# Patient Record
Sex: Female | Born: 1945 | Race: Black or African American | Hispanic: No | Smoking: Never smoker
Health system: Southern US, Community
[De-identification: ages and names within clinical notes are randomized; demographics above are authoritative.]

## PROBLEM LIST (undated history)

## (undated) DIAGNOSIS — Z86010 Personal history of colonic polyps: Secondary | ICD-10-CM

## (undated) DIAGNOSIS — K922 Gastrointestinal hemorrhage, unspecified: Secondary | ICD-10-CM

## (undated) DIAGNOSIS — J9801 Acute bronchospasm: Secondary | ICD-10-CM

## (undated) DIAGNOSIS — M25519 Pain in unspecified shoulder: Secondary | ICD-10-CM

## (undated) DIAGNOSIS — J0130 Acute sphenoidal sinusitis, unspecified: Secondary | ICD-10-CM

## (undated) DIAGNOSIS — R011 Cardiac murmur, unspecified: Secondary | ICD-10-CM

## (undated) DIAGNOSIS — R945 Abnormal results of liver function studies: Secondary | ICD-10-CM

## (undated) DIAGNOSIS — I10 Essential (primary) hypertension: Secondary | ICD-10-CM

## (undated) DIAGNOSIS — R946 Abnormal results of thyroid function studies: Secondary | ICD-10-CM

## (undated) DIAGNOSIS — L910 Hypertrophic scar: Secondary | ICD-10-CM

## (undated) DIAGNOSIS — R1011 Right upper quadrant pain: Secondary | ICD-10-CM

## (undated) DIAGNOSIS — E119 Type 2 diabetes mellitus without complications: Secondary | ICD-10-CM

## (undated) DIAGNOSIS — L089 Local infection of the skin and subcutaneous tissue, unspecified: Secondary | ICD-10-CM

## (undated) DIAGNOSIS — M7989 Other specified soft tissue disorders: Secondary | ICD-10-CM

## (undated) DIAGNOSIS — G4733 Obstructive sleep apnea (adult) (pediatric): Secondary | ICD-10-CM

## (undated) DIAGNOSIS — M1990 Unspecified osteoarthritis, unspecified site: Secondary | ICD-10-CM

## (undated) DIAGNOSIS — K8071 Calculus of gallbladder and bile duct without cholecystitis with obstruction: Secondary | ICD-10-CM

## (undated) DIAGNOSIS — E782 Mixed hyperlipidemia: Secondary | ICD-10-CM

## (undated) DIAGNOSIS — G4730 Sleep apnea, unspecified: Secondary | ICD-10-CM

## (undated) DIAGNOSIS — I517 Cardiomegaly: Secondary | ICD-10-CM

## (undated) DIAGNOSIS — K3533 Acute appendicitis with perforation and localized peritonitis, with abscess: Secondary | ICD-10-CM

## (undated) DIAGNOSIS — E669 Obesity, unspecified: Secondary | ICD-10-CM

## (undated) HISTORY — DX: Pain in unspecified shoulder: M25.519

## (undated) HISTORY — DX: Acute appendicitis with perforation and localized peritonitis, with abscess: K35.33

## (undated) HISTORY — DX: Mixed hyperlipidemia: E78.2

## (undated) HISTORY — DX: Local infection of the skin and subcutaneous tissue, unspecified: L08.9

## (undated) HISTORY — DX: Gastrointestinal hemorrhage, unspecified: K92.2

## (undated) HISTORY — DX: Cardiomegaly: I51.7

## (undated) HISTORY — DX: Obesity, unspecified: E66.9

## (undated) HISTORY — DX: Obstructive sleep apnea (adult) (pediatric): G47.33

## (undated) HISTORY — DX: Right upper quadrant pain: R10.11

## (undated) HISTORY — DX: Acute bronchospasm: J98.01

## (undated) HISTORY — DX: Other specified soft tissue disorders: M79.89

## (undated) HISTORY — DX: Acute sphenoidal sinusitis, unspecified: J01.30

## (undated) HISTORY — DX: Calculus of gallbladder and bile duct without cholecystitis with obstruction: K80.71

## (undated) HISTORY — PX: OTHER SURGICAL HISTORY: SHX169

## (undated) HISTORY — DX: Personal history of colonic polyps: Z86.010

## (undated) HISTORY — DX: Unspecified osteoarthritis, unspecified site: M19.90

## (undated) HISTORY — PX: EYE SURGERY: SHX253

## (undated) HISTORY — DX: Type 2 diabetes mellitus without complications: E11.9

## (undated) HISTORY — DX: Essential (primary) hypertension: I10

## (undated) HISTORY — DX: Sleep apnea, unspecified: G47.30

## (undated) HISTORY — DX: Abnormal results of liver function studies: R94.5

## (undated) HISTORY — PX: ABDOMINAL HYSTERECTOMY: SHX81

## (undated) HISTORY — DX: Abnormal results of thyroid function studies: R94.6

## (undated) HISTORY — DX: Morbid (severe) obesity due to excess calories: E66.01

## (undated) HISTORY — DX: Hypertrophic scar: L91.0

## (undated) HISTORY — DX: Cardiac murmur, unspecified: R01.1

---

## 1993-09-10 ENCOUNTER — Encounter: Payer: Self-pay | Admitting: Pulmonary Disease

## 1998-11-16 ENCOUNTER — Encounter: Admission: RE | Admit: 1998-11-16 | Discharge: 1999-02-14 | Payer: Self-pay | Admitting: Internal Medicine

## 1998-11-20 ENCOUNTER — Other Ambulatory Visit: Admission: RE | Admit: 1998-11-20 | Discharge: 1998-11-20 | Payer: Self-pay | Admitting: Internal Medicine

## 1998-11-30 ENCOUNTER — Encounter: Payer: Self-pay | Admitting: Pulmonary Disease

## 1999-01-08 ENCOUNTER — Ambulatory Visit: Admission: RE | Admit: 1999-01-08 | Discharge: 1999-01-08 | Payer: Self-pay | Admitting: Pulmonary Disease

## 1999-01-08 ENCOUNTER — Encounter: Payer: Self-pay | Admitting: Pulmonary Disease

## 1999-02-06 ENCOUNTER — Ambulatory Visit (HOSPITAL_COMMUNITY): Admission: RE | Admit: 1999-02-06 | Discharge: 1999-02-06 | Payer: Self-pay | Admitting: Gastroenterology

## 2000-11-04 ENCOUNTER — Encounter: Admission: RE | Admit: 2000-11-04 | Discharge: 2001-02-02 | Payer: Self-pay | Admitting: Internal Medicine

## 2000-12-16 ENCOUNTER — Encounter: Payer: Self-pay | Admitting: General Surgery

## 2000-12-16 ENCOUNTER — Inpatient Hospital Stay (HOSPITAL_COMMUNITY): Admission: EM | Admit: 2000-12-16 | Discharge: 2000-12-18 | Payer: Self-pay

## 2000-12-17 ENCOUNTER — Encounter: Payer: Self-pay | Admitting: General Surgery

## 2001-02-15 LAB — HM MAMMOGRAPHY

## 2005-02-04 ENCOUNTER — Ambulatory Visit: Payer: Self-pay | Admitting: Internal Medicine

## 2005-02-14 ENCOUNTER — Ambulatory Visit: Payer: Self-pay | Admitting: Internal Medicine

## 2005-03-03 ENCOUNTER — Ambulatory Visit: Payer: Self-pay | Admitting: Internal Medicine

## 2005-04-28 ENCOUNTER — Ambulatory Visit: Payer: Self-pay | Admitting: Internal Medicine

## 2005-05-13 ENCOUNTER — Ambulatory Visit: Payer: Self-pay

## 2005-05-13 ENCOUNTER — Encounter: Payer: Self-pay | Admitting: Cardiology

## 2005-07-15 ENCOUNTER — Ambulatory Visit: Payer: Self-pay | Admitting: Internal Medicine

## 2005-08-25 ENCOUNTER — Ambulatory Visit: Payer: Self-pay | Admitting: Internal Medicine

## 2006-06-28 LAB — HM COLONOSCOPY

## 2006-09-04 ENCOUNTER — Telehealth: Payer: Self-pay | Admitting: Internal Medicine

## 2006-10-07 ENCOUNTER — Telehealth: Payer: Self-pay | Admitting: Internal Medicine

## 2006-12-01 ENCOUNTER — Ambulatory Visit: Payer: Self-pay | Admitting: Internal Medicine

## 2006-12-01 LAB — CONVERTED CEMR LAB
ALT: 17 units/L (ref 0–35)
AST: 15 units/L (ref 0–37)
Basophils Relative: 0.3 % (ref 0.0–1.0)
Bilirubin, Direct: 0.2 mg/dL (ref 0.0–0.3)
CO2: 26 meq/L (ref 19–32)
Calcium: 9.4 mg/dL (ref 8.4–10.5)
Chloride: 106 meq/L (ref 96–112)
Eosinophils Relative: 3.2 % (ref 0.0–5.0)
Glucose, Bld: 126 mg/dL — ABNORMAL HIGH (ref 70–99)
HCT: 37.2 % (ref 36.0–46.0)
Hgb A1c MFr Bld: 6.4 % — ABNORMAL HIGH (ref 4.6–6.0)
Lymphocytes Relative: 27.3 % (ref 12.0–46.0)
Neutrophils Relative %: 61.4 % (ref 43.0–77.0)
Nitrite: NEGATIVE
Platelets: 172 10*3/uL (ref 150–400)
RBC: 4.14 M/uL (ref 3.87–5.11)
Specific Gravity, Urine: 1.025
Total Bilirubin: 0.7 mg/dL (ref 0.3–1.2)
Total CHOL/HDL Ratio: 2.8
Total Protein: 6.7 g/dL (ref 6.0–8.3)
Triglycerides: 117 mg/dL (ref 0–149)
VLDL: 23 mg/dL (ref 0–40)
WBC Urine, dipstick: NEGATIVE
WBC: 6.4 10*3/uL (ref 4.5–10.5)

## 2006-12-08 ENCOUNTER — Ambulatory Visit: Payer: Self-pay | Admitting: Internal Medicine

## 2006-12-08 DIAGNOSIS — E669 Obesity, unspecified: Secondary | ICD-10-CM | POA: Insufficient documentation

## 2006-12-08 DIAGNOSIS — I10 Essential (primary) hypertension: Secondary | ICD-10-CM | POA: Insufficient documentation

## 2006-12-08 DIAGNOSIS — M1990 Unspecified osteoarthritis, unspecified site: Secondary | ICD-10-CM | POA: Insufficient documentation

## 2006-12-08 DIAGNOSIS — Z860100 Personal history of colon polyps, unspecified: Secondary | ICD-10-CM | POA: Insufficient documentation

## 2006-12-08 DIAGNOSIS — Z9989 Dependence on other enabling machines and devices: Secondary | ICD-10-CM

## 2006-12-08 DIAGNOSIS — G4733 Obstructive sleep apnea (adult) (pediatric): Secondary | ICD-10-CM | POA: Insufficient documentation

## 2006-12-08 DIAGNOSIS — E782 Mixed hyperlipidemia: Secondary | ICD-10-CM

## 2006-12-08 DIAGNOSIS — R946 Abnormal results of thyroid function studies: Secondary | ICD-10-CM

## 2006-12-08 DIAGNOSIS — Z6841 Body Mass Index (BMI) 40.0 and over, adult: Secondary | ICD-10-CM | POA: Insufficient documentation

## 2006-12-08 DIAGNOSIS — R011 Cardiac murmur, unspecified: Secondary | ICD-10-CM

## 2006-12-08 DIAGNOSIS — E1169 Type 2 diabetes mellitus with other specified complication: Secondary | ICD-10-CM | POA: Insufficient documentation

## 2006-12-08 DIAGNOSIS — E119 Type 2 diabetes mellitus without complications: Secondary | ICD-10-CM

## 2006-12-08 HISTORY — DX: Type 2 diabetes mellitus without complications: E11.9

## 2006-12-08 HISTORY — DX: Mixed hyperlipidemia: E78.2

## 2006-12-08 HISTORY — DX: Essential (primary) hypertension: I10

## 2006-12-08 HISTORY — DX: Personal history of colonic polyps: Z86.010

## 2006-12-08 HISTORY — DX: Abnormal results of thyroid function studies: R94.6

## 2006-12-08 HISTORY — DX: Obstructive sleep apnea (adult) (pediatric): G47.33

## 2006-12-08 HISTORY — DX: Cardiac murmur, unspecified: R01.1

## 2006-12-08 HISTORY — DX: Morbid (severe) obesity due to excess calories: E66.01

## 2006-12-08 HISTORY — DX: Unspecified osteoarthritis, unspecified site: M19.90

## 2006-12-08 HISTORY — DX: Personal history of colon polyps, unspecified: Z86.0100

## 2006-12-18 ENCOUNTER — Encounter: Payer: Self-pay | Admitting: Internal Medicine

## 2007-04-05 ENCOUNTER — Ambulatory Visit: Payer: Self-pay | Admitting: Internal Medicine

## 2007-04-08 LAB — CONVERTED CEMR LAB
Free T4: 0.8 ng/dL (ref 0.6–1.6)
TSH: 3.58 microintl units/mL (ref 0.35–5.50)
Thyroglobulin Ab: <30 (ref 0.0–60.0)
Thyroperoxidase Ab SerPl-aCnc: 4537.9 — ABNORMAL HIGH (ref 0.0–60.0)

## 2007-08-31 ENCOUNTER — Encounter: Payer: Self-pay | Admitting: Internal Medicine

## 2007-09-29 ENCOUNTER — Encounter: Payer: Self-pay | Admitting: Internal Medicine

## 2008-02-07 ENCOUNTER — Ambulatory Visit: Payer: Self-pay | Admitting: Internal Medicine

## 2008-02-07 DIAGNOSIS — M25519 Pain in unspecified shoulder: Secondary | ICD-10-CM

## 2008-02-07 HISTORY — DX: Pain in unspecified shoulder: M25.519

## 2008-03-07 ENCOUNTER — Telehealth: Payer: Self-pay | Admitting: *Deleted

## 2008-03-28 ENCOUNTER — Encounter: Payer: Self-pay | Admitting: Internal Medicine

## 2008-04-19 ENCOUNTER — Ambulatory Visit: Payer: Self-pay | Admitting: Pulmonary Disease

## 2008-04-24 ENCOUNTER — Telehealth: Payer: Self-pay | Admitting: *Deleted

## 2008-06-16 ENCOUNTER — Inpatient Hospital Stay (HOSPITAL_COMMUNITY): Admission: EM | Admit: 2008-06-16 | Discharge: 2008-06-23 | Payer: Self-pay | Admitting: Emergency Medicine

## 2008-06-16 ENCOUNTER — Ambulatory Visit: Payer: Self-pay | Admitting: Internal Medicine

## 2008-06-16 ENCOUNTER — Ambulatory Visit: Payer: Self-pay | Admitting: Family Medicine

## 2008-06-16 DIAGNOSIS — R1011 Right upper quadrant pain: Secondary | ICD-10-CM | POA: Insufficient documentation

## 2008-06-16 HISTORY — DX: Right upper quadrant pain: R10.11

## 2008-06-16 LAB — CONVERTED CEMR LAB
Glucose, Urine, Semiquant: NEGATIVE
Protein, U semiquant: NEGATIVE
WBC Urine, dipstick: NEGATIVE
pH: 6.5

## 2008-06-18 ENCOUNTER — Encounter: Payer: Self-pay | Admitting: Internal Medicine

## 2008-06-19 ENCOUNTER — Encounter (INDEPENDENT_AMBULATORY_CARE_PROVIDER_SITE_OTHER): Payer: Self-pay | Admitting: Surgery

## 2008-06-27 HISTORY — PX: CHOLECYSTECTOMY: SHX55

## 2008-07-05 ENCOUNTER — Encounter: Payer: Self-pay | Admitting: Internal Medicine

## 2008-07-05 ENCOUNTER — Encounter: Payer: Self-pay | Admitting: *Deleted

## 2008-07-05 LAB — CONVERTED CEMR LAB
Bilirubin, Direct: 0.4 mg/dL
Total Bilirubin: 0.8 mg/dL

## 2008-07-14 ENCOUNTER — Ambulatory Visit (HOSPITAL_COMMUNITY): Admission: RE | Admit: 2008-07-14 | Discharge: 2008-07-14 | Payer: Self-pay | Admitting: Gastroenterology

## 2008-07-14 ENCOUNTER — Encounter: Payer: Self-pay | Admitting: Internal Medicine

## 2008-07-26 ENCOUNTER — Encounter: Payer: Self-pay | Admitting: Internal Medicine

## 2008-07-28 ENCOUNTER — Ambulatory Visit: Payer: Self-pay | Admitting: Internal Medicine

## 2008-07-28 ENCOUNTER — Ambulatory Visit: Payer: Self-pay

## 2008-07-28 DIAGNOSIS — M7989 Other specified soft tissue disorders: Secondary | ICD-10-CM

## 2008-07-28 DIAGNOSIS — R945 Abnormal results of liver function studies: Secondary | ICD-10-CM

## 2008-07-28 DIAGNOSIS — L089 Local infection of the skin and subcutaneous tissue, unspecified: Secondary | ICD-10-CM | POA: Insufficient documentation

## 2008-07-28 HISTORY — DX: Local infection of the skin and subcutaneous tissue, unspecified: L08.9

## 2008-07-28 HISTORY — DX: Other specified soft tissue disorders: M79.89

## 2008-07-28 HISTORY — DX: Abnormal results of liver function studies: R94.5

## 2008-08-01 ENCOUNTER — Telehealth (INDEPENDENT_AMBULATORY_CARE_PROVIDER_SITE_OTHER): Payer: Self-pay | Admitting: *Deleted

## 2008-08-10 ENCOUNTER — Encounter: Payer: Self-pay | Admitting: Internal Medicine

## 2008-08-11 ENCOUNTER — Encounter: Payer: Self-pay | Admitting: *Deleted

## 2008-08-15 ENCOUNTER — Telehealth (INDEPENDENT_AMBULATORY_CARE_PROVIDER_SITE_OTHER): Payer: Self-pay | Admitting: *Deleted

## 2008-09-28 ENCOUNTER — Ambulatory Visit: Payer: Self-pay | Admitting: Internal Medicine

## 2008-09-28 LAB — CONVERTED CEMR LAB
Bilirubin, Direct: 0 mg/dL (ref 0.0–0.3)
LDL Cholesterol: 42 mg/dL (ref 0–99)
TSH: 4.72 microintl units/mL (ref 0.35–5.50)
Total Bilirubin: 0.9 mg/dL (ref 0.3–1.2)
Total CHOL/HDL Ratio: 2
VLDL: 20.4 mg/dL (ref 0.0–40.0)

## 2008-10-05 ENCOUNTER — Ambulatory Visit: Payer: Self-pay | Admitting: Internal Medicine

## 2008-10-05 DIAGNOSIS — L910 Hypertrophic scar: Secondary | ICD-10-CM | POA: Insufficient documentation

## 2008-10-05 HISTORY — DX: Hypertrophic scar: L91.0

## 2009-02-07 ENCOUNTER — Telehealth: Payer: Self-pay | Admitting: *Deleted

## 2009-02-08 ENCOUNTER — Ambulatory Visit: Payer: Self-pay | Admitting: Internal Medicine

## 2009-02-08 LAB — CONVERTED CEMR LAB
CO2: 29 meq/L (ref 19–32)
Calcium: 9.2 mg/dL (ref 8.4–10.5)
Chloride: 98 meq/L (ref 96–112)
Free T4: 0.8 ng/dL (ref 0.6–1.6)
Hgb A1c MFr Bld: 6.7 % — ABNORMAL HIGH (ref 4.6–6.5)
Sodium: 137 meq/L (ref 135–145)
TSH: 4.58 microintl units/mL (ref 0.35–5.50)

## 2009-02-15 ENCOUNTER — Ambulatory Visit: Payer: Self-pay | Admitting: Internal Medicine

## 2009-04-18 ENCOUNTER — Telehealth: Payer: Self-pay | Admitting: *Deleted

## 2009-05-01 ENCOUNTER — Ambulatory Visit: Payer: Self-pay | Admitting: Pulmonary Disease

## 2009-05-03 ENCOUNTER — Ambulatory Visit: Payer: Self-pay | Admitting: Internal Medicine

## 2009-05-10 ENCOUNTER — Ambulatory Visit: Payer: Self-pay | Admitting: Internal Medicine

## 2009-06-07 ENCOUNTER — Encounter: Payer: Self-pay | Admitting: Internal Medicine

## 2009-06-15 ENCOUNTER — Telehealth: Payer: Self-pay | Admitting: *Deleted

## 2009-08-02 ENCOUNTER — Ambulatory Visit: Payer: Self-pay | Admitting: Internal Medicine

## 2009-08-02 ENCOUNTER — Telehealth: Payer: Self-pay | Admitting: Internal Medicine

## 2009-08-02 LAB — CONVERTED CEMR LAB
Hgb A1c MFr Bld: 6.8 % — ABNORMAL HIGH (ref 4.6–6.5)
Microalb, Ur: 0.4 mg/dL (ref 0.0–1.9)

## 2009-08-24 ENCOUNTER — Ambulatory Visit: Payer: Self-pay | Admitting: Internal Medicine

## 2009-08-24 DIAGNOSIS — J9801 Acute bronchospasm: Secondary | ICD-10-CM | POA: Insufficient documentation

## 2009-08-24 HISTORY — DX: Acute bronchospasm: J98.01

## 2009-08-29 ENCOUNTER — Telehealth: Payer: Self-pay | Admitting: *Deleted

## 2009-08-31 ENCOUNTER — Telehealth: Payer: Self-pay | Admitting: Internal Medicine

## 2009-09-24 ENCOUNTER — Ambulatory Visit: Payer: Self-pay | Admitting: Internal Medicine

## 2009-09-24 DIAGNOSIS — E669 Obesity, unspecified: Secondary | ICD-10-CM | POA: Insufficient documentation

## 2009-09-24 HISTORY — DX: Obesity, unspecified: E66.9

## 2009-09-27 ENCOUNTER — Ambulatory Visit: Payer: Self-pay | Admitting: Internal Medicine

## 2009-09-27 IMAGING — RF DG ERCP WO/W SPHINCTEROTOMY
1 series · 9 of 9 positions shown · IV contrast (agent unspecified)
Comparison: Operative cholangiogram 06/19/2008

CLINICAL DATA: Possible common bile duct stones

ERCP
Fluoroscopy Time: Not recorded
Contrast: Unknown

[Series 1: run · 9 of 9 slices shown]
[im 1/9]
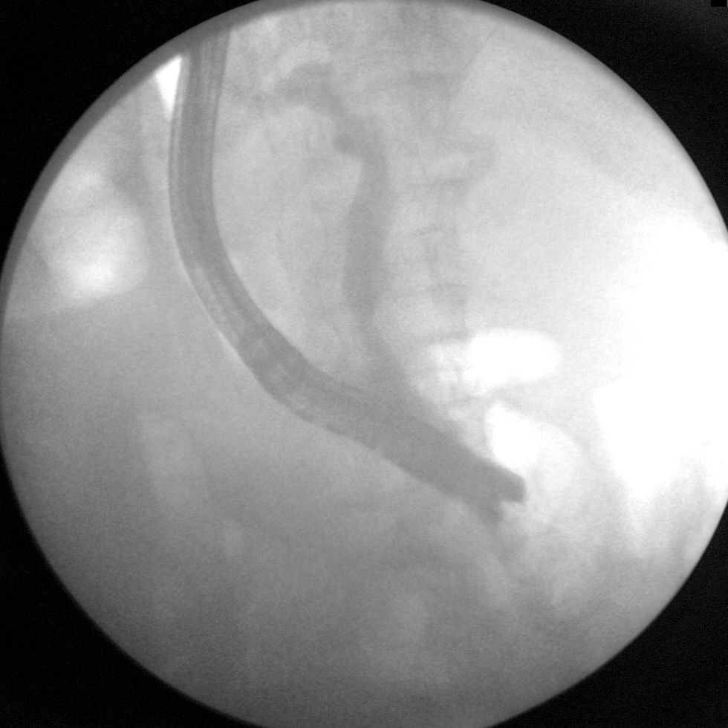
[im 2/9]
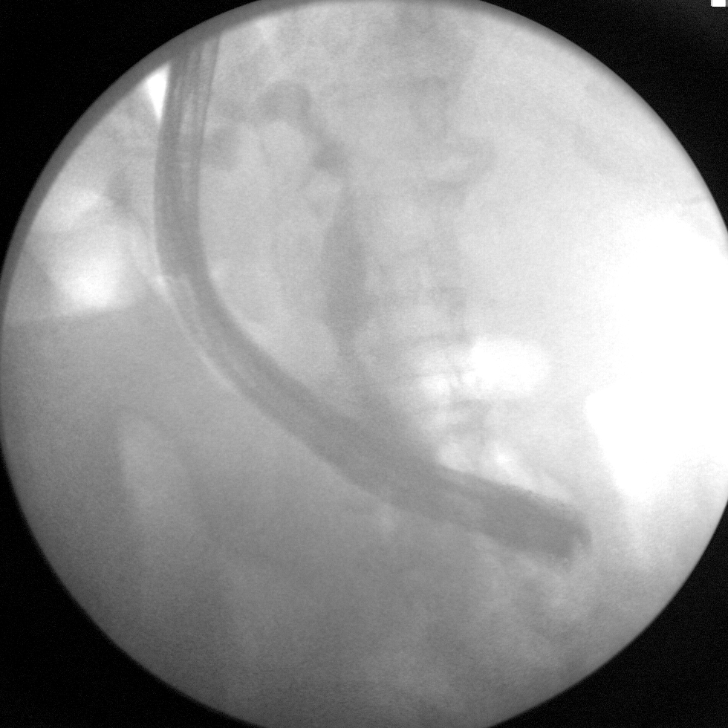
[im 3/9]
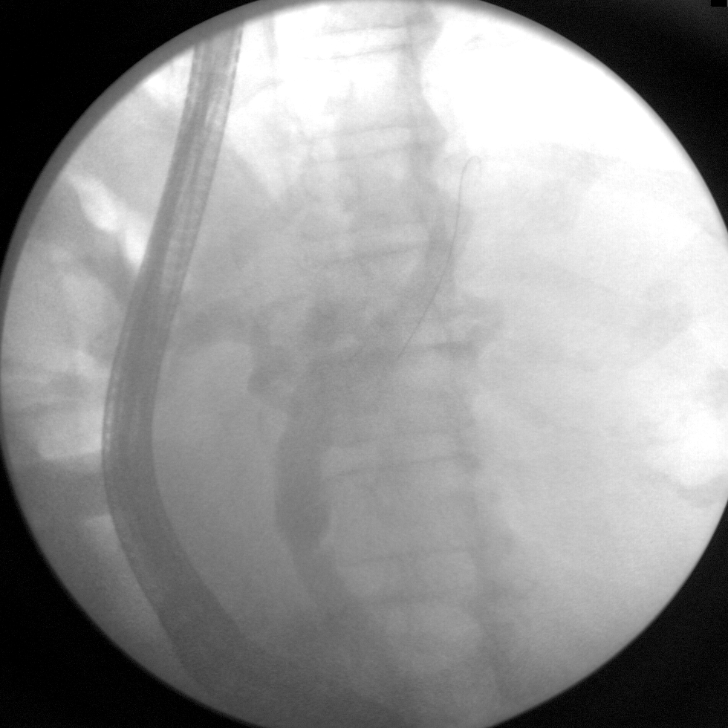
[im 4/9]
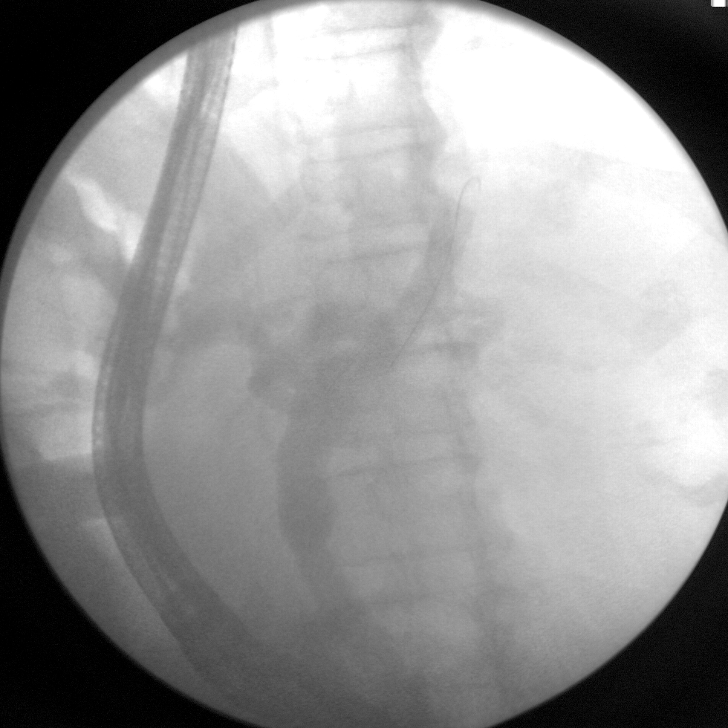
[im 5/9]
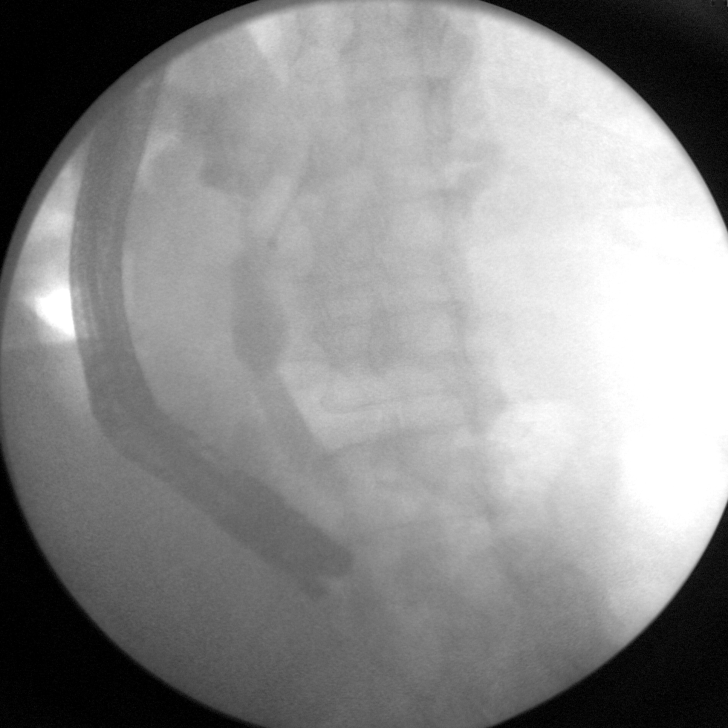
[im 6/9]
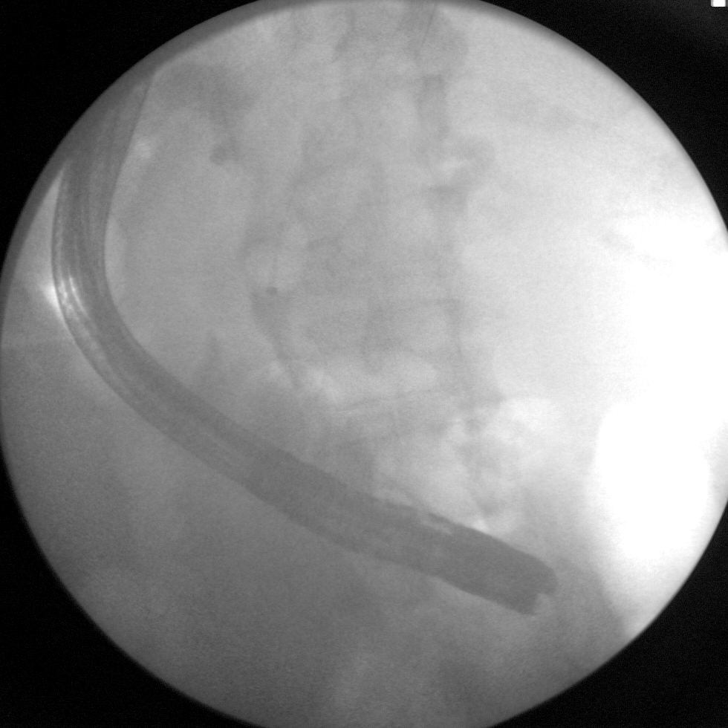
[im 7/9]
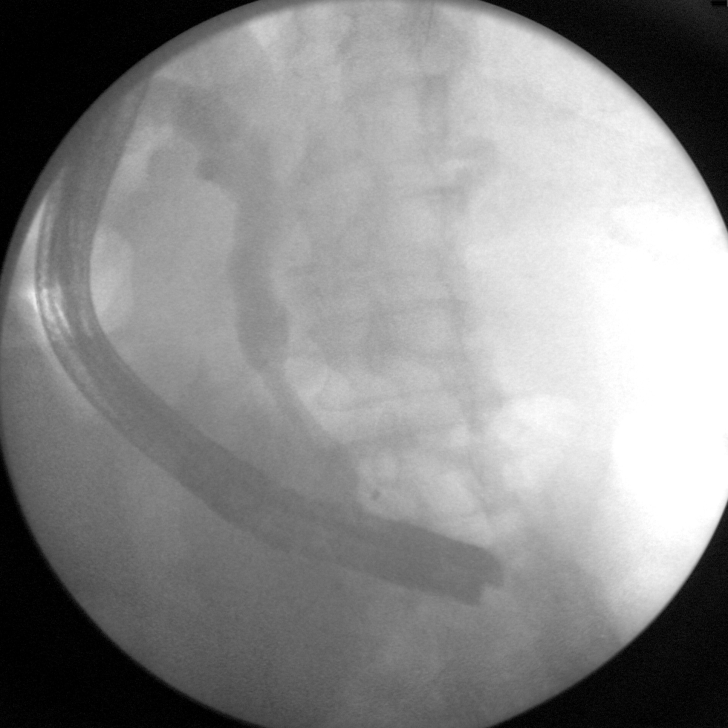
[im 8/9]
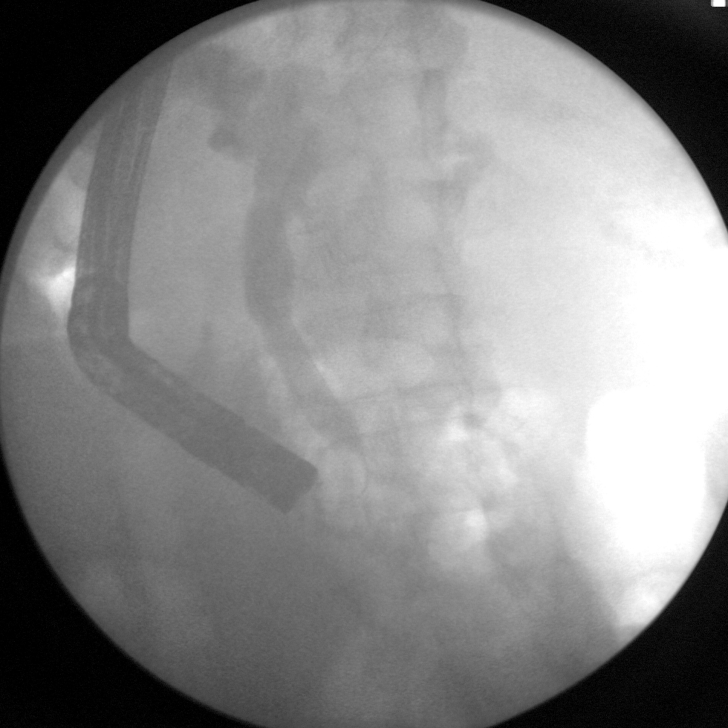
[im 9/9]
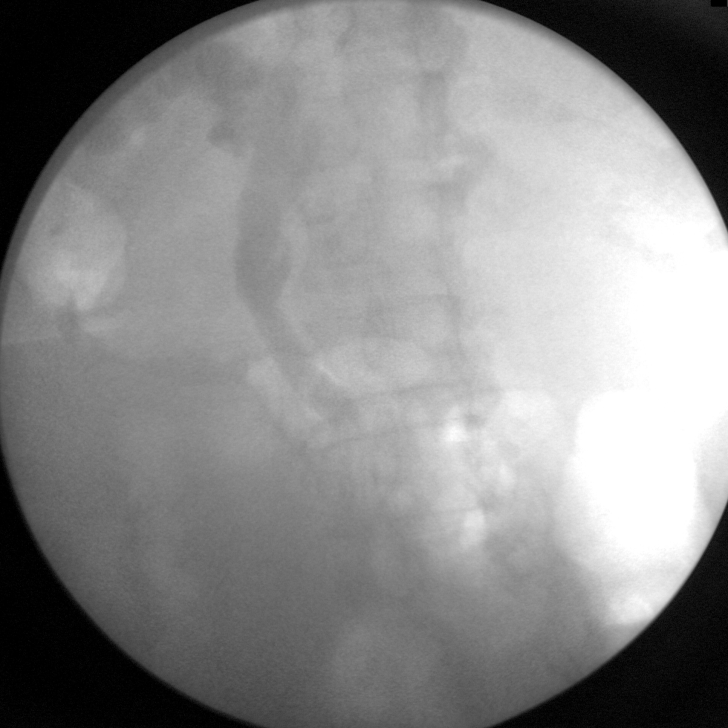

[9 of 9 positions shown; findings below may reference images not displayed]

FINDINGS: Fluoroscopy was provided for endoscopy with
sphincterotomy and balloon sweeping of the duct.  A radiologist was
not present at the time of the exam.
IMPRESSION: ERCP with sphincterotomy and common bile duct sweeping.

## 2009-10-02 ENCOUNTER — Ambulatory Visit (HOSPITAL_COMMUNITY): Admission: RE | Admit: 2009-10-02 | Discharge: 2009-10-02 | Payer: Self-pay | Admitting: Internal Medicine

## 2009-10-02 ENCOUNTER — Ambulatory Visit: Payer: Self-pay

## 2009-10-02 ENCOUNTER — Encounter: Payer: Self-pay | Admitting: Internal Medicine

## 2009-10-02 ENCOUNTER — Ambulatory Visit: Payer: Self-pay | Admitting: Internal Medicine

## 2009-11-20 ENCOUNTER — Ambulatory Visit: Payer: Self-pay | Admitting: Family Medicine

## 2009-11-30 ENCOUNTER — Telehealth: Payer: Self-pay | Admitting: *Deleted

## 2010-01-18 ENCOUNTER — Telehealth: Payer: Self-pay | Admitting: *Deleted

## 2010-01-31 ENCOUNTER — Encounter: Payer: Self-pay | Admitting: Internal Medicine

## 2010-01-31 DIAGNOSIS — I517 Cardiomegaly: Secondary | ICD-10-CM

## 2010-01-31 HISTORY — DX: Cardiomegaly: I51.7

## 2010-02-01 ENCOUNTER — Ambulatory Visit
Admission: RE | Admit: 2010-02-01 | Discharge: 2010-02-01 | Payer: Self-pay | Source: Home / Self Care | Attending: Internal Medicine | Admitting: Internal Medicine

## 2010-02-01 ENCOUNTER — Other Ambulatory Visit: Payer: Self-pay | Admitting: Internal Medicine

## 2010-02-01 LAB — HEPATIC FUNCTION PANEL
ALT: 122 U/L — ABNORMAL HIGH (ref 0–35)
AST: 50 U/L — ABNORMAL HIGH (ref 0–37)
Albumin: 3.3 g/dL — ABNORMAL LOW (ref 3.5–5.2)
Alkaline Phosphatase: 220 U/L — ABNORMAL HIGH (ref 39–117)
Bilirubin, Direct: 0.7 mg/dL — ABNORMAL HIGH (ref 0.0–0.3)
Total Bilirubin: 1.5 mg/dL — ABNORMAL HIGH (ref 0.3–1.2)
Total Protein: 6.5 g/dL (ref 6.0–8.3)

## 2010-02-01 LAB — BASIC METABOLIC PANEL
BUN: 18 mg/dL (ref 6–23)
CO2: 30 mEq/L (ref 19–32)
Calcium: 9.3 mg/dL (ref 8.4–10.5)
Chloride: 105 mEq/L (ref 96–112)
Creatinine, Ser: 1.1 mg/dL (ref 0.4–1.2)
GFR: 65.82 mL/min (ref >60.00)
Glucose, Bld: 142 mg/dL — ABNORMAL HIGH (ref 70–99)
Potassium: 3.4 mEq/L — ABNORMAL LOW (ref 3.5–5.1)
Sodium: 142 mEq/L (ref 135–145)

## 2010-02-01 LAB — HEMOGLOBIN A1C: Hgb A1c MFr Bld: 6.8 % — ABNORMAL HIGH (ref 4.6–6.5)

## 2010-02-01 LAB — LIPID PANEL
Cholesterol: 128 mg/dL (ref 0–200)
HDL: 44 mg/dL (ref >39.00)
LDL Cholesterol: 64 mg/dL (ref 0–99)
Total CHOL/HDL Ratio: 3
Triglycerides: 100 mg/dL (ref 0.0–149.0)
VLDL: 20 mg/dL (ref 0.0–40.0)

## 2010-02-01 LAB — TSH: TSH: 6.17 u[IU]/mL — ABNORMAL HIGH (ref 0.35–5.50)

## 2010-02-11 ENCOUNTER — Ambulatory Visit
Admission: RE | Admit: 2010-02-11 | Discharge: 2010-02-11 | Payer: Self-pay | Source: Home / Self Care | Attending: Internal Medicine | Admitting: Internal Medicine

## 2010-02-19 ENCOUNTER — Emergency Department (HOSPITAL_COMMUNITY)
Admission: EM | Admit: 2010-02-19 | Discharge: 2010-02-19 | Payer: Self-pay | Source: Home / Self Care | Admitting: Emergency Medicine

## 2010-02-19 ENCOUNTER — Telehealth: Payer: Self-pay | Admitting: *Deleted

## 2010-02-19 ENCOUNTER — Telehealth: Payer: Self-pay | Admitting: Internal Medicine

## 2010-02-20 ENCOUNTER — Telehealth: Payer: Self-pay | Admitting: Internal Medicine

## 2010-02-20 ENCOUNTER — Ambulatory Visit: Admit: 2010-02-20 | Payer: Self-pay | Admitting: Internal Medicine

## 2010-02-20 LAB — URINE MICROSCOPIC-ADD ON

## 2010-02-20 LAB — DIFFERENTIAL
Eosinophils Absolute: 0 10*3/uL (ref 0.0–0.7)
Lymphs Abs: 0.9 10*3/uL (ref 0.7–4.0)
Monocytes Relative: 5 % (ref 3–12)
Neutrophils Relative %: 83 % — ABNORMAL HIGH (ref 43–77)

## 2010-02-20 LAB — HEPATIC FUNCTION PANEL
ALT: 138 U/L — ABNORMAL HIGH (ref 0–35)
Alkaline Phosphatase: 358 U/L — ABNORMAL HIGH (ref 39–117)
Indirect Bilirubin: 2 mg/dL — ABNORMAL HIGH (ref 0.3–0.9)
Total Bilirubin: 4.1 mg/dL — ABNORMAL HIGH (ref 0.3–1.2)
Total Protein: 7.1 g/dL (ref 6.0–8.3)

## 2010-02-20 LAB — COMPREHENSIVE METABOLIC PANEL
ALT: 138 U/L — ABNORMAL HIGH (ref 0–35)
AST: 67 U/L — ABNORMAL HIGH (ref 0–37)
Albumin: 3.1 g/dL — ABNORMAL LOW (ref 3.5–5.2)
Alkaline Phosphatase: 355 U/L — ABNORMAL HIGH (ref 39–117)
Chloride: 105 mEq/L (ref 96–112)
GFR calc Af Amer: >60 mL/min (ref >60)
Potassium: 3.4 mEq/L — ABNORMAL LOW (ref 3.5–5.1)
Total Bilirubin: 4.3 mg/dL — ABNORMAL HIGH (ref 0.3–1.2)

## 2010-02-20 LAB — GAMMA GT: GGT: 451 U/L — ABNORMAL HIGH (ref 7–51)

## 2010-02-20 LAB — URINALYSIS, ROUTINE W REFLEX MICROSCOPIC
Hgb urine dipstick: NEGATIVE
Protein, ur: 30 mg/dL — AB
Urobilinogen, UA: 1 mg/dL (ref 0.0–1.0)

## 2010-02-20 LAB — CBC
MCH: 29 pg (ref 26.0–34.0)
MCV: 88.1 fL (ref 78.0–100.0)
Platelets: 159 10*3/uL (ref 150–400)
RBC: 4.21 MIL/uL (ref 3.87–5.11)

## 2010-02-20 LAB — POCT CARDIAC MARKERS: Troponin i, poc: <0.05 ng/mL (ref 0.00–0.09)

## 2010-02-21 ENCOUNTER — Encounter: Payer: Self-pay | Admitting: Internal Medicine

## 2010-02-24 LAB — CONVERTED CEMR LAB
AST: 24 units/L (ref 0–37)
Albumin: 3.6 g/dL (ref 3.5–5.2)
Creatinine,U: 96.1 mg/dL
Eosinophils Relative: 6.3 % — ABNORMAL HIGH (ref 0.0–5.0)
HCT: 35.3 % — ABNORMAL LOW (ref 36.0–46.0)
Lymphs Abs: 1.7 10*3/uL (ref 0.7–4.0)
Microalb Creat Ratio: 3.1 mg/g (ref 0.0–30.0)
Microalb, Ur: 0.3 mg/dL (ref 0.0–1.9)
Monocytes Relative: 10 % (ref 3.0–12.0)
Neutrophils Relative %: 44.7 % (ref 43.0–77.0)
Platelets: 156 10*3/uL (ref 150.0–400.0)
WBC: 4.5 10*3/uL (ref 4.5–10.5)

## 2010-02-25 ENCOUNTER — Encounter: Payer: Self-pay | Admitting: Gastroenterology

## 2010-02-26 ENCOUNTER — Other Ambulatory Visit: Payer: Self-pay | Admitting: Internal Medicine

## 2010-02-26 ENCOUNTER — Ambulatory Visit
Admission: RE | Admit: 2010-02-26 | Discharge: 2010-02-26 | Payer: Self-pay | Source: Home / Self Care | Attending: Internal Medicine | Admitting: Internal Medicine

## 2010-02-26 DIAGNOSIS — R42 Dizziness and giddiness: Secondary | ICD-10-CM | POA: Insufficient documentation

## 2010-02-26 DIAGNOSIS — J0130 Acute sphenoidal sinusitis, unspecified: Secondary | ICD-10-CM | POA: Insufficient documentation

## 2010-02-26 HISTORY — DX: Acute sphenoidal sinusitis, unspecified: J01.30

## 2010-02-26 LAB — CONVERTED CEMR LAB
HCV Ab: NEGATIVE
Hep B Core Total Ab: NEGATIVE
Hepatitis B Surface Ag: NEGATIVE

## 2010-02-26 LAB — TSH: TSH: 2.74 u[IU]/mL (ref 0.35–5.50)

## 2010-02-26 LAB — HEPATIC FUNCTION PANEL
AST: 57 U/L — ABNORMAL HIGH (ref 0–37)
Albumin: 3.4 g/dL — ABNORMAL LOW (ref 3.5–5.2)
Total Protein: 7 g/dL (ref 6.0–8.3)

## 2010-02-26 NOTE — Progress Notes (Signed)
Summary: exercise clearance  Phone Note Call from Patient   Caller: Patient Call For: Madelin Headings MD Summary of Call: Pt needs an order written on prescription pad stating she is cleared to participate in Water aerobic, and floor exercise in the gym .  Call when ready, please. [phone] Initial call taken by: Lynann Beaver CMA,  Jun 15, 2009 2:54 PM  Follow-up for Phone Call        ok  Follow-up by: Madelin Headings MD,  Jun 15, 2009 4:51 PM  Additional Follow-up for Phone Call Additional follow up Details #1::        Pt aware that the note is ready to pick up. Additional Follow-up by: Romualdo Bolk, CMA (AAMA),  Jun 15, 2009 4:57 PM

## 2010-02-26 NOTE — Progress Notes (Signed)
Summary: ok to drive myself 5 hr  Phone Note Call from Patient Call back at [facility] Phone [phone]   Summary of Call: Is it okay for me to drive 5hrs to [place], [place] by myself?   Leaving Sun night around 5-6.  Not to be out in daytime because of breathing.   Initial call taken by: Rudy Jew, RN,  August 31, 2009 3:00 PM  Follow-up for Phone Call        if she is doing well    and not having dyspnea.   Follow-up by: Madelin Headings MD,  August 31, 2009 3:48 PM  Additional Follow-up for Phone Call Additional follow up Details #1::        Phone Call Completed Additional Follow-up by: Rudy Jew, RN,  August 31, 2009 4:20 PM

## 2010-02-26 NOTE — Assessment & Plan Note (Signed)
Summary: rov for osa   Copy to:  Berniece Andreas Primary Provider/Referring Provider:  Madelin Headings MD  CC:  OSA follow-up.  The patient says she is doing well on CPAP and sleeps approx 8-12 hours every night with it.Marland Kitchen  History of Present Illness: The pt comes in today for f/u of her osa.  She has been wearing cpap compliantly, and report no issues with mask or pressure except mild leak since she has lost weight.  Her weight is down 20 pounds since her last visit by our scales.  She feels she is sleeping well, and is satisfied with her daytime alertness.  Current Medications (verified): 1)  Hydrochlorothiazide 25 Mg Tabs (Hydrochlorothiazide) .Marland Kitchen.. 1 By Mouth Once Daily 2)  Ramipril 10 Mg Caps (Ramipril) .Marland Kitchen.. 1 By Mouth Once Daily 3)  Lipitor 10 Mg  Tabs (Atorvastatin Calcium) .Marland Kitchen.. 1 By Mouth Once Daily 4)  Centrum Silver  Tabs (Multiple Vitamins-Minerals) 5)  Vitamin D 1000 Unit Tabs (Cholecalciferol)  Allergies (verified): No Known Drug Allergies  Review of Systems      See HPI  Vital Signs:  Patient profile:   65 year old female Menstrual status:  hysterectomy Height:      62.25 inches (158.12 cm) Weight:      298 pounds (135.45 kg) BMI:     54.26 O2 Sat:      98 % on Room air Temp:     98.1 degrees F (36.72 degrees C) oral Pulse rate:   75 / minute BP sitting:   126 / 80  (left arm) Cuff size:   large  Vitals Entered By: Michel Bickers CMA (May 01, 2009 10:17 AM)  O2 Sat at Rest %:  98 O2 Flow:  Room air  Physical Exam  General:  obese female in nad Nose:  no skin breakdown or pressure necrosis from cpap mask Neurologic:  alert, not sleepy, moves all 4.   Impression & Recommendations:  Problem # 1:  OBSTRUCTIVE SLEEP APNEA (ICD-327.23)  the pt is doing very well on cpap, and has lost 20 pounds since her visit last year.  I have asked her to continue with weight loss, and to let me know if any issues with her cpap.  She does need to get her mask refitted since she  has lost weight.  Other Orders: Est. Patient Level II (84132)  Patient Instructions: 1)  continue to work on weight loss 2)  get your cpap mask refitted  3)  followup with me in 12mos.

## 2010-02-26 NOTE — Miscellaneous (Signed)
Summary: Appointment Canceled  Appointment status changed to canceled by [employer] on 09/28/2009 11:26 AM.  Cancellation Comments --------------------- echo/ appt is 12:15/ dx: enlarge heart on xray. pt has medicare, bcbs no precret  Appointment Information ----------------------- Appt Type:  CARDIOLOGY ANCILLARY VISIT      Date:  Tuesday, October 02, 2009      Time:  11:30 AM for 60 min   Urgency:  Routine   Made By:  Pearson Grippe  To Visit:  [id number]    Reason:  echo/ appt is 12:15/ dx: enlarge heart on xray. pt has medicare, bcbs no precret  Appt Comments ------------- -- 09/28/09 11:26: ([facility]) CANCELED -- echo/ appt is 12:15/ dx: enlarge heart on xray. pt has medicare, bcbs no precret -- 09/27/09 13:51: ([facility]) BOOKED -- Routine CARDIOLOGY ANCILLARY VISIT at 10/02/2009 11:30 AM for 60 min echo/ appt is 12:15/ dx: enlarg

## 2010-02-26 NOTE — Assessment & Plan Note (Signed)
Summary: 3 month rov/njr pt rsc/njr   Vital Signs:  Patient profile:   65 year old female Menstrual status:  hysterectomy Weight:      298 pounds O2 Sat:      98 % on Room air Temp:     99.5 degrees F oral Pulse rate:   80 / minute BP sitting:   120 / 70  (left arm) Cuff size:   large  Vitals Entered By: Romualdo Bolk, CMA (AAMA) (August 24, 2009 10:17 AM)  O2 Flow:  Room air CC: Follow-up visit on labs, Hypertension Management, coughing x 2 weeks, fever off and on, sob   History of Present Illness: Rebecca Rich comes in today  for follow up of  multiple medical problems .  However over the last 2 weeks  she has had a  prglem with a progressive  cough  and  taking Cough med  and still    temp 100 .3 off and on.      Feels tired and maliase and now som sob.   feels like in throat      not deep cough.  NO NVD . No cp. Neice had a cough that resolved last month. NO tobacco or ets . .   DM : doing ok   no change . HT no change in meds .  Hypertension History:      She complains of dyspnea with exertion and orthopnea, but denies headache, chest pain, palpitations, PND, peripheral edema, visual symptoms, neurologic problems, syncope, and side effects from treatment.  She notes no problems with any antihypertensive medication side effects.        Positive major cardiovascular risk factors include female age 33 years old or older, diabetes, hyperlipidemia, and hypertension.  Negative major cardiovascular risk factors include non-tobacco-user status.      Preventive Screening-Counseling & Management  Alcohol-Tobacco     Alcohol drinks/day: 0     Smoking Status: never     Passive Smoke Exposure: no  Caffeine-Diet-Exercise     Caffeine use/day: <1     Does Patient Exercise: yes     Type of exercise: walking     Exercise (avg: min/session): 15     Times/week: 3  Current Medications (verified): 1)  Hydrochlorothiazide 25 Mg Tabs (Hydrochlorothiazide) .Marland Kitchen.. 1 By Mouth Once  Daily 2)  Ramipril 10 Mg Caps (Ramipril) .Marland Kitchen.. 1 By Mouth Once Daily 3)  Lipitor 10 Mg  Tabs (Atorvastatin Calcium) .Marland Kitchen.. 1 By Mouth Once Daily 4)  Centrum Silver  Tabs (Multiple Vitamins-Minerals) 5)  Vitamin D 1000 Unit Tabs (Cholecalciferol)  Allergies (verified): No Known Drug Allergies  Past History:  Past medical, surgical, family and social histories (including risk factors) reviewed, and no changes noted (except as noted below).  Past Medical History: Reviewed history from 10/05/2008 and no changes required. Diabetes mellitus, type II Hypertension Osteoarthritis OSA Colonic polyps, hx of Echo lv nl mild ai calcium mv 2007   Keloids   Consults Dr. Luciana Axe  eye check Dr. Allen Derry Pulmonary Dr. Loreta Ave for GI care  Past Surgical History: Reviewed history from 07/28/2008 and no changes required. Hysterectomy  gets regular colonoscopies  Gallbladder June 2010  Past History:  Care Management: Surgery: Ezzard Standing Gastroenterology: Elnoria Howard Pulmonary: [facility] Podiatry: Cicero Duck  Family History: Reviewed history from 07/28/2008 and no changes required. Family History Diabetes 1st degree relatives sibs parent  nephew dm and dialysis   heart disease: 3 brothers, 1 sister cancer: father (throat), mother (cervical) 1 sister  had a stroke.   Father: Throat cancer Mother: Cervical Cancer, blood clots Siblings: Sister-stroke, all had dm, heart attacks-brothers, Sister-heart attack, another sister had DM Pt has 4 brother and 5 sisters  Social History: Reviewed history from 04/19/2008 and no changes required. 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 Alcohol use-no Drug use-no pt is divorced with children.    Passive Smoke Exposure:  no  Review of Systems       The patient complains of anorexia, dyspnea on exertion, and prolonged cough.  The patient denies peripheral edema, headaches, hemoptysis, muscle weakness, transient blindness,  difficulty walking, depression, abnormal bleeding, and enlarged lymph nodes.         achy    Physical Exam  General:  alert, well-developed, well-nourished, and well-hydrated.  dry   superficial cough   somewhat breathless.   Head:  normocephalic and atraumatic.   Eyes:  vision grossly intact and pupils equal.   Ears:  R ear normal and L ear normal.  wax in right  Nose:  no external deformity, no external erythema, and no nasal discharge.   Mouth:  pharynx pink and moist.   no drainage Neck:  No deformities, masses, or tenderness noted. Lungs:  no intercostal retractions and no accessory muscle use.  equal but decrease bs  no rales or rhonchi   after nebulizer albuterol  increase airmovement and cough decrease and feels alot bette r Heart:  normal rate, regular rhythm, no gallop, and no lifts.   Abdomen:  soft, non-tender, normal bowel sounds, no masses, no hepatomegaly, and no splenomegaly.   Extremities:  no clubbing cyanosis or edema  Neurologic:  non focal  Cervical Nodes:  No lymphadenopathy noted Psych:  Oriented X3, good eye contact, not anxious appearing, and not depressed appearing.     Impression & Recommendations:  Problem # 1:  COUGH (ICD-786.2) Assessment New with bronchospasm  some sob / if ace contributing  actis like asthma      great response to neb   .   never had this and ? cause .  ? viral but didnt act like this poss inside house pray for insect but hat was a month ago.       alos is code orange for 2 days.  No dx of asthma but use a n inhaler one hyears ago.    check x ray today to r/o pneumonia  with her ? hx of fever ( doubt)   begin   meds   and  Discussed risk benefit  of pred if needed.  If still a problem at [facility] may need to temorarily change her ace Inhibitor.   Orders: T-2 View CXR (71020TC)  Problem # 2:  DIABETES MELLITUS, TYPE II (ICD-250.00) adequate control .  no changes   Problem # 3:  HYPERTENSION (ICD-401.9)  Her updated medication list for  this problem includes:    Hydrochlorothiazide 25 Mg Tabs (Hydrochlorothiazide) .Marland Kitchen... 1 by mouth once daily    Ramipril 10 Mg Caps (Ramipril) .Marland Kitchen... 1 by mouth once daily  BP today: 120/70 Prior BP: 124/80 (05/10/2009)  Prior 10 Yr Risk Heart Disease: 15 % (05/10/2009)  Labs Reviewed: K+: 3.9 (02/08/2009) Creat: : 1.0 (02/08/2009)   Chol: 108 (09/28/2008)   HDL: 45.80 (09/28/2008)   LDL: 42 (09/28/2008)   TG: 102.0 (09/28/2008)  Problem # 4:  ACUTE BRONCHOSPASM (ICD-519.11) Assessment: Comment Only  seea above   Orders: Nebulizer Tx (16109)  Complete Medication List:  1)  Hydrochlorothiazide 25 Mg Tabs (Hydrochlorothiazide) .Marland Kitchen.. 1 by mouth once daily 2)  Ramipril 10 Mg Caps (Ramipril) .Marland Kitchen.. 1 by mouth once daily 3)  Lipitor 10 Mg Tabs (Atorvastatin calcium) .Marland Kitchen.. 1 by mouth once daily 4)  Centrum Silver Tabs (Multiple vitamins-minerals) 5)  Vitamin D 1000 Unit Tabs (Cholecalciferol) 6)  Prednisone 20 Mg Tabs (Prednisone) .... Take 3 by mouth each day for 2 days then 2  each day  for 3 days  or as directed . 7)  Proventil Hfa 108 (90 Base) Mcg/act Aers (Albuterol sulfate) .... 2 puffs every  6 hours as needed for wheezing 8)  Symbicort 160-4.5 Mcg/act Aero (Budesonide-formoterol fumarate) .... 2 puffs two times a day as directed  Hypertension Assessment/Plan:      The patient's hypertensive risk group is category C: Target organ damage and/or diabetes.  Her calculated 10 year risk of coronary heart disease is 15 %.  Today's blood pressure is 120/70.  Her blood pressure goal is < 130/80.  Patient Instructions: 1)  get chest x ray  today  to make sure no  lung abnormalities . 2)  use proventil as needed. 3)  begin symbicort in the meantime 2 puffs two times a day  4)  if not controlling wheezng can take he 5 days of prednisone . 5)  This can tempoorarily increase your blood sugars  so monitor this.  6)  call if seek emrgent care if getting worse. 7)  return office visit in a month   or as needed.  Prescriptions: PREDNISONE 20 MG TABS (PREDNISONE) Take 3 by mouth each day for 2 days then 2  each day  for 3 days  or as directed .  #20 x 0   Entered and Authorized by:   Madelin Headings MD   Signed by:   Madelin Headings MD on 08/24/2009   Method used:   Print then Give to Patient   RxID:   [id number]

## 2010-02-26 NOTE — Progress Notes (Signed)
  Phone Note Call from Patient   Caller: Patient Call For: Madelin Headings MD Summary of Call: Pt wants to know if she can take Benadryl for asthma, and difficulty breathing..  [phone]   Marthe Patch about Doree Barthel from friends. Initial call taken by: Lynann Beaver CMA,  August 29, 2009 11:33 AM  Follow-up for Phone Call        bewnadryl is ok for allergy but not  specifically for asthma .Marland Kitchen How is her breathing doing and is she taking the  inhalers  as directed ? Follow-up by: Madelin Headings MD,  August 29, 2009 5:03 PM  Additional Follow-up for Phone Call Additional follow up Details #1::        Spoke to pt and she is doing better. Pt is still using her inhalers. Pt will try benadryl and call us back to let us know how she is doing. Additional Follow-up by: Romualdo Bolk, CMA (AAMA),  August 29, 2009 5:05 PM

## 2010-02-26 NOTE — Assessment & Plan Note (Signed)
Summary: 4 MONTH ROV/NJR   Vital Signs:  Patient profile:   65 year old female Menstrual status:  hysterectomy Height:      62.25 inches Weight:      293 pounds BMI:     53.35 Pulse rate:   72 / minute BP sitting:   110 / 60  (right arm) Cuff size:   Thigh  Vitals Entered By: Romualdo Bolk, CMA (AAMA) (February 15, 2009 9:54 AM) CC: Follow-up visit on labs, Hypertension Management   History of Present Illness: Rebecca Rich  comesin today for   follow up of multiple medical problems and labs. Since last visit keloids tender and to see derm and has not been as healthy   over holidays.  Planning to lose weight this year   ..changed  work no traveling.    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    Hypertension History:      She denies headache, chest pain, palpitations, dyspnea with exertion, orthopnea, PND, peripheral edema, visual symptoms, neurologic problems, syncope, and side effects from treatment.  She notes no problems with any antihypertensive medication side effects.        Positive major cardiovascular risk factors include female age 27 years old or older, diabetes, hyperlipidemia, and hypertension.  Negative major cardiovascular risk factors include non-tobacco-user status.      Preventive Screening-Counseling & Management  Alcohol-Tobacco     Alcohol drinks/day: 0     Smoking Status: never  Caffeine-Diet-Exercise     Caffeine use/day: <1     Does Patient Exercise: yes     Type of exercise: walking     Exercise (avg: min/session): 15     Times/week: 3  Current Medications (verified): 1)  Hydrochlorothiazide 25 Mg Tabs (Hydrochlorothiazide) .Marland Kitchen.. 1 By Mouth Once Daily 2)  Ramipril 10 Mg Caps (Ramipril) .Marland Kitchen.. 1 By Mouth Once Daily 3)  Lipitor 10 Mg  Tabs (Atorvastatin Calcium) .Marland Kitchen.. 1 By Mouth Once Daily 4)  Centrum Silver  Tabs (Multiple Vitamins-Minerals) 5)  Vitamin D 1000 Unit Tabs (Cholecalciferol)  Allergies (verified): No Known Drug Allergies  Past  History:  Past medical, surgical, family and social histories (including risk factors) reviewed, and no changes noted (except as noted below).  Past Medical History: Reviewed history from 10/05/2008 and no changes required. Diabetes mellitus, type II Hypertension Osteoarthritis OSA Colonic polyps, hx of Echo lv nl mild ai calcium mv 2007   Keloids   Consults Dr. Luciana Axe  eye check Dr. Allen Derry Pulmonary Dr. Loreta Ave for GI care  Past Surgical History: Reviewed history from 07/28/2008 and no changes required. Hysterectomy  gets regular colonoscopies  Gallbladder June 2010  Family History: Reviewed history from 07/28/2008 and no changes required. Family History Diabetes 1st degree relatives sibs parent  nephew dm and dialysis   heart disease: 3 brothers, 1 sister cancer: father (throat), mother (cervical) 1 sister had a stroke.   Father: Throat cancer Mother: Cervical Cancer, blood clots Siblings: Sister-stroke, all had dm, heart attacks-brothers, Sister-heart attack, another sister had DM Pt has 4 brother and 5 sisters  Social History: Reviewed history from 04/19/2008 and no changes required. 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 Alcohol use-no Drug use-no pt is divorced with children.      Review of Systems  The patient denies anorexia, fever, weight loss, vision loss, decreased hearing, chest pain, syncope, dyspnea on exertion, peripheral edema, prolonged cough, abdominal pain, melena, hematochezia, severe indigestion/heartburn, transient blindness, difficulty walking,  unusual weight change, abnormal bleeding, enlarged lymph nodes, and angioedema.    Physical Exam  General:  Well-developed,well-nourished,in no acute distress; alert,appropriate and cooperative  Neck:  No deformities, masses, or tenderness noted. Lungs:  Normal respiratory effort, chest expands symmetrically. Lungs are clear to auscultation, no crackles or  wheezes. Heart:  Normal rate and regular rhythm. S1 and S2 normal without gallop,, click, rub or other extra sounds. short sem usb non radiating Pulses:  pulses intact without delay   Neurologic:  alert & oriented X3 and gait normal.   Skin:  turgor normal and color normal.  keloids Cervical Nodes:  No lymphadenopathy noted Psych:  Oriented X3, good eye contact, not anxious appearing, and not depressed appearing.   Additional Exam:  see labs    Impression & Recommendations:  Problem # 1:  DIABETES MELLITUS, TYPE II (ICD-250.00) Assessment Unchanged weight gain problematic and risky   wants to delay   medication and intensify lifestyle intervention  Her updated medication list for this problem includes:    Ramipril 10 Mg Caps (Ramipril) .Marland Kitchen... 1 by mouth once daily  Labs Reviewed: Creat: 1.0 (02/08/2009)     Last Eye Exam: normal (07/28/2007) Reviewed HgBA1c results: 6.7 (02/08/2009)  6.3 (09/28/2008)  Problem # 2:  KELOID (ICD-701.4) to see derm  soon   Problem # 3:  MORBID OBESITY (ICD-278.01) Assessment: Deteriorated counseled   contibute to  most of her health issues   Problem # 4:  THYROID FUNCTION TEST, ABNORMAL (ICD-794.5) Assessment: Unchanged follow  acceptable today   Complete Medication List: 1)  Hydrochlorothiazide 25 Mg Tabs (Hydrochlorothiazide) .Marland Kitchen.. 1 by mouth once daily 2)  Ramipril 10 Mg Caps (Ramipril) .Marland Kitchen.. 1 by mouth once daily 3)  Lipitor 10 Mg Tabs (Atorvastatin calcium) .Marland Kitchen.. 1 by mouth once daily 4)  Centrum Silver Tabs (Multiple vitamins-minerals) 5)  Vitamin D 1000 Unit Tabs (Cholecalciferol)  Hypertension Assessment/Plan:      The patient's hypertensive risk group is category C: Target organ damage and/or diabetes.  Her calculated 10 year risk of coronary heart disease is 9 %.  Today's blood pressure is 110/60.  Her blood pressure goal is < 130/80. greater than 50% of visit spent in counseling  25 minutes  Patient Instructions: 1)  Get eye  exam 2)  Please schedule a follow-up appointment in 3 months .  3)  HgBA1c prior to visit  ICD-9:   250.   Contraindications/Deferment of Procedures/Staging:    Test/Procedure: FLU VAX    Reason for deferment: patient declined     Test/Procedure: Pneumovax vaccine    Reason for deferment: patient declined

## 2010-02-26 NOTE — Assessment & Plan Note (Signed)
Summary: 1 MONTH FOLLOW UP/CJR   Vital Signs:  Patient profile:   65 year old female Menstrual status:  hysterectomy Weight:      290 pounds Pulse rate:   80 / minute BP sitting:   120 / 70  (left arm) Cuff size:   large  Vitals Entered By: Romualdo Bolk, CMA Duncan Dull) (September 24, 2009 1:53 PM) CC: follow-up visit, Hypertension Management   History of Present Illness: Rebecca Rich comes in today  for follow up of  breathing   problem . She is much better and feels back to  normal   but   never took prednisone . Took pineapple  and inhaler.  Old remedey rec by family member .  is off all of proventil .     used about 1/2 of  the symbicort. no cough  now . BP is good . No edema or numbness . sleep no change . DM still good .  HT : readings good . OSA : no change feels pretty rested.    Hypertension History:      She denies headache, chest pain, palpitations, dyspnea with exertion, orthopnea, PND, peripheral edema, visual symptoms, neurologic problems, syncope, and side effects from treatment.  She notes no problems with any antihypertensive medication side effects.        Positive major cardiovascular risk factors include female age 35 years old or older, diabetes, hyperlipidemia, and hypertension.  Negative major cardiovascular risk factors include non-tobacco-user status.      Preventive Screening-Counseling & Management  Alcohol-Tobacco     Alcohol drinks/day: 0     Smoking Status: never     Passive Smoke Exposure: no  Caffeine-Diet-Exercise     Caffeine use/day: <1     Does Patient Exercise: yes     Type of exercise: walking     Exercise (avg: min/session): 15     Times/week: 3  Current Medications (verified): 1)  Hydrochlorothiazide 25 Mg Tabs (Hydrochlorothiazide) .Marland Kitchen.. 1 By Mouth Once Daily 2)  Ramipril 10 Mg Caps (Ramipril) .Marland Kitchen.. 1 By Mouth Once Daily 3)  Lipitor 10 Mg  Tabs (Atorvastatin Calcium) .Marland Kitchen.. 1 By Mouth Once Daily 4)  Centrum Silver  Tabs (Multiple  Vitamins-Minerals) 5)  Vitamin D 1000 Unit Tabs (Cholecalciferol)  Allergies (verified): No Known Drug Allergies  Past History:  Care Management: Surgery: Ezzard Standing Gastroenterology: Elnoria Howard Pulmonary: [facility] Podiatry: Tuchmann  Review of Systems  The patient denies anorexia, fever, weight loss, vision loss, hoarseness, chest pain, syncope, dyspnea on exertion, prolonged cough, abdominal pain, severe indigestion/heartburn, transient blindness, difficulty walking, abnormal bleeding, enlarged lymph nodes, and angioedema.    Physical Exam  General:  Well-developed,well-nourished,in no acute distress; alert,appropriate and cooperative throughout examination Head:  normocephalic and atraumatic.   Eyes:  vision grossly intact and pupils equal.   Neck:  No deformities, masses, or tenderness noted. Lungs:  Normal respiratory effort, chest expands symmetrically. Lungs are clear to auscultation, no crackles or wheezes.no dullness.   Heart:  normal rate, regular rhythm, no gallop, and no lifts.   harshfaint sem sitting  usb no radiation  Pulses:  pulses intact without delay   Extremities:  no clubbing cyanosis or edema  Neurologic:   non focal  Skin:  turgor normal, color normal, no ecchymoses, and no petechiae.   Cervical Nodes:  No lymphadenopathy noted Psych:  Oriented X3, good eye contact, not anxious appearing, and not depressed appearing.     Impression & Recommendations:  Problem # 1:  ACUTE BRONCHOSPASM (  ICD-519.11) Assessment Improved on b agonist a one.. NOw resolved  chest x ray said  bronchitis with CM     Cause  ? if from viral infection never took pred or other meds.   feels good now . 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    does have murmur  usb  no recent echos   or cv eval    will get echo . No CHF signs  Orders: Cardiology Referral (Cardiology) Pulmonary Referral (Pulmonary)  Problem # 3:   HYPERTENSION (ICD-401.9) controlled  Her updated medication list for this problem includes:    Hydrochlorothiazide 25 Mg Tabs (Hydrochlorothiazide) .Marland Kitchen... 1 by mouth once daily    Ramipril 10 Mg Caps (Ramipril) .Marland Kitchen... 1 by mouth once daily  Orders: Pulmonary Referral (Pulmonary)  BP today: 120/70 Prior BP: 120/70 (08/24/2009)  Prior 10 Yr Risk Heart Disease: 15 % (05/10/2009)  Labs Reviewed: K+: 3.9 (02/08/2009) Creat: : 1.0 (02/08/2009)   Chol: 108 (09/28/2008)   HDL: 45.80 (09/28/2008)   LDL: 42 (09/28/2008)   TG: 102.0 (09/28/2008)  Problem # 4:  DIABETES MELLITUS, TYPE II (ICD-250.00) Assessment: Unchanged controlled  Her updated medication list for this problem includes:    Ramipril 10 Mg Caps (Ramipril) .Marland Kitchen... 1 by mouth once daily  Problem # 5:  OBESITY (ICD-278.00) morbid  needs to continue weight loss Ht: 62.25 (05/01/2009)   Wt: 290 (09/24/2009)   BMI: 54.26 (05/01/2009)  Complete Medication List: 1)  Hydrochlorothiazide 25 Mg Tabs (Hydrochlorothiazide) .Marland Kitchen.. 1 by mouth once daily 2)  Ramipril 10 Mg Caps (Ramipril) .Marland Kitchen.. 1 by mouth once daily 3)  Lipitor 10 Mg Tabs (Atorvastatin calcium) .Marland Kitchen.. 1 by mouth once daily 4)  Centrum Silver Tabs (Multiple vitamins-minerals) 5)  Vitamin D 1000 Unit Tabs (Cholecalciferol)  Hypertension Assessment/Plan:      The patient's hypertensive risk group is category C: Target organ damage and/or diabetes.  Her calculated 10 year risk of coronary heart disease is 15 %.  Today's blood pressure is 120/70.  Her blood pressure goal is < 130/80.  Contraindications/Deferment of Procedures/Staging:    Test/Procedure: FLU VAX    Reason for deferment: patient declined   Patient Instructions: 1)  ROV in december 2)  In the mean time we will get and echocardiogram and pulmonary function tests to look at heart and lung function and follow up depending on results .  3)  Hg a1c  pre visit  250, 401.9 272.4  4)  BMP prior to visit, ICD-9:   5)   Hepatic Panel prior to visit ICD-9:  6)  Lipid panel prior to visit ICD-9 :  7)  TSH prior to visit ICD-9 :  8)  Rov in december

## 2010-02-26 NOTE — Progress Notes (Signed)
Summary: refill ramipril  Phone Note Call from Patient Call back at Home Phone [phone]   Caller: Patient Call For: Madelin Headings MD Summary of Call: pt needs new rx ramipril  [facility] [address] [phone].  Initial call taken by: Heron Sabins,  August 02, 2009 3:39 PM    Prescriptions: RAMIPRIL 10 MG CAPS (RAMIPRIL) 1 by mouth once daily  #90 x 0   Entered by:   Duard Brady LPN   Authorized by:   Madelin Headings MD   Signed by:   Duard Brady LPN on 03/47/4259   Method used:   Electronically to        [facility] [phone]* (retail)       [address]       [place], [place]  [address]       Ph: [phone] or [phone]       Fax: [phone]   RxID:   [id number]

## 2010-02-26 NOTE — Progress Notes (Signed)
Summary: refills.  Phone Note From Pharmacy   Caller: [facility] Reason for Call: Needs renewal Details for Reason: HCTZ, altace and Lipitor Initial call taken by: Romualdo Bolk, CMA Duncan Dull),  February 07, 2009 9:32 AM  Follow-up for Phone Call        Faxed back to pharmacy. Follow-up by: Romualdo Bolk, CMA (AAMA),  February 07, 2009 9:33 AM    Prescriptions: LIPITOR 10 MG  TABS (ATORVASTATIN CALCIUM) 1 by mouth once daily  #90 x 0   Entered by:   Romualdo Bolk, CMA (AAMA)   Authorized by:   Madelin Headings MD   Signed by:   Romualdo Bolk, CMA (AAMA) on 02/07/2009   Method used:   Handwritten   RxID:   [id number] RAMIPRIL 10 MG CAPS (RAMIPRIL) 1 by mouth once daily  #90 x 0   Entered by:   Romualdo Bolk, CMA (AAMA)   Authorized by:   Madelin Headings MD   Signed by:   Romualdo Bolk, CMA (AAMA) on 02/07/2009   Method used:   Handwritten   RxID:   [id number] HYDROCHLOROTHIAZIDE 25 MG TABS (HYDROCHLOROTHIAZIDE) 1 by mouth once daily  #90 x 0   Entered by:   Romualdo Bolk, CMA (AAMA)   Authorized by:   Madelin Headings MD   Signed by:   Romualdo Bolk, CMA (AAMA) on 02/07/2009   Method used:   Handwritten   RxID:   [id number]

## 2010-02-26 NOTE — Progress Notes (Signed)
Summary: refills  Phone Note From Pharmacy   Caller: [facility]  Reason for Call: Needs renewal Details for Reason: lipitor 10mg  and HCTZ 25mg  Initial call taken by: Romualdo Bolk, CMA Duncan Dull),  November 30, 2009 9:37 AM  Follow-up for Phone Call        Rx sent to pharmacy. Follow-up by: Romualdo Bolk, CMA (AAMA),  November 30, 2009 9:38 AM    Prescriptions: LIPITOR 10 MG  TABS (ATORVASTATIN CALCIUM) 1 by mouth once daily  #90 x 0   Entered by:   Romualdo Bolk, CMA (AAMA)   Authorized by:   Madelin Headings MD   Signed by:   Romualdo Bolk, CMA (AAMA) on 11/30/2009   Method used:   Electronically to        [facility]* (mail-order)       [address], [address] [place], [place]  [address]       Ph: [phone]       Fax: [phone]   RxID:   [id number] HYDROCHLOROTHIAZIDE 25 MG TABS (HYDROCHLOROTHIAZIDE) 1 by mouth once daily  #90 x 0   Entered by:   Romualdo Bolk, CMA (AAMA)   Authorized by:   Madelin Headings MD   Signed by:   Romualdo Bolk, CMA (AAMA) on 11/30/2009   Method used:   Electronically to        [facility]* (mail-order)       [address], [address] [place]  [address]       Ph: [phone]       Fax: [phone]   RxID:   [id number]

## 2010-02-26 NOTE — Progress Notes (Signed)
Summary: refill   Phone Note From Pharmacy   Caller: [facility] Details for Reason: Ramipril 10mg  Initial call taken by: Romualdo Bolk, CMA Duncan Dull),  April 18, 2009 4:57 PM  Follow-up for Phone Call        Rx sent to pharmacy. Follow-up by: Romualdo Bolk, CMA Duncan Dull),  April 18, 2009 4:57 PM    Prescriptions: RAMIPRIL 10 MG CAPS (RAMIPRIL) 1 by mouth once daily  #90 x 0   Entered by:   Romualdo Bolk, CMA (AAMA)   Authorized by:   Madelin Headings MD   Signed by:   Romualdo Bolk, CMA (AAMA) on 04/18/2009   Method used:   Electronically to        [facility]* (mail-order)       [address], [address] [place], [place]  [address]       Ph: [phone]       Fax: [phone]   RxID:   [id number]   Appended Document: refill     Clinical Lists Changes  Medications: Rx of HYDROCHLOROTHIAZIDE 25 MG TABS (HYDROCHLOROTHIAZIDE) 1 by mouth once daily;  #90 x 0;  Signed;  Entered by: Romualdo Bolk, CMA (AAMA);  Authorized by: Madelin Headings MD;  Method used: Electronically to [facility]*, [address], [place], [place]  [address], Ph: [phone], Fax: [phone] Rx of LIPITOR 10 MG  TABS (ATORVASTATIN CALCIUM) 1 by mouth once daily;  #90 x 0;  Signed;  Entered by: Romualdo Bolk, CMA (AAMA);  Authorized by: Madelin Headings MD;  Method used: Electronically to [facility]*, [address], [place], [place]  [address], Ph: [phone], Fax: [phone]    Prescriptions: LIPITOR 10 MG  TABS (ATORVASTATIN CALCIUM) 1 by mouth once daily  #90 x 0   Entered by:   Romualdo Bolk, CMA (AAMA)   Authorized by:   Madelin Headings MD   Signed by:   Romualdo Bolk, CMA (AAMA) on 04/18/2009   Method used:   Electronically to        [facility]* (mail-order)       [address], [address] [place], [place]   [address]       Ph: [phone]       Fax: [phone]   RxID:   [id number] HYDROCHLOROTHIAZIDE 25 MG TABS (HYDROCHLOROTHIAZIDE) 1 by mouth once daily  #90 x 0   Entered by:   Romualdo Bolk, CMA (AAMA)   Authorized by:   Madelin Headings MD   Signed by:   Romualdo Bolk, CMA (AAMA) on 04/18/2009   Method used:   Electronically to        [facility]* (mail-order)       [address] [place]  [address]       Ph: [phone]       Fax: [phone]   RxID:   [id number]

## 2010-02-26 NOTE — Miscellaneous (Signed)
Summary: Orders Update pft charges  Clinical Lists Changes  Orders: Added new Service order of Carbon Monoxide diffusing w/capacity (94720) - Signed Added new Service order of Lung Volumes (94240) - Signed Added new Service order of Spirometry (Pre & Post) (94060) - Signed 

## 2010-02-26 NOTE — Letter (Signed)
Summary: Request for Medical Clearance/C. Mikey Bussing, DDS  Request for Medical Clearance/C. Mikey Bussing, DDS   Imported By: Maryln Gottron 06/12/2009 13:04:31  _____________________________________________________________________  External Attachment:    Type:   Image     Comment:   External Document

## 2010-02-26 NOTE — Assessment & Plan Note (Signed)
Summary: 3 month rov/njr   Vital Signs:  Patient profile:   65 year old female Menstrual status:  hysterectomy Weight:      291 pounds Pulse rate:   80 / minute BP sitting:   124 / 80  (left arm) Cuff size:   large  Vitals Entered By: Romualdo Bolk, CMA (AAMA) (May 10, 2009 9:43 AM) CC: Follow-up visit on labs, Hypertension Management   History of Present Illness: Rebecca Rich comes in today for  follow up of DM  and other med conditions. BG  Doing better.  Has lost weight and plans decreasing to continue swimming etc.   Had eye check in [place] ?if gum disease    and  needs to go to periodontist.  Keloids Scar rx not that effective but some better. No vision change numbness or feet ulcers .  No cp sob . BP doing well no se of meds  LIPIDS  nose of meds .   Hypertension History:      She denies headache, chest pain, palpitations, dyspnea with exertion, orthopnea, PND, peripheral edema, visual symptoms, neurologic problems, syncope, and side effects from treatment.  She notes no problems with any antihypertensive medication side effects.        Positive major cardiovascular risk factors include female age 20 years old or older, diabetes, hyperlipidemia, and hypertension.  Negative major cardiovascular risk factors include non-tobacco-user status.      Preventive Screening-Counseling & Management  Alcohol-Tobacco     Alcohol drinks/day: 0     Smoking Status: never  Caffeine-Diet-Exercise     Caffeine use/day: <1     Does Patient Exercise: yes     Type of exercise: walking     Exercise (avg: min/session): 15     Times/week: 3  Current Medications (verified): 1)  Hydrochlorothiazide 25 Mg Tabs (Hydrochlorothiazide) .Marland Kitchen.. 1 By Mouth Once Daily 2)  Ramipril 10 Mg Caps (Ramipril) .Marland Kitchen.. 1 By Mouth Once Daily 3)  Lipitor 10 Mg  Tabs (Atorvastatin Calcium) .Marland Kitchen.. 1 By Mouth Once Daily 4)  Centrum Silver  Tabs (Multiple Vitamins-Minerals) 5)  Vitamin D 1000 Unit Tabs  (Cholecalciferol)  Allergies (verified): No Known Drug Allergies  Past History:  Past medical, surgical, family and social histories (including risk factors) reviewed, and no changes noted (except as noted below).  Past Medical History: Reviewed history from 10/05/2008 and no changes required. Diabetes mellitus, type II Hypertension Osteoarthritis OSA Colonic polyps, hx of Echo lv nl mild ai calcium mv 2007   Keloids   Consults Dr. Luciana Axe  eye check Dr. Allen D[facility] Dr. [address] for GI care  Past Surgical History: Reviewed history from 07/28/2008 and no changes required. Hysterectomy  gets regular colonoscopies  Gallbladder June 2010  Past History:  Care Management: Surgery: Ezzard Standing Gastroenterology: Elnoria Howard Pulmonary: [facility]ance Podiatry: Cicero Duck  Family History: Reviewed history from 07/28/2008 and no changes required. Family History Diabetes 1st degree relatives sibs parent  nephew dm and dialysis   heart disease: 3 brothers, 1 sister cancer: father (throat), mother (cervical) 1 sister had a stroke.   Father: Throat cancer Mother: Cervical Cancer, blood clots Siblings: Sister-stroke, all had dm, heart attacks-brothers, Sister-heart attack, another sister had DM Pt has 4 brother and 5 sisters  Social History: Reviewed history from 04/19/2008 and no changes required. 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 Alcohol use-no Drug use-no pt is divorced with children.      Review of Systems  The patient  denies anorexia, fever, hoarseness, chest pain, syncope, dyspnea on exertion, peripheral edema, prolonged cough, transient blindness, difficulty walking, depression, abnormal bleeding, enlarged lymph nodes, and angioedema.    Physical Exam  General:  Well-developed,well-nourished,in no acute distress; alert,appropriate and cooperative throughout examination Head:  normocephalic and atraumatic.   Eyes:  vision grossly  intact and pupils equal.   Lungs:  Normal respiratory effort, chest expands symmetrically. Lungs are clear to auscultation, no crackles or wheezes. Heart:  Normal rate and regular rhythm. S1 and S2 normal without gallop,, click, rub or other extra sounds. short sem usb non radiating Pulses:  pulses intact without delay   Extremities:  no clubbing cyanosis or edema  Skin:  turgor normal, color normal, and no ecchymoses.   keloids  noted  Cervical Nodes:  No lymphadenopathy noted Psych:  Oriented X3, normally interactive, good eye contact, not anxious appearing, and not depressed appearing.  nl cognition  Diabetes Management Exam:    Foot Exam (with socks and/or shoes not present):       Sensory-Monofilament:          Right foot: normal       Sensory-other: lateral nails thichened rest look normal        Inspection:          Right foot: normal       Nails:          Right foot: thickened    Eye Exam:       Eye Exam done elsewhere          Date: 01/27/2009          Results: normal          Done by: Dr. Luciana Axe   Impression & Recommendations:  Problem # 1:  DIABETES MELLITUS, TYPE II (ICD-250.00) Assessment Improved  Her updated medication list for this problem includes:    Ramipril 10 Mg Caps (Ramipril) .Marland Kitchen... 1 by mouth once daily  Problem # 2:  MORBID OBESITY (ICD-278.01) Assessment: Improved continue weight loss   Problem # 3:  HYPERLIPIDEMIA (ICD-272.2)  Her updated medication list for this problem includes:    Lipitor 10 Mg Tabs (Atorvastatin calcium) .Marland Kitchen... 1 by mouth once daily  Labs Reviewed: SGOT: 20 (09/28/2008)   SGPT: 16 (09/28/2008)  10 Yr Risk Heart Disease: 15 % Prior 10 Yr Risk Heart Disease: 9 % (02/15/2009)   HDL:45.80 (09/28/2008), 51.0 (12/01/2006)  LDL:42 (09/28/2008), 69 (12/01/2006)  Chol:108 (09/28/2008), 143 (12/01/2006)  Trig:102.0 (09/28/2008), 117 (12/01/2006)  Problem # 4:  KELOID (ICD-701.4) Assessment: Unchanged  Problem # 5:  HYPERTENSION  (ICD-401.9) Assessment: Improved  Her updated medication list for this problem includes:    Hydrochlorothiazide 25 Mg Tabs (Hydrochlorothiazide) .Marland Kitchen... 1 by mouth once daily    Ramipril 10 Mg Caps (Ramipril) .Marland Kitchen... 1 by mouth once daily  Complete Medication List: 1)  Hydrochlorothiazide 25 Mg Tabs (Hydrochlorothiazide) .Marland Kitchen.. 1 by mouth once daily 2)  Ramipril 10 Mg Caps (Ramipril) .Marland Kitchen.. 1 by mouth once daily 3)  Lipitor 10 Mg Tabs (Atorvastatin calcium) .Marland Kitchen.. 1 by mouth once daily 4)  Centrum Silver Tabs (Multiple vitamins-minerals) 5)  Vitamin D 1000 Unit Tabs (Cholecalciferol)  Hypertension Assessment/Plan:      The patient's hypertensive risk group is category C: Target organ damage and/or diabetes.  Her calculated 10 year risk of coronary heart disease is 15 %.  Today's blood pressure is 124/80.  Her blood pressure goal is < 130/80.  Patient Instructions: 1)  rov June or July  2)  HgBA1c prior to visit  ICD-9:  250.0  with  3)  continue weight loss.  4)  Urine Microalbumin prior to visit ICD-9 :   Prevention & Chronic Care Immunizations   Influenza vaccine: Not documented   Influenza vaccine deferral: patient declined  (02/15/2009)    Tetanus booster: 01/28/2003: Historical    Pneumococcal vaccine: Not documented   Pneumococcal vaccine deferral: patient declined  (02/15/2009)    H. zoster vaccine: Not documented  Colorectal Screening   Hemoccult: Not documented    Colonoscopy: sm polyps  (05/08/2004)  Other Screening   Pap smear: Not documented    Mammogram: Normal Bilateral  (01/28/2004)    DXA bone density scan: Not documented   Smoking status: never  (05/10/2009)  Diabetes Mellitus   HgbA1C: 6.4  (05/03/2009)    Eye exam: normal  (01/27/2009)   Eye exam due: 01/2010    Foot exam: yes  (05/10/2009)   High risk foot: Not documented   Foot care education: Not documented    Urine microalbumin/creatinine ratio: 3.1  (07/28/2008)   Urine microalbumin/cr due:  07/28/2009    Diabetes flowsheet reviewed?: Yes   Progress toward A1C goal: At goal  Lipids   Total Cholesterol: 108  (09/28/2008)   LDL: 42  (09/28/2008)   LDL Direct: Not documented   HDL: 45.80  (09/28/2008)   Triglycerides: 102.0  (09/28/2008)    SGOT (AST): 20  (09/28/2008)   SGPT (ALT): 16  (09/28/2008)   Alkaline phosphatase: 66  (09/28/2008)   Total bilirubin: 0.9  (09/28/2008)    Lipid flowsheet reviewed?: Yes   Progress toward LDL goal: At goal  Hypertension   Last Blood Pressure: 124 / 80  (05/10/2009)   Serum creatinine: 1.0  (02/08/2009)   Serum potassium 3.9  (02/08/2009)  Self-Management Support :    Diabetes self-management support: Not documented    Hypertension self-management support: Not documented    Lipid self-management support: Not documented

## 2010-02-27 LAB — ANA: Anti Nuclear Antibody(ANA): NEGATIVE

## 2010-02-27 LAB — HEPATITIS B SURFACE ANTIGEN: Hepatitis B Surface Ag: NEGATIVE

## 2010-02-27 LAB — HEPATITIS B SURFACE ANTIBODY,QUALITATIVE: Hep B S Ab: NEGATIVE

## 2010-02-28 ENCOUNTER — Telehealth: Payer: Self-pay | Admitting: Internal Medicine

## 2010-02-28 NOTE — Progress Notes (Signed)
  Phone Note Other Incoming   Summary of Call: call from Josh PA in [facility]  ms frances had dark urine better and dizziness  dx as vertigo and given meclizine   lfts slightly worse and is off lipitor.   Bili is 4 range  elevated transaminases and alk phos and [place] of liver show dil cuct but  no gb poss old.    Will ad direct bili and ggt and keep appt  next tuesday.   or as needed . can cx lab ppt. May do gi referral in the meantime.  Initial call taken by: Madelin Headings MD,  February 19, 2010 1:23 PM

## 2010-02-28 NOTE — Progress Notes (Signed)
Summary: ED Vist from 02/19/10   Rebecca Rich, Rebecca Rich MRN: [id number] Acct#: [id number] PHYSICIAN DOCUMENTATION SHEET Tue Jan 24 15:29:37 EST 2012 Eligha Bridegroom. [facility] [address] [place], [place] [address] PHONE: [phone] MRN: [id number] Account #: [id number] Name: Rebecca Rich, Rebecca Rich Sex: F Age: 65 DOB: [birth year] Complaint: Dizziness Primary Diagnosis: Vertigo Arrival Time: 02/19/2010 07:32 Discharge Time: 02/19/2010 13:29 All Providers: Mr. Rhea Bleacher - PA; Rebecca Rich - DO PROVIDER: Mr. Rhea Bleacher - PA HPI: The patient is a 65 year old female who presents with a chief complaint of dizziness. The history was provided by the patient. Patient with dizziness described as spinning and nausea since yesterday afternoon. States she has had vertigo in the past but this is worse. Also states fever to 103F yesterday, now resolved, and dark urine. She has not taken any medication other than her BP medication. No head injury. No could symptoms or ear pain. No neck pain, chest pain, SOB, cough, abdominal pain, change in BMs, dark stools. No syncope. No history of irregular heart beat, CAD, or stroke. The dizziness started yesterday. The onset was acute. The Pattern is persistent. The Course is persistent. The condition is aggravated by movement of the head. The condition is relieved by lying still. The patient has a significant history of similar symptoms previously. 08:08 02/19/2010 by Rhea Bleacher - PA, Mr. ROS: Constitutional: Positive for fever. Negative for chills and fatigue. Eyes: Negative for eye pain. ENMT: Negative for ear pain, nasal congestion, rhinorrhea, sinus pressure, sneezing and sore throat. Cardiovascular: Negative for chest pain, palpitations and diaphoresis. Respiratory: Negative for cough and dyspnea. Gastrointestinal: Positive for nausea. Negative for vomiting, diarrhea, abdominal pain, constipation, blood in stool and melena. NOTE - Has had 'dry  heaves' Genitourinary: Negative for dysuria, Urgency, flank pain, Frequency and hematuria. Musculoskeletal: Negative for myalgias, arthralgias and neck pain. Skin: Negative for rash. Neuro: Positive for Weakness. Negative for headache. NOTE - Generalized weakness Hematologic: Negative for swollen lymph nodes. 08:10 02/19/2010 by Rhea Bleacher - PA, Mr. PMH: Documentation: physician assistant reviewed/amended Historian: patient 1 Rebecca Rich, Rebecca Rich - MRN: [id number] Acct#: [id number] Patient's Current Physicians Patient's Current Physicians (please list PCP first) Tarance Balan - IM, Rebecca Rich Past medical history: hypertension, diabetes, arthritis Surgical History: hysterectomy Social History: non-smoker, non-drinker, no drug abuse Allergies Drug Reaction Allergy Note NKDA 07:46 02/19/2010 by Rhea Bleacher - PA, Mr. Home Medications: Documentation: physician assistant reviewed/amended Medications Medication [Medication] Dosage Frequency Last Dose ramipril Oral 10mg  once a day hydrochlorothiazide Oral once a day Vitamin D Oral 08:08 02/19/2010 by Rhea Bleacher - PA, Mr. Physical examination: Vital signs and O2 SAT: reviewed, normal except, hypertensive Constitutional: well developed, well nourished, Uncomfortable appearing NOTE - Moves very slowly Head and Face: normocephalic, atraumatic Eyes: normal appearance, EOMI, PERRL ENMT: mouth and pharynx normal, left TM normal, right TM normal, nasal mucosae normal, mucous membranes moist, no rhinorrhea Neck: supple, full range of motion, no lymphadenopathy, no meningeal signs Spine: entire spine non-tender Cardiovascular: regular rate and rhythm, no murmur, rub, or gallop Respiratory: normal, breath sounds clear & equal bilaterally, no rales, rhonchi, wheezes, or rub Chest: nontender, no deformity Abdomen: soft, obese, nontender, nondistended, no guarding, no rebound tenderness Genitourinary: no CVA tenderness Extremities: normal,  neurovascularly intact, no tenderness, no edema Neuro: AA&Ox3, motor intact in all extremities, Cranial Nerves II-XII intact, sensation normal , gait normal, normal coordination, normal speech, no facial droop, no pronator drift NOTE - Ambulates with assistance. Patient cannot tolerate horizontal nystagmus testing. When  she looks laterally she becomes more dizzy and closes eyes. Skin: color normal, warm, dry Psychiatric: no abnormalities of mood or affect 08:12 02/19/2010 by Rhea Bleacher - PA, Mr. 2 Rebecca Rich, Rebecca Rich - MRN: [id number] Acct#: [id number] Reviewed result: Result Type: Cleda Daub: [id number] Step Type: LAB Procedure Name: GLUCOSE, CAPILLARY Procedure: GLUCOSE, CAPILLARY Result: GLUCOSE 180 mg/dL [47-82] H 95:62 13/08/6576 by Rhea Bleacher - PA, Mr. Reviewed result: Result Type: Cleda Daub: [id number] Step Type: LAB Procedure Name: CBC WITH DIFF Procedure: CBC WITH DIFF Result: WBC COUNT 8.Rebecca K/uL [4.0-10.Rebecca] RBC COUNT 4.21 MIL/uL [3.87-Rebecca.11] HEMOGLOBIN 12.2 g/dL [84.1-32.4] HEMATOCRIT 37.1 % [36.0-46.0] MCV 88.1 fL [78.0-100.0] MCH 29.0 pg [26.0-34.0] MCHC 32.9 g/dL [40.1-02.7] RDW 25.3 % [11.Rebecca-15.Rebecca] PLATELET COUNT 159 K/uL [150-400] NEUTROPHIL 83 % [43-77] H ABS GRANULOCYTE 7.1 K/uL [1.7-7.7] LYMPHOCYTE 11 % [12-46] L ABS LYMPH 0.9 K/uL [0.7-4.0] MONOCYTE Rebecca % [3-12] ABS MONOCYTE 0.4 K/uL [0.1-1.0] EOSINOPHIL 1 % [0-Rebecca] ABS EOS 0.0 K/uL [0.0-0.7] BASOPHIL 0 % [0-1] ABS BASO 0.0 K/uL [0.0-0.1] 08:38 02/19/2010 by Rhea Bleacher - PA, Mr. Reviewed result: Result Type: Cleda Daub: [id number] Step Type: LAB Procedure Name: Lourdes Sledge, POC Procedure: CARDIAC MARKERS, POC Procedure Notes: COMMENT - OF 0.00-0.09 ng/mL SHOW NO INDICATION OF MYOCARDIAL 3 Rebecca Rich, Rebecca Rich - MRN: [id number] Acct#: [id number] INJURY. PERSISTENTLY INCREASED TROPONIN VALUES IN THE RANGE OF 0.10-0.24 ng/mL CAN BE SEEN IN: -UNSTABLE ANGINA -CONGESTIVE HEART FAILURE  -MYOCARDITIS -CHEST TRAUMA -ARRYHTHMIAS -LATE PRESENTING MI -COPD CLINICAL FOLLOW-UP RECOMMENDED. TROPONIN VALUES >=0.25 ng/mL INDICATE POSSIBLE MYOCARDIAL ISCHEMIA. SERIAL TESTING RECOMMENDED. Result: MYOGLOBIN, POC 101 ng/mL [12-200] CKMB, POC <1.0 ng/mL [1.0-8.0] L TROPONIN I, POC <0.05 ng/mL [0.00-0.09] COMMENT TROPONIN VALUES IN THE RANGE 08:46 02/19/2010 by Rhea Bleacher - PA, Mr. Reviewed result: Result Type: Cleda Daub: [id number] Step Type: LAB Procedure Name: COMPREHENSIVE METABOLIC PANEL Procedure: COMPREHENSIVE METABOLIC PANEL Procedure Notes: GFR, Est Afr Am - The eGFR has been calculated using the MDRD equation. This calculation has not been validated in all clinical situations. eGFR's persistently <60 mL/min signify possible Chronic Kidney Disease. Result: SODIUM 137 mEq/L [135-145] POTASSIUM 3.4 mEq/L [3.Rebecca-Rebecca.1] L CHLORIDE 105 mEq/L [96-112] CARBON DIOXIDE 21 mEq/L [19-32] GLUCOSE 178 mg/dL [18-84] H BUN 17 mg/dL [1-66] CREATININE 0.63 mg/dL [0.1-6.0] CALCIUM 9.0 mg/dL [1.0-93.2] TOTAL PROTEIN 7.3 g/dL [3.Rebecca-Rebecca.7] ALBUMIN 3.1 g/dL [3.2-2.0] L AST/SGOT 67 U/L [0-37] H ALT/SGPT 138 U/L [0-35] H ALKALINE PHOSPHATASE 355 U/L [39-117] H BILIRUBIN, TOTAL 4.3 mg/dL [2.Rebecca-4.2] H GFR, Est Non Af Am 55 mL/min [>60] L GFR, Est Afr Am >60 mL/min [>60] Note/Interpretation: Pt was d/c on lipitor earlier this month because her liver enzymes were elevated. Liver enzymes are only slightly higher than in early Jan, however Tbili is increased. 09:00 02/19/2010 by Rhea Bleacher - PA, Mr. Reviewed result: Result Type: Cleda Daub: [id number] Step Type: XRAY 4 Rebecca Rich, Rebecca Rich MRN: [id number] Acct#: [id number] Procedure Name: CT HEAD W/O CM Procedure: CT HEAD W/O CM Result: Clinical Data: Frontal headache. Dizziness. Chills. Nausea. CT HEAD WITHOUT CONTRAST Technique: Contiguous axial images were obtained from the base of the skull through the vertex without  contrast. Comparison: Report from 12/17/2000 Findings: The brain stem, cerebellum, cerebral peduncles, thalami, basal ganglia, basilar cisterns, and ventricular system appear unremarkable. No intracranial hemorrhage, mass lesion, or acute infarction is identified. There is air fluid level in the left sphenoid sinus compatible with acute sinusitis. IMPRESSION: 1. Acute left sphenoid sinusitis. 2. Otherwise, no significant abnormality identified. Note/Interpretation: note sinusitis 09:04 02/19/2010 by Rhea Bleacher - PA,  Mr. Reviewed result: Result Type: Cleda Daub: [id number] Step Type: LAB Procedure Name: URINE MACROSCOPIC Procedure: URINE MACROSCOPIC Procedure Notes: URINE COLOR - BIOCHEMICALS MAY BE AFFECTED BY COLOR Result: URINE COLOR ORANGE [YELLOW] A URINE APPEARANCE CLOUDY [CLEAR] A URINE SPEC GRAVITY 1.028 [1.005-1.030] URINE PH Rebecca.0 [Rebecca.0-8.0] URINE GLUCOSE NEGATIVE mg/dL [NEG] URINE HEMOGLOBIN NEGATIVE [NEG] URINE BILIRUBIN MODERATE [NEG] A URINE KETONES 15 mg/dL [NEG] A URINE TOTAL PROTEIN 30 mg/dL [NEG] A Rebecca Rich, Rebecca Rich - MRN: [id number] Acct#: [id number] URINE UROBILINOGEN 1.0 mg/dL [2.2-0.2] URINE NITRITE POSITIVE [NEG] A LEUKOCYTE ESTERASE SMALL [NEG] A Note/Interpretation: c/w hyperbilirubinemia 09:58 02/19/2010 by Rhea Bleacher - PA, Mr. Reviewed result: Result Type: Cleda Daub: [id number] Step Type: LAB Procedure Name: URINE MICROSCOPIC Procedure: URINE MICROSCOPIC Result: URINE EPITHELIAL FEW [RARE] A URINE WBC'S 0-2 WBC/hpf [<3] BACTERIA FEW [RARE] A CAST HYALINE CASTS [NEG] A URINE OTHER MUCOUS PRESENT 09:58 02/19/2010 by Rhea Bleacher - PA, Mr. Reviewed result: Result Type: Cleda Daub: [id number] Step Type: XRAY Procedure Name: US ABDOMEN COMPLETE Procedure: US ABDOMEN COMPLETE Result: Clinical Data: Nausea, diabetes ABDOMINAL ULTRASOUND COMPLETE Comparison: Jun 16, 2008 Findings: Gallbladder: Surgically  absent. Common Bile Duct: Dilated, measuring 18 mm. No gross common duct stones are seen. Liver: No focal mass lesion identified. Within normal limits in parenchymal echogenicity. There is intrahepatic biliary ductal dilatation. 6 Rebecca Rich, Rebecca Rich MRN: [id number] Acct#: [id number] IVC: Appears normal. Pancreas: No abnormality identified. Spleen: Within normal limits in size and echotexture. Right kidney: Normal in size and parenchymal echogenicity. No evidence of mass or hydronephrosis. Left kidney: Normal in size and parenchymal echogenicity. No evidence of mass or hydronephrosis. Abdominal Aorta: No aneurysm identified. IMPRESSION: There is dilatation of the common bile duct, as well as intrahepatic biliary ductal dilatation. This could be related to prior sphincterotomy. No gross ductal stones are identified. 11:10 02/19/2010 by Rhea Bleacher - PA, Mr. Reviewed result: Result Type: Cleda Daub: [id number] Step Type: LAB Procedure Name: GGT Procedure: GGT Result: GGT 451 U/L [7-51] H 12:54 02/19/2010 by Rhea Bleacher - PA, Mr. Reviewed result: Result Type: Cleda Daub: [id number] Step Type: LAB Procedure Name: HEPATIC FUNCTION PANEL Procedure: HEPATIC FUNCTION PANEL Result: TOTAL PROTEIN 7.1 g/dL [0.9-3.8] ALBUMIN 3.2 g/dL [1.8-2.9] L AST/SGOT 68 U/L [0-37] H ALT/SGPT 138 U/L [0-35] H ALKALINE PHOSPHATASE 358 U/L [39-117] H BILIRUBIN, TOTAL 4.1 mg/dL [9.3-7.1] H BILIRUBIN, DIRECT 2.1 mg/dL [6.9-6.7] H BILIRUBIN, INDIRECT 2.0 mg/dL [8.9-3.8] H 7 Rebecca Rich, Rebecca Rich - MRN: [id number] Acct#: [id number] 12:54 02/19/2010 by Rhea Bleacher - PA, Mr. ED Course: Comments: Pt seen and examined. D/w Dr. Clarene Duke. Labs, imaging ordered. Pt was ambulated and took several steps while holding the hands of myself and the nurse. She was supporting herself. 08:06 02/19/2010 by Rhea Bleacher - PA, Mr. ED Course: Comments: Pt states vertigo is improved, continues to be nauseous.  Informed of results. 09:13 02/19/2010 by Rhea Bleacher - PA, Mr. ED Course: Consultant Consultant Plan Comments Timestamp Rebecca Rich - IM, Rebecca Rich patient's care discussed with physician 12:40 02/19/2010 by Rhea Bleacher - PA, Mr. ED Course: Comments: Spoke with Dr. Fabian Sharp to obtain follow-up. She requested GGT and hepatic fcn panel. She will arrange for GI follow-up and follow patient closely. Patient informed of discussion with her physician. She ambulated with no assistance. She will f/u with Dr. Fabian Sharp next week during her previously scheduled appointment. Pt told only to take BP meds. She verbalizes undertstanding and agrees with plan. 13:33 02/19/2010 by Rhea Bleacher - PA, Mr. Patient disposition: Patient disposition:  Disch - Home Primary Diagnosis: vertigo Additional diagnoses: elevated liver enzymes, hyperbilirubinemia Counseling: advised of diagnosis, advised of treatment plan, advised of xray and lab findings, advised of need for close follow-up, advised of need to return for worsening or changing symptoms, advised of specific symptoms that should prompt their return, patient voices understanding, family informed of diagnosis, family informed of treatment plan, family voices understanding 12:36 02/19/2010 by Rhea Bleacher - PA, Mr. Medication disposition: Medications Medication [Medication] Dosage Frequency Last Dose Medication disposition PCP contact ramipril Oral 10mg  once a day continue hydrochlorothiazide Oral once a day continue 8 Rebecca Rich, Rebecca Rich - MRN: [id number] Acct#: [id number] Medications Medication [Medication] Dosage Frequency Last Dose Medication disposition PCP contact Vitamin D Oral continue 12:36 02/19/2010 by Rhea Bleacher - PA, Mr. Prescriptions: Prescription Medication Dispense Sig Line Antivert 25 mg Tab #20 (twenty) Take one tablet PO Q6 hours prn dizziness 12:37 02/19/2010 by Rhea Bleacher - PA, Mr. Discharge: Discharge Instructions: *free  text, *resource guide, vertigo - nonspecific Append a Note to Discharge Instructions: Please read and follow instructions today. Take medication as prescribed for dizziness. Return with worsening dizziness, weakness on one side of your body, or other concerns. Your liver tests are abnormal. You do not need to go to your lab appointment tomorrow because these tests were done today. You may get a phone call with an appointment to go see a liver doctor. Please keep your scheduled appointment with Dr. Fabian Sharp next week. Return with any concerns. Referral/Appointment Refer Patient To: Phone Number: Follow-up in Appointment Details: Rebecca Rich [phone] 7 days Drug Instructions: neuro antivert 12:40 02/19/2010 by Rhea Bleacher - PA, Mr. MDM: Amount and complexity of data: nursing notes reviewed, previous admission records reviewed, previous labs reviewed, previous x-ray(s) reviewed Comments: Pt with vertigo, improved with meclizine. Neuro exam otherwise normal. Doubt posterior CVA. Liver enzymes continue to increase. PCP contacted and follow-up arranged. 9 Rebecca Rich, Rebecca Rich MRN: [id number] Acct#: [id number] 13:34 02/19/2010 by Rhea Bleacher - PA, Mr. Imaging: Radiology Study Interpretation Comments Timestamp ultrasound per radiologist dilated common bile duct 13:34 02/19/2010 by Rhea Bleacher - PA, Mr. Cardiac: EKG EKG Time Rate Rhythm Axis PQRS & Intervals ST & T 08:24 AM 73 BPM first degree AV block, premature atrial contractions normal normal normal Compared with previous EKG Date Comparison Comments 07/14/2008 changed new 1st degree block 13:37 02/19/2010 by Rhea Bleacher - PA, Mr. Documentation completed by Responsible Physician 13:37 02/19/2010 by Rhea Bleacher - PA, Mr. PROVIDER: Dr. Samuel Rich - DO Chart electronically signed by ER Physician 15:29 02/19/2010 by Samuel Rich - DO, Dr. Attending: Supervision of: Midlevel: evaluation and management  procedures were performed by the PA/NP/CNM under my supervision/collaboration. 15:29 02/19/2010 by Samuel Rich - DO, Dr. Libby Maw orders: Verify orders: verify all orders 15:29 02/19/2010 by Samuel Rich - DO, Dr. Nikki Dom Varnika, Butz - MRN: [id number] Acct#: [id number] 11   Tell   patient I reviewed the ed note..    THe Ct scan report that she has spenoid sinusitis    and she should be on antibiotics and set up for  ent appt asap.   Assess how she is doing clinincally fever HA dizziness etc.  We will need alo to get Gi involved about her liver abnromalities.    Madelin Headings MD  February 21, 2010 10:09 AM    Left message on machine to call back. Rebecca Bolk, CMA (AAMA)  February 21, 2010 10:12 AM  Left message on machine  to call back. Rebecca Bolk, CMA (AAMA)  February 21, 2010 11:33 AM  Spoke to pt- she is not having a fever as of last night, taking medication for dizziness that is helping. She is still having vomiting but no diarrhea. Pt has a decreased appitite. She states that she has had the vomiting prior to the dizziness. [facility] on [address]. Pt aware of this and orders sent to [facility]. Rebecca Bolk, CMA Duncan Dull)  February 21, 2010 12:32 PM

## 2010-02-28 NOTE — Assessment & Plan Note (Signed)
Summary: 4 month rov/njr/pt rsc/cjr   Vital Signs:  Patient profile:   65 year old female Menstrual status:  hysterectomy Height:      62.25 inches Weight:      288 pounds BMI:     52.44 Pulse rate:   88 / minute BP sitting:   120 / 60  (left arm) Cuff size:   large  Vitals Entered By: Romualdo Bolk, CMA (AAMA) (February 11, 2010 11:21 AM) CC: Follow-up visit on labs, Hypertension Management   History of Present Illness: Rebecca Rich  comes in for follow up of multiple medical problems  had restarted  meds 1 week before    On new years day  of chills and   nausea  no vomiting.   had been off meds for at least  6 days  urine was super gold color. No blood .  advise to drink water    never went totally normal.   Bp up   143/60   at home.  150 range .  BG have been good. To travel soon.  No alcohol  had eaten extra sugars over holidays  No new meds   taking bioflex for a while for her joints.   Hypertension History:      She complains of peripheral edema, but denies headache, chest pain, palpitations, dyspnea with exertion, orthopnea, PND, visual symptoms, neurologic problems, syncope, and side effects from treatment.  She notes no problems with any antihypertensive medication side effects.        Positive major cardiovascular risk factors include female age 81 years old or older, diabetes, hyperlipidemia, and hypertension.  Negative major cardiovascular risk factors include non-tobacco-user status.      Preventive Screening-Counseling & Management  Alcohol-Tobacco     Alcohol drinks/day: 0     Smoking Status: never     Passive Smoke Exposure: no  Caffeine-Diet-Exercise     Caffeine use/day: <1     Does Patient Exercise: yes     Type of exercise: walking     Exercise (avg: min/session): 15     Times/week: 3  Current Medications (verified): 1)  Hydrochlorothiazide 25 Mg Tabs (Hydrochlorothiazide) .Marland Kitchen.. 1 By Mouth Once Daily 2)  Ramipril 10 Mg Caps (Ramipril) .Marland Kitchen.. 1  By Mouth Once Daily 3)  Lipitor 10 Mg  Tabs (Atorvastatin Calcium) .Marland Kitchen.. 1 By Mouth Once Daily 4)  Centrum Silver  Tabs (Multiple Vitamins-Minerals) 5)  Vitamin D 1000 Unit Tabs (Cholecalciferol)  Allergies (verified): No Known Drug Allergies  Past History:  Past medical, surgical, family and social histories (including risk factors) reviewed, and no changes noted (except as noted below).  Past Medical History: Reviewed history from 10/05/2008 and no changes required. Diabetes mellitus, type II Hypertension Osteoarthritis OSA Colonic polyps, hx of Echo lv nl mild ai calcium mv 2007   Keloids   Consults Dr. Luciana Axe  eye check Dr. Allen Derry Pulmonary Dr. Loreta Ave for GI care  Past Surgical History: Reviewed history from 07/28/2008 and no changes required. Hysterectomy  gets regular colonoscopies  Gallbladder June 2010  Past History:  Care Management: Surgery: Ezzard Standing Gastroenterology: Elnoria Howard Pulmonary: [facility] Podiatry: Cicero Duck  Family History: Reviewed history from 07/28/2008 and no changes required. Family History Diabetes 1st degree relatives sibs parent  nephew dm and dialysis   heart disease: 3 brothers, 1 sister cancer: father (throat), mother (cervical) 1 sister had a stroke.   Father: Throat cancer Mother: Cervical Cancer, blood clots Siblings: Sister-stroke, all had dm, heart attacks-brothers, Sister-heart attack,  another sister had DM Pt has 4 brother and 5 sisters  Social History: Reviewed history from 04/19/2008 and no changes required. 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 Alcohol use-no Drug use-no pt is divorced with children.      Review of Systems  The patient denies anorexia, fever, weight loss, weight gain, vision loss, decreased hearing, prolonged cough, melena, hematochezia, severe indigestion/heartburn, hematuria, genital sores, transient blindness, depression, unusual weight change, abnormal  bleeding, enlarged lymph nodes, and angioedema.    Physical Exam  General:  Well-developed,well-nourished,in no acute distress; alert,appropriate and cooperative throughout examination Head:  normocephalic and atraumatic.   Eyes:  vision grossly intact.    no icgerid  Neck:  No deformities, masses, or tenderness noted. Lungs:  normal respiratory effort, no intercostal retractions, and normal breath sounds.   Heart:  normal rate, regular rhythm, no gallop, and no lifts.   harsh 2/6  sem sitting  usb no radiation  Abdomen:  soft, non-tender, normal bowel sounds, no masses, no hepatomegaly, and no splenomegaly.   Pulses:  pulses intact without delay   Extremities:  no clubbing cyanosis or trc  edema Skin:  turgor normal, color normal, no suspicious lesions, and no ecchymoses.   Cervical Nodes:  No lymphadenopathy noted Psych:  Oriented X3, good eye contact, and not anxious appearing.   reviewed labs   Impression & Recommendations:  Problem # 1:  DIABETES MELLITUS, TYPE II (ICD-250.00) controlled  Her updated medication list for this problem includes:    Ramipril 10 Mg Caps (Ramipril) .Marland Kitchen... 1 by mouth once daily  Labs Reviewed: Creat: 1.1 (02/01/2010)     Last Eye Exam: normal (01/27/2009) Reviewed HgBA1c results: 6.8 (02/01/2010)  6.8 (08/02/2009)  Problem # 2:  LIVER FUNCTION TESTS, ABNORMAL (ICD-794.8) Assessment: New ? if re;lated to epsode of nausea and chills jan 1st .    had hx of abnormals but not to this extent  had rapid wieght gain with off meds and increase calories  now better   no cv signs  gi is better  Problem # 3:  OBESITY (ICD-278.00) Assessment: Deteriorated  Ht: 62.25 (02/11/2010)   Wt: 288 (02/11/2010)   BMI: 52.44 (02/11/2010)  Problem # 4:  HYPERLIPIDEMIA (ICD-272.2)  Her updated medication list for this problem includes:    Lipitor 10 Mg Tabs (Atorvastatin calcium) .Marland Kitchen... 1 by mouth once daily  Labs Reviewed: SGOT: 50 (02/01/2010)   SGPT: 122  (02/01/2010)  10 Yr Risk Heart Disease: 17 % Prior 10 Yr Risk Heart Disease: 15 % (05/10/2009)   HDL:44.00 (02/01/2010), 45.80 (09/28/2008)  LDL:64 (02/01/2010), 42 (09/28/2008)  Chol:128 (02/01/2010), 108 (09/28/2008)  Trig:100.0 (02/01/2010), 102.0 (09/28/2008)  Problem # 5:  HYPERTENSION (ICD-401.9)  Her updated medication list for this problem includes:    Hydrochlorothiazide 25 Mg Tabs (Hydrochlorothiazide) .Marland Kitchen... 1 by mouth once daily    Ramipril 10 Mg Caps (Ramipril) .Marland Kitchen... 1 by mouth once daily  BP today: 120/60 Prior BP: 120/70 (09/24/2009)  10 Yr Risk Heart Disease: 17 % Prior 10 Yr Risk Heart Disease: 15 % (05/10/2009)  Labs Reviewed: K+: 3.4 (02/01/2010) Creat: : 1.1 (02/01/2010)   Chol: 128 (02/01/2010)   HDL: 44.00 (02/01/2010)   LDL: 64 (02/01/2010)   TG: 100.0 (02/01/2010)  Problem # 6:  CARDIAC MURMUR (ICD-785.2)  mild ai grade 2 diastolic dysfunctnion optimize bp controll unsure of this good readings in office but up at home . may need t adjust meds  will follow .  Complete Medication List: 1)  Hydrochlorothiazide 25 Mg Tabs (Hydrochlorothiazide) .Marland Kitchen.. 1 by mouth once daily 2)  Ramipril 10 Mg Caps (Ramipril) .Marland Kitchen.. 1 by mouth once daily 3)  Lipitor 10 Mg Tabs (Atorvastatin calcium) .Marland Kitchen.. 1 by mouth once daily 4)  Centrum Silver Tabs (Multiple vitamins-minerals) 5)  Vitamin D 1000 Unit Tabs (Cholecalciferol) 6)  Bioflex Tabs (Bioflavonoid products) .Marland Kitchen.. 1-2 two times a day  Hypertension Assessment/Plan:      The patient's hypertensive risk group is category C: Target organ damage and/or diabetes.  Her calculated 10 year risk of coronary heart disease is 17 %.  Today's blood pressure is 120/60.  Her blood pressure goal is < 130/80.  Patient Instructions: 1)  stop the lipitor for now . 2)  Check LFTS  gamma GGT  and tsh and free t4     bmp next week and then OV  with results  before Feb 1st ). 3)  Avoid tylenol advil alcohol.   in the meantime . 4)  Call if Bp  readings are 150 and over    Orders Added: 1)  Est. Patient Level IV [04540]

## 2010-02-28 NOTE — Progress Notes (Signed)
Summary: refills  Phone Note From Pharmacy   Caller: [facility] Reason for Call: Needs renewal Summary of Call: hctz, lipitor and ramipril Initial call taken by: Romualdo Bolk, CMA (AAMA),  January 18, 2010 9:39 AM    Prescriptions: LIPITOR 10 MG  TABS (ATORVASTATIN CALCIUM) 1 by mouth once daily  #90 x 0   Entered by:   Romualdo Bolk, CMA (AAMA)   Authorized by:   Madelin Headings MD   Signed by:   Romualdo Bolk, CMA (AAMA) on 01/18/2010   Method used:   Electronically to        [facility]* (mail-order)       [address], [address] [place], [place]  [address]       Ph: [phone]       Fax: [phone]   RxID:   [id number] HYDROCHLOROTHIAZIDE 25 MG TABS (HYDROCHLOROTHIAZIDE) 1 by mouth once daily  #90 x 0   Entered by:   Romualdo Bolk, CMA (AAMA)   Authorized by:   Madelin Headings MD   Signed by:   Romualdo Bolk, CMA (AAMA) on 01/18/2010   Method used:   Electronically to        [facility]* (mail-order)       [address], [address] [place], [place]  [address]       Ph: [phone]       Fax: [phone]   RxID:   [id number] RAMIPRIL 10 MG CAPS (RAMIPRIL) 1 by mouth once daily  #90 Capsule x 0   Entered by:   Romualdo Bolk, CMA (AAMA)   Authorized by:   Madelin Headings MD   Signed by:   Romualdo Bolk, CMA (AAMA) on 01/18/2010   Method used:   Electronically to        [facility]* (mail-order)       [address], [address] [place]  [address]       Ph: [phone]       Fax: [phone]   RxID:   [id number]

## 2010-02-28 NOTE — Progress Notes (Signed)
Summary: refill  Phone Note From Pharmacy   Caller: [facility]  Reason for Call: Needs renewal Details for Reason: ramipril 10mg  Initial call taken by: Romualdo Bolk, CMA (AAMA),  February 19, 2010 10:17 AM    Prescriptions: RAMIPRIL 10 MG CAPS (RAMIPRIL) 1 by mouth once daily  #90 Capsule x 0   Entered by:   Romualdo Bolk, CMA (AAMA)   Authorized by:   Madelin Headings MD   Signed by:   Romualdo Bolk, CMA (AAMA) on 02/19/2010   Method used:   Electronically to        [facility]* (mail-order)       [address], [address] [place]  [address]       Ph: [phone]       Fax: [phone]   RxID:   [id number]

## 2010-03-01 ENCOUNTER — Ambulatory Visit (HOSPITAL_COMMUNITY)
Admission: RE | Admit: 2010-03-01 | Discharge: 2010-03-01 | Disposition: A | Discharge status: Home / Self Care | Payer: Medicare Other | Source: Ambulatory Visit | Attending: Gastroenterology | Admitting: Gastroenterology

## 2010-03-01 ENCOUNTER — Ambulatory Visit (HOSPITAL_COMMUNITY): Payer: Medicare Other

## 2010-03-01 DIAGNOSIS — K8020 Calculus of gallbladder without cholecystitis without obstruction: Secondary | ICD-10-CM

## 2010-03-01 DIAGNOSIS — K8071 Calculus of gallbladder and bile duct without cholecystitis with obstruction: Secondary | ICD-10-CM | POA: Insufficient documentation

## 2010-03-01 DIAGNOSIS — G4730 Sleep apnea, unspecified: Secondary | ICD-10-CM | POA: Insufficient documentation

## 2010-03-01 DIAGNOSIS — K8021 Calculus of gallbladder without cholecystitis with obstruction: Secondary | ICD-10-CM | POA: Insufficient documentation

## 2010-03-01 DIAGNOSIS — Z860100 Personal history of colon polyps, unspecified: Secondary | ICD-10-CM | POA: Insufficient documentation

## 2010-03-01 DIAGNOSIS — E119 Type 2 diabetes mellitus without complications: Secondary | ICD-10-CM | POA: Insufficient documentation

## 2010-03-01 DIAGNOSIS — I10 Essential (primary) hypertension: Secondary | ICD-10-CM | POA: Insufficient documentation

## 2010-03-01 HISTORY — DX: Calculus of gallbladder and bile duct without cholecystitis with obstruction: K80.71

## 2010-03-04 ENCOUNTER — Encounter: Payer: Self-pay | Admitting: Internal Medicine

## 2010-03-06 NOTE — Progress Notes (Signed)
----   Converted from flag ---- ---- 02/27/2010 11:45 AM, Christie Nottingham CMA (AAMA) wrote: Jeanene Erb the patient and she was not aware that Dr. Loreta Ave and Dr. Elnoria Howard see this kind of GI problem as well and wants to go back to see them. I told her that I was cancelling the appt on 03/07/10 with [place] and she can call there office to schedule a follow up visit. She agreed. I will notify Dr. Rosezella [place] office too.  ---- 02/27/2010 11:31 AM, Meryl Dare MD [facility] wrote: This pts 1/31 office note was forwarded to me. It says Dr. Loreta Ave is her GI MD in the note. Was this pt scheduled to see me in the office? Is she changing GI MDs? [place] ------------------------------

## 2010-03-06 NOTE — Consult Note (Signed)
Summary: [facility]  [facility]   Imported By: Maryln Gottron 02/28/2010 14:15:34  _____________________________________________________________________  External Attachment:    Type:   Image     Comment:   External Document

## 2010-03-06 NOTE — Assessment & Plan Note (Signed)
Summary: F/U ON LAB RESULTS//[facility]   Vital Signs:  Patient profile:   65 year old female Menstrual status:  hysterectomy Weight:      279 pounds Pulse rate:   78 / minute BP sitting:   130 / 80  (left arm) Cuff size:   large  Vitals Entered By: Romualdo Bolk, CMA (AAMA) (February 26, 2010 11:12 AM) CC: Follow-up visit on labs, Hypertension Management   History of Present Illness: Rebecca Rich comes in today  for follow up of a number of acute med problems.  Abnormal lfts  hepatitis with elevated alk phos pattern.  Seepast notes and ed visit .  has had  post pranial chills fever and abd pain and dark urine since december.  better now.   Dizzinesss and sphenoid sinsutiis. a lot better  on antibiotic augmentin given by ent  Dr Jenne Pane ...  to follow up ent  in a few weeks.  HT stable DM stable  Liver  ater and lemon water     urine   is lighter .     Off lipitor and only on bp meds.   Hypertension History:      She denies headache, chest pain, palpitations, dyspnea with exertion, orthopnea, PND, peripheral edema, visual symptoms, neurologic problems, syncope, and side effects from treatment.  She notes no problems with any antihypertensive medication side effects.        Positive major cardiovascular risk factors include female age 82 years old or older, diabetes, hyperlipidemia, and hypertension.  Negative major cardiovascular risk factors include non-tobacco-user status.      Preventive Screening-Counseling & Management  Alcohol-Tobacco     Alcohol drinks/day: 0     Smoking Status: never     Passive Smoke Exposure: no  Caffeine-Diet-Exercise     Caffeine use/day: <1     Does Patient Exercise: yes     Type of exercise: walking     Exercise (avg: min/session): 15     Times/week: 3  Current Medications (verified): 1)  Hydrochlorothiazide 25 Mg Tabs (Hydrochlorothiazide) .Marland Kitchen.. 1 By Mouth Once Daily 2)  Ramipril 10 Mg Caps (Ramipril) .Marland Kitchen.. 1 By Mouth Once Daily 3)  Centrum  Silver  Tabs (Multiple Vitamins-Minerals) 4)  Vitamin D 1000 Unit Tabs (Cholecalciferol) 5)  Bioflex  Tabs (Bioflavonoid Products) .Marland Kitchen.. 1-2 Two Times A Day 6)  Antivert 25 Mg Tabs (Meclizine Hcl) .Marland Kitchen.. 1 By Mouth Q 6 Hours As Needed 7)  Augmentin 875-125 Mg Tabs (Amoxicillin-Pot Clavulanate) .Marland Kitchen.. 1 By Mouth Two Times A Day  Allergies (verified): No Known Drug Allergies  Past History:  Past medical, surgical, family and social histories (including risk factors) reviewed, and no changes noted (except as noted below).  Past Medical History: Reviewed history from 10/05/2008 and no changes required. Diabetes mellitus, type II Hypertension Osteoarthritis OSA Colonic polyps, hx of Echo lv nl mild ai calcium mv 2007   Keloids   Consults Dr. Luciana Axe  eye check Dr. Allen Derry Pulmonary Dr. Loreta Ave for GI care  Past Surgical History: Reviewed history from 07/28/2008 and no changes required. Hysterectomy  gets regular colonoscopies  Gallbladder June 2010  Past History:  Care Management: Surgery: Ezzard Standing Gastroenterology: Elnoria Howard Pulmonary: [facility] Podiatry: Cicero Duck ENT: Jenne Pane  Family History: Reviewed history from 07/28/2008 and no changes required. Family History Diabetes 1st degree relatives sibs parent  nephew dm and dialysis   heart disease: 3 brothers, 1 sister cancer: father (throat), mother (cervical) 1 sister had a stroke.   Father: Throat cancer  Mother: Cervical Cancer, blood clots Siblings: Sister-stroke, all had dm, heart attacks-brothers, Sister-heart attack, another sister had DM Pt has 4 brother and 5 sisters  Social History: Reviewed history from 04/19/2008 and no changes required. 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 Alcohol use-no Drug use-no pt is divorced with children.      Review of Systems       The patient complains of anorexia.  The patient denies peripheral edema, prolonged cough, hemoptysis, melena,  hematochezia, severe indigestion/heartburn, difficulty walking, abnormal bleeding, enlarged lymph nodes, and angioedema.         some low back pain.  right ? no flank pain   or radiation.    see hpi  Physical Exam  General:  Well-developed,well-nourished,in no acute distress; alert,appropriate and cooperative throughout examination Head:  normocephalic and atraumatic.   Eyes:  minimal  tinge jaundice  Neck:  No deformities, masses, or tenderness noted. Lungs:  Normal respiratory effort, chest expands symmetrically. Lungs are clear to auscultation, no crackles or wheezes. Heart:  Normal rate and regular rhythm. S1 and S2 normal without gallop, murmur, click, rub or other extra sounds. Abdomen:  soft, normal bowel sounds, no guarding, no rigidity, and no splenomegaly.  midl tenderness ruq ? liver not enlarged by  percussion   8 cm Msk:  no joint warmth and no redness over joints.   Pulses:  nl  cap refill  Neurologic:  alert & oriented X3.  non focal  Skin:  turgor normal, color normal, no ecchymoses, and no petechiae.   Cervical Nodes:  No lymphadenopathy noted Psych:  Normal eye contact, appropriate affect. Cognition appears normal.   Diabetes Management Exam:    Eye Exam:       Eye Exam done elsewhere          Date: 04/27/2009          Results: normal          Done by: Dr. Luciana Axe see ed visit   and labs   Impression & Recommendations:  Problem # 1:  LIVER FUNCTION TESTS, ABNORMAL (ICD-794.8) mixed pattern  clinically better   ? cause   to see Gi  Orders: TLB-Hepatic/Liver Function Pnl (80076-HEPATIC) TLB-Ferritin (82728-FER) T-Hepatitis C Antibody ([address]-60454) T-Hepatitis B Core Antibody (09811-91478) T-Hepatitis B Surface Antigen (29562-13086) T-Hepatitis B Surface Antibody (57846-96295) T-AMA (28413) T-Antinuclear Antib (ANA) (24401-02725) T-Hepatitis A Antibody, IGM (36644-03474) Venipuncture (25956) Specimen Handling (99000)  Problem # 2:  THYROID FUNCTION TEST,  ABNORMAL (ICD-794.5) recheck today  Orders: TLB-TSH (Thyroid Stimulating Hormone) (84443-TSH) TLB-T4 (Thyrox), Free (38756-EP3I)  Problem # 3:  DIZZINESS (ICD-780.4) Assessment: Improved sounds like vertigo ? if realted to sinustis Her updated medication list for this problem includes:    Antivert 25 Mg Tabs (Meclizine hcl) .Marland Kitchen... 1 by mouth q 6 hours as needed  Problem # 4:  ACUTE SPHENOIDAL SINUSITIS (ICD-461.3) Assessment: Improved by ct  Her updated medication list for this problem includes:    Augmentin 875-125 Mg Tabs (Amoxicillin-pot clavulanate) .Marland Kitchen... 1 by mouth two times a day  Problem # 5:  DIABETES MELLITUS, TYPE II (ICD-250.00)  controlled Her updated medication list for this problem includes:    Ramipril 10 Mg Caps (Ramipril) .Marland Kitchen... 1 by mouth once daily  Labs Reviewed: Creat: 1.1 (02/01/2010)     Last Eye Exam: normal (04/27/2009) Reviewed HgBA1c results: 6.8 (02/01/2010)  6.8 (08/02/2009)  Complete Medication List: 1)  Hydrochlorothiazide 25 Mg Tabs (Hydrochlorothiazide) .Marland Kitchen.. 1 by mouth once daily  2)  Ramipril 10 Mg Caps (Ramipril) .Marland Kitchen.. 1 by mouth once daily 3)  Centrum Silver Tabs (Multiple vitamins-minerals) 4)  Vitamin D 1000 Unit Tabs (Cholecalciferol) 5)  Bioflex Tabs (Bioflavonoid products) .Marland Kitchen.. 1-2 two times a day 6)  Antivert 25 Mg Tabs (Meclizine hcl) .Marland Kitchen.. 1 by mouth q 6 hours as needed 7)  Augmentin 875-125 Mg Tabs (Amoxicillin-pot clavulanate) .Marland Kitchen.. 1 by mouth two times a day  Hypertension Assessment/Plan:      The patient's hypertensive risk group is category C: Target organ damage and/or diabetes.  Her calculated 10 year risk of coronary heart disease is 17 %.  Today's blood pressure is 130/80.  Her blood pressure goal is < 130/80.  Patient Instructions: 1)  You will be informed of lab results when available.   2)  keep Gi appt.  change as needed. 3)  Then plan follow up  4)  Continue to eat light.   Orders Added: 1)  TLB-Hepatic/Liver  Function Pnl [80076-HEPATIC] 2)  TLB-TSH (Thyroid Stimulating Hormone) [84443-TSH] 3)  TLB-Ferritin [82728-FER] 4)  T-Hepatitis C Antibody [04540-98119] 5)  T-Hepatitis B Core Antibody [14782-95621] 6)  T-Hepatitis B Surface Antigen [30865-78469] 7)  T-Hepatitis B Surface Antibody [86706-23590] 8)  T-AMA [87353] 9)  T-Antinuclear Antib (ANA) [62952-84132] 10)  TLB-T4 (Thyrox), Free [44010-UV2Z] 11)  T-Hepatitis A Antibody, IGM [36644-03474] 12)  Venipuncture [36415] 13)  Specimen Handling [99000] 14)  Est. Patient Level IV [25956]

## 2010-03-06 NOTE — Letter (Signed)
Summary: New Patient letter  [facility]  [address] [place], [place] [address]   Phone: [phone]  Fax: [phone]       02/25/2010 MRN: [id number]  [facility] [address] [place], [place]  [address]  Dear Ms. Thelma Barge,  Welcome to the Gastroenterology Division at [facility].    You are scheduled to see Dr.  Russella Dar on 03-07-10 at 10:45am on the 3rd floor at [facility], [address]  We ask that you try to arrive at our office 15 minutes prior to your appointment time to allow for check-in.  We would like you to complete the enclosed self-administered evaluation form prior to your visit and bring it with you on the day of your appointment.  We will review it with you.  Also, please bring a complete list of all your medications or, if you prefer, bring the medication bottles and we will list them.  Please bring your insurance card so that we may make a copy of it.  If your insurance requires a referral to see a specialist, please bring your referral form from your primary care physician.  Co-payments are due at the time of your visit and may be paid by cash, check or credit card.     Your office visit will consist of a consult with your physician (includes a physical exam), any laboratory testing he/she may order, scheduling of any necessary diagnostic testing (e.g. x-ray, ultrasound, CT-scan), and scheduling of a procedure (e.g. Endoscopy, Colonoscopy) if required.  Please allow enough time on your schedule to allow for any/all of these possibilities.    If you cannot keep your appointment, please call [phone] to cancel or reschedule prior to your appointment date.  This allows [place] the opportunity to schedule an appointment for another patient in need of care.  If you do not cancel or reschedule by 5 p.m. the business day prior to your appointment date, you will be charged a $50.00 late cancellation/no-show fee.    Thank you for  choosing [facility] for your medical needs.  We appreciate the opportunity to care for you.  Please visit [place] at our website  to learn more about our practice.                     Sincerely,                                                             The Gastroenterology Division

## 2010-03-07 ENCOUNTER — Ambulatory Visit: Payer: Self-pay | Admitting: Gastroenterology

## 2010-03-07 ENCOUNTER — Telehealth: Payer: Self-pay | Admitting: Internal Medicine

## 2010-03-07 NOTE — Telephone Encounter (Signed)
[facility] called to check status of refill req from 02/14/10 for pts HCTZ 25mg . Informed [facility] that this was sent in 02/15/10 for #90 with 0 refills, according to EMR med list. Issue resolved.

## 2010-03-26 ENCOUNTER — Other Ambulatory Visit: Payer: Self-pay | Admitting: Internal Medicine

## 2010-03-29 ENCOUNTER — Other Ambulatory Visit: Payer: Self-pay | Admitting: Internal Medicine

## 2010-04-12 ENCOUNTER — Telehealth: Payer: Self-pay | Admitting: *Deleted

## 2010-04-12 MED ORDER — RAMIPRIL 10 MG PO CAPS: 10.0000 mg | ORAL_CAPSULE | Freq: Every day | ORAL | Status: DC

## 2010-04-12 NOTE — Telephone Encounter (Signed)
rx sent to pharmacy

## 2010-04-29 ENCOUNTER — Telehealth: Payer: Self-pay | Admitting: *Deleted

## 2010-04-29 MED ORDER — HYDROCHLOROTHIAZIDE 25 MG PO TABS: 25.0000 mg | ORAL_TABLET | Freq: Every day | ORAL | Status: DC

## 2010-04-29 NOTE — Telephone Encounter (Signed)
Rx sent to pharmacy   

## 2010-04-30 ENCOUNTER — Ambulatory Visit: Payer: Self-pay | Admitting: Pulmonary Disease

## 2010-05-06 LAB — BASIC METABOLIC PANEL
BUN: 12 mg/dL (ref 6–23)
CO2: 29 mEq/L (ref 19–32)
Chloride: 104 mEq/L (ref 96–112)
Glucose, Bld: 132 mg/dL — ABNORMAL HIGH (ref 70–99)
Potassium: 4.4 mEq/L (ref 3.5–5.1)
Sodium: 141 mEq/L (ref 135–145)

## 2010-05-06 LAB — CBC
HCT: 36 % (ref 36.0–46.0)
Hemoglobin: 12.3 g/dL (ref 12.0–15.0)
MCHC: 34.1 g/dL (ref 30.0–36.0)
MCV: 87.6 fL (ref 78.0–100.0)
Platelets: 138 10*3/uL — ABNORMAL LOW (ref 150–400)
RDW: 14.7 % (ref 11.5–15.5)

## 2010-05-06 LAB — DIFFERENTIAL
Basophils Absolute: 0 10*3/uL (ref 0.0–0.1)
Eosinophils Absolute: 0.3 10*3/uL (ref 0.0–0.7)
Eosinophils Relative: 7 % — ABNORMAL HIGH (ref 0–5)
Monocytes Absolute: 0.4 10*3/uL (ref 0.1–1.0)

## 2010-05-07 LAB — GLUCOSE, CAPILLARY
Glucose-Capillary: 107 mg/dL — ABNORMAL HIGH (ref 70–99)
Glucose-Capillary: 111 mg/dL — ABNORMAL HIGH (ref 70–99)
Glucose-Capillary: 115 mg/dL — ABNORMAL HIGH (ref 70–99)
Glucose-Capillary: 117 mg/dL — ABNORMAL HIGH (ref 70–99)
Glucose-Capillary: 123 mg/dL — ABNORMAL HIGH (ref 70–99)
Glucose-Capillary: 134 mg/dL — ABNORMAL HIGH (ref 70–99)
Glucose-Capillary: 136 mg/dL — ABNORMAL HIGH (ref 70–99)
Glucose-Capillary: 143 mg/dL — ABNORMAL HIGH (ref 70–99)
Glucose-Capillary: 147 mg/dL — ABNORMAL HIGH (ref 70–99)
Glucose-Capillary: 148 mg/dL — ABNORMAL HIGH (ref 70–99)
Glucose-Capillary: 149 mg/dL — ABNORMAL HIGH (ref 70–99)
Glucose-Capillary: 154 mg/dL — ABNORMAL HIGH (ref 70–99)
Glucose-Capillary: 181 mg/dL — ABNORMAL HIGH (ref 70–99)
Glucose-Capillary: 183 mg/dL — ABNORMAL HIGH (ref 70–99)

## 2010-05-07 LAB — CBC
HCT: 35.9 % — ABNORMAL LOW (ref 36.0–46.0)
HCT: 37.9 % (ref 36.0–46.0)
HCT: 41.3 % (ref 36.0–46.0)
Hemoglobin: 11.9 g/dL — ABNORMAL LOW (ref 12.0–15.0)
Hemoglobin: 12.4 g/dL (ref 12.0–15.0)
Hemoglobin: 12.5 g/dL (ref 12.0–15.0)
MCHC: 33 g/dL (ref 30.0–36.0)
MCHC: 33.1 g/dL (ref 30.0–36.0)
MCV: 88.8 fL (ref 78.0–100.0)
MCV: 89 fL (ref 78.0–100.0)
Platelets: 163 10*3/uL (ref 150–400)
RBC: 3.99 MIL/uL (ref 3.87–5.11)
RBC: 4.15 MIL/uL (ref 3.87–5.11)
RBC: 4.26 MIL/uL (ref 3.87–5.11)
RDW: 14.4 % (ref 11.5–15.5)
RDW: 15.1 % (ref 11.5–15.5)
RDW: 15.7 % — ABNORMAL HIGH (ref 11.5–15.5)
WBC: 5.5 10*3/uL (ref 4.0–10.5)
WBC: 5.7 10*3/uL (ref 4.0–10.5)
WBC: 8.5 10*3/uL (ref 4.0–10.5)

## 2010-05-07 LAB — COMPREHENSIVE METABOLIC PANEL
ALT: 321 U/L — ABNORMAL HIGH (ref 0–35)
AST: 26 U/L (ref 0–37)
AST: 266 U/L — ABNORMAL HIGH (ref 0–37)
Albumin: 2.5 g/dL — ABNORMAL LOW (ref 3.5–5.2)
Albumin: 3.6 g/dL (ref 3.5–5.2)
Alkaline Phosphatase: 143 U/L — ABNORMAL HIGH (ref 39–117)
Alkaline Phosphatase: 164 U/L — ABNORMAL HIGH (ref 39–117)
Alkaline Phosphatase: 174 U/L — ABNORMAL HIGH (ref 39–117)
BUN: 10 mg/dL (ref 6–23)
BUN: 6 mg/dL (ref 6–23)
BUN: 6 mg/dL (ref 6–23)
CO2: 27 mEq/L (ref 19–32)
Chloride: 100 mEq/L (ref 96–112)
Chloride: 103 mEq/L (ref 96–112)
Chloride: 105 mEq/L (ref 96–112)
Creatinine, Ser: 0.81 mg/dL (ref 0.4–1.2)
GFR calc Af Amer: >60 mL/min (ref >60)
GFR calc Af Amer: >60 mL/min (ref >60)
GFR calc non Af Amer: >60 mL/min (ref >60)
Glucose, Bld: 105 mg/dL — ABNORMAL HIGH (ref 70–99)
Glucose, Bld: 147 mg/dL — ABNORMAL HIGH (ref 70–99)
Potassium: 3.3 mEq/L — ABNORMAL LOW (ref 3.5–5.1)
Potassium: 3.5 mEq/L (ref 3.5–5.1)
Potassium: 4.1 mEq/L (ref 3.5–5.1)
Potassium: 4.6 mEq/L (ref 3.5–5.1)
Sodium: 136 mEq/L (ref 135–145)
Sodium: 138 mEq/L (ref 135–145)
Total Bilirubin: 1.5 mg/dL — ABNORMAL HIGH (ref 0.3–1.2)
Total Bilirubin: 5.5 mg/dL — ABNORMAL HIGH (ref 0.3–1.2)
Total Bilirubin: 5.9 mg/dL — ABNORMAL HIGH (ref 0.3–1.2)
Total Protein: 5.6 g/dL — ABNORMAL LOW (ref 6.0–8.3)
Total Protein: 5.8 g/dL — ABNORMAL LOW (ref 6.0–8.3)
Total Protein: 6.3 g/dL (ref 6.0–8.3)
Total Protein: 7.1 g/dL (ref 6.0–8.3)

## 2010-05-07 LAB — HEMOGLOBIN A1C
Hgb A1c MFr Bld: 7.4 % — ABNORMAL HIGH (ref 4.6–6.1)
Mean Plasma Glucose: 166 mg/dL

## 2010-05-07 LAB — URINE CULTURE: Colony Count: 15000

## 2010-05-07 LAB — HEPATIC FUNCTION PANEL
AST: 140 U/L — ABNORMAL HIGH (ref 0–37)
AST: 91 U/L — ABNORMAL HIGH (ref 0–37)
Albumin: 2.8 g/dL — ABNORMAL LOW (ref 3.5–5.2)
Bilirubin, Direct: 3.8 mg/dL — ABNORMAL HIGH (ref 0.0–0.3)
Indirect Bilirubin: 2.1 mg/dL — ABNORMAL HIGH (ref 0.3–0.9)
Total Protein: 5.9 g/dL — ABNORMAL LOW (ref 6.0–8.3)

## 2010-05-07 LAB — URINALYSIS, ROUTINE W REFLEX MICROSCOPIC
Glucose, UA: NEGATIVE mg/dL
Hgb urine dipstick: NEGATIVE
Protein, ur: NEGATIVE mg/dL
Urobilinogen, UA: 0.2 mg/dL (ref 0.0–1.0)

## 2010-05-07 LAB — BASIC METABOLIC PANEL
Calcium: 8.2 mg/dL — ABNORMAL LOW (ref 8.4–10.5)
GFR calc Af Amer: >60 mL/min (ref >60)
GFR calc non Af Amer: 60 mL/min — ABNORMAL LOW (ref >60)
Sodium: 137 mEq/L (ref 135–145)

## 2010-05-07 LAB — DIFFERENTIAL
Basophils Absolute: 0 10*3/uL (ref 0.0–0.1)
Basophils Relative: 0 % (ref 0–1)
Eosinophils Absolute: 0.2 10*3/uL (ref 0.0–0.7)
Eosinophils Relative: 0 % (ref 0–5)
Lymphocytes Relative: 11 % — ABNORMAL LOW (ref 12–46)
Monocytes Absolute: 0.6 10*3/uL (ref 0.1–1.0)
Monocytes Relative: 6 % (ref 3–12)
Monocytes Relative: 7 % (ref 3–12)
Neutro Abs: 5.4 10*3/uL (ref 1.7–7.7)
Neutro Abs: 6.9 10*3/uL (ref 1.7–7.7)
Neutrophils Relative %: 77 % (ref 43–77)

## 2010-05-07 LAB — LIPASE, BLOOD
Lipase: 26 U/L (ref 11–59)
Lipase: 93 U/L — ABNORMAL HIGH (ref 11–59)

## 2010-05-07 LAB — BRAIN NATRIURETIC PEPTIDE: Pro B Natriuretic peptide (BNP): 160 pg/mL — ABNORMAL HIGH (ref 0.0–100.0)

## 2010-05-07 LAB — URINE MICROSCOPIC-ADD ON

## 2010-05-07 LAB — AMYLASE
Amylase: 211 U/L — ABNORMAL HIGH (ref 27–131)
Amylase: 51 U/L (ref 27–131)
Amylase: 971 U/L — ABNORMAL HIGH (ref 27–131)

## 2010-05-07 LAB — TSH: TSH: 4.679 u[IU]/mL — ABNORMAL HIGH (ref 0.350–4.500)

## 2010-05-07 LAB — T4, FREE: Free T4: 1.22 ng/dL (ref 0.80–1.80)

## 2010-05-21 ENCOUNTER — Encounter: Payer: Self-pay | Admitting: Pulmonary Disease

## 2010-05-23 ENCOUNTER — Encounter: Payer: Self-pay | Admitting: Pulmonary Disease

## 2010-05-23 ENCOUNTER — Ambulatory Visit (INDEPENDENT_AMBULATORY_CARE_PROVIDER_SITE_OTHER): Payer: Medicare Other | Admitting: Pulmonary Disease

## 2010-05-23 VITALS — BP 142/70 | HR 80 | Temp 98.6°F | Ht 61.0 in | Wt 297.8 lb

## 2010-05-23 DIAGNOSIS — G4733 Obstructive sleep apnea (adult) (pediatric): Secondary | ICD-10-CM

## 2010-05-23 NOTE — Assessment & Plan Note (Addendum)
The pt is doing well with cpap, and feels that she sleeps well with adequate daytime alertness.  She has no issues with her mask or pressure, but is overdue for supplies.  Will send an order to her dme for this, and have encouraged her to work aggressively on weight loss.  She is to f/u with me in one year.

## 2010-05-23 NOTE — Progress Notes (Signed)
  Subjective:    Patient ID: Rebecca Rich, female    DOB: 1945-09-04, 65 y.o.   MRN: [id number]  HPI The pt comes in today for f/u of her known osa.  She is wearing cpap compliantly by her history, and reports no issues with mask fit or pressure.  She is sleeping well with t the device, and denies significant EDS.  She is overdue for supplies.     Review of Systems  Constitutional: Negative for fever and unexpected weight change.  HENT: Positive for rhinorrhea and dental problem. Negative for ear pain, nosebleeds, congestion, sore throat, sneezing, trouble swallowing, postnasal drip and sinus pressure.   Eyes: Positive for itching. Negative for redness.  Respiratory: Positive for cough. Negative for chest tightness, shortness of breath and wheezing.   Cardiovascular: Positive for palpitations and leg swelling.  Gastrointestinal: Negative for nausea and vomiting.  Genitourinary: Negative for dysuria.  Musculoskeletal: Positive for joint swelling.  Skin: Negative for rash.  Neurological: Negative for headaches.  Hematological: Bruises/bleeds easily.  Psychiatric/Behavioral: Negative for dysphoric mood. The patient is not nervous/anxious.        Objective:   Physical Exam Obese female in nad  No skin breakdown or pressure necrosis from cpap mask LE with 1+ edema, no cyanosis noted Alert, completely awake, moves all 4        Assessment & Plan:

## 2010-05-23 NOTE — Patient Instructions (Signed)
Continue with cpap.  We will order new supplies for you. Work on weight loss followup with me in one year

## 2010-05-27 ENCOUNTER — Encounter: Payer: Self-pay | Admitting: Pulmonary Disease

## 2010-06-11 NOTE — Consult Note (Signed)
NAME:  Rebecca Rich, Rebecca Rich NO.:  [id number]   MEDICAL RECORD NO.:  [id number]          PATIENT TYPE:  INP   LOCATION:  1342                         FACILITY:  [facility]   PHYSICIAN:  Velora Heckler, MD      DATE OF BIRTH:  Jul 07, 1945   DATE OF CONSULTATION:  06/17/2008  DATE OF DISCHARGE:                                 CONSULTATION   REFERRING PHYSICIANS:  1. Dr. Randa Evens, Emergency Department [facility].  2. Dr. Lavera Guise, [facility].   REASONS FOR CONSULTATION:  Cholelithiasis, choledocholithiasis,  abdominal pain.   PRIMARY PHYSICIAN:  Berniece Andreas.   HISTORY OF PRESENT ILLNESS:  Rebecca Rich is a 65 year old black female  from [place], [place].  She presented to the emergency department with 3-  day history of abdominal pain, nausea, vomiting and constipation.  The  patient was seen and evaluated by Dr. Randa Evens in the emergency  department.  Laboratory studies showed markedly elevated liver function  test.  Abdominal ultrasound was obtained which showed tiny gallstones, a  dilated common bile duct, and suspicion of choledocholithiasis.  General  surgery was notified.  Gastroenterology was consulted.  The patient was  admitted on the [facility] service by Dr. Lavera Guise.   PAST MEDICAL HISTORY:  1. History of morbid obesity.  2. History of obstructive sleep apnea.  3. History of type 2 diabetes mellitus.  4. History of hypertension.  5. Status post abdominal hysterectomy in 1985.   MEDICATIONS:  Ramipril, hydrochlorothiazide, Lipitor, multivitamins.   ALLERGIES:  None known.   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  She denies tobacco use.  She denies  alcohol use.   FAMILY HISTORY:  Noted in history of present illness.   REVIEW OF SYSTEMS:  A 15 system review without other significant  findings except as noted above.   EXAM:  GENERAL:  A 65 year old black female, morbidly  obese, on ward  [facility].  Temperature 98.1, pulse 74,  respirations 18, blood pressure 131/54, O2 saturation 93% room air.  HEENT:  Shows her to be normocephalic/atraumatic, sclerae appear  injected although not definitely icteric.  Dentition fair.  Mucous  membranes moist.  Voice normal.  Palpation of the neck shows no lymphadenopathy, no mass, no tenderness.  LUNGS:  Clear to auscultation bilaterally without rales, rhonchi or  wheeze.  CARDIAC:  Shows regular rate and rhythm without murmur.  Peripheral  pulses are full.  EXTREMITIES:  Show 1+ edema at the ankles.  ABDOMEN:  Soft, obese, nondistended.  There is a wound in the  epigastrium with keloid formation measuring approximately 5 cm in  length.  There is a well-healed lower midline surgical incision  consistent with previous hysterectomy.  On auscultation there are a few  scattered bowel sounds.  Palpation reveals mild to moderate tenderness  in the right upper quadrant and epigastrium.  There is voluntary  guarding.  There is no palpable mass.  The remainder of the abdomen is  soft and nontender.  NEUROLOGICALLY:  The patient is alert and oriented without focal  deficit.  There is no sign of tremor.   LABORATORY STUDIES:  White count 5.5, hemoglobin 12.4, platelet count  131,000, potassium 3.3, total bilirubin 5.5, alkaline phosphatase 174,  SGOT 183, SGPT 321, amylase 51, lipase 26.   RADIOGRAPHIC STUDIES:  Ultrasound abdomen performed on May 21 shows  multiple tiny gallstones.  There is a dilated common bile duct and there  is suspicion of common bile duct stones.   IMPRESSION:  1. Cholelithiasis.  2. Likely choledocholithiasis.  3. Abdominal pain.   PLAN:  The patient is admitted on the [facility].  They  are managing her concurrent medical issues.  The patient has been seen  by gastroenterology and I have discussed her case with Dr. Lina Sar.  The patient will likely undergo  ERCP for stone extraction on May 23.  We  will tentatively consider a trip to the operating room on May 24 for  laparoscopic cholecystectomy pending the ERCP results.   I have discussed this with the patient and she understands and agrees to  proceed.      Velora Heckler, MD  Electronically Signed     TMG/MEDQ  D:  06/17/2008  T:  06/17/2008  Job:  [id number]   cc:   Lonia Blood, M.D.   Dr. Randa Evens  Emergency Department   Hedwig Morton. Juanda Chance, MD  [address]  [place]  [place] [address]

## 2010-06-11 NOTE — Discharge Summary (Signed)
NAME:  Rebecca Rich, Rebecca Rich                ACCOUNT NO.:  [id number]   MEDICAL RECORD NO.:  [id number]          PATIENT TYPE:  INP   LOCATION:  1343                         [facility]ACILITY[facility]  [facility]   PHYSICIAN:  Valetta Mole. Swords, MD    DATE OF BIRTH:  11-05-1945   DATE OF ADMISSION:  06/16/2008  DATE OF DISCHARGE:  06/23/2008                               DISCHARGE SUMMARY   DISCHARGE DIAGNOSES:  1. Acute cholecystitis.  2. Status post open cholecystectomy.  3. Retained common bile duct stones.  4. Obstructive sleep apnea.  5. Diabetes mellitus type 2.  6. Hypertension.  7. Osteoarthritis.  8. Post endoscopic retrograde cholangiopancreatogram pancreatitis.   DISCHARGE MEDICATIONS:  See home med rec form.   HOSPITAL CONSULTATIONS:  1. General surgery, Dr. Ezzard Standing.  2. Gastroenterology, Dr. Elnoria Howard.   HOSPITAL PROCEDURES:  1. Open cholecystectomy performed Jun 19, 2008.  2. ERCP performed by Dr. Juanda Chance on Jun 16, 2008.   DISCHARGE LABORATORY DATA:  CMET on Jun 22, 2008 was normal except for a  glucose of 130, total bilirubin 1.5, alkaline phosphatase 143, SGPT 97,  total protein 5.6, albumin 2.5, calcium 8.3.  CBC on Jun 20, 2008 normal  except for hemoglobin of 11.9.  Amylase on Jun 19, 2008 was 971 (normal  on admission).  Hemoglobin A1c 7.4%.   HOSPITAL COURSE:  The patient admitted to the Hospitalist service on Jun 16, 2008, see admission note and consultation note for details.  The  patient admitted with abdominal pain.  The patient found to have  cholecystitis.  Underwent ERCP followed by a laparoscopic converted to  open cholecystectomy.  After cholecystectomy patient has done quite  well.  At the time of discharge she is ambulating in halls without  difficulty.  She is eating a regular diet without difficulty.  She has  no significant abdominal pain except for tightness around the incision.  The patient has instructions to have staples removed at Dr. Allene Pyo  office in 5 - 7 days.   Obstructive sleep apnea, she did well on CPAP.   Diabetes controlled in the hospital.  Note A1c is elevated.  Weight loss  encouraged.   Other medical problems stable in the hospital.   CONDITION ON DISCHARGE:  Improved.   FOLLOWUP PLANS:  1. Dr. Ezzard Standing in 5 - 7 days.  2. Dr. Fabian Sharp 1 - 2 weeks.  3. Dr. Elnoria Howard 1 - 2 weeks.   DISPOSITION:  Patient is medically stable to go home.  She does live  alone.  I have asked case manager to see her to make sure that patient  is comfortable going home and is safe.  If not, she will stay in the  hospital until tomorrow.      Bruce Rexene Edison Swords, MD  Electronically Signed     BHS/MEDQ  D:  06/23/2008  T:  06/23/2008  Job:  [id number]
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
## 2010-06-11 NOTE — H&P (Signed)
NAME:  Rebecca Rich, Rebecca Rich NO.:  [id number]   MEDICAL RECORD NO.:  [id number]          PATIENT TYPE:  INP   LOCATION:  0105                         FACILITY:  [facility]   PHYSICIAN:  Lonia Blood, M.D.       DATE OF BIRTH:  07-17-1945   DATE OF ADMISSION:  06/16/2008  DATE OF DISCHARGE:                              HISTORY & PHYSICAL   PRIMARY CARE PHYSICIAN:  Dr. Berniece Andreas.   CHIEF COMPLAINT:  Abdominal pain.   HISTORY OF PRESENT ILLNESS:  Mrs. Ryser is a 65 year old African  American woman with history of obesity, diabetes, and sleep apnea who  presented to the [facility] Emergency Room with 72 hours of severe  right upper quadrant abdominal pain, nausea, vomiting, and constipation.  Patient has no history of prior major abdominal surgeries.  She was  investigated in the emergency room and found to have dilated common bile  duct.  She was referred for admission.   PAST MEDICAL HISTORY:  1. Obstructive sleep apnea.  2. Diabetes mellitus, type 2.  3. Obesity.  4. Hypertension.  5. Osteoarthritis.  6. Total hysterectomy with bilateral salpingo-oophorectomy in 1985.   HOME MEDICATIONS:  1. Ramipril.  2. Hydrochlorothiazide.  3. Multivitamin.  4. Lipitor.   ALLERGIES:  NO KNOWN DRUG ALLERGIES.   SOCIAL HISTORY:  Patient is retired.  She used to be a [employer]ologist.  Never smoked cigarettes.  Does not drink alcohol.   FAMILY HISTORY:  Positive for coronary artery disease in siblings and  parents.   REVIEW OF SYSTEMS:  As per HPI.  All other system review is negative.   PHYSICAL EXAM:  Upon admission, temperature is 98.7, heart rate 76,  blood pressure 137/58, respiratory rate 14, saturation 96% on room air.  Patient is alert, oriented, no acute distress.  HEAD:  Normocephalic, atraumatic.  EYES:  Pupils equal, round, and reactive to light and accommodation.  Sclerae are icteric.  Conjunctivae are pink.  THROAT:  Clear.  NECK:  Supple.  No JVD.   No carotid bruits.  CHEST:  Clear to auscultation without wheezes, rhonchi, or crackles.  HEART:  Regular rate and rhythm without murmurs, rubs, or gallops.  ABDOMEN:  Obese and soft.  There is some tenderness to deep palpation in  the right upper quadrant without Murphy sign.  No rebound.  No guarding.  Bowel sounds are present.  LOWER EXTREMITIES:  Without edema.  SKIN:  Warm and dry without any suspicious looking rashes.  NEUROLOGIC:  Cranial nerves II-XII intact.  Strength 5/5 in all 4  extremities.  Sensation intact.   LABORATORY VALUES:  At of admission, lactic acid 1.5, lipase 23, sodium  is 136, potassium 4.6, chloride 100, bicarbonate 27, BUN 10, creatinine  0.8, glucose 169, total bilirubin 5.9, alkaline phosphatase 192, AST  266, ALT 392, albumin 3.6, calcium 9.1, white blood cell count is 8.5,  hemoglobin 13.7, platelet count is 163.  Abdominal ultrasound indicates  a dilated common bile duct with negative Murphy sign and small  cholelithiasis.   ASSESSMENT AND PLAN:  1. Choledocholithiasis  with cholangitis.  The patient is currently      hemodynamically stable without signs of sepsis.  Plan is to admit      her to the hospital, place her on bowel rest, intravenous fluids,      and empiric intravenous antibiotics of Zosyn.  Gastrointestinal      consultation with Dr. Lina Sar has been called.  Also, surgical      consultation with Dr. Darnell Level has been called..  2. Diabetes mellitus.  While the patient is n.p.o., we will use      sliding scale insulin and correct for hypoglycemia.  3. Obstructive sleep apnea.  We will continue the patient's home CPAP.  4. Hypertension.  For now, we will be holding all the patient's      medication while she is n.p.o. and we will resume them as soon as      she is eating again.  5. Gastrointestinal prophylaxis, we will be using Protonix.  6. Deep venous thrombosis prophylaxis, we will be using Lovenox.      Lonia Blood, M.D.   Electronically Signed     SL/MEDQ  D:  06/16/2008  T:  06/16/2008  Job:  [id number]   cc:   Neta Mends. Fabian Sharp, MD  [address] [place], [place] [address]

## 2010-06-14 NOTE — Procedures (Signed)
[place]  Patient:    Rebecca Rich, Rebecca Rich                      MRN: [id number] Proc. Date: 02/06/99 Attending:  Anselmo Rod, M.D. CC:         Neta Mends. Panosh, M.D. [facility]                           Procedure Report  DATE OF BIRTH:  REFERRING PHYSICIAN:  Neta Mends. Panosh, M.D.  PROCEDURE PERFORMED:  Colonoscopy.  ENDOSCOPIST:  Anselmo Rod, M.D.  INSTRUMENT USED:  Olympus video colonoscope.  INDICATIONS FOR PROCEDURE:  The patient is a 65 year old black female with a previous history of polyps, rule out recurrent polyps.  PREPROCEDURE PREPARATION:  Informed consent was procured from the patient.  The  patient was fasted for eight hours prior to the procedure and prepped with a bottle of magnesium citrate and a gallon of NuLytely the night prior to the procedure.  PREPROCEDURE PHYSICAL:  The patient had stable vital signs.  Neck supple. Chest clear to auscultation.  S1, S2 regular.  Abdomen soft with normal abdominal bowel sounds.  DESCRIPTION OF PROCEDURE:  The patient was placed in the left lateral decubitus  position and sedated with 30 mg of Demerol and 3 mg of Versed intravenously. Once the patient was adequately sedated and maintained on low-flow oxygen and continuous cardiac monitoring, the Olympus video colonoscope was advanced from the rectum o the cecum without difficulty.  There was some residual stool in the colon but no masses, polyps, erosions or ulcerations were seen.  The patient tolerated the procedure well without complication.  There were small nonbleeding internal hemorrhoids seen on retroflexion in the rectum.  IMPRESSION:  Normal colonoscopy except for small nonbleeding internal hemorrhoids.  RECOMMENDATIONS:  Repeat colonoscopy is recommended in the next five years unless the patient develops symptoms prior to that.DD:  02/06/99 TD:  02/06/99 Job: [id number]
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
## 2010-07-25 ENCOUNTER — Other Ambulatory Visit: Payer: Self-pay | Admitting: Internal Medicine

## 2010-07-26 NOTE — Telephone Encounter (Signed)
When do you want pt to come back in? She doesn't have an appt schduled.

## 2010-08-08 ENCOUNTER — Telehealth: Payer: Self-pay | Admitting: *Deleted

## 2010-08-08 MED ORDER — RAMIPRIL 10 MG PO CAPS: ORAL_CAPSULE | ORAL | Status: DC

## 2010-08-08 NOTE — Telephone Encounter (Signed)
rx sent to pharmacy

## 2010-08-12 NOTE — Telephone Encounter (Signed)
Left message for pt to call back. Unsure of what medication she needs refilled.

## 2010-08-12 NOTE — Telephone Encounter (Signed)
Call patient  She is due for 6 months check but if she is in [place] cant do this .  Refill meds for 3 months so she doesn't run out.   Unclear about lab follow up from EHR at this time.

## 2010-08-12 NOTE — Telephone Encounter (Signed)
Pt aware of this. She is [place] for now. Pt to call back to schedule a follow up appt.

## 2010-10-09 ENCOUNTER — Telehealth: Payer: Self-pay | Admitting: *Deleted

## 2010-10-09 MED ORDER — RAMIPRIL 10 MG PO CAPS: ORAL_CAPSULE | ORAL | Status: DC

## 2010-10-09 NOTE — Telephone Encounter (Signed)
rx sent to pharmacy

## 2010-10-14 ENCOUNTER — Other Ambulatory Visit: Payer: Self-pay | Admitting: *Deleted

## 2010-10-14 MED ORDER — RAMIPRIL 10 MG PO CAPS: ORAL_CAPSULE | ORAL | Status: DC

## 2010-11-20 ENCOUNTER — Other Ambulatory Visit: Payer: Self-pay | Admitting: Internal Medicine

## 2010-12-09 ENCOUNTER — Telehealth: Payer: Self-pay | Admitting: *Deleted

## 2010-12-09 MED ORDER — RAMIPRIL 10 MG PO CAPS: ORAL_CAPSULE | ORAL | Status: DC

## 2010-12-09 NOTE — Telephone Encounter (Signed)
Refill on altace. Letter sent to pt.

## 2011-02-12 ENCOUNTER — Encounter: Payer: Self-pay | Admitting: Internal Medicine

## 2011-02-20 ENCOUNTER — Ambulatory Visit (INDEPENDENT_AMBULATORY_CARE_PROVIDER_SITE_OTHER): Payer: Medicare Other | Admitting: Internal Medicine

## 2011-02-20 ENCOUNTER — Encounter: Payer: Self-pay | Admitting: Internal Medicine

## 2011-02-20 VITALS — BP 154/80 | HR 98 | Temp 98.1°F | Ht 61.0 in | Wt 300.0 lb

## 2011-02-20 DIAGNOSIS — G4733 Obstructive sleep apnea (adult) (pediatric): Secondary | ICD-10-CM

## 2011-02-20 DIAGNOSIS — E119 Type 2 diabetes mellitus without complications: Secondary | ICD-10-CM | POA: Diagnosis not present

## 2011-02-20 DIAGNOSIS — E782 Mixed hyperlipidemia: Secondary | ICD-10-CM

## 2011-02-20 DIAGNOSIS — R82998 Other abnormal findings in urine: Secondary | ICD-10-CM | POA: Diagnosis not present

## 2011-02-20 DIAGNOSIS — R946 Abnormal results of thyroid function studies: Secondary | ICD-10-CM

## 2011-02-20 DIAGNOSIS — I10 Essential (primary) hypertension: Secondary | ICD-10-CM | POA: Diagnosis not present

## 2011-02-20 DIAGNOSIS — R05 Cough: Secondary | ICD-10-CM

## 2011-02-20 DIAGNOSIS — L910 Hypertrophic scar: Secondary | ICD-10-CM

## 2011-02-20 DIAGNOSIS — R059 Cough, unspecified: Secondary | ICD-10-CM | POA: Insufficient documentation

## 2011-02-20 DIAGNOSIS — R8290 Unspecified abnormal findings in urine: Secondary | ICD-10-CM | POA: Insufficient documentation

## 2011-02-20 LAB — CBC WITH DIFFERENTIAL/PLATELET
Basophils Absolute: 0 10*3/uL (ref 0.0–0.1)
Eosinophils Absolute: 0.2 10*3/uL (ref 0.0–0.7)
HCT: 40.9 % (ref 36.0–46.0)
Lymphs Abs: 1.6 10*3/uL (ref 0.7–4.0)
MCHC: 33.8 g/dL (ref 30.0–36.0)
MCV: 89.4 fl (ref 78.0–100.0)
Monocytes Absolute: 0.7 10*3/uL (ref 0.1–1.0)
Neutrophils Relative %: 57.7 % (ref 43.0–77.0)
Platelets: 161 10*3/uL (ref 150.0–400.0)
RDW: 14.8 % — ABNORMAL HIGH (ref 11.5–14.6)

## 2011-02-20 LAB — BASIC METABOLIC PANEL
BUN: 16 mg/dL (ref 6–23)
Chloride: 105 mEq/L (ref 96–112)
Creatinine, Ser: 1 mg/dL (ref 0.4–1.2)
Glucose, Bld: 141 mg/dL — ABNORMAL HIGH (ref 70–99)
Potassium: 3.8 mEq/L (ref 3.5–5.1)

## 2011-02-20 LAB — POCT URINALYSIS DIPSTICK
Bilirubin, UA: NEGATIVE
Blood, UA: NEGATIVE
Glucose, UA: NEGATIVE
Spec Grav, UA: >=1.03
pH, UA: 5

## 2011-02-20 LAB — LIPID PANEL
Cholesterol: 155 mg/dL (ref 0–200)
HDL: 53.4 mg/dL (ref >39.00)
LDL Cholesterol: 83 mg/dL (ref 0–99)
Triglycerides: 95 mg/dL (ref 0.0–149.0)
VLDL: 19 mg/dL (ref 0.0–40.0)

## 2011-02-20 LAB — HEPATIC FUNCTION PANEL
ALT: 15 U/L (ref 0–35)
Total Bilirubin: 0.8 mg/dL (ref 0.3–1.2)

## 2011-02-20 MED ORDER — RAMIPRIL 10 MG PO CAPS: 10.0000 mg | ORAL_CAPSULE | Freq: Two times a day (BID) | ORAL | Status: DC

## 2011-02-20 NOTE — Patient Instructions (Signed)
Will notify you  of labs when available. You need to lose weight  And change habits  As before.  We should increase your ramipril to 20 mg per day.  In the meantime.

## 2011-02-20 NOTE — Progress Notes (Signed)
  Subjective:    Patient ID: Rebecca Rich, female    DOB: 1945-03-23, 67 y.o.   MRN: [id number]  HPI  Patient comes in today for follow up of  multiple medical problems.  issues  Intermittent  Dark urine   More with less  Voiding.  No uti sx   Not taking sugars is taking 81 mg asa about qod nose   Recently having tooth work lengthening and crown.   [place]  Almost a years ago  [facility].  Tripped on curb no injury  Scar keloids tender at times no infection  Obesity gained 30 # away from pool and lifestyle indiscretion recently after abd surgery. No cp syncope feet ulcers or callus  HT  Taking meds thinks up from weight gain  Has had a cough since after x mas  ? A cold no sob wheezing now and no fever .doesnt think its the medication Review of Systems Neg cp swelling bleeding   Has lots of skin tags  ? Moles has ? About these.  Rest of ros neg or as per hpi  Past history family history social history reviewed in the electronic medical record.     Objective:   Physical Exam WDWN in nad  ocass dry irritative cough  HEENT [place] tms clear op no lesion airway patent no redness nose: no dc Neck: Supple without adenopathy or masses or bruits thyroid palpable  Chest:  Clear to A&P without wheezes rales or rhonchi CV:  S1-S2 no gallops or murmurs peripheral perfusion is normal Abdomen:  Sof,t normal bowel sounds without hepatosplenomegaly, no guarding rebound or masses no CVA tenderness keloid scars and large  No fluid wave No clubbing cyanosis or edema Feet no callus or ulcers Skin keloids abd and neck  Skin tags on neck and face  No suspicious areas.       Assessment & Plan:  DM controlled by ls need monitoring  HT  Increase readings   Poss from weight gain  Intensify lifestyle interventions. And increase altace to 20 per day.  Obesity  As above  To lose weight.  OSA  Under rx.   Cough nl exam   Consider acei cough if  persistent or progressive pulse ox good now 98 Hx  of abn urine color   Nl exam today might just be hydration issues  Will follow.

## 2011-02-24 ENCOUNTER — Other Ambulatory Visit: Payer: Self-pay | Admitting: Internal Medicine

## 2011-02-24 NOTE — Progress Notes (Signed)
Quick Note:  Pt aware of results. ______ 

## 2011-02-25 ENCOUNTER — Other Ambulatory Visit: Payer: Self-pay | Admitting: Internal Medicine

## 2011-02-25 DIAGNOSIS — I10 Essential (primary) hypertension: Secondary | ICD-10-CM

## 2011-02-25 DIAGNOSIS — E119 Type 2 diabetes mellitus without complications: Secondary | ICD-10-CM

## 2011-03-25 ENCOUNTER — Ambulatory Visit (INDEPENDENT_AMBULATORY_CARE_PROVIDER_SITE_OTHER): Payer: Medicare Other | Admitting: Internal Medicine

## 2011-03-25 ENCOUNTER — Encounter: Payer: Self-pay | Admitting: Internal Medicine

## 2011-03-25 VITALS — BP 120/80 | HR 92 | Temp 98.8°F | Wt 300.0 lb

## 2011-03-25 DIAGNOSIS — R2230 Localized swelling, mass and lump, unspecified upper limb: Secondary | ICD-10-CM | POA: Insufficient documentation

## 2011-03-25 DIAGNOSIS — R252 Cramp and spasm: Secondary | ICD-10-CM | POA: Insufficient documentation

## 2011-03-25 DIAGNOSIS — R229 Localized swelling, mass and lump, unspecified: Secondary | ICD-10-CM | POA: Diagnosis not present

## 2011-03-25 DIAGNOSIS — I10 Essential (primary) hypertension: Secondary | ICD-10-CM | POA: Diagnosis not present

## 2011-03-25 DIAGNOSIS — E119 Type 2 diabetes mellitus without complications: Secondary | ICD-10-CM

## 2011-03-25 DIAGNOSIS — R946 Abnormal results of thyroid function studies: Secondary | ICD-10-CM

## 2011-03-25 LAB — BASIC METABOLIC PANEL
BUN: 15 mg/dL (ref 6–23)
CO2: 27 mEq/L (ref 19–32)
Calcium: 9.4 mg/dL (ref 8.4–10.5)
Glucose, Bld: 111 mg/dL — ABNORMAL HIGH (ref 70–99)
Sodium: 142 mEq/L (ref 135–145)

## 2011-03-25 LAB — T4, FREE: Free T4: 0.85 ng/dL (ref 0.60–1.60)

## 2011-03-25 NOTE — Progress Notes (Signed)
  Subjective:    Patient ID: Rebecca Rich, female    DOB: February 01, 1945, 66 y.o.   MRN: [id number]  HPI Patient comes in today for  For new problem evaluation. She has had episodes of thigh pain since last visit.  Has happened twice  Middle of night and  Woke her up with cramping severe  pain  radiating down left groin  to knee on the medial side and it was hard to straigten out leg ; then started on right thigh and not as severe. Episodes lasted about a half an hour and resolved. However it reoccurred recently while she was sitting watching TV at about 2 AM it wasn't as severe but it was intense.  No known triggers falling she has some low back pain and lateral hip pain at times but this was different pain never went down to her foot no color change bruising bleeding.   Also of note she has a somewhat tender nodule on the left palm unrelated to above hurts only when she puts pressure on her palm. Good range of motion of her hand  She states her diabetes is doing better she is taking a supplement from herbal life that suppresses her appetite. States that her symptoms as above started before she took the herbal life supplements  Review of Systems Negative for chest pain shortness of breath syncope vision changes as above  Past history family history social history reviewed in the electronic medical record.     Objective:   Physical Exam Well-developed well-nourished in no acute distress today Examination of the abdomen shows large without masses no focal tenderness guarding rebound and I don't see a hernia laying or standing Negative SLR lower extremities pulses are intact with normal capillary refill no focal tenderness of her upper thigh but points to medial tendon attachment area.  No acute skin changes. No muscle spasm or fasciculations. Left hand within normal limits with a tiny palpable feeling nodule at the base of the left thumb thenar pad. Good range of motion. Fingers and thumb C.  last laboratory studies a month ago potassium was normal.    Borderline tsh.     Assessment & Plan:  Thigh pain  Episodic sounding like muscle cramp unclear etiology unusual location. This doesn't seem like an arterial problem rule out metabolic triggers. Consider evaluation for hip problem is persistent central progressive.  Palmar nodule  unrelated to persistent and progressive we'll see hand surgeon  DM working on better control  Obesity working on weight loss using a supplement herbal life

## 2011-03-25 NOTE — Patient Instructions (Signed)
This acts like a muscle cramp but unsure why you have this. Your artery pulses are good today.  Check labs today and let you know results incase  From low potassium or magnesium.  If unrevealing we may get  Hip x rays and let   Ortho evaluate .

## 2011-03-26 LAB — CALCIUM, IONIZED: Calcium, Ion: 1.3 mmol/L (ref 1.12–1.32)

## 2011-03-28 ENCOUNTER — Other Ambulatory Visit: Payer: Self-pay | Admitting: Internal Medicine

## 2011-03-28 MED ORDER — LEVOTHYROXINE SODIUM 25 MCG PO TABS: 25.0000 ug | ORAL_TABLET | Freq: Every day | ORAL | Status: DC

## 2011-03-28 NOTE — Progress Notes (Signed)
Quick Note:  Pt aware of results and rx called in. ______

## 2011-05-02 ENCOUNTER — Other Ambulatory Visit: Payer: Self-pay | Admitting: Internal Medicine

## 2011-05-22 ENCOUNTER — Ambulatory Visit: Payer: Medicare Other | Admitting: Internal Medicine

## 2011-05-23 ENCOUNTER — Ambulatory Visit: Payer: Medicare Other | Admitting: Pulmonary Disease

## 2011-05-26 ENCOUNTER — Encounter: Payer: Self-pay | Admitting: Internal Medicine

## 2011-05-26 ENCOUNTER — Ambulatory Visit (INDEPENDENT_AMBULATORY_CARE_PROVIDER_SITE_OTHER): Payer: Medicare Other | Admitting: Internal Medicine

## 2011-05-26 VITALS — BP 126/70 | HR 86 | Temp 98.9°F | Wt 300.0 lb

## 2011-05-26 DIAGNOSIS — E119 Type 2 diabetes mellitus without complications: Secondary | ICD-10-CM

## 2011-05-26 DIAGNOSIS — I10 Essential (primary) hypertension: Secondary | ICD-10-CM | POA: Diagnosis not present

## 2011-05-26 DIAGNOSIS — R7989 Other specified abnormal findings of blood chemistry: Secondary | ICD-10-CM

## 2011-05-26 DIAGNOSIS — E782 Mixed hyperlipidemia: Secondary | ICD-10-CM

## 2011-05-26 DIAGNOSIS — IMO0001 Reserved for inherently not codable concepts without codable children: Secondary | ICD-10-CM | POA: Diagnosis not present

## 2011-05-26 DIAGNOSIS — E790 Hyperuricemia without signs of inflammatory arthritis and tophaceous disease: Secondary | ICD-10-CM

## 2011-05-26 DIAGNOSIS — R946 Abnormal results of thyroid function studies: Secondary | ICD-10-CM | POA: Diagnosis not present

## 2011-05-26 DIAGNOSIS — E039 Hypothyroidism, unspecified: Secondary | ICD-10-CM

## 2011-05-26 MED ORDER — ATORVASTATIN CALCIUM 10 MG PO TABS: 10.0000 mg | ORAL_TABLET | Freq: Every day | ORAL | Status: DC

## 2011-05-26 NOTE — Patient Instructions (Addendum)
Get a mammogram  Will notify you  of labs when available.  Go back on  lipitor 10 mg  And then will follow up on test.  In a few months.   ROV in 4 months or so .

## 2011-05-26 NOTE — Progress Notes (Signed)
  Subjective:    Patient ID: Rebecca Rich, female    DOB: 1945/05/09, 66 y.o.   MRN: [id number]  HPI Patient comes in today for follow up of  multiple medical problems.  To see Dr Shelle Iron soon for the OSA.  Dental and eye check in the works   ? Se of lipitor.   Had urine problem   Liver test abnormal With gallstone.  HT controlled  THyroid just on low dose thinks its enough  Outpatient Encounter Prescriptions as of 05/26/2011  Medication Sig Dispense Refill  . calcium-vitamin D 185 MG TABS Take 185 mg by mouth daily.        . hydrochlorothiazide (HYDRODIURIL) 25 MG tablet TAKE ONE TABLET (25MG  TOTAL) DAILY  90 tablet  1  . levothyroxine (LEVOTHROID) 25 MCG tablet Take 1 tablet (25 mcg total) by mouth daily.  30 tablet  3  . Multiple Vitamins-Minerals (CENTRUM SILVER PO) Take 1 tablet by mouth daily.        Marland Kitchen OVER THE COUNTER MEDICATION Herbal life      . ramipril (ALTACE) 10 MG capsule TAKE 1 CAPSULE DAILY (NEED TO SCHEDULE A FOLLOW UP APPOINTMENT BEFORE NEXT REFILL)  180 capsule  3    Fasting blood sugars   120 range   No meds   Review of Systems NO cp sob swelling  Cramps havent returned  No numbness vision change bleeding numb below chole scar but no real pain.  Palmar nodule went away  Past history family history social history reviewed in the electronic medical record.     Objective:   Physical Exam BP 126/70  Pulse 86  Temp(Src) 98.9 F (37.2 C) (Oral)  Wt 300 lb (136.079 kg)  SpO2 98% WDWN in nad Hands no nodule noted  Chest:  Clear to A&P without wheezes rales or rhonchi CV:  S1-S2 no gallops or murmurs peripheral perfusion is normal No clubbing cyanosis or edema Abdomen:  Sof,t normal bowel sounds without hepatosplenomegaly, no guarding rebound or masses no CVA tenderness.  Skin: normal capillary refill ,turgor , color: No acute rashes ,petechiae or bruising mult keloids       Assessment & Plan:  DM needs recheck wants to avoid meds   No sx  No known  comlications Declined pneumovax today  Is utd on colon every 5 years?  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 Palm nodule better Elevated uric acid without gout  Pt prefers to follow and no meds  Get a mammogram. Obesity lose weight like she did in the past.

## 2011-05-29 NOTE — Progress Notes (Signed)
Quick Note:  Left a message for pt to return call. ______ 

## 2011-05-30 ENCOUNTER — Other Ambulatory Visit: Payer: Self-pay | Admitting: Internal Medicine

## 2011-05-30 DIAGNOSIS — R946 Abnormal results of thyroid function studies: Secondary | ICD-10-CM

## 2011-05-30 DIAGNOSIS — E782 Mixed hyperlipidemia: Secondary | ICD-10-CM

## 2011-05-30 DIAGNOSIS — E119 Type 2 diabetes mellitus without complications: Secondary | ICD-10-CM

## 2011-05-30 NOTE — Progress Notes (Signed)
Quick Note:  Pt aware of lab results. Pt request a copy be mailed to address. Pt's address verified. ______

## 2011-05-31 DIAGNOSIS — E790 Hyperuricemia without signs of inflammatory arthritis and tophaceous disease: Secondary | ICD-10-CM | POA: Insufficient documentation

## 2011-05-31 DIAGNOSIS — E039 Hypothyroidism, unspecified: Secondary | ICD-10-CM | POA: Insufficient documentation

## 2011-06-05 ENCOUNTER — Ambulatory Visit (INDEPENDENT_AMBULATORY_CARE_PROVIDER_SITE_OTHER): Payer: Medicare Other | Admitting: Pulmonary Disease

## 2011-06-05 ENCOUNTER — Encounter: Payer: Self-pay | Admitting: Pulmonary Disease

## 2011-06-05 VITALS — BP 130/72 | HR 81 | Temp 98.4°F | Ht 61.0 in | Wt 292.6 lb

## 2011-06-05 DIAGNOSIS — G4733 Obstructive sleep apnea (adult) (pediatric): Secondary | ICD-10-CM | POA: Diagnosis not present

## 2011-06-05 NOTE — Assessment & Plan Note (Signed)
The patient seems to be doing well on CPAP, but does need to get new supplies and a mask.  I will get her referred to a new DME company.  I have also encouraged her to work aggressively on weight loss.  If doing well, we'll see her back in one year.

## 2011-06-05 NOTE — Patient Instructions (Signed)
Stay on cpap, and we will refer you to a new dme for supplies and mask Work on weight loss followup with me in one year if doing well.

## 2011-06-05 NOTE — Progress Notes (Signed)
  Subjective:    Patient ID: Rebecca Rich, female    DOB: 1945/04/11, 66 y.o.   MRN: [id number]  HPI The patient comes in today for followup of her known obstructive sleep apnea.  She is wearing CPAP compliantly, but she is overdue for supplies and a mask change.  Her prior DME has apparently gone out of business.  She feels that she is sleeping well with the device, and denies any issues with mask fit or pressure.  She is satisfied with her daytime alertness.  Of note, her weight is down 5 pounds from the last visit.   Review of Systems  Constitutional: Negative.  Negative for fever and unexpected weight change.  HENT: Negative.  Negative for ear pain, nosebleeds, congestion, sore throat, rhinorrhea, sneezing, trouble swallowing, dental problem, postnasal drip and sinus pressure.   Eyes: Negative.  Negative for redness and itching.  Respiratory: Negative.  Negative for cough, chest tightness, shortness of breath and wheezing.   Cardiovascular: Negative.  Negative for palpitations and leg swelling.  Gastrointestinal: Negative.  Negative for nausea and vomiting.  Genitourinary: Negative.  Negative for dysuria.  Musculoskeletal: Negative.  Negative for joint swelling.  Skin: Negative.  Negative for rash.  Neurological: Negative.  Negative for headaches.  Hematological: Negative.  Does not bruise/bleed easily.  Psychiatric/Behavioral: Negative.  Negative for dysphoric mood. The patient is not nervous/anxious.        Objective:   Physical Exam Morbidly obese female in no acute distress No skin breakdown or pressure necrosis from the CPAP mask Lower extremities with mild edema, cyanosis Lower, oriented, moves all 4 extremities.  Does not appear to be overly sleepy.       Assessment & Plan:

## 2011-07-08 ENCOUNTER — Other Ambulatory Visit: Payer: Self-pay | Admitting: Family Medicine

## 2011-07-08 MED ORDER — RAMIPRIL 10 MG PO CAPS: 10.0000 mg | ORAL_CAPSULE | Freq: Every day | ORAL | Status: DC

## 2011-07-14 ENCOUNTER — Other Ambulatory Visit: Payer: Self-pay | Admitting: Family Medicine

## 2011-07-14 MED ORDER — RAMIPRIL 10 MG PO CAPS: 10.0000 mg | ORAL_CAPSULE | Freq: Every day | ORAL | Status: DC

## 2011-07-23 ENCOUNTER — Other Ambulatory Visit: Payer: Self-pay | Admitting: Internal Medicine

## 2011-07-24 ENCOUNTER — Other Ambulatory Visit: Payer: Self-pay | Admitting: Family Medicine

## 2011-08-08 ENCOUNTER — Other Ambulatory Visit: Payer: Self-pay | Admitting: Internal Medicine

## 2011-08-29 ENCOUNTER — Other Ambulatory Visit: Payer: Self-pay | Admitting: Internal Medicine

## 2011-09-25 ENCOUNTER — Ambulatory Visit (INDEPENDENT_AMBULATORY_CARE_PROVIDER_SITE_OTHER): Payer: Medicare Other | Admitting: Internal Medicine

## 2011-09-25 ENCOUNTER — Encounter: Payer: Self-pay | Admitting: Internal Medicine

## 2011-09-25 VITALS — BP 140/70 | HR 84 | Temp 99.0°F | Wt 280.0 lb

## 2011-09-25 DIAGNOSIS — I10 Essential (primary) hypertension: Secondary | ICD-10-CM

## 2011-09-25 DIAGNOSIS — L910 Hypertrophic scar: Secondary | ICD-10-CM

## 2011-09-25 DIAGNOSIS — E119 Type 2 diabetes mellitus without complications: Secondary | ICD-10-CM | POA: Diagnosis not present

## 2011-09-25 DIAGNOSIS — R946 Abnormal results of thyroid function studies: Secondary | ICD-10-CM | POA: Diagnosis not present

## 2011-09-25 DIAGNOSIS — E782 Mixed hyperlipidemia: Secondary | ICD-10-CM

## 2011-09-25 LAB — LIPID PANEL
LDL Cholesterol: 47 mg/dL (ref 0–99)
Total CHOL/HDL Ratio: 2
VLDL: 20.4 mg/dL (ref 0.0–40.0)

## 2011-09-25 LAB — HEPATIC FUNCTION PANEL
Bilirubin, Direct: 0.1 mg/dL (ref 0.0–0.3)
Total Bilirubin: 0.6 mg/dL (ref 0.3–1.2)
Total Protein: 7.3 g/dL (ref 6.0–8.3)

## 2011-09-25 LAB — TSH: TSH: 3.4 u[IU]/mL (ref 0.35–5.50)

## 2011-09-25 NOTE — Patient Instructions (Signed)
Continue lifestyle intervention healthy eating and exercise . And weight loss..  You may still get benefit  With the metformin  But can wait until next check also  .  Will notify you  of labs when available.  Continue other meds.  For now  Labs and OV in December.

## 2011-09-25 NOTE — Progress Notes (Signed)
  Subjective:    Patient ID: Rebecca Rich, female    DOB: August 15, 1945, 66 y.o.   MRN: [id number]  HPI  Patient comes in today for follow up of  multiple medical problems.  FEELS well  Thyroid  Taking med at night  LIPID taking  About 80 % time  . lipitor  Joined  a fit camp.   Can now get out of chair   Without help exercising in early am  BG below 130 and  Usually good after  Eating also . Keloid on abd  Hurts  at times  Review of Systems No fever syncop cp sob foot swelling numbness or bleeding   Past history family history social history reviewed in the electronic medical record. Outpatient Encounter Prescriptions as of 09/25/2011  Medication Sig Dispense Refill  . atorvastatin (LIPITOR) 10 MG tablet Take 1 tablet (10 mg total) by mouth daily.  90 tablet  2  . calcium-vitamin D 185 MG TABS Take 185 mg by mouth daily.        . hydrochlorothiazide (HYDRODIURIL) 25 MG tablet TAKE 1 TABLET BY MOUTH DAILY  90 tablet  0  . levothyroxine (SYNTHROID, LEVOTHROID) 25 MCG tablet TAKE 1 TABLET BY MOUTH DAILY  30 tablet  3  . Multiple Vitamins-Minerals (CENTRUM SILVER PO) Take 1 tablet by mouth daily.        Marland Kitchen OVER THE COUNTER MEDICATION Herbal life      . ramipril (ALTACE) 10 MG capsule Take 1 capsule (10 mg total) by mouth daily.  90 capsule  0  .            Objective:   Physical Exam BP 140/70  Pulse 84  Temp 99 F (37.2 C) (Oral)  Wt 280 lb (127.007 kg)  SpO2 98% Wt Readings from Last 3 Encounters:  09/25/11 280 lb (127.007 kg)  06/05/11 292 lb 9.6 oz (132.722 kg)  05/26/11 300 lb (136.079 kg)  wdwn in nad looks well Chest:  Clear to A&P without wheezes rales or rhonchi CV:  S1-S2 no gallops  peripheral perfusion is normal  Sem lusb  Skin non icteric  Scar keloid prominent  No redness or abscess or infection noted     Assessment & Plan:  DM ? If better  Prefers no med didn't get metformin LIPIDS THYROID check  OBESITY  Losing to continue HT unfortunately didn't get the  metformin but may be doing ok anyway and wants to wait another round to see if BG better.  By December  Where her goal weigh  isl  250    Get the labs today  And let her know if other intervention needed before next visit and monitoign in December   Scar tenderness   Expectant management.  Total visit > 50% spent counseling and coordinating care

## 2011-10-02 ENCOUNTER — Other Ambulatory Visit: Payer: Self-pay | Admitting: Family Medicine

## 2011-10-02 DIAGNOSIS — E119 Type 2 diabetes mellitus without complications: Secondary | ICD-10-CM

## 2011-10-02 DIAGNOSIS — E039 Hypothyroidism, unspecified: Secondary | ICD-10-CM

## 2011-11-03 ENCOUNTER — Other Ambulatory Visit: Payer: Self-pay | Admitting: Internal Medicine

## 2011-11-24 ENCOUNTER — Other Ambulatory Visit: Payer: Self-pay | Admitting: Internal Medicine

## 2011-12-30 ENCOUNTER — Other Ambulatory Visit: Payer: Medicare Other

## 2012-01-06 ENCOUNTER — Other Ambulatory Visit: Payer: Self-pay | Admitting: Family Medicine

## 2012-01-06 ENCOUNTER — Other Ambulatory Visit (INDEPENDENT_AMBULATORY_CARE_PROVIDER_SITE_OTHER): Payer: Medicare Other

## 2012-01-06 DIAGNOSIS — I10 Essential (primary) hypertension: Secondary | ICD-10-CM | POA: Diagnosis not present

## 2012-01-06 DIAGNOSIS — E119 Type 2 diabetes mellitus without complications: Secondary | ICD-10-CM

## 2012-01-06 DIAGNOSIS — E039 Hypothyroidism, unspecified: Secondary | ICD-10-CM

## 2012-01-06 LAB — BASIC METABOLIC PANEL
CO2: 25 mEq/L (ref 19–32)
Calcium: 9.1 mg/dL (ref 8.4–10.5)
Chloride: 104 mEq/L (ref 96–112)
Sodium: 137 mEq/L (ref 135–145)

## 2012-01-06 MED ORDER — HYDROCHLOROTHIAZIDE 25 MG PO TABS: 25.0000 mg | ORAL_TABLET | Freq: Every day | ORAL | Status: DC

## 2012-01-08 ENCOUNTER — Ambulatory Visit: Payer: Medicare Other | Admitting: Internal Medicine

## 2012-01-20 ENCOUNTER — Encounter: Payer: Self-pay | Admitting: Internal Medicine

## 2012-01-20 ENCOUNTER — Ambulatory Visit (INDEPENDENT_AMBULATORY_CARE_PROVIDER_SITE_OTHER): Payer: Medicare Other | Admitting: Internal Medicine

## 2012-01-20 VITALS — BP 130/70 | HR 84 | Temp 98.6°F | Wt 284.7 lb

## 2012-01-20 DIAGNOSIS — M1990 Unspecified osteoarthritis, unspecified site: Secondary | ICD-10-CM

## 2012-01-20 DIAGNOSIS — M25519 Pain in unspecified shoulder: Secondary | ICD-10-CM | POA: Diagnosis not present

## 2012-01-20 DIAGNOSIS — E039 Hypothyroidism, unspecified: Secondary | ICD-10-CM

## 2012-01-20 DIAGNOSIS — M25512 Pain in left shoulder: Secondary | ICD-10-CM

## 2012-01-20 DIAGNOSIS — E119 Type 2 diabetes mellitus without complications: Secondary | ICD-10-CM | POA: Diagnosis not present

## 2012-01-20 DIAGNOSIS — E669 Obesity, unspecified: Secondary | ICD-10-CM

## 2012-01-20 DIAGNOSIS — I10 Essential (primary) hypertension: Secondary | ICD-10-CM

## 2012-01-20 NOTE — Patient Instructions (Signed)
Continue lifestyle intervention healthy eating and exercise . Weight loss. ( went up 4 # since last visit    ) watch the sugars and calories .    Will do referral for your left shoulder issue.  Specific therapy may help again .   Repeat labs a1c in 4 months  And then rov.

## 2012-01-20 NOTE — Progress Notes (Signed)
Chief Complaint  Patient presents with  . Diabetes    HPI: Patient comes in today for follow up of  multiple medical problems.  Mostly dm feels good legs feel better   More energetic however left shoulder causing discomfort for the past 2 months without injury. Some pain at night some with elevation and external rotation. She has a history of right shoulder problem that resolve with physical therapy. She is left-handed and does more with her left arm. Denies any vision change says she has a cold is getting better it has a minor cough no chest pain or shortness of breath she is to travel for the Christmas holiday. Has had some dietary indiscretion such as a sugar binge about a month ago but otherwise is doing better down 2 dress sizes. ROS: See pertinent positives and negatives per HPI.  Past Medical History  Diagnosis Date  . DIABETES MELLITUS, TYPE II 12/08/2006  . HYPERLIPIDEMIA 12/08/2006  . OBESITY 09/24/2009  . Morbid obesity 12/08/2006  . OBSTRUCTIVE SLEEP APNEA 12/08/2006  . HYPERTENSION 12/08/2006  . Acute bronchospasm 08/24/2009  . INFECTION, SKIN AND SOFT TISSUE 07/28/2008  . 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 OSTEOARTHRITIS 12/08/2006  . SHOULDER PAIN, RIGHT 02/07/2008  . Swelling of limb 07/28/2008  . CARDIAC MURMUR 12/08/2006  . RUQ PAIN 06/16/2008  . THYROID FUNCTION TEST, ABNORMAL 12/08/2006  . LIVER FUNCTION TESTS, ABNORMAL 07/28/2008  . COLONIC POLYPS, HX OF 12/08/2006  . 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 Gallbladder/common duct stone, without infection, with obstruction 03/01/2010    removed ercp    Family History  Problem Relation Age of Onset  . Other Mother     blood clots  . Heart disease Brother   . Heart disease Sister   . Heart disease Brother   . Heart disease Brother   . Stroke Sister   . Throat cancer Father   . Cervical cancer Mother   . Diabetes Other     all siblings, 4 brothers, 5 sisters    History   Social History  . Marital Status: Single    Spouse Name:  N/A    Number of Children: N/A  . Years of Education: N/A   Occupational History  . retired    Social History Main Topics  . Smoking status: Never Smoker   . Smokeless tobacco: None  . Alcohol Use: No  . Drug Use: No  . Sexually Active: None   Other Topics Concern  . 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 atorvastatin (LIPITOR) 10 MG tablet Take 1 tablet (10 mg total) by mouth daily.  90 tablet  2  . calcium-vitamin D 185 MG TABS Take 185 mg by mouth daily.        . hydrochlorothiazide (HYDRODIURIL) 25 MG tablet Take 1 tablet (25 mg total) by mouth daily.  90 tablet  0  . levothyroxine (SYNTHROID, LEVOTHROID) 25 MCG tablet TAKE 1 TABLET BY MOUTH DAILY  30 tablet  2  . metFORMIN (GLUCOPHAGE-XR) 500 MG 24 hr tablet Take 500 mg by mouth daily with breakfast.      . Multiple Vitamins-Minerals (CENTRUM SILVER PO) Take 1 tablet by mouth daily.        Marland Kitchen OVER THE COUNTER MEDICATION Herbal life      .  ramipril (ALTACE) 10 MG capsule Take 1 capsule (10 mg total) by mouth daily.  90 capsule  0    EXAM:  BP 130/70  Pulse 84  Temp 98.6 F (37 C) (Oral)  Wt 284 lb 11.2 oz (129.139 kg)  There is no height on file to calculate BMI.  GENERAL: vitals reviewed and listed above, alert, oriented, appears well hydrated and in no acute distress  HEENT: atraumatic, conjunctiva  clear, no obvious abnormalities on inspection of external nose and ears OP : no lesion edema or exudate   NECK: no obvious masses on inspection palpation   LUNGS: clear to auscultation bilaterally, no wheezes, rales or rhonchi, good air movement  CV: HRRR, no clubbing cyanosis or  peripheral edema nl cap refill   MS: moves all extremities without noticeable focal  Abnormality  left shoulder some tenderness anterior can elevate above head some pain with cross body movement.  PSYCH: pleasant and cooperative, no obvious depression or anxiety Lab Results  Component Value Date   WBC 5.9 02/20/2011   HGB 13.8 02/20/2011   HCT 40.9 02/20/2011   PLT 161.0 02/20/2011   GLUCOSE 103* 01/06/2012   CHOL 119 09/25/2011   TRIG 102.0 09/25/2011   HDL 52.00 09/25/2011   LDLCALC 47 09/25/2011   ALT 18 09/25/2011   AST 18 09/25/2011   NA 137 01/06/2012   K 3.7 01/06/2012   CL 104 01/06/2012   CREATININE 0.9 01/06/2012   BUN 17 01/06/2012   CO2 25 01/06/2012   TSH 3.36 01/06/2012   HGBA1C 6.7* 01/06/2012   MICROALBUR 0.4 08/02/2009   Wt Readings from Last 3 Encounters:  01/20/12 284 lb 11.2 oz (129.139 kg)  09/25/11 280 lb (127.007 kg)  06/05/11 292 lb 9.6 oz (132.722 kg)    ASSESSMENT AND PLAN:  Discussed the following assessment and plan:  1. DIABETES MELLITUS, TYPE II     Acceptable without medication and monitor recheck in 4 months  2. Left shoulder pain  Ambulatory referral to Orthopedic Surgery  3. HYPERTENSION     Stable  4. OSTEOARTHRITIS     Needs improved continue weight loss  5. Hypothyroid     TSH in a reasonable range today continue  6. Obesity     Continue weight loss  up 4 pounds since last time reviewed with patient.    -Patient advised to return or notify health care team  immediately if symptoms worsen or persist or new concerns arise.  Patient Instructions  Continue lifestyle intervention healthy eating and exercise . Weight loss. ( went up 4 # since last visit    ) watch the sugars and calories .    Will do referral for your left shoulder issue.  Specific therapy may help again .   Repeat labs a1c in 4 months  And then rov.     Neta Mends. Zedrick Springsteen M.D.  Total visit > 50% spent counseling and coordinating care

## 2012-01-29 DIAGNOSIS — M25519 Pain in unspecified shoulder: Secondary | ICD-10-CM | POA: Diagnosis not present

## 2012-02-03 DIAGNOSIS — M25519 Pain in unspecified shoulder: Secondary | ICD-10-CM | POA: Diagnosis not present

## 2012-02-05 DIAGNOSIS — M25519 Pain in unspecified shoulder: Secondary | ICD-10-CM | POA: Diagnosis not present

## 2012-02-10 DIAGNOSIS — M25519 Pain in unspecified shoulder: Secondary | ICD-10-CM | POA: Diagnosis not present

## 2012-02-12 DIAGNOSIS — M25519 Pain in unspecified shoulder: Secondary | ICD-10-CM | POA: Diagnosis not present

## 2012-02-17 DIAGNOSIS — M25519 Pain in unspecified shoulder: Secondary | ICD-10-CM | POA: Diagnosis not present

## 2012-02-19 DIAGNOSIS — M25519 Pain in unspecified shoulder: Secondary | ICD-10-CM | POA: Diagnosis not present

## 2012-02-23 ENCOUNTER — Emergency Department (HOSPITAL_COMMUNITY)
Admission: EM | Admit: 2012-02-23 | Discharge: 2012-02-23 | Disposition: A | Discharge status: Home / Self Care | Payer: Medicare Other | Attending: Emergency Medicine | Admitting: Emergency Medicine

## 2012-02-23 ENCOUNTER — Encounter (HOSPITAL_COMMUNITY): Payer: Self-pay | Admitting: *Deleted

## 2012-02-23 ENCOUNTER — Telehealth: Payer: Self-pay | Admitting: Internal Medicine

## 2012-02-23 DIAGNOSIS — Z9089 Acquired absence of other organs: Secondary | ICD-10-CM | POA: Insufficient documentation

## 2012-02-23 DIAGNOSIS — E119 Type 2 diabetes mellitus without complications: Secondary | ICD-10-CM | POA: Diagnosis not present

## 2012-02-23 DIAGNOSIS — Z8679 Personal history of other diseases of the circulatory system: Secondary | ICD-10-CM | POA: Diagnosis not present

## 2012-02-23 DIAGNOSIS — M7989 Other specified soft tissue disorders: Secondary | ICD-10-CM | POA: Diagnosis not present

## 2012-02-23 DIAGNOSIS — R011 Cardiac murmur, unspecified: Secondary | ICD-10-CM | POA: Insufficient documentation

## 2012-02-23 DIAGNOSIS — IMO0001 Reserved for inherently not codable concepts without codable children: Secondary | ICD-10-CM | POA: Insufficient documentation

## 2012-02-23 DIAGNOSIS — I10 Essential (primary) hypertension: Secondary | ICD-10-CM | POA: Insufficient documentation

## 2012-02-23 DIAGNOSIS — Z872 Personal history of diseases of the skin and subcutaneous tissue: Secondary | ICD-10-CM | POA: Diagnosis not present

## 2012-02-23 DIAGNOSIS — Z860100 Personal history of colon polyps, unspecified: Secondary | ICD-10-CM | POA: Insufficient documentation

## 2012-02-23 DIAGNOSIS — Z299 Encounter for prophylactic measures, unspecified: Secondary | ICD-10-CM

## 2012-02-23 DIAGNOSIS — I82409 Acute embolism and thrombosis of unspecified deep veins of unspecified lower extremity: Secondary | ICD-10-CM | POA: Diagnosis not present

## 2012-02-23 DIAGNOSIS — E785 Hyperlipidemia, unspecified: Secondary | ICD-10-CM | POA: Diagnosis not present

## 2012-02-23 DIAGNOSIS — Z8669 Personal history of other diseases of the nervous system and sense organs: Secondary | ICD-10-CM | POA: Insufficient documentation

## 2012-02-23 DIAGNOSIS — Z8709 Personal history of other diseases of the respiratory system: Secondary | ICD-10-CM | POA: Diagnosis not present

## 2012-02-23 DIAGNOSIS — Z90710 Acquired absence of both cervix and uterus: Secondary | ICD-10-CM | POA: Insufficient documentation

## 2012-02-23 DIAGNOSIS — M79609 Pain in unspecified limb: Secondary | ICD-10-CM | POA: Diagnosis not present

## 2012-02-23 DIAGNOSIS — Z8739 Personal history of other diseases of the musculoskeletal system and connective tissue: Secondary | ICD-10-CM | POA: Insufficient documentation

## 2012-02-23 DIAGNOSIS — M79606 Pain in leg, unspecified: Secondary | ICD-10-CM

## 2012-02-23 DIAGNOSIS — Z79899 Other long term (current) drug therapy: Secondary | ICD-10-CM | POA: Insufficient documentation

## 2012-02-23 LAB — CBC
Hemoglobin: 12.8 g/dL (ref 12.0–15.0)
MCH: 30.1 pg (ref 26.0–34.0)
MCV: 89.4 fL (ref 78.0–100.0)
Platelets: 140 10*3/uL — ABNORMAL LOW (ref 150–400)
RBC: 4.25 MIL/uL (ref 3.87–5.11)
WBC: 5.4 10*3/uL (ref 4.0–10.5)

## 2012-02-23 LAB — BASIC METABOLIC PANEL
CO2: 27 mEq/L (ref 19–32)
Chloride: 100 mEq/L (ref 96–112)
Creatinine, Ser: 0.99 mg/dL (ref 0.50–1.10)
Glucose, Bld: 112 mg/dL — ABNORMAL HIGH (ref 70–99)
Sodium: 138 mEq/L (ref 135–145)

## 2012-02-23 MED ORDER — ENOXAPARIN SODIUM 150 MG/ML ~~LOC~~ SOLN
130.0000 mg | Freq: Once | SUBCUTANEOUS | Status: AC
  Administered 2012-02-23: 130 mg via SUBCUTANEOUS
  Filled 2012-02-23: qty 1

## 2012-02-23 NOTE — ED Provider Notes (Signed)
Medical screening examination/treatment/procedure(s) were conducted as a shared visit with non-physician practitioner(s) and myself.  I personally evaluated the patient during the encounter  See my additional note. Suspect diabetic neuropathy  Glynn Octave, MD 02/23/12 2314

## 2012-02-23 NOTE — ED Notes (Signed)
Pt reports having bilateral leg cramps this morning.  Also states that her legs are swollen.  Pt noted to have edema in bilateral legs.  No weeping noted.  Pt deneis SOB.

## 2012-02-23 NOTE — ED Notes (Signed)
Pt dc to home.   Pt states understanding to dc instructions.  Pt ambulatory to exit without difficulty.  Pt denies need for w/c. 

## 2012-02-23 NOTE — Telephone Encounter (Signed)
Patient Information:  Caller Name: [place]  Phone: [phone]  Patient: Rebecca Rich, Rebecca Rich  Gender: Female  DOB: 06/13/1945  Age: 67 Years  PCP: Berniece Andreas [facility])  Office Follow Up:  Does the office need to follow up with this patient?: No  Instructions For The Office: N/A  RN Note:  Patient triaged in [facility] under Hip non-injury protocol. Pt to see provider within 4hours. It is after 4pm, pt instructed to use Urgent Care or ED.   Symptoms  Reason For Call & Symptoms: Pt states she had severe cramps in her hips that radiated to her  thighs last night that woke her up. It happened again this morning. She states she has swelling bilateral legs but more in the left leg. She has pain in the right calf that she rates at a 3 on pain scale.  She has redness in both ankles and can see a knot on the top of her right foot.  Reviewed Health History In EMR: Yes  Reviewed Medications In EMR: Yes  Reviewed Allergies In EMR: Yes  Reviewed Surgeries / Procedures: Yes  Date of Onset of Symptoms: 02/22/2012  Guideline(s) Used:  No Protocol Available - Sick Adult  Disposition Per Guideline:   Go to ED Now (or to Office with PCP Approval)  Reason For Disposition Reached:   Nursing judgment  Advice Given:  Call Back If:  You become worse.

## 2012-02-23 NOTE — ED Provider Notes (Signed)
History     CSN: [id number]  Arrival date & time 02/23/12  1946   First MD Initiated Contact with Patient 02/23/12 1955      Chief Complaint  Patient presents with  . Leg Pain    (Consider location/radiation/quality/duration/timing/severity/associated sxs/prior treatment) HPI Comments: 67 y/o morbidly obese female with a history of HTN, hyperlipidemia and DM presents to the ED complaining of bilateral leg pain beginning around 6:00 this morning. Describes the pain as cramping shooting through her thighs ending at her knees. She then fell asleep and woke up a few hours later with the pain returning. "Rocking" her legs and sitting up relieved the pain. A little later she began experiencing pain in her calves. Thigh pain completely subsided. Palpating her legs and walking make the pain worse. Denies chest pain, shortness of breath, fever, chills, numbness or tingling. No history of blood clots.  The history is provided by the patient.    Past Medical History  Diagnosis Date  . DIABETES MELLITUS, TYPE II 12/08/2006  . HYPERLIPIDEMIA 12/08/2006  . OBESITY 09/24/2009  . Morbid obesity 12/08/2006  . OBSTRUCTIVE SLEEP APNEA 12/08/2006  . HYPERTENSION 12/08/2006  . Acute bronchospasm 08/24/2009  . INFECTION, SKIN AND SOFT TISSUE 07/28/2008  . 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 OSTEOARTHRITIS 12/08/2006  . SHOULDER PAIN, RIGHT 02/07/2008  . Swelling of limb 07/28/2008  . CARDIAC MURMUR 12/08/2006  . RUQ PAIN 06/16/2008  . THYROID FUNCTION TEST, ABNORMAL 12/08/2006  . LIVER FUNCTION TESTS, ABNORMAL 07/28/2008  . COLONIC POLYPS, HX OF 12/08/2006  . 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 Gallbladder/common duct stone, without infection, with obstruction 03/01/2010    removed ercp    Past Surgical History  Procedure Date  . Abdominal hysterectomy   . Cholecystectomy 06/27/2008  . Common duct stone     ERCP     Family History  Problem Relation Age of Onset  . Other Mother     blood clots  . Heart disease  Brother   . Heart disease Sister   . Heart disease Brother   . Heart disease Brother   . Stroke Sister   . Throat cancer Father   . Cervical cancer Mother   . Diabetes Other     all siblings, 4 brothers, 5 sisters    History  Substance Use Topics  . Smoking status: Never Smoker   . Smokeless tobacco: Not on file  . Alcohol Use: No    OB History    Grav Para Term Preterm Abortions TAB SAB Ect Mult Living                  Review of Systems  Constitutional: Negative for fever and chills.  Respiratory: Negative for cough, shortness of breath and wheezing.   Cardiovascular: Positive for leg swelling. Negative for chest pain.  Gastrointestinal: Negative for nausea and vomiting.  Musculoskeletal: Positive for myalgias. Negative for back pain.  All other systems reviewed and are negative.    Allergies  Review of patient's allergies indicates no known allergies.  Home Medications   Current Outpatient Rx  Name  Route  Sig  Dispense  Refill  . ATORVASTATIN CALCIUM 10 MG PO TABS   Oral   Take 1 tablet (10 mg total) by mouth daily.   90 tablet   2   . CALCIUM-VITAMIN D 185 MG PO TABS   Oral   Take 185 mg by mouth daily.           Marland Kitchen HYDROCHLOROTHIAZIDE 25 MG  PO TABS   Oral   Take 1 tablet (25 mg total) by mouth daily.   90 tablet   0   . LEVOTHYROXINE SODIUM 25 MCG PO TABS      TAKE 1 TABLET BY MOUTH DAILY   30 tablet   2   . METFORMIN HCL ER 500 MG PO TB24   Oral   Take 500 mg by mouth daily with breakfast.         . CENTRUM SILVER PO   Oral   Take 1 tablet by mouth daily.           Marland Kitchen OVER THE COUNTER MEDICATION      Herbal life         . RAMIPRIL 10 MG PO CAPS   Oral   Take 1 capsule (10 mg total) by mouth daily.   90 capsule   0     BP 181/46  Temp 98.4 F (36.9 C) (Oral)  Resp 18  SpO2 98%  Physical Exam  Nursing note and vitals reviewed. Constitutional: She is oriented to person, place, and time. She appears well-developed. No  distress.       Morbidly obese.  HENT:  Head: Normocephalic and atraumatic.  Mouth/Throat: Oropharynx is clear and moist.  Eyes: Conjunctivae normal and EOM are normal. Pupils are equal, round, and reactive to light.  Neck: Normal range of motion. Neck supple. No JVD present.  Cardiovascular: Normal rate, regular rhythm and normal heart sounds.   Pulses:      Dorsalis pedis pulses are 2+ on the right side, and 2+ on the left side.       Non-pitting edema lower extremities bilateral with mild stasis changes.  Pulmonary/Chest: Effort normal and breath sounds normal. No respiratory distress. She has no wheezes. She has no rales.  Musculoskeletal: Normal range of motion.       Right lower leg: She exhibits tenderness and edema.       Left lower leg: She exhibits tenderness and edema.       Legs: Neurological: She is alert and oriented to person, place, and time. No sensory deficit.  Skin: Skin is warm and dry. She is not diaphoretic.  Psychiatric: She has a normal mood and affect. Her behavior is normal.    ED Course  Procedures (including critical care time)  Labs Reviewed  CBC - Abnormal; Notable for the following:    Platelets 140 (*)     All other components within normal limits  BASIC METABOLIC PANEL - Abnormal; Notable for the following:    Glucose, Bld 112 (*)     GFR calc non Af Amer 57 (*)     GFR calc Af Amer 66 (*)     All other components within normal limits  CK   No results found.   1. Leg pain   2. DVT prophylaxis       MDM  67 y/o female with bilateral leg pain and swelling. She was advised by PCP to come to ED to r/o blood clots. Heart/lung exam unremarkable. Tenderness in bilateral calves noted. Lovenox given as prophylaxis. Patient will return in the morning for duplex ultrasound. Patient states understanding of plan and is agreeable. Case discussed with Dr. Manus Gunning who also evaluated patient and agrees with plan of care.        Trevor Mace,  PA-C 02/23/12 2137

## 2012-02-23 NOTE — ED Provider Notes (Signed)
This chart was scribed for Glynn Octave, MD by Bennett Scrape, ED Scribe. This patient was seen in room A01C/A01C and the patient's care was started at 8:25 PM.  Rebecca Rich is a 67 y.o. female who presents to the Emergency Department complaining of intermittent bilateral leg cramps with associated throbbing, "numb" pain  that started this morning after the pt was up all night watching movies. Pt states that the pain started in the anterior upper thighs and radiates down the legs, the left worse than the right. She states that she has chronic swelling to her legs and denies changes. She has a h/o DM but denies any prior diagnoses of neuropathy. She denies having prior episodes of similar symptoms. She denies SOB, CP, and abdominal pain as associated symptoms.  PE CONSTITUTIONAL: morbidly obese, no distress ABDOMEN: soft, non-tender MUSCULOSKELETAL: 2+ DP and PT pulses, no asymmetry, no calf tenderness  8:30 PM-Discussed treatment plan which includes d-dimer test and return for [place] tomorrow with pt at bedside and pt agreed to plan.   I personally performed the services described in this documentation, which was scribed in my presence. The recorded information has been reviewed and is accurate.   Glynn Octave, MD 02/23/12 2215

## 2012-02-24 ENCOUNTER — Other Ambulatory Visit (HOSPITAL_COMMUNITY): Payer: Self-pay | Admitting: Pediatrics

## 2012-02-24 ENCOUNTER — Other Ambulatory Visit: Payer: Self-pay | Admitting: *Deleted

## 2012-02-24 ENCOUNTER — Ambulatory Visit (HOSPITAL_COMMUNITY)
Admission: RE | Admit: 2012-02-24 | Discharge: 2012-02-24 | Disposition: A | Discharge status: Home / Self Care | Payer: Medicare Other | Source: Ambulatory Visit | Attending: Internal Medicine | Admitting: Internal Medicine

## 2012-02-24 DIAGNOSIS — M79606 Pain in leg, unspecified: Secondary | ICD-10-CM

## 2012-02-24 DIAGNOSIS — M79609 Pain in unspecified limb: Secondary | ICD-10-CM | POA: Diagnosis not present

## 2012-02-24 DIAGNOSIS — M7989 Other specified soft tissue disorders: Secondary | ICD-10-CM | POA: Diagnosis not present

## 2012-02-24 DIAGNOSIS — M25519 Pain in unspecified shoulder: Secondary | ICD-10-CM | POA: Diagnosis not present

## 2012-02-24 NOTE — Progress Notes (Signed)
Bilateral:  No evidence of DVT, superficial thrombosis, or Baker's Cyst.   

## 2012-02-26 DIAGNOSIS — M25519 Pain in unspecified shoulder: Secondary | ICD-10-CM | POA: Diagnosis not present

## 2012-03-02 DIAGNOSIS — M25519 Pain in unspecified shoulder: Secondary | ICD-10-CM | POA: Diagnosis not present

## 2012-03-09 DIAGNOSIS — M25519 Pain in unspecified shoulder: Secondary | ICD-10-CM | POA: Diagnosis not present

## 2012-03-18 ENCOUNTER — Other Ambulatory Visit: Payer: Self-pay | Admitting: Internal Medicine

## 2012-03-24 DIAGNOSIS — S43499A Other sprain of unspecified shoulder joint, initial encounter: Secondary | ICD-10-CM | POA: Diagnosis not present

## 2012-03-24 DIAGNOSIS — M25519 Pain in unspecified shoulder: Secondary | ICD-10-CM | POA: Diagnosis not present

## 2012-03-26 DIAGNOSIS — S46819A Strain of other muscles, fascia and tendons at shoulder and upper arm level, unspecified arm, initial encounter: Secondary | ICD-10-CM | POA: Diagnosis not present

## 2012-03-26 DIAGNOSIS — S43499A Other sprain of unspecified shoulder joint, initial encounter: Secondary | ICD-10-CM | POA: Diagnosis not present

## 2012-04-06 DIAGNOSIS — S43499A Other sprain of unspecified shoulder joint, initial encounter: Secondary | ICD-10-CM | POA: Diagnosis not present

## 2012-04-06 DIAGNOSIS — S46819A Strain of other muscles, fascia and tendons at shoulder and upper arm level, unspecified arm, initial encounter: Secondary | ICD-10-CM | POA: Diagnosis not present

## 2012-04-15 DIAGNOSIS — S46819A Strain of other muscles, fascia and tendons at shoulder and upper arm level, unspecified arm, initial encounter: Secondary | ICD-10-CM | POA: Diagnosis not present

## 2012-04-15 DIAGNOSIS — S43499A Other sprain of unspecified shoulder joint, initial encounter: Secondary | ICD-10-CM | POA: Diagnosis not present

## 2012-05-11 DIAGNOSIS — E119 Type 2 diabetes mellitus without complications: Secondary | ICD-10-CM | POA: Diagnosis not present

## 2012-05-11 DIAGNOSIS — H35059 Retinal neovascularization, unspecified, unspecified eye: Secondary | ICD-10-CM | POA: Diagnosis not present

## 2012-05-13 ENCOUNTER — Other Ambulatory Visit (INDEPENDENT_AMBULATORY_CARE_PROVIDER_SITE_OTHER): Payer: Medicare Other

## 2012-05-13 DIAGNOSIS — E119 Type 2 diabetes mellitus without complications: Secondary | ICD-10-CM

## 2012-05-13 DIAGNOSIS — H26019 Infantile and juvenile cortical, lamellar, or zonular cataract, unspecified eye: Secondary | ICD-10-CM | POA: Diagnosis not present

## 2012-05-13 DIAGNOSIS — H52229 Regular astigmatism, unspecified eye: Secondary | ICD-10-CM | POA: Diagnosis not present

## 2012-05-13 DIAGNOSIS — H524 Presbyopia: Secondary | ICD-10-CM | POA: Diagnosis not present

## 2012-05-13 DIAGNOSIS — H5200 Hypermetropia, unspecified eye: Secondary | ICD-10-CM | POA: Diagnosis not present

## 2012-05-13 DIAGNOSIS — H538 Other visual disturbances: Secondary | ICD-10-CM | POA: Diagnosis not present

## 2012-05-14 ENCOUNTER — Other Ambulatory Visit: Payer: Medicare Other

## 2012-05-21 ENCOUNTER — Ambulatory Visit: Payer: Medicare Other | Admitting: Internal Medicine

## 2012-05-24 ENCOUNTER — Encounter: Payer: Self-pay | Admitting: Internal Medicine

## 2012-05-24 ENCOUNTER — Ambulatory Visit (INDEPENDENT_AMBULATORY_CARE_PROVIDER_SITE_OTHER): Payer: Medicare Other | Admitting: Internal Medicine

## 2012-05-24 VITALS — BP 160/60 | HR 81 | Temp 98.6°F | Wt 287.0 lb

## 2012-05-24 DIAGNOSIS — R011 Cardiac murmur, unspecified: Secondary | ICD-10-CM

## 2012-05-24 DIAGNOSIS — I10 Essential (primary) hypertension: Secondary | ICD-10-CM

## 2012-05-24 DIAGNOSIS — E119 Type 2 diabetes mellitus without complications: Secondary | ICD-10-CM

## 2012-05-24 DIAGNOSIS — R21 Rash and other nonspecific skin eruption: Secondary | ICD-10-CM | POA: Diagnosis not present

## 2012-05-24 DIAGNOSIS — Z23 Encounter for immunization: Secondary | ICD-10-CM | POA: Diagnosis not present

## 2012-05-24 NOTE — Patient Instructions (Signed)
Diabetes is good . Blood pressure reading is elevated  Today   Recheck  BP readings and to make sure is controlled.      Bring in machine with you  To decide on further   Intervention  I will reviewed  Record  May want to repeat echo  Before next  Visit.

## 2012-05-24 NOTE — Progress Notes (Signed)
Chief Complaint  Patient presents with  . Follow-up    HPI: Patient comes in today for follow up of  multiple medical problems.  Blood glucose and obesity she has been losing some weight using a personal trainer At home was 269   And doing on herbalife and a trainer   And   Lost 12 inches. .  [employer] to be 240  By December .  Vision ; Has seen dr Luciana Axe and no diabetic retinopathy. Getting new glasses.     Therapy for shoulder some help for pain  Has a tear   Assoc with arthritis and can mend itself for surgery.  Went back on bioflex and feels that has helped her a lot. Denies any new numbness weakness unusual infections has a rash that is itchy behind her left knee using lotion on it getting a bit smaller.  Hasn't checked her blood pressure readings this week but is checked it up to 3 times a week and usually gets higher than we do an office. Has doubled up on her blood pressure medication since her last visit as advised.  Think she may have gotten her last pneumonia shot before she was age 7  Still hasn't gotten a mammogram for various reasons  Since her last visit she had worsening leg cramps and went to the ED on advice and had a negative Doppler it was felt to be more arthritic related then from a vascular problem.   ROS: See pertinent positives and negatives per HPI. No current chest pain shortness of breath or syncope  Past Medical History  Diagnosis Date  . DIABETES MELLITUS, TYPE II 12/08/2006  . HYPERLIPIDEMIA 12/08/2006  . OBESITY 09/24/2009  . Morbid obesity 12/08/2006  . OBSTRUCTIVE SLEEP APNEA 12/08/2006  . HYPERTENSION 12/08/2006  . Acute bronchospasm 08/24/2009  . INFECTION, SKIN AND SOFT TISSUE 07/28/2008  . 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 OSTEOARTHRITIS 12/08/2006  . SHOULDER PAIN, RIGHT 02/07/2008  . Swelling of limb 07/28/2008  . CARDIAC MURMUR 12/08/2006  . RUQ PAIN 06/16/2008  . THYROID FUNCTION TEST, ABNORMAL 12/08/2006  . LIVER FUNCTION TESTS, ABNORMAL 07/28/2008  .  COLONIC POLYPS, HX OF 12/08/2006  . 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 Gallbladder/common duct stone, without infection, with obstruction 03/01/2010    removed ercp  . Acute sphenoidal sinusitis 02/26/2010    Qualifier: Diagnosis of  By: Fabian Sharp MD, Neta Mends     Family History  Problem Relation Age of Onset  . Other Mother     blood clots  . Heart disease Brother   . Heart disease Sister   . Heart disease Brother   . Heart disease Brother   . Stroke Sister   . Throat cancer Father   . Cervical cancer Mother   . Diabetes Other     all siblings, 4 brothers, 5 sisters    History   Social History  . Marital Status: Single    Spouse Name: N/A    Number of Children: N/A  . Years of Education: N/A   Occupational History  . retired    Social History Main Topics  . Smoking status: Never Smoker   . Smokeless tobacco: None  . Alcohol Use: No  . Drug Use: No  . Sexually Active: None   Other Topics Concern  . None   Social History Narrative   Master level education in math   Pt is currently retired   Pt is divorced with children   Recently had to move had a  break in and thus away from her pool exercise    Outpatient Encounter Prescriptions as of 05/24/2012  Medication Sig Dispense Refill  . calcium-vitamin D 185 MG TABS Take 185 mg by mouth daily.        . cholecalciferol (VITAMIN D) 1000 UNITS tablet Take 1,000 Units by mouth daily.      . hydrochlorothiazide (HYDRODIURIL) 25 MG tablet Take 25 mg by mouth every evening.      Marland Kitchen levothyroxine (SYNTHROID, LEVOTHROID) 25 MCG tablet take 1 tablet by mouth once daily  30 tablet  2  . Multiple Vitamins-Minerals (CENTRUM SILVER PO) Take 1 tablet by mouth daily.        Marland Kitchen OVER THE COUNTER MEDICATION Herbal life      . ramipril (ALTACE) 10 MG capsule Take 2 capsules (20 mg total) by mouth daily.  180 capsule  0  . [DISCONTINUED] ramipril (ALTACE) 10 MG capsule Take 1 capsule (10 mg total) by mouth daily.  90 capsule  0   No  facility-administered encounter medications on file as of 05/24/2012.    EXAM:  BP 160/60  Pulse 81  Temp(Src) 98.6 F (37 C) (Oral)  Wt 287 lb (130.182 kg)  BMI 54.26 kg/m2  SpO2 97%  Body mass index is 54.26 kg/(m^2).  Wt Readings from Last 3 Encounters:  05/24/12 287 lb (130.182 kg)  01/20/12 284 lb 11.2 oz (129.139 kg)  09/25/11 280 lb (127.007 kg)     GENERAL: vitals reviewed and listed above, alert, oriented, appears well hydrated and in no acute distress  HEENT: atraumatic, conjunctiva  clear, no obvious abnormalities on inspection of external nose and ears OP : no lesion edema or exudate   NECK: no obvious masses on inspection palpation   LUNGS: clear to auscultation bilaterally, no wheezes, rales or rhonchi, good air movement  CV: HRRR, a fairly prominent left upper chest murmur but don't hear it in the neck. no clubbing cyanosis ornl cap refill  trace to 1+ edema in the feet MS: moves all extremities without noticeable focal  abnormality Below the left popliteal fossa there is a faint pigmented ovoid rash without vesicles or redness eczematous flaky like PSYCH: pleasant and cooperative, no obvious depression or anxiety Lab Results  Component Value Date   WBC 5.4 02/23/2012   HGB 12.8 02/23/2012   HCT 38.0 02/23/2012   PLT 140* 02/23/2012   GLUCOSE 112* 02/23/2012   CHOL 119 09/25/2011   TRIG 102.0 09/25/2011   HDL 52.00 09/25/2011   LDLCALC 47 09/25/2011   ALT 18 09/25/2011   AST 18 09/25/2011   NA 138 02/23/2012   K 3.5 02/23/2012   CL 100 02/23/2012   CREATININE 0.99 02/23/2012   BUN 17 02/23/2012   CO2 27 02/23/2012   TSH 3.36 01/06/2012   HGBA1C 6.3 05/13/2012   MICROALBUR 0.4 08/02/2009    ASSESSMENT AND PLAN:  Discussed the following assessment and plan:  DIABETES MELLITUS, TYPE II - controlled   at this time without  complicaitons ls controlled  HYPERTENSION - up today  has inc altace to 2 per day may need  ccb other add medication - Plan: 2D Echocardiogram  without contrast  Rash and nonspecific skin eruption - left  post   Systolic murmur - Seems more prominent had an echo in 2007, has had a murmur her whole life would recheck echo consider seeing cardiology - Plan: 2D Echocardiogram without contrast  Morbid obesity - States she is working on it weight loss  etc. Local care for the rash topical emollients hydrocortisone if needed blood pressure is up-to-date does not appear to be controlled have her followup in about 6 weeks to bring back her monitor continue on 20 mg of Altac.  Blood sugars are excellent at this time. -Patient advised to return or notify health care team  if symptoms worsen or persist or new concerns arise.  Patient Instructions  Diabetes is good . Blood pressure reading is elevated  Today   Recheck  BP readings and to make sure is controlled.      Bring in machine with you  To decide on further   Intervention  I will reviewed  Record  May want to repeat echo  Before next  Visit.    Neta Mends. Panosh M.D.  Pneumovax 23 given today as per guidelines

## 2012-05-30 ENCOUNTER — Other Ambulatory Visit: Payer: Self-pay | Admitting: Internal Medicine

## 2012-06-01 ENCOUNTER — Ambulatory Visit (HOSPITAL_COMMUNITY): Payer: Medicare Other | Attending: Internal Medicine | Admitting: Radiology

## 2012-06-01 DIAGNOSIS — G4733 Obstructive sleep apnea (adult) (pediatric): Secondary | ICD-10-CM | POA: Insufficient documentation

## 2012-06-01 DIAGNOSIS — E785 Hyperlipidemia, unspecified: Secondary | ICD-10-CM | POA: Insufficient documentation

## 2012-06-01 DIAGNOSIS — E119 Type 2 diabetes mellitus without complications: Secondary | ICD-10-CM | POA: Diagnosis not present

## 2012-06-01 DIAGNOSIS — I10 Essential (primary) hypertension: Secondary | ICD-10-CM | POA: Diagnosis not present

## 2012-06-01 DIAGNOSIS — R011 Cardiac murmur, unspecified: Secondary | ICD-10-CM

## 2012-06-01 NOTE — Progress Notes (Signed)
Echocardiogram performed.  

## 2012-06-04 ENCOUNTER — Encounter: Payer: Self-pay | Admitting: Pulmonary Disease

## 2012-06-04 ENCOUNTER — Ambulatory Visit (INDEPENDENT_AMBULATORY_CARE_PROVIDER_SITE_OTHER): Payer: Medicare Other | Admitting: Pulmonary Disease

## 2012-06-04 VITALS — BP 138/62 | HR 75 | Temp 99.1°F | Ht 61.0 in | Wt 274.4 lb

## 2012-06-04 DIAGNOSIS — G4733 Obstructive sleep apnea (adult) (pediatric): Secondary | ICD-10-CM | POA: Diagnosis not present

## 2012-06-04 NOTE — Patient Instructions (Addendum)
Continue with weight loss.  You are doing great! Keep up with mask changes and supplies. followup with me in one year.

## 2012-06-04 NOTE — Progress Notes (Signed)
  Subjective:    Patient ID: Rebecca Rich, female    DOB: [birth year], 67 y.o.   MRN: [id number]  HPI The patient comes in today for followup of her obstructive sleep apnea.  She is wearing CPAP compliantly, and is having no issues with her mask fit or pressure.  She feels that she is sleeping well, and has no daytime alertness issues.  She has lost 18 pounds since her last visit here, and I have commended her on this.   Review of Systems  Constitutional: Negative for fever and unexpected weight change.  HENT: Negative for ear pain, nosebleeds, congestion, sore throat, rhinorrhea, sneezing, trouble swallowing, dental problem, postnasal drip and sinus pressure.   Eyes: Negative for redness and itching.  Respiratory: Negative for cough, chest tightness, shortness of breath and wheezing.   Cardiovascular: Negative for palpitations and leg swelling.  Gastrointestinal: Negative for nausea and vomiting.  Genitourinary: Negative for dysuria.  Musculoskeletal: Negative for joint swelling.  Skin: Negative for rash.  Neurological: Negative for headaches.  Hematological: Does not bruise/bleed easily.  Psychiatric/Behavioral: Negative for dysphoric mood. The patient is not nervous/anxious.        Objective:   Physical Exam Obese female in no acute distress Nose without purulence or discharge noted No skin breakdown or pressure necrosis from the CPAP mask Neck without lymphadenopathy or thyromegaly Lower extremities with mild edema, no cyanosis Alert and oriented, moves all 4 extremities.  Does not appear to be sleepy.       Assessment & Plan:

## 2012-06-04 NOTE — Assessment & Plan Note (Signed)
She is doing very well with CPAP, and has even lost considerable weight since her last visit.  I have asked her to keep up with her supplies, and to continue working on weight loss.  We'll see her back in one year.

## 2012-06-18 ENCOUNTER — Other Ambulatory Visit: Payer: Self-pay | Admitting: Family Medicine

## 2012-06-18 MED ORDER — RAMIPRIL 10 MG PO CAPS: 20.0000 mg | ORAL_CAPSULE | Freq: Every day | ORAL | Status: DC

## 2012-06-22 ENCOUNTER — Other Ambulatory Visit: Payer: Self-pay | Admitting: Family Medicine

## 2012-06-23 DIAGNOSIS — Z1231 Encounter for screening mammogram for malignant neoplasm of breast: Secondary | ICD-10-CM | POA: Diagnosis not present

## 2012-06-29 ENCOUNTER — Other Ambulatory Visit: Payer: Self-pay | Admitting: Internal Medicine

## 2012-06-29 ENCOUNTER — Encounter: Payer: Self-pay | Admitting: Internal Medicine

## 2012-07-05 ENCOUNTER — Encounter: Payer: Self-pay | Admitting: Internal Medicine

## 2012-07-05 ENCOUNTER — Ambulatory Visit (INDEPENDENT_AMBULATORY_CARE_PROVIDER_SITE_OTHER): Payer: Medicare Other | Admitting: Internal Medicine

## 2012-07-05 VITALS — BP 136/50 | HR 83 | Temp 98.8°F | Wt 266.0 lb

## 2012-07-05 DIAGNOSIS — E119 Type 2 diabetes mellitus without complications: Secondary | ICD-10-CM | POA: Diagnosis not present

## 2012-07-05 DIAGNOSIS — I10 Essential (primary) hypertension: Secondary | ICD-10-CM

## 2012-07-05 DIAGNOSIS — G4733 Obstructive sleep apnea (adult) (pediatric): Secondary | ICD-10-CM

## 2012-07-05 NOTE — Patient Instructions (Addendum)
Continue weight loss as you are doing .  For now we can stay on same dose of medication consider adding   Other blood pressusre medicine   Can try taking the ramipril at night  You echo test is ok some thickening on the heart muscle.   Check  With  The company about there wrist machine .

## 2012-07-05 NOTE — Progress Notes (Signed)
Chief Complaint  Patient presents with  . Follow-up  . Diabetes    HPI: Patient is here for followup of her diabetes hypertension see last visit. She did have an echocardiogram that showed mild LVH no major valvular problems normal LV function.  BP  Readings; and brought wrist cuff   readings tend to be in the 1 5160 range higher than in the office. Readings 140 and some 160 when out of med  .    Was normal at  Pulmonary office .  OSA stab;le her wrist machine is 69 years old She's been able to lose weight using herbal life continuing. Feels pretty good otherwise.  He also compliant with medication. Left shoulder still bothers her not taking anti-inflammatories  Gets what she calls bee stings on her right arm the coming like little shocks mostly positional depending on how she is holding her arm in front of the computer. No weakness ROS: See pertinent positives and negatives per HPI.  Past Medical History  Diagnosis Date  . DIABETES MELLITUS, TYPE II 12/08/2006  . HYPERLIPIDEMIA 12/08/2006  . OBESITY 09/24/2009  . Morbid obesity 12/08/2006  . OBSTRUCTIVE SLEEP APNEA 12/08/2006  . HYPERTENSION 12/08/2006  . Acute bronchospasm 08/24/2009  . INFECTION, SKIN AND SOFT TISSUE 07/28/2008  . 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 OSTEOARTHRITIS 12/08/2006  . SHOULDER PAIN, RIGHT 02/07/2008  . Swelling of limb 07/28/2008  . CARDIAC MURMUR 12/08/2006  . RUQ PAIN 06/16/2008  . THYROID FUNCTION TEST, ABNORMAL 12/08/2006  . LIVER FUNCTION TESTS, ABNORMAL 07/28/2008  . COLONIC POLYPS, HX OF 12/08/2006  . 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 Gallbladder/common duct stone, without infection, with obstruction 03/01/2010    removed ercp  . Acute sphenoidal sinusitis 02/26/2010    Qualifier: Diagnosis of  By: Fabian Sharp MD, Neta Mends     Family History  Problem Relation Age of Onset  . Other Mother     blood clots  . Heart disease Brother   . Heart disease Sister   . Heart disease Brother   . Heart disease Brother   .  Stroke Sister   . Throat cancer Father   . Cervical cancer Mother   . Diabetes Other     all siblings, 4 brothers, 5 sisters    History   Social History  . Marital Status: Single    Spouse Name: N/A    Number of Children: N/A  . Years of Education: N/A   Occupational History  . retired    Social History Main Topics  . Smoking status: Never Smoker   . Smokeless tobacco: None  . Alcohol Use: No  . Drug Use: No  . Sexually Active: None   Other Topics Concern  . None   Social History Narrative   Master level education in math   Pt is currently retired   Pt is divorced with children   Recently had to move had a break in and thus away from her pool exercise    Outpatient Encounter Prescriptions as of 07/05/2012  Medication Sig Dispense Refill  . calcium-vitamin D 185 MG TABS Take 185 mg by mouth daily.        . cholecalciferol (VITAMIN D) 1000 UNITS tablet Take 1,000 Units by mouth daily.      . hydrochlorothiazide (HYDRODIURIL) 25 MG tablet Take 25 mg by mouth every evening.      Marland Kitchen levothyroxine (SYNTHROID, LEVOTHROID) 25 MCG tablet take 1 tablet by mouth once daily  30 tablet  2  . Multiple  Vitamins-Minerals (CENTRUM SILVER PO) Take 1 tablet by mouth daily.        Marland Kitchen OVER THE COUNTER MEDICATION Herbal life      . ramipril (ALTACE) 10 MG capsule Take 2 capsules (20 mg total) by mouth daily.  180 capsule  3   No facility-administered encounter medications on file as of 07/05/2012.    EXAM:  BP 136/50  Pulse 83  Temp(Src) 98.8 F (37.1 C) (Oral)  Wt 266 lb (120.657 kg)  BMI 50.29 kg/m2  SpO2 97%  Body mass index is 50.29 kg/(m^2).  GENERAL: vitals reviewed and listed above, alert, oriented, appears well hydrated and in no acute distress  HEENT: atraumatic, conjunctiva  clear, no obvious abnormalities on inspection of external nose and ears  NECK: no obvious masses on inspection palpation   CV: HRRR, no clubbing cyanosis  nl cap refill   137/70 large cuff left  /   Personal machine 149/69 wrist.  MS: moves all extremities without noticeable focal  abnormality PSYCH: pleasant and cooperative, no obvious depression or anxiety Wt Readings from Last 3 Encounters:  07/05/12 266 lb (120.657 kg)  06/04/12 274 lb 6.4 oz (124.467 kg)  05/24/12 287 lb (130.182 kg)   149/69  Lab Results  Component Value Date   WBC 5.4 02/23/2012   HGB 12.8 02/23/2012   HCT 38.0 02/23/2012   PLT 140* 02/23/2012   GLUCOSE 112* 02/23/2012   CHOL 119 09/25/2011   TRIG 102.0 09/25/2011   HDL 52.00 09/25/2011   LDLCALC 47 09/25/2011   ALT 18 09/25/2011   AST 18 09/25/2011   NA 138 02/23/2012   K 3.5 02/23/2012   CL 100 02/23/2012   CREATININE 0.99 02/23/2012   BUN 17 02/23/2012   CO2 27 02/23/2012   TSH 3.36 01/06/2012   HGBA1C 6.3 05/13/2012   MICROALBUR 0.4 08/02/2009    ASSESSMENT AND PLAN:  Discussed the following assessment and plan:  HYPERTENSION - Uncertain control readings and office seemed to be okay uncertain of her wrist cuff is accurate. She could have reverse white coat.   Morbid obesity - Losing weight to continue  OBSTRUCTIVE SLEEP APNEA - Control 137/70 large cuff left  /  Personal machine 149/69 wrist.  Consider adding medication patient has been to do this she is losing weight and her readings in the office are pretty good we'll wait have her come back in a couple months when she's due for lipids and A1c and readdress. -Patient advised to return or notify health care team  if symptoms worsen or persist or new concerns arise.  Patient Instructions  Continue weight loss as you are doing .  For now we can stay on same dose of medication consider adding   Other blood pressusre medicine   Can try taking the ramipril at night  You echo test is ok some thickening on the heart muscle.   Check  With  The company about there wrist machine .       Neta Mends. Panosh M.D.  Total visit > 50% spent counseling and coordinating care

## 2012-08-05 ENCOUNTER — Other Ambulatory Visit: Payer: Self-pay

## 2012-09-06 ENCOUNTER — Other Ambulatory Visit: Payer: Medicare Other

## 2012-09-08 ENCOUNTER — Other Ambulatory Visit (INDEPENDENT_AMBULATORY_CARE_PROVIDER_SITE_OTHER): Payer: Medicare Other

## 2012-09-08 DIAGNOSIS — E785 Hyperlipidemia, unspecified: Secondary | ICD-10-CM | POA: Diagnosis not present

## 2012-09-08 DIAGNOSIS — E1110 Type 2 diabetes mellitus with ketoacidosis without coma: Secondary | ICD-10-CM

## 2012-09-08 LAB — HEMOGLOBIN A1C: Hgb A1c MFr Bld: 6.2 % (ref 4.6–6.5)

## 2012-09-08 LAB — LIPID PANEL
LDL Cholesterol: 60 mg/dL (ref 0–99)
VLDL: 24 mg/dL (ref 0.0–40.0)

## 2012-09-13 ENCOUNTER — Ambulatory Visit (INDEPENDENT_AMBULATORY_CARE_PROVIDER_SITE_OTHER): Payer: Medicare Other | Admitting: Internal Medicine

## 2012-09-13 ENCOUNTER — Encounter: Payer: Self-pay | Admitting: Internal Medicine

## 2012-09-13 VITALS — BP 140/60 | HR 78 | Temp 98.4°F | Wt 270.0 lb

## 2012-09-13 DIAGNOSIS — I10 Essential (primary) hypertension: Secondary | ICD-10-CM | POA: Diagnosis not present

## 2012-09-13 DIAGNOSIS — E119 Type 2 diabetes mellitus without complications: Secondary | ICD-10-CM | POA: Diagnosis not present

## 2012-09-13 DIAGNOSIS — L989 Disorder of the skin and subcutaneous tissue, unspecified: Secondary | ICD-10-CM | POA: Insufficient documentation

## 2012-09-13 MED ORDER — CHLORTHALIDONE 25 MG PO TABS: 25.0000 mg | ORAL_TABLET | Freq: Every day | ORAL | Status: DC

## 2012-09-13 NOTE — Progress Notes (Signed)
Chief Complaint  Patient presents with  . Follow-up  . Diabetes  . Hypertension    HPI: Patient comes in today for follow up of  multiple medical problems.  BP   machine has been checked and she is getting 140s to 145 range on her readings.  DM added    Exercise classes   Water  Aerobics.  adn blance exercises  no numbness infections vision changes Lost to 262.6  At her scale and goal is 12 more pounds in a month  Stopped candy cookies and  perhaps once a month.  And herbalife  Using herbalife .   Better  Sizes  Clothes .  ROS: See pertinent positives and negatives per HPI. Sensation in middle of back    Sometimes bloo din diushcarge.  In the past we thought it was a keloid. Cannot see the area. She notices some wetness and discharge on her clothes. Wt Readings from Last 3 Encounters:  09/13/12 270 lb (122.471 kg)  07/05/12 266 lb (120.657 kg)  06/04/12 274 lb 6.4 oz (124.467 kg)     Past Medical History  Diagnosis Date  . DIABETES MELLITUS, TYPE II 12/08/2006  . HYPERLIPIDEMIA 12/08/2006  . OBESITY 09/24/2009  . Morbid obesity 12/08/2006  . OBSTRUCTIVE SLEEP APNEA 12/08/2006  . HYPERTENSION 12/08/2006  . Acute bronchospasm 08/24/2009  . INFECTION, SKIN AND SOFT TISSUE 07/28/2008  . 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 OSTEOARTHRITIS 12/08/2006  . SHOULDER PAIN, RIGHT 02/07/2008  . Swelling of limb 07/28/2008  . CARDIAC MURMUR 12/08/2006  . RUQ PAIN 06/16/2008  . THYROID FUNCTION TEST, ABNORMAL 12/08/2006  . LIVER FUNCTION TESTS, ABNORMAL 07/28/2008  . COLONIC POLYPS, HX OF 12/08/2006  . 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 Gallbladder/common duct stone, without infection, with obstruction 03/01/2010    removed ercp  . Acute sphenoidal sinusitis 02/26/2010    Qualifier: Diagnosis of  By: Fabian Sharp MD, Neta Mends     Family History  Problem Relation Age of Onset  . Other Mother     blood clots  . Heart disease Brother   . Heart disease Sister   . Heart disease Brother   . Heart disease Brother   .  Stroke Sister   . Throat cancer Father   . Cervical cancer Mother   . Diabetes Other     all siblings, 4 brothers, 5 sisters    History   Social History  . Marital Status: Single    Spouse Name: N/A    Number of Children: N/A  . Years of Education: N/A   Occupational History  . retired    Social History Main Topics  . Smoking status: Never Smoker   . Smokeless tobacco: None  . Alcohol Use: No  . Drug Use: No  . Sexual Activity: None   Other Topics Concern  . None   Social History Narrative   Master level education in math   Pt is currently retired   Pt is divorced with children   Recently had to move had a break in and thus away from her pool exercise    Outpatient Encounter Prescriptions as of 09/13/2012  Medication Sig Dispense Refill  . calcium-vitamin D 185 MG TABS Take 185 mg by mouth daily.        . cholecalciferol (VITAMIN D) 1000 UNITS tablet Take 1,000 Units by mouth daily.      Marland Kitchen levothyroxine (SYNTHROID, LEVOTHROID) 25 MCG tablet take 1 tablet by mouth once daily  30 tablet  2  .  Multiple Vitamins-Minerals (CENTRUM SILVER PO) Take 1 tablet by mouth daily.        Marland Kitchen OVER THE COUNTER MEDICATION Herbal life      . ramipril (ALTACE) 10 MG capsule Take 2 capsules (20 mg total) by mouth daily.  180 capsule  3  . [DISCONTINUED] hydrochlorothiazide (HYDRODIURIL) 25 MG tablet Take 25 mg by mouth every evening.      . chlorthalidone (HYGROTON) 25 MG tablet Take 1 tablet (25 mg total) by mouth daily.  90 tablet  1   No facility-administered encounter medications on file as of 09/13/2012.    EXAM:  BP 140/60  Pulse 78  Temp(Src) 98.4 F (36.9 C) (Oral)  Wt 270 lb (122.471 kg)  BMI 51.04 kg/m2  SpO2 98%  Body mass index is 51.04 kg/(m^2).  GENERAL: vitals reviewed and listed above, alert, oriented, appears well hydrated and in no acute distress HEENT: atraumatic, conjunctiva  clear, no obvious abnormalities on inspection of external nose and ears  NECK: no  obvious masses on inspection palpation   LUNGS: clear to auscultation bilaterally, no wheezes, rales or rhonchi, good air movement Skin reveals multiple keloids and then the middle thorax back there is a pinpoint your TD borderline to area with induration on palpation but no mass 2 view. I don't see a discharge or blood at this time. CV: HRRR, no clubbing cyanosis or nl cap refill  MS: moves all extremities without noticeable focal  abnormality PSYCH: pleasant and cooperative, no obvious depression or anxiety Lab Results  Component Value Date   WBC 5.4 02/23/2012   HGB 12.8 02/23/2012   HCT 38.0 02/23/2012   PLT 140* 02/23/2012   GLUCOSE 112* 02/23/2012   CHOL 136 09/08/2012   TRIG 120.0 09/08/2012   HDL 51.60 09/08/2012   LDLCALC 60 09/08/2012   ALT 18 09/25/2011   AST 18 09/25/2011   NA 138 02/23/2012   K 3.5 02/23/2012   CL 100 02/23/2012   CREATININE 0.99 02/23/2012   BUN 17 02/23/2012   CO2 27 02/23/2012   TSH 3.36 01/06/2012   HGBA1C 6.2 09/08/2012   MICROALBUR 0.4 08/02/2009    ASSESSMENT AND PLAN:  Discussed the following assessment and plan:  DIABETES MELLITUS, TYPE II - Excellent control improved continue  HYPERTENSION - Still not quite at goal continued lifestyle change HCTZ to  chlorthalidone monitor potassium level, continue acei  Skin lesion of back - concern about atypical  behavior ? cyst but dc and blood at times. ? need for bx of removal but gets keloid. - Plan: Ambulatory referral to Dermatology  -Patient advised to return or notify health care team  if symptoms worsen or persist or new concerns arise.  Patient Instructions  Will plan on  Getting dermatology to see  The lesion on the back  This could be a cyst but is atypical for draining the way it is happening .     Diabetes is much better   Continue lifestyle intervention healthy eating and exercise . Substituting   A different     diuretic to see if better  BP control. Stop the hctz    Take chlorthalidone once a  day  In the meantime.     Neta Mends. Panosh M.D.

## 2012-09-13 NOTE — Patient Instructions (Addendum)
Will plan on  Getting dermatology to see  The lesion on the back  This could be a cyst but is atypical for draining the way it is happening .     Diabetes is much better   Continue lifestyle intervention healthy eating and exercise . Substituting   A different     diuretic to see if better  BP control. Stop the hctz    Take chlorthalidone once a day  In the meantime.

## 2012-10-11 ENCOUNTER — Other Ambulatory Visit: Payer: Self-pay | Admitting: Dermatology

## 2012-10-11 DIAGNOSIS — L820 Inflamed seborrheic keratosis: Secondary | ICD-10-CM | POA: Diagnosis not present

## 2012-10-11 DIAGNOSIS — D485 Neoplasm of uncertain behavior of skin: Secondary | ICD-10-CM | POA: Diagnosis not present

## 2012-10-13 ENCOUNTER — Other Ambulatory Visit (INDEPENDENT_AMBULATORY_CARE_PROVIDER_SITE_OTHER): Payer: Medicare Other

## 2012-10-13 DIAGNOSIS — I10 Essential (primary) hypertension: Secondary | ICD-10-CM

## 2012-10-13 LAB — BASIC METABOLIC PANEL
Calcium: 9.7 mg/dL (ref 8.4–10.5)
Creatinine, Ser: 0.9 mg/dL (ref 0.4–1.2)
GFR: 81.82 mL/min (ref >60.00)
Glucose, Bld: 126 mg/dL — ABNORMAL HIGH (ref 70–99)
Sodium: 137 mEq/L (ref 135–145)

## 2012-10-14 DIAGNOSIS — Z0000 Encounter for general adult medical examination without abnormal findings: Secondary | ICD-10-CM | POA: Diagnosis not present

## 2012-10-15 ENCOUNTER — Other Ambulatory Visit: Payer: Self-pay | Admitting: Family Medicine

## 2012-10-15 MED ORDER — POTASSIUM CHLORIDE CRYS ER 10 MEQ PO TBCR: 20.0000 meq | EXTENDED_RELEASE_TABLET | Freq: Every day | ORAL | Status: DC

## 2012-10-26 ENCOUNTER — Other Ambulatory Visit: Payer: Self-pay | Admitting: Gastroenterology

## 2012-10-26 DIAGNOSIS — Z86010 Personal history of colonic polyps: Secondary | ICD-10-CM | POA: Diagnosis not present

## 2012-10-26 DIAGNOSIS — R198 Other specified symptoms and signs involving the digestive system and abdomen: Secondary | ICD-10-CM | POA: Diagnosis not present

## 2012-10-26 DIAGNOSIS — Z1211 Encounter for screening for malignant neoplasm of colon: Secondary | ICD-10-CM | POA: Diagnosis not present

## 2012-10-27 NOTE — Addendum Note (Signed)
Addended by: Lorenza Burton on: 10/27/2012 06:54 AM   Modules accepted: Orders

## 2012-11-16 ENCOUNTER — Other Ambulatory Visit (INDEPENDENT_AMBULATORY_CARE_PROVIDER_SITE_OTHER): Payer: Medicare Other

## 2012-11-16 DIAGNOSIS — I10 Essential (primary) hypertension: Secondary | ICD-10-CM | POA: Diagnosis not present

## 2012-11-16 LAB — BASIC METABOLIC PANEL
Chloride: 103 mEq/L (ref 96–112)
Creatinine, Ser: 0.8 mg/dL (ref 0.4–1.2)
GFR: 91.3 mL/min (ref >60.00)
Potassium: 3.8 mEq/L (ref 3.5–5.1)

## 2012-11-19 NOTE — Progress Notes (Signed)
Quick Note:  Called and spoke with pt and pt is aware. ______ 

## 2012-12-02 ENCOUNTER — Other Ambulatory Visit: Payer: Self-pay

## 2012-12-13 ENCOUNTER — Encounter (HOSPITAL_COMMUNITY): Payer: Self-pay

## 2012-12-13 ENCOUNTER — Encounter (HOSPITAL_COMMUNITY)
Admission: RE | Disposition: A | Discharge status: Home / Self Care | Payer: Self-pay | Source: Ambulatory Visit | Attending: Gastroenterology

## 2012-12-13 ENCOUNTER — Ambulatory Visit (HOSPITAL_COMMUNITY)
Admission: RE | Admit: 2012-12-13 | Discharge: 2012-12-13 | Disposition: A | Discharge status: Home / Self Care | Payer: Medicare Other | Source: Ambulatory Visit | Attending: Gastroenterology | Admitting: Gastroenterology

## 2012-12-13 DIAGNOSIS — K5730 Diverticulosis of large intestine without perforation or abscess without bleeding: Secondary | ICD-10-CM | POA: Insufficient documentation

## 2012-12-13 DIAGNOSIS — Z860100 Personal history of colon polyps, unspecified: Secondary | ICD-10-CM | POA: Insufficient documentation

## 2012-12-13 DIAGNOSIS — Z1211 Encounter for screening for malignant neoplasm of colon: Secondary | ICD-10-CM | POA: Diagnosis not present

## 2012-12-13 DIAGNOSIS — K648 Other hemorrhoids: Secondary | ICD-10-CM | POA: Diagnosis not present

## 2012-12-13 DIAGNOSIS — Z09 Encounter for follow-up examination after completed treatment for conditions other than malignant neoplasm: Secondary | ICD-10-CM | POA: Diagnosis not present

## 2012-12-13 HISTORY — PX: COLONOSCOPY: SHX5424

## 2012-12-13 SURGERY — COLONOSCOPY
Anesthesia: Moderate Sedation

## 2012-12-13 MED ORDER — DIPHENHYDRAMINE HCL 50 MG/ML IJ SOLN
INTRAMUSCULAR | Status: AC
  Filled 2012-12-13: qty 1

## 2012-12-13 MED ORDER — FENTANYL CITRATE 0.05 MG/ML IJ SOLN
INTRAMUSCULAR | Status: DC | PRN
  Administered 2012-12-13 (×2): 25 ug via INTRAVENOUS

## 2012-12-13 MED ORDER — FENTANYL CITRATE 0.05 MG/ML IJ SOLN
INTRAMUSCULAR | Status: AC
  Filled 2012-12-13: qty 2

## 2012-12-13 MED ORDER — MIDAZOLAM HCL 10 MG/2ML IJ SOLN
INTRAMUSCULAR | Status: DC | PRN
  Administered 2012-12-13 (×2): 2 mg via INTRAVENOUS

## 2012-12-13 MED ORDER — SODIUM CHLORIDE 0.9 % IV SOLN
INTRAVENOUS | Status: DC
  Administered 2012-12-13: 500 mL via INTRAVENOUS

## 2012-12-13 MED ORDER — MIDAZOLAM HCL 10 MG/2ML IJ SOLN
INTRAMUSCULAR | Status: AC
  Filled 2012-12-13: qty 2

## 2012-12-13 NOTE — Op Note (Signed)
[facility] [address] [place], [address]   OPERATIVE PROCEDURE REPORT  PATIENT: Rebecca Rich, Rebecca Rich  MR#: [id number] BIRTHDATE: [birth year] GENDER: Female ENDOSCOPIST:  Lorenza Burton, MD ASSISTANT:   Tomma Rakers, RN, BSN Dorisann Frames, technician PROCEDURE DATE: 12/13/2012 PRE-PROCEDURE PREPARATION: The patient was prepped with a gallon of Golytely the night prior to the procedure.  The patient was fasted for 8 hours prior to the procedure.  PRE-PROCEDURE PHYSICAL: Patient has stable vital signs.  Neck is supple.  There is no JVD, thyromegaly or LAD.  Chest clear to auscultation.  S1 and S2 regular.  Abdomen soft, morbidly obese, non-distended, non-tender with NABS. PROCEDURE:     Colonoscopy, diagnostic ASA CLASS:     Class IV INDICATIONS:     1.  Personal histoy of colonic polyps-tubular adenoma  2. Colorectal cancer screening. MEDICATIONS:     Fentanyl 50 mcg  and Versed 4 mg IV .  DESCRIPTION OF PROCEDURE:   After the risks, benefits, and alternatives of the procedure were thoroughly explained [including a 10% missed rate of cancer and polyps], informed consent was obtained.  Digital rectal exam was performed.  The Pentax Colonoscope [id number]  was introduced through the anus  and advanced to the cecum, which was identified by both the appendix and ileocecal valve , limited by No adverse events experienced.   The quality of the prep was adequate. . Multiple washes were done. Small lesions could be missed. The instrument was then slowly withdrawn as the colon was fully examined.     COLON FINDINGS: Mild diverticulosis was noted in the sigmoid colon. The rest of the entire colonic mucosa appeared healthy with a normal vascular pattern. No masses, polyps or AVMs were noted. The appendiceal orifice and the ICV were identified and photographed. Retroflexed views revealed small non-bleeding internal hemorrhoids. The patient tolerated the procedure  without immediate complications.  The scope was then withdrawn from the patient and the procedure terminated.  TIME TO CECUM:  10 minutes 00 seconds WITHDRAW TIME:  06 minutes 00 seconds  IMPRESSION:     1.  Mild diverticulosis was noted in the sigmoid colon. 2.  Small internal hemorrhoids. 3. Otherwise, normal exam upto the cecum.  RECOMMENDATIONS:     1.  Continue surveillance. 2.  High fiber diet with liberal fluid intake. 3.  OP follow-up is advised on a PRN basis.   REPEAT EXAM:      In 5 years  for a repeat colonoscopy.  If the patient has any abnormal GI symptoms in the interim, she have been advised to contact the office as soon as possible for further recommendations.   CPT CODES:     [id number]  DIAGNOSIS CODES:     562.10, V12.72, V76.51  REFERRED NF:AOZHY Lonie Peak, M.D.  eSigned:  Dr. Lorenza Burton, MD 12/13/2012 4:33 PM   PATIENT NAME:  Rebecca Rich, Rebecca Rich MR#: [id number]

## 2012-12-13 NOTE — H&P (Signed)
Rebecca Rich is an 67 y.o. female.   Chief Complaint: Colorectal cancer screening. HPI: 67 year old white female, here for a screening colonoscopy. She has had a tubular adenoma removed in 2009. For further details see office notes.  Past Medical History  Diagnosis Date  . DIABETES MELLITUS, TYPE II 12/08/2006  . HYPERLIPIDEMIA 12/08/2006  . OBESITY 09/24/2009  . Morbid obesity 12/08/2006  . OBSTRUCTIVE SLEEP APNEA 12/08/2006  . HYPERTENSION 12/08/2006  . Acute bronchospasm 08/24/2009  . INFECTION, SKIN AND SOFT TISSUE 07/28/2008  . 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 OSTEOARTHRITIS 12/08/2006  . SHOULDER PAIN, RIGHT 02/07/2008  . Swelling of limb 07/28/2008  . CARDIAC MURMUR 12/08/2006  . RUQ PAIN 06/16/2008  . THYROID FUNCTION TEST, ABNORMAL 12/08/2006  . LIVER FUNCTION TESTS, ABNORMAL 07/28/2008  . COLONIC POLYPS, HX OF 12/08/2006  . 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 Gallbladder/common duct stone, without infection, with obstruction 03/01/2010    removed ercp  . Acute sphenoidal sinusitis 02/26/2010    Qualifier: Diagnosis of  By: Fabian Sharp MD, Neta Mends    Past Surgical History  Procedure Laterality Date  . Abdominal hysterectomy    . Cholecystectomy  06/27/2008  . Common duct stone      ERCP    Family History  Problem Relation Age of Onset  . Other Mother     blood clots  . Heart disease Brother   . Heart disease Sister   . Heart disease Brother   . Heart disease Brother   . Stroke Sister   . Throat cancer Father   . Cervical cancer Mother   . Diabetes Other     all siblings, 4 brothers, 5 sisters   Social History:  reports that she has never smoked. She does not have any smokeless tobacco history on file. She reports that she does not drink alcohol or use illicit drugs.  Allergies: No Known Allergies  Medications Prior to Admission  Medication Sig Dispense Refill  . calcium-vitamin D 185 MG TABS Take 185 mg by mouth daily.        . chlorthalidone (HYGROTON) 25 MG tablet Take 1 tablet  (25 mg total) by mouth daily.  90 tablet  1  . cholecalciferol (VITAMIN D) 1000 UNITS tablet Take 1,000 Units by mouth daily.      Marland Kitchen levothyroxine (SYNTHROID, LEVOTHROID) 25 MCG tablet take 1 tablet by mouth once daily  30 tablet  2  . Multiple Vitamins-Minerals (CENTRUM SILVER PO) Take 1 tablet by mouth daily.        Marland Kitchen OVER THE COUNTER MEDICATION Herbal life      . potassium chloride (K-DUR,KLOR-CON) 10 MEQ tablet Take 2 tablets (20 mEq total) by mouth daily.  60 tablet  10  . ramipril (ALTACE) 10 MG capsule Take 2 capsules (20 mg total) by mouth daily.  180 capsule  3    No results found for this or any previous visit (from the past 48 hour(s)). No results found.  Review of Systems  Constitutional: Negative.   Eyes: Negative.   Gastrointestinal: Positive for diarrhea. Negative for heartburn, nausea, vomiting, abdominal pain, constipation, blood in stool and melena.  Musculoskeletal: Positive for joint pain.  Psychiatric/Behavioral: Negative.     There were no vitals taken for this visit. Physical Exam  Constitutional: She appears well-developed and well-nourished.  Morbidly obese  Neck: Normal range of motion. Neck supple.  Cardiovascular: Normal rate and regular rhythm.   Respiratory: Effort normal.  GI: Soft. Normal appearance. She exhibits no  shifting dullness, no distension and no fluid wave.  Psychiatric: She has a normal mood and affect.    Assessment/Plan Colorectal cancer screening: proceed with a colonoscopy at this time.  Duanne Duchesne 12/13/2012, 3:13 PM

## 2012-12-14 ENCOUNTER — Encounter (HOSPITAL_COMMUNITY): Payer: Self-pay | Admitting: Gastroenterology

## 2012-12-14 ENCOUNTER — Other Ambulatory Visit (INDEPENDENT_AMBULATORY_CARE_PROVIDER_SITE_OTHER): Payer: Medicare Other

## 2012-12-14 DIAGNOSIS — E1110 Type 2 diabetes mellitus with ketoacidosis without coma: Secondary | ICD-10-CM | POA: Diagnosis not present

## 2012-12-21 ENCOUNTER — Encounter: Payer: Self-pay | Admitting: Internal Medicine

## 2012-12-21 ENCOUNTER — Ambulatory Visit (INDEPENDENT_AMBULATORY_CARE_PROVIDER_SITE_OTHER): Payer: Medicare Other | Admitting: Internal Medicine

## 2012-12-21 VITALS — BP 120/50 | Temp 98.2°F | Wt 272.0 lb

## 2012-12-21 DIAGNOSIS — E119 Type 2 diabetes mellitus without complications: Secondary | ICD-10-CM | POA: Diagnosis not present

## 2012-12-21 DIAGNOSIS — I10 Essential (primary) hypertension: Secondary | ICD-10-CM

## 2012-12-21 MED ORDER — POTASSIUM CHLORIDE CRYS ER 10 MEQ PO TBCR: 20.0000 meq | EXTENDED_RELEASE_TABLET | Freq: Every day | ORAL | Status: DC

## 2012-12-21 MED ORDER — CHLORTHALIDONE 25 MG PO TABS: 25.0000 mg | ORAL_TABLET | Freq: Every day | ORAL | Status: DC

## 2012-12-21 NOTE — Progress Notes (Signed)
Chief Complaint  Patient presents with  . Follow-up    HPI: Patient comes in today for follow up of  multiple medical problems.  BP  About 139 range at home   Down  But not to 120.  No se of med  Can bend over without head pressure no dizziness of syncope. Had colon and on 5 year recall Traveling this holiday .  No vision neuro sx  ROS: See pertinent positives and negatives per HPI.  Past Medical History  Diagnosis Date  . DIABETES MELLITUS, TYPE II 12/08/2006  . HYPERLIPIDEMIA 12/08/2006  . OBESITY 09/24/2009  . Morbid obesity 12/08/2006  . OBSTRUCTIVE SLEEP APNEA 12/08/2006  . HYPERTENSION 12/08/2006  . Acute bronchospasm 08/24/2009  . INFECTION, SKIN AND SOFT TISSUE 07/28/2008  . 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 OSTEOARTHRITIS 12/08/2006  . SHOULDER PAIN, RIGHT 02/07/2008  . Swelling of limb 07/28/2008  . CARDIAC MURMUR 12/08/2006  . RUQ PAIN 06/16/2008  . THYROID FUNCTION TEST, ABNORMAL 12/08/2006  . LIVER FUNCTION TESTS, ABNORMAL 07/28/2008  . COLONIC POLYPS, HX OF 12/08/2006  . 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 Gallbladder/common duct stone, without infection, with obstruction 03/01/2010    removed ercp  . Acute sphenoidal sinusitis 02/26/2010    Qualifier: Diagnosis of  By: Fabian Sharp MD, Neta Mends     Family History  Problem Relation Age of Onset  . Other Mother     blood clots  . Heart disease Brother   . Heart disease Sister   . Heart disease Brother   . Heart disease Brother   . Stroke Sister   . Throat cancer Father   . Cervical cancer Mother   . Diabetes Other     all siblings, 4 brothers, 5 sisters    History   Social History  . Marital Status: Single    Spouse Name: N/A    Number of Children: N/A  . Years of Education: N/A   Occupational History  . retired    Social History Main Topics  . Smoking status: Never Smoker   . Smokeless tobacco: None  . Alcohol Use: No  . Drug Use: No  . Sexual Activity: None   Other Topics Concern  . None   Social History  Narrative   Master level education in math   Pt is currently retired   Pt is divorced with children   Recently had to move had a break in and thus away from her pool exercise    Outpatient Encounter Prescriptions as of 12/21/2012  Medication Sig  . calcium-vitamin D 185 MG TABS Take 185 mg by mouth daily.    . chlorthalidone (HYGROTON) 25 MG tablet Take 1 tablet (25 mg total) by mouth daily.  . cholecalciferol (VITAMIN D) 1000 UNITS tablet Take 1,000 Units by mouth daily.  Marland Kitchen levothyroxine (SYNTHROID, LEVOTHROID) 25 MCG tablet take 1 tablet by mouth once daily  . Multiple Vitamins-Minerals (CENTRUM SILVER PO) Take 1 tablet by mouth daily.    Marland Kitchen OVER THE COUNTER MEDICATION Herbal life  . potassium chloride (K-DUR,KLOR-CON) 10 MEQ tablet Take 2 tablets (20 mEq total) by mouth daily.  . ramipril (ALTACE) 10 MG capsule Take 2 capsules (20 mg total) by mouth daily.  . [DISCONTINUED] chlorthalidone (HYGROTON) 25 MG tablet Take 1 tablet (25 mg total) by mouth daily.  . [DISCONTINUED] potassium chloride (K-DUR,KLOR-CON) 10 MEQ tablet Take 2 tablets (20 mEq total) by mouth daily.    EXAM:  BP 120/50  Temp(Src) 98.2 F (36.8 C) (Oral)  Wt 272 lb (123.378 kg)  Body mass index is 51.42 kg/(m^2).  GENERAL: vitals reviewed and listed above, alert, oriented, appears well hydrated and in no acute distress bp repreat normal  Labs reviewed nl cbc from dr Loreta Ave and nl lfts.  PSYCH: pleasant and cooperative, no obvious depression or anxiety Lab Results  Component Value Date   WBC 5.4 02/23/2012   HGB 12.8 02/23/2012   HCT 38.0 02/23/2012   PLT 140* 02/23/2012   GLUCOSE 112* 11/16/2012   CHOL 136 09/08/2012   TRIG 120.0 09/08/2012   HDL 51.60 09/08/2012   LDLCALC 60 09/08/2012   ALT 18 09/25/2011   AST 18 09/25/2011   NA 140 11/16/2012   K 3.8 11/16/2012   CL 103 11/16/2012   CREATININE 0.8 11/16/2012   BUN 22 11/16/2012   CO2 29 11/16/2012   TSH 3.36 01/06/2012   HGBA1C 6.4 12/14/2012    MICROALBUR 0.4 08/02/2009   Wt Readings from Last 3 Encounters:  12/21/12 272 lb (123.378 kg)  09/13/12 270 lb (122.471 kg)  07/05/12 266 lb (120.657 kg)    ASSESSMENT AND PLAN:  Discussed the following assessment and plan:  Unspecified essential hypertension - controilled  potass ok continue  Type II or unspecified type diabetes mellitus without mention of complication, not stated as uncontrolled - lsi continue  wieht loss advised   Morbid obesity Counseled. Strategies  -Patient advised to return or notify health care team  if symptoms worsen or persist or new concerns arise.  Patient Instructions  bp is better   Weight loss  5-10 pounds should help also.  ROV in February.  BMP and hg a1c   TSHIn 3 months   Neta Mends. Taiana Temkin M.D.  Pre visit review using our clinic review tool, if applicable. No additional management support is needed unless otherwise documented below in the visit note.

## 2012-12-21 NOTE — Patient Instructions (Addendum)
bp is better   Weight loss  5-10 pounds should help also.  ROV in February.  BMP and hg a1c   TSHIn 3 months

## 2013-01-10 ENCOUNTER — Other Ambulatory Visit: Payer: Self-pay | Admitting: Internal Medicine

## 2013-03-15 ENCOUNTER — Other Ambulatory Visit: Payer: Medicare Other

## 2013-03-22 ENCOUNTER — Ambulatory Visit (INDEPENDENT_AMBULATORY_CARE_PROVIDER_SITE_OTHER): Payer: Medicare Other | Admitting: Internal Medicine

## 2013-03-22 ENCOUNTER — Encounter: Payer: Self-pay | Admitting: Internal Medicine

## 2013-03-22 VITALS — BP 122/52 | HR 75 | Ht 61.0 in | Wt 278.0 lb

## 2013-03-22 DIAGNOSIS — E119 Type 2 diabetes mellitus without complications: Secondary | ICD-10-CM

## 2013-03-22 DIAGNOSIS — I10 Essential (primary) hypertension: Secondary | ICD-10-CM

## 2013-03-22 DIAGNOSIS — E039 Hypothyroidism, unspecified: Secondary | ICD-10-CM | POA: Diagnosis not present

## 2013-03-22 DIAGNOSIS — M62838 Other muscle spasm: Secondary | ICD-10-CM

## 2013-03-22 LAB — BASIC METABOLIC PANEL
BUN: 23 mg/dL (ref 6–23)
CALCIUM: 9.4 mg/dL (ref 8.4–10.5)
CO2: 26 mEq/L (ref 19–32)
Chloride: 107 mEq/L (ref 96–112)
Creatinine, Ser: 0.9 mg/dL (ref 0.4–1.2)
GFR: 76.66 mL/min (ref >60.00)
Glucose, Bld: 101 mg/dL — ABNORMAL HIGH (ref 70–99)
Potassium: 3.7 mEq/L (ref 3.5–5.1)
SODIUM: 140 meq/L (ref 135–145)

## 2013-03-22 LAB — TSH: TSH: 3.07 u[IU]/mL (ref 0.35–5.50)

## 2013-03-22 LAB — MAGNESIUM: MAGNESIUM: 2 mg/dL (ref 1.5–2.5)

## 2013-03-22 LAB — HEMOGLOBIN A1C: HEMOGLOBIN A1C: 6.5 % (ref 4.6–6.5)

## 2013-03-22 NOTE — Progress Notes (Signed)
Chief Complaint  Patient presents with  . Follow-up    pt has not had labs drawn due to inclement weather last week. She has not received a call to reschedule    HPI: Patient comes in today for follow up of  multiple medical problems.  Sometimes arthritis is an issue  ocass righ back spasm to leg but better is repositions self no weakness of numbness  DM  Check sugar now and then  Avoiding sugar  And no whtie bread  Maintaining so far  THYRoid.  No missed doses  bp no missed meds for this   ROS: See pertinent positives and negatives per HPI. Still traveling  Past Medical History  Diagnosis Date  . DIABETES MELLITUS, TYPE II 12/08/2006  . HYPERLIPIDEMIA 12/08/2006  . OBESITY 09/24/2009  . Morbid obesity 12/08/2006  . OBSTRUCTIVE SLEEP APNEA 12/08/2006  . HYPERTENSION 12/08/2006  . Acute bronchospasm 08/24/2009  . INFECTION, SKIN AND SOFT TISSUE 07/28/2008  . 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 OSTEOARTHRITIS 12/08/2006  . SHOULDER PAIN, RIGHT 02/07/2008  . Swelling of limb 07/28/2008  . CARDIAC MURMUR 12/08/2006  . RUQ PAIN 06/16/2008  . THYROID FUNCTION TEST, ABNORMAL 12/08/2006  . LIVER FUNCTION TESTS, ABNORMAL 07/28/2008  . COLONIC POLYPS, HX OF 12/08/2006  . 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 Gallbladder/common duct stone, without infection, with obstruction 03/01/2010    removed ercp  . Acute sphenoidal sinusitis 02/26/2010    Qualifier: Diagnosis of  By: Regis Bill MD, Standley Brooking     Family History  Problem Relation Age of Onset  . Other Mother     blood clots  . Heart disease Brother   . Heart disease Sister   . Heart disease Brother   . Heart disease Brother   . Stroke Sister   . Throat cancer Father   . Cervical cancer Mother   . Diabetes Other     all siblings, 4 brothers, 5 sisters    History   Social History  . Marital Status: Single    Spouse Name: N/A    Number of Children: N/A  . Years of Education: N/A   Occupational History  . retired    Social History Main Topics    . Smoking status: Never Smoker   . Smokeless tobacco: None  . Alcohol Use: No  . Drug Use: No  . Sexual Activity: None   Other Topics Concern  . None   Social History Narrative   Master level education in math   Pt is currently retired   Pt is divorced with children   Recently had to move had a break in and thus away from her pool exercise    Outpatient Encounter Prescriptions as of 03/22/2013  Medication Sig  . chlorthalidone (HYGROTON) 25 MG tablet Take 1 tablet (25 mg total) by mouth daily.  . cholecalciferol (VITAMIN D) 1000 UNITS tablet Take 1,000 Units by mouth daily.  Marland Kitchen levothyroxine (SYNTHROID, LEVOTHROID) 25 MCG tablet take 1 tablet by mouth once daily  . Multiple Vitamins-Minerals (CENTRUM SILVER PO) Take 1 tablet by mouth daily.    Marland Kitchen OVER THE COUNTER MEDICATION Osteo Bi-Flex  . potassium chloride (K-DUR,KLOR-CON) 10 MEQ tablet Take 2 tablets (20 mEq total) by mouth daily.  . ramipril (ALTACE) 10 MG capsule Take 2 capsules (20 mg total) by mouth daily.  . [DISCONTINUED] calcium-vitamin D 185 MG TABS Take 185 mg by mouth daily.    . [DISCONTINUED] ramipril (ALTACE) 10 MG capsule take 1 capsule by mouth twice  a day    EXAM:  BP 122/52  Pulse 75  Ht 5\' 1"  (1.549 m)  Wt 278 lb (126.1 kg)  BMI 52.55 kg/m2  Body mass index is 52.55 kg/(m^2). Wt Readings from Last 3 Encounters:  03/22/13 278 lb (126.1 kg)  12/21/12 272 lb (123.378 kg)  09/13/12 270 lb (122.471 kg)    GENERAL: vitals reviewed and listed above, alert, oriented, appears well hydrated and in no acute distress HEENT: atraumatic, conjunctiva  clear, no obvious abnormalities on inspection of external nose and ears  NECK: no obvious masses on inspection palpation  LUNGS: clear to auscultation bilaterally, no wheezes, rales or rhonchi, good air movement CV: HRRR, no clubbing cyanosis  nl cap refill  MS: moves all extremities without noticeable focal  abnormality  PSYCH: pleasant and cooperative, no  obvious depression or anxiety Lab Results  Component Value Date   WBC 5.4 02/23/2012   HGB 12.8 02/23/2012   HCT 38.0 02/23/2012   PLT 140* 02/23/2012   GLUCOSE 112* 11/16/2012   CHOL 136 09/08/2012   TRIG 120.0 09/08/2012   HDL 51.60 09/08/2012   LDLCALC 60 09/08/2012   ALT 18 09/25/2011   AST 18 09/25/2011   NA 140 11/16/2012   K 3.8 11/16/2012   CL 103 11/16/2012   CREATININE 0.8 11/16/2012   BUN 22 11/16/2012   CO2 29 11/16/2012   TSH 3.36 01/06/2012   HGBA1C 6.4 12/14/2012   MICROALBUR 0.4 08/02/2009    ASSESSMENT AND PLAN:  Discussed the following assessment and plan:  Type II or unspecified type diabetes mellitus without mention of complication, not stated as uncontrolled - Plan: TSH, Hemoglobin R4E, Basic metabolic panel, Magnesium  Unspecified essential hypertension - Plan: TSH, Hemoglobin R1V, Basic metabolic panel, Magnesium  Hypothyroid - Plan: TSH, Hemoglobin Q0G, Basic metabolic panel, Magnesium  Morbid obesity - Plan: TSH, Hemoglobin Q6P, Basic metabolic panel, Magnesium  Muscle spasm - Plan: TSH, Hemoglobin Y1P, Basic metabolic panel, Magnesium consdier prevnar 13   When due pt wants to avoid shots .  -Patient advised to return or notify health care team  if symptoms worsen or persist or new concerns arise.  Patient Instructions  Intensify lifestyle interventions. Weight loss will help risk of muscle spasm, Blood pressure is good today. Will notify you  of labs when available. ROV in  about 4 -5 months and labs pre visit .    Standley Brooking. Panosh M.D.   Total visit 37mins > 50% spent counseling and coordinating care

## 2013-03-22 NOTE — Progress Notes (Signed)
Pre visit review using our clinic review tool, if applicable. No additional management support is needed unless otherwise documented below in the visit note. 

## 2013-03-22 NOTE — Patient Instructions (Addendum)
Intensify lifestyle interventions. Weight loss will help risk of muscle spasm, Blood pressure is good today. Will notify you  of labs when available. ROV in  about 4 -5 months and labs pre visit .

## 2013-03-23 ENCOUNTER — Telehealth: Payer: Self-pay

## 2013-03-23 ENCOUNTER — Telehealth: Payer: Self-pay | Admitting: Internal Medicine

## 2013-03-23 NOTE — Telephone Encounter (Signed)
Relevant patient education assigned to patient using Emmi. ° °

## 2013-04-26 ENCOUNTER — Other Ambulatory Visit: Payer: Self-pay | Admitting: Internal Medicine

## 2013-05-02 ENCOUNTER — Other Ambulatory Visit: Payer: Self-pay | Admitting: Internal Medicine

## 2013-05-10 DIAGNOSIS — H35059 Retinal neovascularization, unspecified, unspecified eye: Secondary | ICD-10-CM | POA: Diagnosis not present

## 2013-05-10 DIAGNOSIS — E119 Type 2 diabetes mellitus without complications: Secondary | ICD-10-CM | POA: Diagnosis not present

## 2013-05-10 LAB — HM DIABETES EYE EXAM

## 2013-05-12 ENCOUNTER — Encounter: Payer: Self-pay | Admitting: Internal Medicine

## 2013-05-16 ENCOUNTER — Encounter: Payer: Self-pay | Admitting: Internal Medicine

## 2013-06-07 ENCOUNTER — Ambulatory Visit: Payer: Medicare Other | Admitting: Pulmonary Disease

## 2013-06-15 ENCOUNTER — Encounter: Payer: Self-pay | Admitting: Pulmonary Disease

## 2013-06-15 ENCOUNTER — Ambulatory Visit (INDEPENDENT_AMBULATORY_CARE_PROVIDER_SITE_OTHER): Payer: Medicare Other | Admitting: Pulmonary Disease

## 2013-06-15 VITALS — BP 130/72 | HR 77 | Temp 98.6°F | Ht 61.0 in | Wt 284.0 lb

## 2013-06-15 DIAGNOSIS — G4733 Obstructive sleep apnea (adult) (pediatric): Secondary | ICD-10-CM | POA: Diagnosis not present

## 2013-06-15 NOTE — Assessment & Plan Note (Signed)
The patient continues to do well with CPAP, and her downloaded shows adequate compliance and good control of her AHI. I've asked her to continue on her device, and to work more aggressively on weight loss. I will see her back in one year if doing well.

## 2013-06-15 NOTE — Patient Instructions (Signed)
Continue with cpap, and keep up with mask changes and supplies. Work on weight loss followup with me again in one year if doing well.  

## 2013-06-15 NOTE — Progress Notes (Signed)
   Subjective:    Patient ID: Rebecca Rich, female    DOB: [birth year], 68 y.o.   MRN: [id number]  HPI Patient comes in today for followup of her obstructive sleep apnea. She is wearing CPAP compliantly, and her downloaded shows excellent control of her obstructive events. She feels that she is sleeping well with the device, but occasionally feels that she is not getting enough air. She denies any significant mask issues, and feels adequately rested the following day. Of note, her weight is up 10 pounds from the last visit.   Review of Systems  Constitutional: Negative for fever and unexpected weight change.  HENT: Negative for congestion, dental problem, ear pain, nosebleeds, postnasal drip, rhinorrhea, sinus pressure, sneezing, sore throat and trouble swallowing.   Eyes: Negative for redness and itching.  Respiratory: Negative for cough, chest tightness, shortness of breath and wheezing.   Cardiovascular: Negative for palpitations and leg swelling.  Gastrointestinal: Negative for nausea and vomiting.  Genitourinary: Negative for dysuria.  Musculoskeletal: Negative for joint swelling.  Skin: Negative for rash.  Neurological: Negative for headaches.  Hematological: Does not bruise/bleed easily.  Psychiatric/Behavioral: Negative for dysphoric mood. The patient is not nervous/anxious.        Objective:   Physical Exam Obese female in no acute distress Nose without purulence or discharge noted No skin breakdown or pressure necrosis from the CPAP Neck without lymphadenopathy or thyromegaly Lower extremities with edema noted, no cyanosis Alert and oriented, moves all 4 extremities.       Assessment & Plan:

## 2013-08-09 ENCOUNTER — Other Ambulatory Visit (INDEPENDENT_AMBULATORY_CARE_PROVIDER_SITE_OTHER): Payer: Medicare Other

## 2013-08-09 ENCOUNTER — Other Ambulatory Visit: Payer: Self-pay | Admitting: Internal Medicine

## 2013-08-09 DIAGNOSIS — E1110 Type 2 diabetes mellitus with ketoacidosis without coma: Secondary | ICD-10-CM

## 2013-08-09 DIAGNOSIS — E785 Hyperlipidemia, unspecified: Secondary | ICD-10-CM | POA: Diagnosis not present

## 2013-08-09 LAB — LIPID PANEL
CHOLESTEROL: 132 mg/dL (ref 0–200)
HDL: 53.2 mg/dL (ref >39.00)
LDL CALC: 59 mg/dL (ref 0–99)
NonHDL: 78.8
Total CHOL/HDL Ratio: 2
Triglycerides: 100 mg/dL (ref 0.0–149.0)
VLDL: 20 mg/dL (ref 0.0–40.0)

## 2013-08-09 LAB — HEMOGLOBIN A1C: HEMOGLOBIN A1C: 6.6 % — AB (ref 4.6–6.5)

## 2013-08-09 NOTE — Telephone Encounter (Signed)
Filled for 90 days.  Pt has upcoming appt on 08/16/13

## 2013-08-16 ENCOUNTER — Ambulatory Visit (INDEPENDENT_AMBULATORY_CARE_PROVIDER_SITE_OTHER): Payer: Medicare Other | Admitting: Internal Medicine

## 2013-08-16 ENCOUNTER — Encounter: Payer: Self-pay | Admitting: Internal Medicine

## 2013-08-16 VITALS — BP 130/60 | Temp 98.3°F | Ht 61.0 in | Wt 286.0 lb

## 2013-08-16 DIAGNOSIS — E782 Mixed hyperlipidemia: Secondary | ICD-10-CM

## 2013-08-16 DIAGNOSIS — E119 Type 2 diabetes mellitus without complications: Secondary | ICD-10-CM | POA: Diagnosis not present

## 2013-08-16 DIAGNOSIS — I10 Essential (primary) hypertension: Secondary | ICD-10-CM | POA: Diagnosis not present

## 2013-08-16 NOTE — Progress Notes (Signed)
Pre visit review using our clinic review tool, if applicable. No additional management support is needed unless otherwise documented below in the visit note.  Chief Complaint  Patient presents with  . Follow-up  . Diabetes  . Hypertension    HPI: Fu of medical issues  DM  Gained weight when hip right started hurting in acting out now better no injury no medicine BP   Readings  143/67 she is taking her diuretic and ramipril twice a day. Had hip  Problem pain now an ache. ? After sleeping in hotel bed.  and teeth gum  problem on right side   . Continues to travel and has 3 trips planned in the next 4 months. Weight has gone up some she wants to get it down to below 270. ROS: See pertinent positives and negatives per HPI. No cp so . No new numbness vision change fall. No unusual muscle cramps. He is using her CPAP.  Past Medical History  Diagnosis Date  . DIABETES MELLITUS, TYPE II 12/08/2006  . HYPERLIPIDEMIA 12/08/2006  . OBESITY 09/24/2009  . Morbid obesity 12/08/2006  . OBSTRUCTIVE SLEEP APNEA 12/08/2006  . HYPERTENSION 12/08/2006  . Acute bronchospasm 08/24/2009  . INFECTION, SKIN AND SOFT TISSUE 07/28/2008  . 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 OSTEOARTHRITIS 12/08/2006  . SHOULDER PAIN, RIGHT 02/07/2008  . Swelling of limb 07/28/2008  . CARDIAC MURMUR 12/08/2006  . RUQ PAIN 06/16/2008  . THYROID FUNCTION TEST, ABNORMAL 12/08/2006  . LIVER FUNCTION TESTS, ABNORMAL 07/28/2008  . COLONIC POLYPS, HX OF 12/08/2006  . 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 Gallbladder/common duct stone, without infection, with obstruction 03/01/2010    removed ercp  . Acute sphenoidal sinusitis 02/26/2010    Qualifier: Diagnosis of  By: Regis Bill MD, Standley Brooking     Family History  Problem Relation Age of Onset  . Other Mother     blood clots  . Heart disease Brother   . Heart disease Sister   . Heart disease Brother   . Heart disease Brother   . Stroke Sister   . Throat cancer Father   . Cervical cancer Mother   .  Diabetes Other     all siblings, 4 brothers, 5 sisters    History   Social History  . Marital Status: Single    Spouse Name: N/A    Number of Children: N/A  . Years of Education: N/A   Occupational History  . retired    Social History Main Topics  . Smoking status: Never Smoker   . Smokeless tobacco: None  . Alcohol Use: No  . Drug Use: No  . Sexual Activity: None   Other Topics Concern  . None   Social History Narrative   Master level education in math   Pt is currently retired   Pt is divorced with children   Recently had to move had a break in and thus away from her pool exercise    Outpatient Encounter Prescriptions as of 08/16/2013  Medication Sig  . chlorthalidone (HYGROTON) 25 MG tablet Take 1 tablet (25 mg total) by mouth daily.  . cholecalciferol (VITAMIN D) 1000 UNITS tablet Take 1,000 Units by mouth daily.  Marland Kitchen levothyroxine (SYNTHROID, LEVOTHROID) 25 MCG tablet take 1 tablet by mouth once daily  . Multiple Vitamins-Minerals (CENTRUM SILVER PO) Take 1 tablet by mouth daily.    Marland Kitchen OVER THE COUNTER MEDICATION Osteo Bi-Flex  . potassium chloride (K-DUR,KLOR-CON) 10 MEQ tablet Take 2 tablets (20 mEq total) by mouth daily.  Marland Kitchen  ramipril (ALTACE) 10 MG capsule Take 2 capsules (20 mg total) by mouth daily.  . [DISCONTINUED] ramipril (ALTACE) 10 MG capsule take 1 capsule by mouth twice a day    EXAM:  BP 130/60  Temp(Src) 98.3 F (36.8 C) (Oral)  Ht 5\' 1"  (1.549 m)  Wt 286 lb (129.729 kg)  BMI 54.07 kg/m2  Body mass index is 54.07 kg/(m^2).  GENERAL: vitals reviewed and listed above, alert, oriented, appears well hydrated and in no acute distress CV: HRRR, no clubbing cyanosis or  peripheral edema nl cap refill  MS: moves all extremities without noticeable focal  Abnormality gait a bit antalgic  No point tenderness   PSYCH: pleasant and cooperative, no obvious depression or anxiety Lab Results  Component Value Date   WBC 5.4 02/23/2012   HGB 12.8 02/23/2012    HCT 38.0 02/23/2012   PLT 140* 02/23/2012   GLUCOSE 101* 03/22/2013   CHOL 132 08/09/2013   TRIG 100.0 08/09/2013   HDL 53.20 08/09/2013   LDLCALC 59 08/09/2013   ALT 18 09/25/2011   AST 18 09/25/2011   NA 140 03/22/2013   K 3.7 03/22/2013   CL 107 03/22/2013   CREATININE 0.9 03/22/2013   BUN 23 03/22/2013   CO2 26 03/22/2013   TSH 3.07 03/22/2013   HGBA1C 6.6* 08/09/2013   MICROALBUR 0.4 08/02/2009   Wt Readings from Last 3 Encounters:  08/16/13 286 lb (129.729 kg)  06/15/13 284 lb (128.822 kg)  03/22/13 278 lb (126.1 kg)     ASSESSMENT AND PLAN:  Discussed the following assessment and plan:  DIABETES MELLITUS, TYPE II - no complications at this time does not want to use medicine  Morbid obesity - disc counseld about  weight loss  Unspecified essential hypertension - 142 at home bettter after sitting has wrist machine  disc moniutoring doesnt want to add med  weight loss would help.  Mixed hyperlipidemia - at goal  BP Readings from Last 3 Encounters:  08/16/13 130/60  06/15/13 130/72  03/22/13 122/52   spent a good deal of time about encouragement to get back to the weight loss strategy blood pressure control doesn't line 1 to check her blood sugars nor add medication. We'll follow up in 3 to 4 months with hemoglobin A1c BMP to ensure control.  -Patient advised to return or notify health care team  if symptoms worsen ,persist or new concerns arise.  Patient Instructions  Dental health is important for  Heart health.  Intensify lifestyle interventions. To keep blood sugar in Control and  Blood pressure control Lose weight  And this will  help.  Take blood pressure readings twice a day for 7- 10 days and then periodically .To ensure below 140/90   .  Goal  Below #  270 is a good goal for 3 months     .     Standley Brooking. Dejean Tribby M.D.  Total visit 80mins > 50% spent counseling and coordinating care  Weight management dm etc

## 2013-08-16 NOTE — Patient Instructions (Addendum)
Dental health is important for  Heart health.  Intensify lifestyle interventions. To keep blood sugar in Control and  Blood pressure control Lose weight  And this will  help.  Take blood pressure readings twice a day for 7- 10 days and then periodically .To ensure below 140/90   .  Goal  Below #  270 is a good goal for 3 months     .

## 2013-08-19 ENCOUNTER — Other Ambulatory Visit: Payer: Self-pay | Admitting: Internal Medicine

## 2013-08-19 NOTE — Telephone Encounter (Signed)
Sent to the pharmacy by e-scribe. 

## 2013-11-16 ENCOUNTER — Other Ambulatory Visit: Payer: Self-pay | Admitting: Internal Medicine

## 2013-11-16 NOTE — Telephone Encounter (Signed)
Sent to the pharmacy by e-scribe.  Pt has appt on 11/29/13

## 2013-11-22 ENCOUNTER — Other Ambulatory Visit (INDEPENDENT_AMBULATORY_CARE_PROVIDER_SITE_OTHER): Payer: Medicare Other

## 2013-11-22 DIAGNOSIS — I10 Essential (primary) hypertension: Secondary | ICD-10-CM | POA: Diagnosis not present

## 2013-11-22 DIAGNOSIS — E119 Type 2 diabetes mellitus without complications: Secondary | ICD-10-CM

## 2013-11-22 LAB — BASIC METABOLIC PANEL
BUN: 17 mg/dL (ref 6–23)
CALCIUM: 9.2 mg/dL (ref 8.4–10.5)
CO2: 27 meq/L (ref 19–32)
Chloride: 103 mEq/L (ref 96–112)
Creatinine, Ser: 1 mg/dL (ref 0.4–1.2)
GFR: 74.66 mL/min (ref >60.00)
Glucose, Bld: 107 mg/dL — ABNORMAL HIGH (ref 70–99)
Potassium: 3.8 mEq/L (ref 3.5–5.1)
SODIUM: 137 meq/L (ref 135–145)

## 2013-11-22 LAB — HEMOGLOBIN A1C: Hgb A1c MFr Bld: 6.6 % — ABNORMAL HIGH (ref 4.6–6.5)

## 2013-11-24 ENCOUNTER — Ambulatory Visit: Payer: Medicare Other | Admitting: Internal Medicine

## 2013-11-29 ENCOUNTER — Encounter: Payer: Self-pay | Admitting: Internal Medicine

## 2013-11-29 ENCOUNTER — Encounter: Payer: Medicare Other | Admitting: Internal Medicine

## 2013-11-29 ENCOUNTER — Ambulatory Visit: Payer: Medicare Other | Admitting: Internal Medicine

## 2013-11-29 NOTE — Progress Notes (Signed)
Document opened and reviewed for OV but appt  canceled same day .  

## 2013-12-02 ENCOUNTER — Encounter: Payer: Self-pay | Admitting: Internal Medicine

## 2013-12-02 ENCOUNTER — Ambulatory Visit (INDEPENDENT_AMBULATORY_CARE_PROVIDER_SITE_OTHER): Payer: Medicare Other | Admitting: Internal Medicine

## 2013-12-02 VITALS — BP 136/50 | Temp 98.5°F | Ht 61.0 in | Wt 275.9 lb

## 2013-12-02 DIAGNOSIS — E119 Type 2 diabetes mellitus without complications: Secondary | ICD-10-CM

## 2013-12-02 DIAGNOSIS — I10 Essential (primary) hypertension: Secondary | ICD-10-CM

## 2013-12-02 NOTE — Patient Instructions (Signed)
Continue monitoring to control sugar .   Readings are acceptable but  Can be better as we know .  Add exercise as much as possible.   Wt Readings from Last 3 Encounters:  12/02/13 275 lb 14.4 oz (125.147 kg)  08/16/13 286 lb (129.729 kg)  06/15/13 284 lb (128.822 kg)

## 2013-12-02 NOTE — Progress Notes (Signed)
Pre visit review using our clinic review tool, if applicable. No additional management support is needed unless otherwise documented below in the visit note.  Chief Complaint  Patient presents with  . Follow-up  . Diabetes    HPI: Rebecca Rich for fu of Chronic disease management  DM    bg About 110 range  124   If diet indiscretion.  root canal.  Or taken out.  Has lost some weight but not exercising yet as in past still with water aerobics Bp: ok Tole she needs root canal or extraction  Cant afford root canal still retiring  No numbness weakness or low BG .  ROS: See pertinent positives and negatives per HPI.  Past Medical History  Diagnosis Date  . DIABETES MELLITUS, TYPE II 12/08/2006  . HYPERLIPIDEMIA 12/08/2006  . OBESITY 09/24/2009  . Morbid obesity 12/08/2006  . OBSTRUCTIVE SLEEP APNEA 12/08/2006  . HYPERTENSION 12/08/2006  . Acute bronchospasm 08/24/2009  . INFECTION, SKIN AND SOFT TISSUE 07/28/2008  . 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 OSTEOARTHRITIS 12/08/2006  . SHOULDER PAIN, RIGHT 02/07/2008  . Swelling of limb 07/28/2008  . CARDIAC MURMUR 12/08/2006  . RUQ PAIN 06/16/2008  . THYROID FUNCTION TEST, ABNORMAL 12/08/2006  . LIVER FUNCTION TESTS, ABNORMAL 07/28/2008  . COLONIC POLYPS, HX OF 12/08/2006  . 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 Gallbladder/common duct stone, without infection, with obstruction 03/01/2010    removed ercp  . Acute sphenoidal sinusitis 02/26/2010    Qualifier: Diagnosis of  By: Regis Bill MD, Standley Brooking     Family History  Problem Relation Age of Onset  . Other Mother     blood clots  . Heart disease Brother   . Heart disease Sister   . Heart disease Brother   . Heart disease Brother   . Stroke Sister   . Throat cancer Father   . Cervical cancer Mother   . Diabetes Other     all siblings, 4 brothers, 5 sisters    History   Social History  . Marital Status: Single    Spouse Name: N/A    Number of Children: N/A  . Years of Education: N/A    Occupational History  . retired    Social History Main Topics  . Smoking status: Never Smoker   . Smokeless tobacco: None  . Alcohol Use: No  . Drug Use: No  . Sexual Activity: None   Other Topics Concern  . None   Social History Narrative   Master level education in math   Pt is currently retired   Pt is divorced with children   Recently had to move had a break in and thus away from her pool exercise    Outpatient Encounter Prescriptions as of 12/02/2013  Medication Sig  . chlorthalidone (HYGROTON) 25 MG tablet Take 1 tablet (25 mg total) by mouth daily.  . cholecalciferol (VITAMIN D) 1000 UNITS tablet Take 1,000 Units by mouth daily.  Marland Kitchen levothyroxine (SYNTHROID, LEVOTHROID) 25 MCG tablet take 1 tablet by mouth once daily  . levothyroxine (SYNTHROID, LEVOTHROID) 25 MCG tablet take 1 tablet by mouth once daily  . Multiple Vitamins-Minerals (CENTRUM SILVER PO) Take 1 tablet by mouth daily.    Marland Kitchen OVER THE COUNTER MEDICATION Osteo Bi-Flex  . potassium chloride (K-DUR,KLOR-CON) 10 MEQ tablet Take 2 tablets (20 mEq total) by mouth daily.  . ramipril (ALTACE) 10 MG capsule Take 2 capsules (20 mg total) by mouth daily.  . [DISCONTINUED] ramipril (ALTACE) 10 MG capsule take 1  capsule by mouth twice a day    EXAM:  BP 136/50 mmHg  Temp(Src) 98.5 F (36.9 C) (Oral)  Ht 5\' 1"  (1.549 m)  Wt 275 lb 14.4 oz (125.147 kg)  BMI 52.16 kg/m2  Body mass index is 52.16 kg/(m^2).  GENERAL: vitals reviewed and listed above, alert, oriented, appears well hydrated and in no acute distress HEENT: atraumatic, conjunctiva  clear, no obvious abnormalities on inspection of external nose and ears MS: moves all extremities without noticeable focal  abnormality PSYCH: pleasant and cooperative, no obvious depression or anxiety Lab Results  Component Value Date   WBC 5.4 02/23/2012   HGB 12.8 02/23/2012   HCT 38.0 02/23/2012   PLT 140* 02/23/2012   GLUCOSE 107* 11/22/2013   CHOL 132 08/09/2013    TRIG 100.0 08/09/2013   HDL 53.20 08/09/2013   LDLCALC 59 08/09/2013   ALT 18 09/25/2011   AST 18 09/25/2011   NA 137 11/22/2013   K 3.8 11/22/2013   CL 103 11/22/2013   CREATININE 1.0 11/22/2013   BUN 17 11/22/2013   CO2 27 11/22/2013   TSH 3.07 03/22/2013   HGBA1C 6.6* 11/22/2013   MICROALBUR 0.4 08/02/2009   Lab Results  Component Value Date   HGBA1C 6.6* 11/22/2013   HGBA1C 6.6* 08/09/2013   HGBA1C 6.5 03/22/2013   Lab Results  Component Value Date   MICROALBUR 0.4 08/02/2009   LDLCALC 59 08/09/2013   CREATININE 1.0 11/22/2013    ASSESSMENT AND PLAN:  Discussed the following assessment and plan:  Type 2 diabetes mellitus without complication - stable pt declines metformin ok continue lsi more frequent monitoring may help also   Morbid obesity - cont healthy weight loss  Essential hypertension - controlled Declined  Metformin .  Not exercise as much  Hard to do 6 am  .   -Patient advised to return or notify health care team  if symptoms worsen ,persist or new concerns arise.  Patient Instructions   Continue monitoring to control sugar .   Readings are acceptable but  Can be better as we know .  Add exercise as much as possible.   Wt Readings from Last 3 Encounters:  12/02/13 275 lb 14.4 oz (125.147 kg)  08/16/13 286 lb (129.729 kg)  06/15/13 284 lb (128.822 kg)     Mariann Laster K. Temprance Wyre M.D.  Total visit 48mins > 50% spent counseling and coordinating care

## 2014-01-19 ENCOUNTER — Other Ambulatory Visit: Payer: Self-pay | Admitting: Internal Medicine

## 2014-01-31 ENCOUNTER — Other Ambulatory Visit: Payer: Self-pay | Admitting: Internal Medicine

## 2014-01-31 NOTE — Telephone Encounter (Signed)
Denied.  Filled on 01/19/14

## 2014-02-09 ENCOUNTER — Other Ambulatory Visit: Payer: Self-pay | Admitting: Internal Medicine

## 2014-02-16 DIAGNOSIS — Z1231 Encounter for screening mammogram for malignant neoplasm of breast: Secondary | ICD-10-CM | POA: Diagnosis not present

## 2014-02-16 LAB — HM MAMMOGRAPHY

## 2014-02-20 ENCOUNTER — Other Ambulatory Visit: Payer: Self-pay | Admitting: Internal Medicine

## 2014-02-21 NOTE — Telephone Encounter (Signed)
Not filled since 2014.  Will check with [facility]

## 2014-02-23 ENCOUNTER — Encounter: Payer: Self-pay | Admitting: Family Medicine

## 2014-02-23 NOTE — Telephone Encounter (Signed)
Ok x  9 months she has been taking 2 per ady and may just be how written   Please calrify what she is taking

## 2014-02-24 NOTE — Telephone Encounter (Signed)
Sent to the pharmacy by e-scribe. 

## 2014-03-02 ENCOUNTER — Telehealth: Payer: Self-pay | Admitting: Internal Medicine

## 2014-03-02 NOTE — Telephone Encounter (Signed)
Fara Olden called from [facility] requesting rx for pt diabetic supplies   [phone] ext [phone]

## 2014-03-03 NOTE — Telephone Encounter (Signed)
Spoke to the pt.  She did not authorized any diabetic supplies from [employer].  Will now close the note.

## 2014-03-27 ENCOUNTER — Other Ambulatory Visit: Payer: Medicare Other | Admitting: Internal Medicine

## 2014-03-27 ENCOUNTER — Other Ambulatory Visit (INDEPENDENT_AMBULATORY_CARE_PROVIDER_SITE_OTHER): Payer: Medicare Other

## 2014-03-27 DIAGNOSIS — E039 Hypothyroidism, unspecified: Secondary | ICD-10-CM | POA: Diagnosis not present

## 2014-03-27 DIAGNOSIS — E119 Type 2 diabetes mellitus without complications: Secondary | ICD-10-CM | POA: Diagnosis not present

## 2014-03-27 LAB — TSH: TSH: 4.56 u[IU]/mL — AB (ref 0.35–4.50)

## 2014-03-27 LAB — HEMOGLOBIN A1C: Hgb A1c MFr Bld: 6.8 % — ABNORMAL HIGH (ref 4.6–6.5)

## 2014-03-28 ENCOUNTER — Other Ambulatory Visit: Payer: Self-pay | Admitting: Internal Medicine

## 2014-03-28 NOTE — Telephone Encounter (Signed)
Its on her med list  From last visit   Confusing  Refill for 90 days she has an upcoming appt and ask her then  aout this

## 2014-03-28 NOTE — Telephone Encounter (Signed)
Not filled since 2014.  Will send to [facility]

## 2014-03-29 NOTE — Telephone Encounter (Signed)
Sent to the pharmacy by e-scribe. 

## 2014-04-03 ENCOUNTER — Encounter: Payer: Self-pay | Admitting: Internal Medicine

## 2014-04-03 ENCOUNTER — Ambulatory Visit (INDEPENDENT_AMBULATORY_CARE_PROVIDER_SITE_OTHER): Payer: Medicare Other | Admitting: Internal Medicine

## 2014-04-03 VITALS — BP 126/60 | Temp 99.6°F | Wt 281.5 lb

## 2014-04-03 DIAGNOSIS — I10 Essential (primary) hypertension: Secondary | ICD-10-CM | POA: Diagnosis not present

## 2014-04-03 DIAGNOSIS — E119 Type 2 diabetes mellitus without complications: Secondary | ICD-10-CM | POA: Diagnosis not present

## 2014-04-03 DIAGNOSIS — E039 Hypothyroidism, unspecified: Secondary | ICD-10-CM

## 2014-04-03 MED ORDER — LEVOTHYROXINE SODIUM 50 MCG PO TABS
50.0000 ug | ORAL_TABLET | Freq: Every day | ORAL | Status: DC
Start: 2014-04-03 — End: 2015-01-08

## 2014-04-03 NOTE — Patient Instructions (Signed)
Continue weight loss to help  The joints.    Intensify lifestyle interventions. To keep sugar down. Increase thryoid medication for now.   Bp is good . Continue medication. Intensify lifestyle interventions.  Labs in 3 -4 months   TSH and  hga1c  Bmp pre visit

## 2014-04-03 NOTE — Progress Notes (Signed)
Pre visit review using our clinic review tool, if applicable. No additional management support is needed unless otherwise documented below in the visit note.  Chief Complaint  Patient presents with  . Follow-up  . Diabetes  . Hypothyroidism  . Hypertension    HPI: Rebecca Rich  comes in today for follow up of  multiple medical problems.  BG  Not as much exercise HT  Controlled taking inc dose of ramipril no se  thyroid  Taking at night with ramipril  Joint pains betters since losing some weight.  Dental  procedures pending  No infection Eyes getting checked wears trifocals ROS: See pertinent positives and negatives per HPI. No fever cp sob syncope numbness or swelling  Past Medical History  Diagnosis Date  . DIABETES MELLITUS, TYPE II 12/08/2006  . HYPERLIPIDEMIA 12/08/2006  . OBESITY 09/24/2009  . Morbid obesity 12/08/2006  . OBSTRUCTIVE SLEEP APNEA 12/08/2006  . HYPERTENSION 12/08/2006  . Acute bronchospasm 08/24/2009  . INFECTION, SKIN AND SOFT TISSUE 07/28/2008  . 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 OSTEOARTHRITIS 12/08/2006  . SHOULDER PAIN, RIGHT 02/07/2008  . Swelling of limb 07/28/2008  . CARDIAC MURMUR 12/08/2006  . RUQ PAIN 06/16/2008  . THYROID FUNCTION TEST, ABNORMAL 12/08/2006  . LIVER FUNCTION TESTS, ABNORMAL 07/28/2008  . COLONIC POLYPS, HX OF 12/08/2006  . 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 Gallbladder/common duct stone, without infection, with obstruction 03/01/2010    removed ercp  . Acute sphenoidal sinusitis 02/26/2010    Qualifier: Diagnosis of  By: Regis Bill MD, Standley Brooking     Family History  Problem Relation Age of Onset  . Other Mother     blood clots  . Heart disease Brother   . Heart disease Sister   . Heart disease Brother   . Heart disease Brother   . Stroke Sister   . Throat cancer Father   . Cervical cancer Mother   . Diabetes Other     all siblings, 4 brothers, 5 sisters    History   Social History  . Marital Status: Single    Spouse Name: N/A  .  Number of Children: N/A  . Years of Education: N/A   Occupational History  . retired    Social History Main Topics  . Smoking status: Never Smoker   . Smokeless tobacco: Not on file  . Alcohol Use: No  . Drug Use: No  . Sexual Activity: Not on file   Other Topics Concern  . Not on file   Social History Narrative   Master level education in math   Pt is currently retired   Pt is divorced with children   Recently had to move had a break in and thus away from her pool exercise    Outpatient Encounter Prescriptions as of 04/03/2014  Medication Sig  . chlorthalidone (HYGROTON) 25 MG tablet TAKE 1 TABLET BY MOUTH DAILY  . cholecalciferol (VITAMIN D) 1000 UNITS tablet Take 1,000 Units by mouth daily.  . Multiple Vitamins-Minerals (CENTRUM SILVER PO) Take 1 tablet by mouth daily.    Marland Kitchen OVER THE COUNTER MEDICATION Osteo Bi-Flex  . potassium chloride (K-DUR,KLOR-CON) 10 MEQ tablet TAKE 2 TABLETS BY MOUTH DAILY  . ramipril (ALTACE) 10 MG capsule Take 2 capsules (20 mg total) by mouth daily.  . [DISCONTINUED] levothyroxine (SYNTHROID, LEVOTHROID) 25 MCG tablet take 1 tablet by mouth once daily  . [DISCONTINUED] levothyroxine (SYNTHROID, LEVOTHROID) 25 MCG tablet TAKE 1 TABLET BY MOUTH ONCE DAILY  . levothyroxine (SYNTHROID, LEVOTHROID) 50 MCG  tablet Take 1 tablet (50 mcg total) by mouth daily.  . [DISCONTINUED] ramipril (ALTACE) 10 MG capsule take 1 capsule by mouth twice a day    EXAM:  BP 126/60 mmHg  Temp(Src) 99.6 F (37.6 C) (Oral)  Wt 281 lb 8 oz (127.688 kg)  Body mass index is 53.22 kg/(m^2).  GENERAL: vitals reviewed and listed above, alert, oriented, appears well hydrated and in no acute distress HEENT: atraumatic, conjunctiva  clear, no obvious abnormalities on inspection of external nose and ears NECK: no obvious masses on inspection palpation  LUNGS: clear to auscultation bilaterally, no wheezes, rales or rhonchi, good air movement CV: HRRR, no clubbing cyanosis nl cap  refill  MS: moves all extremities without noticeable focal  abnormality PSYCH: pleasant and cooperative, no obvious depression or anxiety Lab Results  Component Value Date   WBC 5.4 02/23/2012   HGB 12.8 02/23/2012   HCT 38.0 02/23/2012   PLT 140* 02/23/2012   GLUCOSE 107* 11/22/2013   CHOL 132 08/09/2013   TRIG 100.0 08/09/2013   HDL 53.20 08/09/2013   LDLCALC 59 08/09/2013   ALT 18 09/25/2011   AST 18 09/25/2011   NA 137 11/22/2013   K 3.8 11/22/2013   CL 103 11/22/2013   CREATININE 1.0 11/22/2013   BUN 17 11/22/2013   CO2 27 11/22/2013   TSH 4.56* 03/27/2014   HGBA1C 6.8* 03/27/2014   MICROALBUR 0.4 08/02/2009   Wt Readings from Last 3 Encounters:  04/03/14 281 lb 8 oz (127.688 kg)  12/02/13 275 lb 14.4 oz (125.147 kg)  08/16/13 286 lb (129.729 kg)   Labs reviewed  ASSESSMENT AND PLAN:  Discussed the following assessment and plan:  Type 2 diabetes mellitus without complication - avoiding meds  lsi disc  Essential hypertension - controleld at goal  Morbid obesity  Hypothyroidism, unspecified hypothyroidism type - inc to 50 mcgs  and fu try to take med separate Counseled.  -Patient advised to return or notify health care team  if symptoms worsen ,persist or new concerns arise.  Patient Instructions  Continue weight loss to help  The joints.    Intensify lifestyle interventions. To keep sugar down. Increase thryoid medication for now.   Bp is good . Continue medication. Intensify lifestyle interventions.  Labs in 3 -4 months   TSH and  hga1c  Bmp pre visit      Standley Brooking. Panosh M.D.

## 2014-04-10 ENCOUNTER — Telehealth: Payer: Self-pay

## 2014-04-10 NOTE — Telephone Encounter (Signed)
Note from [place] says: Please send a prescription to [place] for ACCU CHEK AVIVA PLUS TEST STRIPS with dx code and frequency- pt is paying out-of-pocket- trying to save her money. Thank you"

## 2014-04-11 MED ORDER — GLUCOSE BLOOD VI STRP: ORAL_STRIP | Status: DC

## 2014-04-11 NOTE — Telephone Encounter (Signed)
Need to know how many times the pt is testing per day.  Left a message for her to return my call.

## 2014-04-11 NOTE — Telephone Encounter (Signed)
Sent to the pharmacy by e-scribe. 

## 2014-05-09 DIAGNOSIS — H2513 Age-related nuclear cataract, bilateral: Secondary | ICD-10-CM | POA: Diagnosis not present

## 2014-05-09 DIAGNOSIS — E119 Type 2 diabetes mellitus without complications: Secondary | ICD-10-CM | POA: Diagnosis not present

## 2014-05-09 LAB — HM DIABETES EYE EXAM

## 2014-05-16 ENCOUNTER — Encounter: Payer: Self-pay | Admitting: Family Medicine

## 2014-06-16 ENCOUNTER — Ambulatory Visit: Payer: Medicare Other | Admitting: Pulmonary Disease

## 2014-07-24 ENCOUNTER — Other Ambulatory Visit: Payer: Self-pay

## 2014-07-25 ENCOUNTER — Other Ambulatory Visit (INDEPENDENT_AMBULATORY_CARE_PROVIDER_SITE_OTHER): Payer: Medicare Other

## 2014-07-25 DIAGNOSIS — I10 Essential (primary) hypertension: Secondary | ICD-10-CM | POA: Diagnosis not present

## 2014-07-25 DIAGNOSIS — E119 Type 2 diabetes mellitus without complications: Secondary | ICD-10-CM | POA: Diagnosis not present

## 2014-07-25 LAB — HEMOGLOBIN A1C: Hgb A1c MFr Bld: 6.5 % (ref 4.6–6.5)

## 2014-07-25 LAB — BASIC METABOLIC PANEL
BUN: 23 mg/dL (ref 6–23)
CALCIUM: 9.5 mg/dL (ref 8.4–10.5)
CO2: 28 mEq/L (ref 19–32)
CREATININE: 1.19 mg/dL (ref 0.40–1.20)
Chloride: 105 mEq/L (ref 96–112)
GFR: 57.46 mL/min — ABNORMAL LOW (ref >60.00)
GLUCOSE: 127 mg/dL — AB (ref 70–99)
Potassium: 4.2 mEq/L (ref 3.5–5.1)
Sodium: 143 mEq/L (ref 135–145)

## 2014-08-01 ENCOUNTER — Encounter: Payer: Self-pay | Admitting: Internal Medicine

## 2014-08-01 ENCOUNTER — Ambulatory Visit (INDEPENDENT_AMBULATORY_CARE_PROVIDER_SITE_OTHER): Payer: Medicare Other | Admitting: Internal Medicine

## 2014-08-01 VITALS — BP 136/60 | Temp 99.1°F | Ht 61.0 in | Wt 285.1 lb

## 2014-08-01 DIAGNOSIS — E039 Hypothyroidism, unspecified: Secondary | ICD-10-CM | POA: Diagnosis not present

## 2014-08-01 DIAGNOSIS — I10 Essential (primary) hypertension: Secondary | ICD-10-CM | POA: Diagnosis not present

## 2014-08-01 DIAGNOSIS — Z2821 Immunization not carried out because of patient refusal: Secondary | ICD-10-CM

## 2014-08-01 DIAGNOSIS — E119 Type 2 diabetes mellitus without complications: Secondary | ICD-10-CM

## 2014-08-01 DIAGNOSIS — Z9889 Other specified postprocedural states: Secondary | ICD-10-CM

## 2014-08-01 NOTE — Patient Instructions (Signed)
Continue lifestyle intervention healthy eating and exercise . Sugar is better .  Plan wellness visit and  Blood tests tsh hg a1c and lipid and  BMP pre visit .

## 2014-08-01 NOTE — Progress Notes (Signed)
Pre visit review using our clinic review tool, if applicable. No additional management support is needed unless otherwise documented below in the visit note.  Chief Complaint  Patient presents with  . Follow-up    HPI: Rebecca Rich 69 y.o.  comes in for chronic disease/ medication management   weight : Was  57    Over   The holidya  Gained .weight  Feels well  Walking and water aerobic and  Walking.   helps  BG 127/  119 herbalife when travel. Thyroid taking med every day no skin change falling  HT ocass misses diuretic pill  bp has been good  No cp sob syncope  ROS: See pertinent positives and negatives per HPI.had episode vision like raind and resolved  Dr Zadie Rhine advised no thing unless recurs and gets appt no falling numbness    Past Medical History  Diagnosis Date  . DIABETES MELLITUS, TYPE II 12/08/2006  . HYPERLIPIDEMIA 12/08/2006  . OBESITY 09/24/2009  . Morbid obesity 12/08/2006  . OBSTRUCTIVE SLEEP APNEA 12/08/2006  . HYPERTENSION 12/08/2006  . Acute bronchospasm 08/24/2009  . INFECTION, SKIN AND SOFT TISSUE 07/28/2008  . 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 OSTEOARTHRITIS 12/08/2006  . SHOULDER PAIN, RIGHT 02/07/2008  . Swelling of limb 07/28/2008  . CARDIAC MURMUR 12/08/2006  . RUQ PAIN 06/16/2008  . THYROID FUNCTION TEST, ABNORMAL 12/08/2006  . LIVER FUNCTION TESTS, ABNORMAL 07/28/2008  . COLONIC POLYPS, HX OF 12/08/2006  . 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 Gallbladder/common duct stone, without infection, with obstruction 03/01/2010    removed ercp  . Acute sphenoidal sinusitis 02/26/2010    Qualifier: Diagnosis of  By: Regis Bill MD, Standley Brooking     Family History  Problem Relation Age of Onset  . Other Mother     blood clots  . Heart disease Brother   . Heart disease Sister   . Heart disease Brother   . Heart disease Brother   . Stroke Sister   . Throat cancer Father   . Cervical cancer Mother   . Diabetes Other     all siblings, 4 brothers, 5 sisters    History   Social  History  . Marital Status: Single    Spouse Name: N/A  . Number of Children: N/A  . Years of Education: N/A   Occupational History  . retired    Social History Main Topics  . Smoking status: Never Smoker   . Smokeless tobacco: Not on file  . Alcohol Use: No  . Drug Use: No  . Sexual Activity: Not on file   Other Topics Concern  . None   Social History Narrative   Master level education in math   Pt is currently retired   Pt is divorced with children   Recently had to move had a break in and thus away from her pool exercise    Outpatient Prescriptions Prior to Visit  Medication Sig Dispense Refill  . chlorthalidone (HYGROTON) 25 MG tablet TAKE 1 TABLET BY MOUTH DAILY 90 tablet 0  . cholecalciferol (VITAMIN D) 1000 UNITS tablet Take 1,000 Units by mouth daily.    Marland Kitchen glucose blood (ACCU-CHEK AVIVA PLUS) test strip Use as instructed 100 each 12  . levothyroxine (SYNTHROID, LEVOTHROID) 50 MCG tablet Take 1 tablet (50 mcg total) by mouth daily. 90 tablet 3  . Multiple Vitamins-Minerals (CENTRUM SILVER PO) Take 1 tablet by mouth daily.      . potassium chloride (K-DUR,KLOR-CON) 10 MEQ tablet TAKE 2 TABLETS BY MOUTH  DAILY 180 tablet 1  . ramipril (ALTACE) 10 MG capsule Take 2 capsules (20 mg total) by mouth daily. 180 capsule 3  . OVER THE COUNTER MEDICATION Osteo Bi-Flex     No facility-administered medications prior to visit.     EXAM:  BP 136/60 mmHg  Temp(Src) 99.1 F (37.3 C) (Oral)  Ht 5\' 1"  (1.549 m)  Wt 285 lb 1.6 oz (129.321 kg)  BMI 53.90 kg/m2  Body mass index is 53.9 kg/(m^2).  GENERAL: vitals reviewed and listed above, alert, oriented, appears well hydrated and in no acute distress HEENT: atraumatic, conjunctiva  clear, no obvious abnormalities on inspection of external nose and ears  NECK: no obvious masses on inspection palpation  LUNGS: clear to auscultation bilaterally, no wheezes, rales or rhonchi, CV: HRRR, 2/6 sem lusb no radiationno clubbing  cyanosis or  peripheral edema nl cap refill  MS: moves all extremities without noticeable focal  abnormality PSYCH: pleasant and cooperative, no obvious depression or anxiety Lab Results  Component Value Date   WBC 5.4 02/23/2012   HGB 12.8 02/23/2012   HCT 38.0 02/23/2012   PLT 140* 02/23/2012   GLUCOSE 127* 07/25/2014   CHOL 132 08/09/2013   TRIG 100.0 08/09/2013   HDL 53.20 08/09/2013   LDLCALC 59 08/09/2013   ALT 18 09/25/2011   AST 18 09/25/2011   NA 143 07/25/2014   K 4.2 07/25/2014   CL 105 07/25/2014   CREATININE 1.19 07/25/2014   BUN 23 07/25/2014   CO2 28 07/25/2014   TSH 4.56* 03/27/2014   HGBA1C 6.5 07/25/2014   MICROALBUR 0.4 08/02/2009   Wt Readings from Last 3 Encounters:  08/01/14 285 lb 1.6 oz (129.321 kg)  04/03/14 281 lb 8 oz (127.688 kg)  12/02/13 275 lb 14.4 oz (125.147 kg)   BP Readings from Last 3 Encounters:  08/01/14 136/60  04/03/14 126/60  12/02/13 136/50    ASSESSMENT AND PLAN:  Discussed the following assessment and plan:  Type 2 diabetes mellitus without complication - pt avoiding meds   Hypothyroidism, unspecified hypothyroidism type - ttsh was not done as planned get with next labs  Essential hypertension - controlled per patient   Morbid obesity  23-polyvalent pneumococcal polysaccharide vaccine declined Due for lipid mpnitoring also next check  And tsh  This was requested and unfortunately not done for this visit  But pt doing well so get at next labs  -Patient advised to return or notify health care team  if symptoms worsen ,persist or new concerns arise.  Patient Instructions  Continue lifestyle intervention healthy eating and exercise . Sugar is better .  Plan wellness visit and  Blood tests tsh hg a1c and lipid and  BMP pre visit .     Standley Brooking. Karla Vines M.D.

## 2014-08-16 ENCOUNTER — Ambulatory Visit (INDEPENDENT_AMBULATORY_CARE_PROVIDER_SITE_OTHER): Payer: Medicare Other | Admitting: Podiatry

## 2014-08-16 ENCOUNTER — Encounter: Payer: Self-pay | Admitting: Podiatry

## 2014-08-16 VITALS — BP 133/60 | HR 74 | Resp 12

## 2014-08-16 DIAGNOSIS — E119 Type 2 diabetes mellitus without complications: Secondary | ICD-10-CM | POA: Diagnosis not present

## 2014-08-16 NOTE — Progress Notes (Signed)
   Subjective:    Patient ID: Rebecca Rich, female    DOB: [birth year], 69 y.o.   MRN: [id number]  HPI   This patient presents today requesting  diabetic foot examination as well as evaluation of a chronic edema in her ankle area persistent for multiple years with increasing edema in the p.m. and reducing edema in the a.m. She describes no progression of the edema over time. She describes no specific treatment for the edema.  Patient is a type II diabetic proximal he 16 years and denies any history of foot ulceration, claudication or amputation  Review of Systems  Constitutional: Positive for unexpected weight change.  Cardiovascular: Positive for leg swelling.  Hematological: Bruises/bleeds easily.  All other systems reviewed and are negative.      Objective:   Physical Exam  Orientated 3  Vascular: Pitting edema ankles bilaterally No calf pain or calf edema noted bilaterally DP and PT pulses 2/4 bilaterally Capillary reflex immediate bilaterally  Neurological: Sensation to 10 g monofilament wire intact 5/5 bilaterally Vibratory sensation reactive bilaterally Ankle reflex equal and reactive bilaterally  Dermatological: Dry skin noted bilaterally No skin lesions noted bilaterally Hypertrophic deformed toenails for-5 right and 2, 4, 5 left  Musculoskeletal: No deformities noted bilaterally There is no restriction ankle, subtalar, midtarsal joints bilaterally       Assessment & Plan:   Assessment: Chronic peripheral edema Diabetic with without any obvious complications  Plan: I reviewed the results of the examination with patient today. I made aware as is the longest the edema was not increasing over time and that she had regular physical examination no active treatment was indicated. I told her that her diabetic foot examination was within normal limits.  Reappoint yearly or at patient's request

## 2014-08-16 NOTE — Patient Instructions (Signed)
Diabetes and Foot Care Diabetes may cause you to have problems because of poor blood supply (circulation) to your feet and legs. This may cause the skin on your feet to become thinner, break easier, and heal more slowly. Your skin may become dry, and the skin may peel and crack. You may also have nerve damage in your legs and feet causing decreased feeling in them. You may not notice minor injuries to your feet that could lead to infections or more serious problems. Taking care of your feet is one of the most important things you can do for yourself.  HOME CARE INSTRUCTIONS  Wear shoes at all times, even in the house. Do not go barefoot. Bare feet are easily injured.  Check your feet daily for blisters, cuts, and redness. If you cannot see the bottom of your feet, use a mirror or ask someone for help.  Wash your feet with warm water (do not use hot water) and mild soap. Then pat your feet and the areas between your toes until they are completely dry. Do not soak your feet as this can dry your skin.  Apply a moisturizing lotion or petroleum jelly (that does not contain alcohol and is unscented) to the skin on your feet and to dry, brittle toenails. Do not apply lotion between your toes.  Trim your toenails straight across. Do not dig under them or around the cuticle. File the edges of your nails with an emery board or nail file.  Do not cut corns or calluses or try to remove them with medicine.  Wear clean socks or stockings every day. Make sure they are not too tight. Do not wear knee-high stockings since they may decrease blood flow to your legs.  Wear shoes that fit properly and have enough cushioning. To break in new shoes, wear them for just a few hours a day. This prevents you from injuring your feet. Always look in your shoes before you put them on to be sure there are no objects inside.  Do not cross your legs. This may decrease the blood flow to your feet.  If you find a minor scrape,  cut, or break in the skin on your feet, keep it and the skin around it clean and dry. These areas may be cleansed with mild soap and water. Do not cleanse the area with peroxide, alcohol, or iodine.  When you remove an adhesive bandage, be sure not to damage the skin around it.  If you have a wound, look at it several times a day to make sure it is healing.  Do not use heating pads or hot water bottles. They may burn your skin. If you have lost feeling in your feet or legs, you may not know it is happening until it is too late.  Make sure your health care provider performs a complete foot exam at least annually or more often if you have foot problems. Report any cuts, sores, or bruises to your health care provider immediately. SEEK MEDICAL CARE IF:   You have an injury that is not healing.  You have cuts or breaks in the skin.  You have an ingrown nail.  You notice redness on your legs or feet.  You feel burning or tingling in your legs or feet.  You have pain or cramps in your legs and feet.  Your legs or feet are numb.  Your feet always feel cold. SEEK IMMEDIATE MEDICAL CARE IF:   There is increasing redness,   swelling, or pain in or around a wound.  There is a red line that goes up your leg.  Pus is coming from a wound.  You develop a fever or as directed by your health care provider.  You notice a bad smell coming from an ulcer or wound. Document Released: 01/11/2000 Document Revised: 09/15/2012 Document Reviewed: 06/22/2012 [employer] Patient Information 2015 [employer]. This information is not intended to replace advice given to you by your health care provider. Make sure you discuss any questions you have with your health care provider.  

## 2014-10-31 ENCOUNTER — Ambulatory Visit (INDEPENDENT_AMBULATORY_CARE_PROVIDER_SITE_OTHER): Payer: Medicare Other

## 2014-10-31 ENCOUNTER — Telehealth: Payer: Self-pay | Admitting: Internal Medicine

## 2014-10-31 ENCOUNTER — Ambulatory Visit (INDEPENDENT_AMBULATORY_CARE_PROVIDER_SITE_OTHER): Payer: Medicare Other | Admitting: Urgent Care

## 2014-10-31 VITALS — BP 184/77 | HR 70 | Temp 98.8°F | Resp 16 | Ht 62.0 in | Wt 294.0 lb

## 2014-10-31 DIAGNOSIS — I160 Hypertensive urgency: Secondary | ICD-10-CM | POA: Diagnosis not present

## 2014-10-31 DIAGNOSIS — IMO0001 Reserved for inherently not codable concepts without codable children: Secondary | ICD-10-CM | POA: Insufficient documentation

## 2014-10-31 DIAGNOSIS — R030 Elevated blood-pressure reading, without diagnosis of hypertension: Secondary | ICD-10-CM | POA: Diagnosis not present

## 2014-10-31 DIAGNOSIS — I358 Other nonrheumatic aortic valve disorders: Secondary | ICD-10-CM

## 2014-10-31 DIAGNOSIS — I10 Essential (primary) hypertension: Secondary | ICD-10-CM

## 2014-10-31 DIAGNOSIS — R609 Edema, unspecified: Secondary | ICD-10-CM

## 2014-10-31 DIAGNOSIS — I359 Nonrheumatic aortic valve disorder, unspecified: Secondary | ICD-10-CM | POA: Diagnosis not present

## 2014-10-31 DIAGNOSIS — I517 Cardiomegaly: Secondary | ICD-10-CM

## 2014-10-31 DIAGNOSIS — R069 Unspecified abnormalities of breathing: Secondary | ICD-10-CM | POA: Diagnosis not present

## 2014-10-31 NOTE — Patient Instructions (Addendum)
- Increase chlorthalidone to 25mg  twice daily. - Keep your Altace at 2 pills once daily. - I highly recommend that you put a hold on any travel plans until you can get your blood pressure under control and be seen by a cardiologist.    Managing Your High Blood Pressure Blood pressure is a measurement of how forceful your blood is pressing against the walls of the arteries. Arteries are muscular tubes within the circulatory system. Blood pressure does not stay the same. Blood pressure rises when you are active, excited, or nervous; and it lowers during sleep and relaxation. If the numbers measuring your blood pressure stay above normal most of the time, you are at risk for health problems. High blood pressure (hypertension) is a long-term (chronic) condition in which blood pressure is elevated. A blood pressure reading is recorded as two numbers, such as 120 over 80 (or 120/80). The first, higher number is called the systolic pressure. It is a measure of the pressure in your arteries as the heart beats. The second, lower number is called the diastolic pressure. It is a measure of the pressure in your arteries as the heart relaxes between beats.  Keeping your blood pressure in a normal range is important to your overall health and prevention of health problems, such as heart disease and stroke. When your blood pressure is uncontrolled, your heart has to work harder than normal. High blood pressure is a very common condition in adults because blood pressure tends to rise with age. Men and women are equally likely to have hypertension but at different times in life. Before age 39, men are more likely to have hypertension. After 71 years of age, women are more likely to have it. Hypertension is especially common in African Americans. This condition often has no signs or symptoms. The cause of the condition is usually not known. Your caregiver can help you come up with a plan to keep your blood pressure in a  normal, healthy range. BLOOD PRESSURE STAGES Blood pressure is classified into four stages: normal, prehypertension, stage 1, and stage 2. Your blood pressure reading will be used to determine what type of treatment, if any, is necessary. Appropriate treatment options are tied to these four stages:  Normal  Systolic pressure (mm Hg): below 120.  Diastolic pressure (mm Hg): below 80. Prehypertension  Systolic pressure (mm Hg): 120 to 139.  Diastolic pressure (mm Hg): 80 to 89. Stage1  Systolic pressure (mm Hg): 140 to 159.  Diastolic pressure (mm Hg): 90 to 99. Stage2  Systolic pressure (mm Hg): 160 or above.  Diastolic pressure (mm Hg): 100 or above. RISKS RELATED TO HIGH BLOOD PRESSURE Managing your blood pressure is an important responsibility. Uncontrolled high blood pressure can lead to:  A heart attack.  A stroke.  A weakened blood vessel (aneurysm).  Heart failure.  Kidney damage.  Eye damage.  Metabolic syndrome.  Memory and concentration problems. HOW TO MANAGE YOUR BLOOD PRESSURE Blood pressure can be managed effectively with lifestyle changes and medicines (if needed). Your caregiver will help you come up with a plan to bring your blood pressure within a normal range. Your plan should include the following: Education  Read all information provided by your caregivers about how to control blood pressure.  Educate yourself on the latest guidelines and treatment recommendations. New research is always being done to further define the risks and treatments for high blood pressure. Lifestylechanges  Control your weight.  Avoid smoking.  Stay physically  active.  Reduce the amount of salt in your diet.  Reduce stress.  Control any chronic conditions, such as high cholesterol or diabetes.  Reduce your alcohol intake. Medicines  Several medicines (antihypertensive medicines) are available, if needed, to bring blood pressure within a normal  range. Communication  Review all the medicines you take with your caregiver because there may be side effects or interactions.  Talk with your caregiver about your diet, exercise habits, and other lifestyle factors that may be contributing to high blood pressure.  See your caregiver regularly. Your caregiver can help you create and adjust your plan for managing high blood pressure. RECOMMENDATIONS FOR TREATMENT AND FOLLOW-UP  The following recommendations are based on current guidelines for managing high blood pressure in nonpregnant adults. Use these recommendations to identify the proper follow-up period or treatment option based on your blood pressure reading. You can discuss these options with your caregiver.  Systolic pressure of 654 to 650 or diastolic pressure of 80 to 89: Follow up with your caregiver as directed.  Systolic pressure of 354 to 656 or diastolic pressure of 90 to 100: Follow up with your caregiver within 2 months.  Systolic pressure above 812 or diastolic pressure above 751: Follow up with your caregiver within 1 month.  Systolic pressure above 700 or diastolic pressure above 174: Consider antihypertensive therapy; follow up with your caregiver within 1 week.  Systolic pressure above 944 or diastolic pressure above 967: Begin antihypertensive therapy; follow up with your caregiver within 1 week. Document Released: 10/08/2011 Document Reviewed: 10/08/2011 [facility] Patient Information 2015 [place]. This information is not intended to replace advice given to you by your health care provider. Make sure you discuss any questions you have with your health care provider.

## 2014-10-31 NOTE — Telephone Encounter (Signed)
Called and spoke to patient. Patient states she is at the Urgent Care now

## 2014-10-31 NOTE — Telephone Encounter (Signed)
Patient Name: Rebecca Rich  DOB: [birth year]    Initial Comment Caller says her BP is higher than usual and the med is not impacting it at all. It is 190/86 now; feels a bit shaky    Nurse Assessment  Nurse: Thad Ranger, RN, Langley Gauss Date/Time (Eastern Time): 10/31/2014 3:41:26 PM  Confirm and document reason for call. If symptomatic, describe symptoms. ---Caller says her BP is higher than usual and the med is not impacting it at all. BP is 190/86 now and she feels a bit shaky and has a HA. Takes Altace 10mg  po q hs. States her BP has been 190-200 all day.  Has the patient traveled out of the country within the last 30 days? ---Not Applicable  Does the patient have any new or worsening symptoms? ---Yes  Will a triage be completed? ---Yes  Related visit to physician within the last 2 weeks? ---No  Does the PT have any chronic conditions? (i.e. diabetes, asthma, etc.) ---Yes  List chronic conditions. ---HTN, DM     Guidelines    Guideline Title Affirmed Question Affirmed Notes  High Blood Pressure BP ? 180/110    Final Disposition User   See Physician within 24 Hours Carmon, RN, Langley Gauss    Comments  Attempted to make an appt but there are no appts available this afternoon. Advised to be seen at the [facility] on [address], [place], and take a list of all meds she takes both Rx and OTC. Pt states she will proceed to the [facility] now.   Referrals  [facility]   Disagree/Comply: Comply

## 2014-10-31 NOTE — Progress Notes (Signed)
MRN: [id number] DOB: February 08, 1945  Subjective:   Rebecca Rich is a 69 y.o. female with pmh of HTN, DM presenting for chief complaint of Hypertension  Reports that last night patient had a mild headache, head numbness, malaise, lower leg swelling. Patient measured her BP at 202/90. Usually she runs 140's. Patient took her Altace and thyroid medications and went to sleep. Her malaise continued today, BP was 180's-190's systolic. She was advised by her PCP to take an additional Altace dose with her chlorthalidone. However, her BP remains high. Of note, 4 days ago patient had very focal left sided chest pain that lasted about 2 hours, pain resolved after she ate. She had an ECHO in 2014 and showed EF of 55-60%, aortic valve murmur. She denies history of heart disease or heart attacks. She does not have a cardiologist. Denies smoking cigarettes. Denies lightheadedness, dizziness, severe headache, double vision, chest pain, shortness of breath, heart racing, palpitations, nausea, vomiting, abdominal pain, hematuria. Denies any other aggravating or relieving factors, no other questions or concerns.  Rebecca Rich has a current medication list which includes the following prescription(s): chlorthalidone, cholecalciferol, glucose blood, levothyroxine, misc natural products, multiple vitamins-minerals, potassium chloride, and ramipril. Also has No Known Allergies.  Rebecca Rich  has a past medical history of DIABETES MELLITUS, TYPE II (12/08/2006); HYPERLIPIDEMIA (12/08/2006); OBESITY (09/24/2009); Morbid obesity (HCC) (12/08/2006); OBSTRUCTIVE SLEEP APNEA (12/08/2006); HYPERTENSION (12/08/2006); Acute bronchospasm (08/24/2009); INFECTION, SKIN AND SOFT TISSUE (07/28/2008); KELOID (10/05/2008); OSTEOARTHRITIS (12/08/2006); SHOULDER PAIN, RIGHT (02/07/2008); Swelling of limb (07/28/2008); CARDIAC MURMUR (12/08/2006); RUQ PAIN (06/16/2008); THYROID FUNCTION TEST, ABNORMAL (12/08/2006); LIVER FUNCTION TESTS, ABNORMAL (07/28/2008); COLONIC  POLYPS, HX OF (12/08/2006); Idiopathic cardiomegaly (01/31/2010); Gallbladder/common duct stone, without infection, with obstruction (03/01/2010); and Acute sphenoidal sinusitis (02/26/2010). Also  has past surgical history that includes Abdominal hysterectomy; Cholecystectomy (06/27/2008); common duct stone; and Colonoscopy (N/A, 12/13/2012).  Objective:   Vitals: BP 184/77 mmHg  Pulse 70  Temp(Src) 98.8 F (37.1 C) (Oral)  Resp 16  Ht 5\' 2"  (1.575 m)  Wt 294 lb (133.358 kg)  BMI 53.76 kg/m2  SpO2 97%  Wt Readings from Last 3 Encounters:  10/31/14 294 lb (133.358 kg)  08/01/14 285 lb 1.6 oz (129.321 kg)  04/03/14 281 lb 8 oz (127.688 kg)   Physical Exam  Constitutional: She is oriented to person, place, and time. She appears well-developed and well-nourished.  HENT:  Mouth/Throat: Oropharynx is clear and moist.  Eyes: Conjunctivae are normal. Pupils are equal, round, and reactive to light. No scleral icterus.  Neck: Normal range of motion. Neck supple.  Cardiovascular: Normal rate, regular rhythm and intact distal pulses.  Exam reveals no gallop and no friction rub.   Murmur (Grade III/VI systolic ejection murmur best heard at the left upper sternal border) heard. Pulmonary/Chest: No respiratory distress. She has no wheezes. She has no rales.  Abdominal: Soft. Bowel sounds are normal. She exhibits no distension and no mass. There is no tenderness.  Musculoskeletal: She exhibits edema (trace edema up to the knee bilaterally).  Neurological: She is alert and oriented to person, place, and time. No cranial nerve deficit. Coordination normal.  No slurred speech. Negative Romberg.  Skin: Skin is warm and dry. No rash noted. No erythema. No pallor.  Psychiatric: She has a normal mood and affect.   ECG interpretation by Dr. Merla Riches and PA-Tevita Gomer - nonspecific T-wave abnormalities in Leads V1-2, V4-6 but no acute changes.  [facility] reading (PRIMARY) by  Dr. Merla Riches and PA-Raylee Strehl. Chest -  cardiomegaly, with  a several scattered infiltrates bilaterally, please comment.  Dg Chest 2 View  10/31/2014   CLINICAL DATA:  Short of breath  EXAM: CHEST  2 VIEW  COMPARISON:  08/25/1999  FINDINGS: Severe cardiomegaly. Low volumes with bibasilar atelectasis. Normal vascularity. No pneumothorax. No Kerley B-lines. No pleural effusion. No pneumothorax. Aorta remains prominent.  IMPRESSION: Cardiomegaly without decompensation.  Bibasilar atelectasis.   Electronically Signed   By: Jolaine Click M.D.   On: 10/31/2014 18:12   Assessment and Plan :   1. Elevated blood pressure 2. Essential hypertension 3. Peripheral edema 4. Cardiomegaly 5. Aortic heart murmur - Labs pending. Patient's lack of symptoms, physical exam, EKG are reassuring for ruling out an acute cardiopulmonary process. I counseled patient on differential which includes heart failure. Patient needs urgent cardiac evaluation, she is to double her dose of chlorthalidone from 25mg  to 50mg  until then. Patient admits that she plans on traveling to [facility] Thursday to Sunday. I highly recommended against this so that she can get her blood pressure under control and be evaluated by cardiologist. Patient states that she will make a decision on Thursday but likely will travel anyway. - I will copy her PCP, Dr. Fabian Sharp.  Wallis Bamberg, PA-C [facility] [phone] 10/31/2014 5:16 PM

## 2014-10-31 NOTE — Telephone Encounter (Signed)
Lets follow up together to ensure patient went to UC or schedules appointment with [place]

## 2014-11-01 DIAGNOSIS — Z6841 Body Mass Index (BMI) 40.0 and over, adult: Secondary | ICD-10-CM | POA: Diagnosis not present

## 2014-11-01 DIAGNOSIS — I10 Essential (primary) hypertension: Secondary | ICD-10-CM | POA: Diagnosis not present

## 2014-11-01 DIAGNOSIS — E7800 Pure hypercholesterolemia, unspecified: Secondary | ICD-10-CM | POA: Diagnosis not present

## 2014-11-01 LAB — COMPREHENSIVE METABOLIC PANEL
ALT: 11 U/L (ref 6–29)
AST: 12 U/L (ref 10–35)
Albumin: 4.2 g/dL (ref 3.6–5.1)
Alkaline Phosphatase: 67 U/L (ref 33–130)
BUN: 17 mg/dL (ref 7–25)
CHLORIDE: 107 mmol/L (ref 98–110)
CO2: 25 mmol/L (ref 20–31)
Calcium: 9.1 mg/dL (ref 8.6–10.4)
Creat: 0.92 mg/dL (ref 0.60–0.93)
Glucose, Bld: 95 mg/dL (ref 65–99)
Potassium: 4.1 mmol/L (ref 3.5–5.3)
Sodium: 141 mmol/L (ref 135–146)
Total Bilirubin: 0.5 mg/dL (ref 0.2–1.2)
Total Protein: 6.8 g/dL (ref 6.1–8.1)

## 2014-11-01 LAB — BRAIN NATRIURETIC PEPTIDE: Brain Natriuretic Peptide: 165.2 pg/mL — ABNORMAL HIGH (ref 0.0–100.0)

## 2014-11-07 DIAGNOSIS — R0989 Other specified symptoms and signs involving the circulatory and respiratory systems: Secondary | ICD-10-CM | POA: Diagnosis not present

## 2014-11-30 ENCOUNTER — Other Ambulatory Visit: Payer: Self-pay | Admitting: Internal Medicine

## 2014-11-30 NOTE — Telephone Encounter (Signed)
Sent to the pharmacy by e-scribe.  Pt has upcoming appt on 01/08/15

## 2014-12-08 ENCOUNTER — Other Ambulatory Visit: Payer: Self-pay | Admitting: Internal Medicine

## 2014-12-11 NOTE — Telephone Encounter (Signed)
Sent to the pharmacy by e-scribe. 

## 2014-12-13 DIAGNOSIS — I10 Essential (primary) hypertension: Secondary | ICD-10-CM | POA: Diagnosis not present

## 2014-12-13 DIAGNOSIS — Z6841 Body Mass Index (BMI) 40.0 and over, adult: Secondary | ICD-10-CM | POA: Diagnosis not present

## 2014-12-13 DIAGNOSIS — E119 Type 2 diabetes mellitus without complications: Secondary | ICD-10-CM | POA: Diagnosis not present

## 2014-12-28 DIAGNOSIS — I350 Nonrheumatic aortic (valve) stenosis: Secondary | ICD-10-CM | POA: Diagnosis not present

## 2015-01-02 ENCOUNTER — Other Ambulatory Visit (INDEPENDENT_AMBULATORY_CARE_PROVIDER_SITE_OTHER): Payer: Medicare Other

## 2015-01-02 DIAGNOSIS — E785 Hyperlipidemia, unspecified: Secondary | ICD-10-CM | POA: Diagnosis not present

## 2015-01-02 DIAGNOSIS — I10 Essential (primary) hypertension: Secondary | ICD-10-CM

## 2015-01-02 DIAGNOSIS — I519 Heart disease, unspecified: Secondary | ICD-10-CM

## 2015-01-02 DIAGNOSIS — E119 Type 2 diabetes mellitus without complications: Secondary | ICD-10-CM

## 2015-01-02 DIAGNOSIS — E039 Hypothyroidism, unspecified: Secondary | ICD-10-CM

## 2015-01-02 LAB — BASIC METABOLIC PANEL
BUN: 31 mg/dL — AB (ref 6–23)
CHLORIDE: 104 meq/L (ref 96–112)
CO2: 25 mEq/L (ref 19–32)
CREATININE: 1.14 mg/dL (ref 0.40–1.20)
Calcium: 9.3 mg/dL (ref 8.4–10.5)
GFR: 60.3 mL/min (ref >60.00)
GLUCOSE: 143 mg/dL — AB (ref 70–99)
POTASSIUM: 3.6 meq/L (ref 3.5–5.1)
Sodium: 138 mEq/L (ref 135–145)

## 2015-01-02 LAB — HEMOGLOBIN A1C: Hgb A1c MFr Bld: 7.2 % — ABNORMAL HIGH (ref 4.6–6.5)

## 2015-01-02 LAB — LIPID PANEL
CHOL/HDL RATIO: 3
Cholesterol: 144 mg/dL (ref 0–200)
HDL: 45.3 mg/dL (ref >39.00)
LDL CALC: 72 mg/dL (ref 0–99)
NonHDL: 98.46
TRIGLYCERIDES: 131 mg/dL (ref 0.0–149.0)
VLDL: 26.2 mg/dL (ref 0.0–40.0)

## 2015-01-02 LAB — TSH: TSH: 5.5 u[IU]/mL — ABNORMAL HIGH (ref 0.35–4.50)

## 2015-01-08 ENCOUNTER — Ambulatory Visit (INDEPENDENT_AMBULATORY_CARE_PROVIDER_SITE_OTHER): Payer: Medicare Other | Admitting: Internal Medicine

## 2015-01-08 ENCOUNTER — Encounter: Payer: Self-pay | Admitting: Internal Medicine

## 2015-01-08 VITALS — BP 132/60 | Temp 98.8°F | Ht 61.5 in | Wt 289.2 lb

## 2015-01-08 DIAGNOSIS — I10 Essential (primary) hypertension: Secondary | ICD-10-CM

## 2015-01-08 DIAGNOSIS — G4733 Obstructive sleep apnea (adult) (pediatric): Secondary | ICD-10-CM

## 2015-01-08 DIAGNOSIS — Z0000 Encounter for general adult medical examination without abnormal findings: Secondary | ICD-10-CM

## 2015-01-08 DIAGNOSIS — E119 Type 2 diabetes mellitus without complications: Secondary | ICD-10-CM

## 2015-01-08 DIAGNOSIS — E039 Hypothyroidism, unspecified: Secondary | ICD-10-CM

## 2015-01-08 MED ORDER — LEVOTHYROXINE SODIUM 75 MCG PO TABS: 75.0000 ug | ORAL_TABLET | Freq: Every day | ORAL | Status: DC

## 2015-01-08 NOTE — Patient Instructions (Addendum)
Increase   the thyroid med to 75 per day and plan fu .  tsh   In 2-3 months . Sugars are worse .  And   Respect your wishes to  Not add medication   Plan hga1c with thryoid check  in 3 months . And FU  Please fu with cardiology  As  They advised for long term   Fu plans  .   Health Maintenance, Female Adopting a healthy lifestyle and getting preventive care can go a long way to promote health and wellness. Talk with your health care provider about what schedule of regular examinations is right for you. This is a good chance for you to check in with your provider about disease prevention and staying healthy. In between checkups, there are plenty of things you can do on your own. Experts have done a lot of research about which lifestyle changes and preventive measures are most likely to keep you healthy. Ask your health care provider for more information. WEIGHT AND DIET  Eat a healthy diet  Be sure to include plenty of vegetables, fruits, low-fat dairy products, and lean protein.  Do not eat a lot of foods high in solid fats, added sugars, or salt.  Get regular exercise. This is one of the most important things you can do for your health.  Most adults should exercise for at least 150 minutes each week. The exercise should increase your heart rate and make you sweat (moderate-intensity exercise).  Most adults should also do strengthening exercises at least twice a week. This is in addition to the moderate-intensity exercise.  Maintain a healthy weight  Body mass index (BMI) is a measurement that can be used to identify possible weight problems. It estimates body fat based on height and weight. Your health care provider can help determine your BMI and help you achieve or maintain a healthy weight.  For females 71 years of age and older:   A BMI below 18.5 is considered underweight.  A BMI of 18.5 to 24.9 is normal.  A BMI of 25 to 29.9 is considered overweight.  A BMI of 30 and above  is considered obese.  Watch levels of cholesterol and blood lipids  You should start having your blood tested for lipids and cholesterol at 71 years of age, then have this test every 5 years.  You may need to have your cholesterol levels checked more often if:  Your lipid or cholesterol levels are high.  You are older than 71 years of age.  You are at high risk for heart disease.  CANCER SCREENING   Lung Cancer  Lung cancer screening is recommended for adults 27-11 years old who are at high risk for lung cancer because of a history of smoking.  A yearly low-dose CT scan of the lungs is recommended for people who:  Currently smoke.  Have quit within the past 15 years.  Have at least a 30-pack-year history of smoking. A pack year is smoking an average of one pack of cigarettes a day for 1 year.  Yearly screening should continue until it has been 15 years since you quit.  Yearly screening should stop if you develop a health problem that would prevent you from having lung cancer treatment.  Breast Cancer  Practice breast self-awareness. This means understanding how your breasts normally appear and feel.  It also means doing regular breast self-exams. Let your health care provider know about any changes, no matter how small.  If  you are in your 20s or 30s, you should have a clinical breast exam (CBE) by a health care provider every 1-3 years as part of a regular health exam.  If you are 71 or older, have a CBE every year. Also consider having a breast X-ray (mammogram) every year.  If you have a family history of breast cancer, talk to your health care provider about genetic screening.  If you are at high risk for breast cancer, talk to your health care provider about having an MRI and a mammogram every year.  Breast cancer gene (BRCA) assessment is recommended for women who have family members with BRCA-related cancers. BRCA-related cancers  include:  Breast.  Ovarian.  Tubal.  Peritoneal cancers.  Results of the assessment will determine the need for genetic counseling and BRCA1 and BRCA2 testing. Cervical Cancer Your health care provider may recommend that you be screened regularly for cancer of the pelvic organs (ovaries, uterus, and vagina). This screening involves a pelvic examination, including checking for microscopic changes to the surface of your cervix (Pap test). You may be encouraged to have this screening done every 3 years, beginning at age 22.  For women ages 69-65, health care providers may recommend pelvic exams and Pap testing every 3 years, or they may recommend the Pap and pelvic exam, combined with testing for human papilloma virus (HPV), every 5 years. Some types of HPV increase your risk of cervical cancer. Testing for HPV may also be done on women of any age with unclear Pap test results.  Other health care providers may not recommend any screening for nonpregnant women who are considered low risk for pelvic cancer and who do not have symptoms. Ask your health care provider if a screening pelvic exam is right for you.  If you have had past treatment for cervical cancer or a condition that could lead to cancer, you need Pap tests and screening for cancer for at least 20 years after your treatment. If Pap tests have been discontinued, your risk factors (such as having a new sexual partner) need to be reassessed to determine if screening should resume. Some women have medical problems that increase the chance of getting cervical cancer. In these cases, your health care provider may recommend more frequent screening and Pap tests. Colorectal Cancer  This type of cancer can be detected and often prevented.  Routine colorectal cancer screening usually begins at 71 years of age and continues through 71 years of age.  Your health care provider may recommend screening at an earlier age if you have risk factors for  colon cancer.  Your health care provider may also recommend using home test kits to check for hidden blood in the stool.  A small camera at the end of a tube can be used to examine your colon directly (sigmoidoscopy or colonoscopy). This is done to check for the earliest forms of colorectal cancer.  Routine screening usually begins at age 16.  Direct examination of the colon should be repeated every 5-10 years through 71 years of age. However, you may need to be screened more often if early forms of precancerous polyps or small growths are found. Skin Cancer  Check your skin from head to toe regularly.  Tell your health care provider about any new moles or changes in moles, especially if there is a change in a mole's shape or color.  Also tell your health care provider if you have a mole that is larger than the size  of a pencil eraser.  Always use sunscreen. Apply sunscreen liberally and repeatedly throughout the day.  Protect yourself by wearing long sleeves, pants, a wide-brimmed hat, and sunglasses whenever you are outside. HEART DISEASE, DIABETES, AND HIGH BLOOD PRESSURE   High blood pressure causes heart disease and increases the risk of stroke. High blood pressure is more likely to develop in:  People who have blood pressure in the high end of the normal range (130-139/85-89 mm Hg).  People who are overweight or obese.  People who are African American.  If you are 36-15 years of age, have your blood pressure checked every 3-5 years. If you are 58 years of age or older, have your blood pressure checked every year. You should have your blood pressure measured twice--once when you are at a hospital or clinic, and once when you are not at a hospital or clinic. Record the average of the two measurements. To check your blood pressure when you are not at a hospital or clinic, you can use:  An automated blood pressure machine at a pharmacy.  A home blood pressure monitor.  If you  are between 61 years and 74 years old, ask your health care provider if you should take aspirin to prevent strokes.  Have regular diabetes screenings. This involves taking a blood sample to check your fasting blood sugar level.  If you are at a normal weight and have a low risk for diabetes, have this test once every three years after 71 years of age.  If you are overweight and have a high risk for diabetes, consider being tested at a younger age or more often. PREVENTING INFECTION  Hepatitis B  If you have a higher risk for hepatitis B, you should be screened for this virus. You are considered at high risk for hepatitis B if:  You were born in a country where hepatitis B is common. Ask your health care provider which countries are considered high risk.  Your parents were born in a high-risk country, and you have not been immunized against hepatitis B (hepatitis B vaccine).  You have HIV or AIDS.  You use needles to inject street drugs.  You live with someone who has hepatitis B.  You have had sex with someone who has hepatitis B.  You get hemodialysis treatment.  You take certain medicines for conditions, including cancer, organ transplantation, and autoimmune conditions. Hepatitis C  Blood testing is recommended for:  Everyone born from [birth year] through 1967.  Anyone with known risk factors for hepatitis C. Sexually transmitted infections (STIs)  You should be screened for sexually transmitted infections (STIs) including gonorrhea and chlamydia if:  You are sexually active and are younger than 71 years of age.  You are older than 71 years of age and your health care provider tells you that you are at risk for this type of infection.  Your sexual activity has changed since you were last screened and you are at an increased risk for chlamydia or gonorrhea. Ask your health care provider if you are at risk.  If you do not have HIV, but are at risk, it may be recommended that you  take a prescription medicine daily to prevent HIV infection. This is called pre-exposure prophylaxis (PrEP). You are considered at risk if:  You are sexually active and do not regularly use condoms or know the HIV status of your partner(s).  You take drugs by injection.  You are sexually active with a partner who has  HIV. Talk with your health care provider about whether you are at high risk of being infected with HIV. If you choose to begin PrEP, you should first be tested for HIV. You should then be tested every 3 months for as long as you are taking PrEP.  PREGNANCY   If you are premenopausal and you may become pregnant, ask your health care provider about preconception counseling.  If you may become pregnant, take 400 to 800 micrograms (mcg) of folic acid every day.  If you want to prevent pregnancy, talk to your health care provider about birth control (contraception). OSTEOPOROSIS AND MENOPAUSE   Osteoporosis is a disease in which the bones lose minerals and strength with aging. This can result in serious bone fractures. Your risk for osteoporosis can be identified using a bone density scan.  If you are 10 years of age or older, or if you are at risk for osteoporosis and fractures, ask your health care provider if you should be screened.  Ask your health care provider whether you should take a calcium or vitamin D supplement to lower your risk for osteoporosis.  Menopause may have certain physical symptoms and risks.  Hormone replacement therapy may reduce some of these symptoms and risks. Talk to your health care provider about whether hormone replacement therapy is right for you.  HOME CARE INSTRUCTIONS   Schedule regular health, dental, and eye exams.  Stay current with your immunizations.   Do not use any tobacco products including cigarettes, chewing tobacco, or electronic cigarettes.  If you are pregnant, do not drink alcohol.  If you are breastfeeding, limit how  much and how often you drink alcohol.  Limit alcohol intake to no more than 1 drink per day for nonpregnant women. One drink equals 12 ounces of beer, 5 ounces of wine, or 1 ounces of hard liquor.  Do not use street drugs.  Do not share needles.  Ask your health care provider for help if you need support or information about quitting drugs.  Tell your health care provider if you often feel depressed.  Tell your health care provider if you have ever been abused or do not feel safe at home.   This information is not intended to replace advice given to you by your health care provider. Make sure you discuss any questions you have with your health care provider.   Document Released: 07/29/2010 Document Revised: 02/03/2014 Document Reviewed: 12/15/2012 Elsevier Interactive Patient Education 2016 [place].   Diabetes and Standards of Medical Care Diabetes is complicated. You may find that your diabetes team includes a dietitian, nurse, diabetes educator, eye doctor, and more. To help everyone know what is going on and to help you get the care you deserve, the following schedule of care was developed to help keep you on track. Below are the tests, exams, vaccines, medicines, education, and plans you will need. HbA1c test This test shows how well you have controlled your glucose over the past 2-3 months. It is used to see if your diabetes management plan needs to be adjusted.   It is performed at least 2 times a year if you are meeting treatment goals.  It is performed 4 times a year if therapy has changed or if you are not meeting treatment goals. Blood pressure test  This test is performed at every routine medical visit. The goal is less than 140/90 mm Hg for most people, but 130/80 mm Hg in some cases. Ask your health care  provider about your goal. Dental exam  Follow up with the dentist regularly. Eye exam  If you are diagnosed with type 1 diabetes as a child, get an exam upon  reaching the age of 66 years or older and having had diabetes for 3-5 years. Yearly eye exams are recommended after that initial eye exam.  If you are diagnosed with type 1 diabetes as an adult, get an exam within 5 years of diagnosis and then yearly.  If you are diagnosed with type 2 diabetes, get an exam as soon as possible after the diagnosis and then yearly. Foot care exam  Visual foot exams are performed at every routine medical visit. The exams check for cuts, injuries, or other problems with the feet.  You should have a complete foot exam performed every year. This exam includes an inspection of the structure and skin of your feet, a check of the pulses in your feet, and a check of the sensation in your feet.  Type 1 diabetes: The first exam is performed 5 years after diagnosis.  Type 2 diabetes: The first exam is performed at the time of diagnosis.  Check your feet nightly for cuts, injuries, or other problems with your feet. Tell your health care provider if anything is not healing. Kidney function test (urine microalbumin)  This test is performed once a year.  Type 1 diabetes: The first test is performed 5 years after diagnosis.  Type 2 diabetes: The first test is performed at the time of diagnosis.  A serum creatinine and estimated glomerular filtration rate (eGFR) test is done once a year to assess the level of chronic kidney disease (CKD), if present. Lipid profile (cholesterol, HDL, LDL, triglycerides)  Performed every 5 years for most people.  The goal for LDL is less than 100 mg/dL. If you are at high risk, the goal is less than 70 mg/dL.  The goal for HDL is 40 mg/dL-50 mg/dL for men and 50 mg/dL-60 mg/dL for women. An HDL cholesterol of 60 mg/dL or higher gives some protection against heart disease.  The goal for triglycerides is less than 150 mg/dL. Immunizations  The flu (influenza) vaccine is recommended yearly for every person 69 months of age or older who has  diabetes.  The pneumonia (pneumococcal) vaccine is recommended for every person 50 years of age or older who has diabetes. Adults 44 years of age or older may receive the pneumonia vaccine as a series of two separate shots.  The hepatitis B vaccine is recommended for adults shortly after they have been diagnosed with diabetes.  The Tdap (tetanus, diphtheria, and pertussis) vaccine should be given:  According to normal childhood vaccination schedules, for children.  Every 10 years, for adults who have diabetes. Diabetes self-management education  Education is recommended at diagnosis and ongoing as needed. Treatment plan  Your treatment plan is reviewed at every medical visit.   This information is not intended to replace advice given to you by your health care provider. Make sure you discuss any questions you have with your health care provider.   Document Released: 11/10/2008 Document Revised: 02/03/2014 Document Reviewed: 06/15/2012 Elsevier Interactive Patient Education [employer].

## 2015-01-08 NOTE — Progress Notes (Signed)
Pre visit review using our clinic review tool, if applicable. No additional management support is needed unless otherwise documented below in the visit note.  Chief Complaint  Patient presents with  . Medicare Wellness  . Diabetes  . Hypertension  . Hypothyroidism    HPI: Rebecca Rich 69 y.o. comes in today for Preventive Medicare wellness visit .and Chronic disease management Since last visit. She required  uc visits for  Elevated bp  In October  Assoc with ha numbness swelling and bp was 202/90  ekg NA changes .   Saw Dr Einar Gip cards   Who inc her  cchlorthaladone to 25 bid  .  Had echo  Mod lvh nl eg diastolic dysf  midl as ai  Poss pfo  She confides that she knows why all this happened.    She stopped all meds for about 2 weeks   . Has been on meds since then .   Has recently doing sugar binges carbs and that is why BG is up. Seed ffoot doc  No new problems Still traveling  No current cp sob .  Health Maintenance  Topic Date Due  . DEXA SCAN  04/12/2008  . TETANUS/TDAP  01/27/2013  . FOOT EXAM  05/24/2013  . INFLUENZA VACCINE  01/14/2015 (Originally 08/28/2014)  . ZOSTAVAX  01/08/2016 (Originally 04/13/2003)  . PNA vac Low Risk Adult (2 of 2 - PCV13) 01/08/2016 (Originally 05/24/2013)  . OPHTHALMOLOGY EXAM  05/09/2015  . HEMOGLOBIN A1C  07/03/2015  . MAMMOGRAM  02/17/2016  . COLONOSCOPY  12/14/2022  . Hepatitis C Screening  Completed   Health Maintenance Review LIFESTYLE:  TADno Sugar beverages: some  Not recently  Sleep: 6 hours cpap prev dr Gwenette Greet     MEDICARE DOCUMENT QUESTIONS  TO SCAN     Hearing: ok  Vision:  No limitations at present . Last eye check UTD sees DR Rankin  Safety:  Has smoke detector and wears seat belts.  No firearms. No excess sun exposure. Sees dentist regularly.  Falls: n  Advance directive :  Reviewed  Has  Persons appointed just  not official   Memory: Felt to be good  , no concern from her or her family.  Depression: No  anhedonia unusual crying or depressive symptoms  Nutrition:  recent inc sweets and sugars adequate calcium and vitamin D. No swallowing chewing problems.  Injury: no major injuries in the last six months.  Other healthcare providers:  Reviewed today .  Social:  Lives  Alone travels a lot  No pets.   Preventive parameters: up-to-date  Reviewed   ADLS:   There are no problems or need for assistance  driving, feeding, obtaining food, dressing, toileting and bathing, managing money using phone. She is independent.    ROS: denies sob pnd  numbness GEN/ HEENT: No fever, significant weight changes sweats headaches vision problems hearing changes, CV/ PULM; No chest pain shortness of breath cough, syncope,edema  change in exercise tolerance. GI /GU: No adominal pain, vomiting, change in bowel habits. No blood in the stool. No significant GU symptoms. SKIN/HEME: ,no acute skin rashes suspicious lesions or bleeding. No lymphadenopathy, nodules, masses.  NEURO/ PSYCH:  No neurologic signs such as weakness numbness. No depression anxiety. IMM/ Allergy: No unusual infections.  Allergy .   REST of 12 system review negative except as per HPI   Past Medical History  Diagnosis Date  . DIABETES MELLITUS, TYPE II 12/08/2006  . HYPERLIPIDEMIA 12/08/2006  . OBESITY 09/24/2009  .  Morbid obesity ([place]) 12/08/2006  . OBSTRUCTIVE SLEEP APNEA 12/08/2006  . HYPERTENSION 12/08/2006  . Acute bronchospasm 08/24/2009  . INFECTION, SKIN AND SOFT TISSUE 07/28/2008  . 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 OSTEOARTHRITIS 12/08/2006  . SHOULDER PAIN, RIGHT 02/07/2008  . Swelling of limb 07/28/2008  . CARDIAC MURMUR 12/08/2006  . RUQ PAIN 06/16/2008  . THYROID FUNCTION TEST, ABNORMAL 12/08/2006  . LIVER FUNCTION TESTS, ABNORMAL 07/28/2008  . COLONIC POLYPS, HX OF 12/08/2006  . 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 Gallbladder/common duct stone, without infection, with obstruction 03/01/2010    removed ercp  . Acute sphenoidal sinusitis  02/26/2010    Qualifier: Diagnosis of  By: Regis Bill MD, Standley Brooking     Family History  Problem Relation Age of Onset  . Other Mother     blood clots  . Cervical cancer Mother   . Cancer Mother   . Heart disease Brother   . Diabetes Brother   . Heart disease Sister   . Diabetes Sister   . Heart disease Brother   . Diabetes Brother   . Heart disease Brother   . Diabetes Brother   . Stroke Sister   . Diabetes Sister   . Throat cancer Father   . Cancer Father   . Diabetes Other     all siblings, 4 brothers, 5 sisters    Social History   Social History  . Marital Status: Single    Spouse Name: N/A  . Number of Children: N/A  . Years of Education: N/A   Occupational History  . retired    Social History Main Topics  . Smoking status: Never Smoker   . Smokeless tobacco: Never Used  . Alcohol Use: No  . Drug Use: No  . Sexual Activity: Not Asked   Other Topics Concern  . None   Social History Narrative   Master level education in math   Pt is currently retired   Pt is divorced with children   Recently had to move had a break in and thus away from her pool exercise    Outpatient Encounter Prescriptions as of 01/08/2015  Medication Sig  . chlorthalidone (HYGROTON) 25 MG tablet take 1 tablet by mouth once daily (Patient taking differently: take 1 tablet by mouth bid)  . cholecalciferol (VITAMIN D) 1000 UNITS tablet Take 1,000 Units by mouth daily.  Marland Kitchen glucose blood (ACCU-CHEK AVIVA PLUS) test strip Use as instructed  . Misc Natural Products (OSTEO BI-FLEX ADV JOINT SHIELD PO) Take 1 tablet by mouth daily.  . Multiple Vitamins-Minerals (CENTRUM SILVER PO) Take 1 tablet by mouth daily.    . potassium chloride (K-DUR,KLOR-CON) 10 MEQ tablet TAKE 2 TABLETS BY MOUTH DAILY  . ramipril (ALTACE) 10 MG capsule take 1 capsule by mouth twice a day  . [DISCONTINUED] levothyroxine (SYNTHROID, LEVOTHROID) 50 MCG tablet Take 1 tablet (50 mcg total) by mouth daily.  Marland Kitchen levothyroxine  (SYNTHROID) 75 MCG tablet Take 1 tablet (75 mcg total) by mouth daily before breakfast.  . [DISCONTINUED] ramipril (ALTACE) 10 MG capsule Take 2 capsules (20 mg total) by mouth daily.   No facility-administered encounter medications on file as of 01/08/2015.    EXAM:  BP 132/60 mmHg  Temp(Src) 98.8 F (37.1 C) (Oral)  Ht 5' 1.5" (1.562 m)  Wt 289 lb 3.2 oz (131.18 kg)  BMI 53.77 kg/m2  Body mass index is 53.77 kg/(m^2).  Physical Exam: Vital signs reviewed IWP:YKDX is a well-developed well-nourished alert cooperative   who appears stated age  in no acute distress.  HEENT: normocephalic atraumatic , Eyes: PERRL EOM's full, conjunctiva clear, Nares: paten,t no deformity discharge or tenderness., Ears: no deformity EAC's clear TMs with normal landmarks. Mouth: clear OP, no lesions, edema.  Moist mucous membranes. Dentition in adequate repair. NECK: supple without masses, thyromegaly or bruits. CHEST/PULM:  Clear to auscultation and percussion breath sounds equal no wheeze , rales or rhonchi. Breast: normal by inspection . No dimpling, discharge, masses, tenderness or discharge . CV: PMI is nondisplaced, S1 S2 no gallops, 2-3 /6 systolic m at usb inc with supine  Not in neck  no rubs. Peripheral pulses are full without delay.No JVD .  ABDOMEN: Bowel sounds normal nontender  No guard or rebound, no hepato splenomegal no CVA tenderness.   Extremtities:  No clubbing cyanosis slight  edema, no acute joint swelling or redness no focal atrophy NEURO:  Oriented x3, cranial nerves 3-12 appear to be intact, no obvious focal weakness,gait within normal limits no abnormal reflexes or asymmetrical SKIN: No acute rashes normal turgor, color, no bruising or petechiae. Has  Keloids  PSYCH: Oriented, good eye contact, no obvious depression anxiety, cognition and judgment appear normal. LN: no cervical axillary inguinal adenopathy No noted deficits in memory, attention, and speech.  Diabetic Foot Exam -  Simple   Simple Foot Form  Diabetic Foot exam was performed with the following findings:  Yes 01/08/2015 12:12 PM  Visual Inspection  No deformities, no ulcerations, no other skin breakdown bilaterally:  Yes  See comments:  Yes  Sensation Testing  Intact to touch and monofilament testing bilaterally:  Yes  Pulse Check  Posterior Tibialis and Dorsalis pulse intact bilaterally:  Yes  Comments  Some midl callus  Below great metatarsal  No ulcers some scaling   No redness      Lab Results  Component Value Date   WBC 5.4 02/23/2012   HGB 12.8 02/23/2012   HCT 38.0 02/23/2012   PLT 140* 02/23/2012   GLUCOSE 143* 01/02/2015   CHOL 144 01/02/2015   TRIG 131.0 01/02/2015   HDL 45.30 01/02/2015   LDLCALC 72 01/02/2015   ALT 11 10/31/2014   AST 12 10/31/2014   NA 138 01/02/2015   K 3.6 01/02/2015   CL 104 01/02/2015   CREATININE 1.14 01/02/2015   BUN 31* 01/02/2015   CO2 25 01/02/2015   TSH 5.50* 01/02/2015   HGBA1C 7.2* 01/02/2015   MICROALBUR 0.4 08/02/2009   Wt Readings from Last 3 Encounters:  01/08/15 289 lb 3.2 oz (131.18 kg)  10/31/14 294 lb (133.358 kg)  08/01/14 285 lb 1.6 oz (129.321 kg)   BP Readings from Last 3 Encounters:  01/08/15 132/60  10/31/14 184/77  08/16/14 133/60    ASSESSMENT AND PLAN:  Discussed the following assessment and plan:  Medicare annual wellness visit, subsequent  Essential hypertension - lvh and  mild as ai  on echo poss pfo   Type 2 diabetes mellitus without complication, without long-term current use of insulin (HCC)  Hypothyroidism, unspecified hypothyroidism type - tsh 5 today  inc replacment   Morbid obesity, unspecified obesity type ([place])  Obstructive sleep apnea  Patient Care Team: Burnis Medin, MD as PCP - General Alphonsa Overall, MD (General Surgery) Carol Ada, MD (Gastroenterology) Kathee Delton, MD (Pulmonary Disease) Gean Birchwood, DPM (Podiatry) Adrian Prows, MD as Consulting Physician (Cardiology) Juanita Craver, MD as Consulting Physician (Gastroenterology) Hurman Horn, MD as Consulting Physician (Ophthalmology)  Patient Instructions  Increase  the thyroid med to 75 per day and plan fu .  tsh   In 2-3 months . Sugars are worse .  And   Respect your wishes to  Not add medication   Plan hga1c with thryoid check  in 3 months . And FU  Please fu with cardiology  As  They advised for long term   Fu plans  .   Health Maintenance, Female Adopting a healthy lifestyle and getting preventive care can go a long way to promote health and wellness. Talk with your health care provider about what schedule of regular examinations is right for you. This is a good chance for you to check in with your provider about disease prevention and staying healthy. In between checkups, there are plenty of things you can do on your own. Experts have done a lot of research about which lifestyle changes and preventive measures are most likely to keep you healthy. Ask your health care provider for more information. WEIGHT AND DIET  Eat a healthy diet  Be sure to include plenty of vegetables, fruits, low-fat dairy products, and lean protein.  Do not eat a lot of foods high in solid fats, added sugars, or salt.  Get regular exercise. This is one of the most important things you can do for your health.  Most adults should exercise for at least 150 minutes each week. The exercise should increase your heart rate and make you sweat (moderate-intensity exercise).  Most adults should also do strengthening exercises at least twice a week. This is in addition to the moderate-intensity exercise.  Maintain a healthy weight  Body mass index (BMI) is a measurement that can be used to identify possible weight problems. It estimates body fat based on height and weight. Your health care provider can help determine your BMI and help you achieve or maintain a healthy weight.  For females 33 years of age and older:   A BMI below 18.5  is considered underweight.  A BMI of 18.5 to 24.9 is normal.  A BMI of 25 to 29.9 is considered overweight.  A BMI of 30 and above is considered obese.  Watch levels of cholesterol and blood lipids  You should start having your blood tested for lipids and cholesterol at 71 years of age, then have this test every 5 years.  You may need to have your cholesterol levels checked more often if:  Your lipid or cholesterol levels are high.  You are older than 71 years of age.  You are at high risk for heart disease.  CANCER SCREENING   Lung Cancer  Lung cancer screening is recommended for adults 65-77 years old who are at high risk for lung cancer because of a history of smoking.  A yearly low-dose CT scan of the lungs is recommended for people who:  Currently smoke.  Have quit within the past 15 years.  Have at least a 30-pack-year history of smoking. A pack year is smoking an average of one pack of cigarettes a day for 1 year.  Yearly screening should continue until it has been 15 years since you quit.  Yearly screening should stop if you develop a health problem that would prevent you from having lung cancer treatment.  Breast Cancer  Practice breast self-awareness. This means understanding how your breasts normally appear and feel.  It also means doing regular breast self-exams. Let your health care provider know about any changes, no matter how small.  If you are in  your 20s or 30s, you should have a clinical breast exam (CBE) by a health care provider every 1-3 years as part of a regular health exam.  If you are 83 or older, have a CBE every year. Also consider having a breast X-ray (mammogram) every year.  If you have a family history of breast cancer, talk to your health care provider about genetic screening.  If you are at high risk for breast cancer, talk to your health care provider about having an MRI and a mammogram every year.  Breast cancer gene (BRCA)  assessment is recommended for women who have family members with BRCA-related cancers. BRCA-related cancers include:  Breast.  Ovarian.  Tubal.  Peritoneal cancers.  Results of the assessment will determine the need for genetic counseling and BRCA1 and BRCA2 testing. Cervical Cancer Your health care provider may recommend that you be screened regularly for cancer of the pelvic organs (ovaries, uterus, and vagina). This screening involves a pelvic examination, including checking for microscopic changes to the surface of your cervix (Pap test). You may be encouraged to have this screening done every 3 years, beginning at age 58.  For women ages 46-65, health care providers may recommend pelvic exams and Pap testing every 3 years, or they may recommend the Pap and pelvic exam, combined with testing for human papilloma virus (HPV), every 5 years. Some types of HPV increase your risk of cervical cancer. Testing for HPV may also be done on women of any age with unclear Pap test results.  Other health care providers may not recommend any screening for nonpregnant women who are considered low risk for pelvic cancer and who do not have symptoms. Ask your health care provider if a screening pelvic exam is right for you.  If you have had past treatment for cervical cancer or a condition that could lead to cancer, you need Pap tests and screening for cancer for at least 20 years after your treatment. If Pap tests have been discontinued, your risk factors (such as having a new sexual partner) need to be reassessed to determine if screening should resume. Some women have medical problems that increase the chance of getting cervical cancer. In these cases, your health care provider may recommend more frequent screening and Pap tests. Colorectal Cancer  This type of cancer can be detected and often prevented.  Routine colorectal cancer screening usually begins at 71 years of age and continues through 71 years  of age.  Your health care provider may recommend screening at an earlier age if you have risk factors for colon cancer.  Your health care provider may also recommend using home test kits to check for hidden blood in the stool.  A small camera at the end of a tube can be used to examine your colon directly (sigmoidoscopy or colonoscopy). This is done to check for the earliest forms of colorectal cancer.  Routine screening usually begins at age 21.  Direct examination of the colon should be repeated every 5-10 years through 71 years of age. However, you may need to be screened more often if early forms of precancerous polyps or small growths are found. Skin Cancer  Check your skin from head to toe regularly.  Tell your health care provider about any new moles or changes in moles, especially if there is a change in a mole's shape or color.  Also tell your health care provider if you have a mole that is larger than the size of a pencil  eraser.  Always use sunscreen. Apply sunscreen liberally and repeatedly throughout the day.  Protect yourself by wearing long sleeves, pants, a wide-brimmed hat, and sunglasses whenever you are outside. HEART DISEASE, DIABETES, AND HIGH BLOOD PRESSURE   High blood pressure causes heart disease and increases the risk of stroke. High blood pressure is more likely to develop in:  People who have blood pressure in the high end of the normal range (130-139/85-89 mm Hg).  People who are overweight or obese.  People who are African American.  If you are 58-74 years of age, have your blood pressure checked every 3-5 years. If you are 83 years of age or older, have your blood pressure checked every year. You should have your blood pressure measured twice--once when you are at a hospital or clinic, and once when you are not at a hospital or clinic. Record the average of the two measurements. To check your blood pressure when you are not at a hospital or clinic, you  can use:  An automated blood pressure machine at a pharmacy.  A home blood pressure monitor.  If you are between 35 years and 22 years old, ask your health care provider if you should take aspirin to prevent strokes.  Have regular diabetes screenings. This involves taking a blood sample to check your fasting blood sugar level.  If you are at a normal weight and have a low risk for diabetes, have this test once every three years after 71 years of age.  If you are overweight and have a high risk for diabetes, consider being tested at a younger age or more often. PREVENTING INFECTION  Hepatitis B  If you have a higher risk for hepatitis B, you should be screened for this virus. You are considered at high risk for hepatitis B if:  You were born in a country where hepatitis B is common. Ask your health care provider which countries are considered high risk.  Your parents were born in a high-risk country, and you have not been immunized against hepatitis B (hepatitis B vaccine).  You have HIV or AIDS.  You use needles to inject street drugs.  You live with someone who has hepatitis B.  You have had sex with someone who has hepatitis B.  You get hemodialysis treatment.  You take certain medicines for conditions, including cancer, organ transplantation, and autoimmune conditions. Hepatitis C  Blood testing is recommended for:  Everyone born from [birth year] through 1967.  Anyone with known risk factors for hepatitis C. Sexually transmitted infections (STIs)  You should be screened for sexually transmitted infections (STIs) including gonorrhea and chlamydia if:  You are sexually active and are younger than 71 years of age.  You are older than 71 years of age and your health care provider tells you that you are at risk for this type of infection.  Your sexual activity has changed since you were last screened and you are at an increased risk for chlamydia or gonorrhea. Ask your health  care provider if you are at risk.  If you do not have HIV, but are at risk, it may be recommended that you take a prescription medicine daily to prevent HIV infection. This is called pre-exposure prophylaxis (PrEP). You are considered at risk if:  You are sexually active and do not regularly use condoms or know the HIV status of your partner(s).  You take drugs by injection.  You are sexually active with a partner who has HIV. Talk with  your health care provider about whether you are at high risk of being infected with HIV. If you choose to begin PrEP, you should first be tested for HIV. You should then be tested every 3 months for as long as you are taking PrEP.  PREGNANCY   If you are premenopausal and you may become pregnant, ask your health care provider about preconception counseling.  If you may become pregnant, take 400 to 800 micrograms (mcg) of folic acid every day.  If you want to prevent pregnancy, talk to your health care provider about birth control (contraception). OSTEOPOROSIS AND MENOPAUSE   Osteoporosis is a disease in which the bones lose minerals and strength with aging. This can result in serious bone fractures. Your risk for osteoporosis can be identified using a bone density scan.  If you are 64 years of age or older, or if you are at risk for osteoporosis and fractures, ask your health care provider if you should be screened.  Ask your health care provider whether you should take a calcium or vitamin D supplement to lower your risk for osteoporosis.  Menopause may have certain physical symptoms and risks.  Hormone replacement therapy may reduce some of these symptoms and risks. Talk to your health care provider about whether hormone replacement therapy is right for you.  HOME CARE INSTRUCTIONS   Schedule regular health, dental, and eye exams.  Stay current with your immunizations.   Do not use any tobacco products including cigarettes, chewing tobacco, or  electronic cigarettes.  If you are pregnant, do not drink alcohol.  If you are breastfeeding, limit how much and how often you drink alcohol.  Limit alcohol intake to no more than 1 drink per day for nonpregnant women. One drink equals 12 ounces of beer, 5 ounces of wine, or 1 ounces of hard liquor.  Do not use street drugs.  Do not share needles.  Ask your health care provider for help if you need support or information about quitting drugs.  Tell your health care provider if you often feel depressed.  Tell your health care provider if you have ever been abused or do not feel safe at home.   This information is not intended to replace advice given to you by your health care provider. Make sure you discuss any questions you have with your health care provider.   Document Released: 07/29/2010 Document Revised: 02/03/2014 Document Reviewed: 12/15/2012 Elsevier Interactive Patient Education 2016 [place].   Diabetes and Standards of Medical Care Diabetes is complicated. You may find that your diabetes team includes a dietitian, nurse, diabetes educator, eye doctor, and more. To help everyone know what is going on and to help you get the care you deserve, the following schedule of care was developed to help keep you on track. Below are the tests, exams, vaccines, medicines, education, and plans you will need. HbA1c test This test shows how well you have controlled your glucose over the past 2-3 months. It is used to see if your diabetes management plan needs to be adjusted.   It is performed at least 2 times a year if you are meeting treatment goals.  It is performed 4 times a year if therapy has changed or if you are not meeting treatment goals. Blood pressure test  This test is performed at every routine medical visit. The goal is less than 140/90 mm Hg for most people, but 130/80 mm Hg in some cases. Ask your health care provider about your  goal. Dental exam  Follow up with  the dentist regularly. Eye exam  If you are diagnosed with type 1 diabetes as a child, get an exam upon reaching the age of 1 years or older and having had diabetes for 3-5 years. Yearly eye exams are recommended after that initial eye exam.  If you are diagnosed with type 1 diabetes as an adult, get an exam within 5 years of diagnosis and then yearly.  If you are diagnosed with type 2 diabetes, get an exam as soon as possible after the diagnosis and then yearly. Foot care exam  Visual foot exams are performed at every routine medical visit. The exams check for cuts, injuries, or other problems with the feet.  You should have a complete foot exam performed every year. This exam includes an inspection of the structure and skin of your feet, a check of the pulses in your feet, and a check of the sensation in your feet.  Type 1 diabetes: The first exam is performed 5 years after diagnosis.  Type 2 diabetes: The first exam is performed at the time of diagnosis.  Check your feet nightly for cuts, injuries, or other problems with your feet. Tell your health care provider if anything is not healing. Kidney function test (urine microalbumin)  This test is performed once a year.  Type 1 diabetes: The first test is performed 5 years after diagnosis.  Type 2 diabetes: The first test is performed at the time of diagnosis.  A serum creatinine and estimated glomerular filtration rate (eGFR) test is done once a year to assess the level of chronic kidney disease (CKD), if present. Lipid profile (cholesterol, HDL, LDL, triglycerides)  Performed every 5 years for most people.  The goal for LDL is less than 100 mg/dL. If you are at high risk, the goal is less than 70 mg/dL.  The goal for HDL is 40 mg/dL-50 mg/dL for men and 50 mg/dL-60 mg/dL for women. An HDL cholesterol of 60 mg/dL or higher gives some protection against heart disease.  The goal for triglycerides is less than 150  mg/dL. Immunizations  The flu (influenza) vaccine is recommended yearly for every person 81 months of age or older who has diabetes.  The pneumonia (pneumococcal) vaccine is recommended for every person 69 years of age or older who has diabetes. Adults 41 years of age or older may receive the pneumonia vaccine as a series of two separate shots.  The hepatitis B vaccine is recommended for adults shortly after they have been diagnosed with diabetes.  The Tdap (tetanus, diphtheria, and pertussis) vaccine should be given:  According to normal childhood vaccination schedules, for children.  Every 10 years, for adults who have diabetes. Diabetes self-management education  Education is recommended at diagnosis and ongoing as needed. Treatment plan  Your treatment plan is reviewed at every medical visit.   This information is not intended to replace advice given to you by your health care provider. Make sure you discuss any questions you have with your health care provider.   Document Released: 11/10/2008 Document Revised: 02/03/2014 Document Reviewed: 06/15/2012 Elsevier Interactive Patient Education 2016 [place]. Demoni Gergen M.D.

## 2015-01-24 ENCOUNTER — Encounter: Payer: Self-pay | Admitting: *Deleted

## 2015-03-02 ENCOUNTER — Other Ambulatory Visit: Payer: Self-pay | Admitting: *Deleted

## 2015-03-02 MED ORDER — LEVOTHYROXINE SODIUM 75 MCG PO TABS: 75.0000 ug | ORAL_TABLET | Freq: Every day | ORAL | Status: DC

## 2015-03-16 ENCOUNTER — Other Ambulatory Visit: Payer: Self-pay | Admitting: Family Medicine

## 2015-03-16 MED ORDER — RAMIPRIL 10 MG PO CAPS: 10.0000 mg | ORAL_CAPSULE | Freq: Two times a day (BID) | ORAL | Status: DC

## 2015-03-16 NOTE — Telephone Encounter (Signed)
Sent to the pharmacy by e-scribe.  Scheduled for follow up on 04/10/15 and lab work on 04/03/15.

## 2015-03-28 DIAGNOSIS — L03012 Cellulitis of left finger: Secondary | ICD-10-CM | POA: Diagnosis not present

## 2015-04-01 DIAGNOSIS — L03012 Cellulitis of left finger: Secondary | ICD-10-CM | POA: Diagnosis not present

## 2015-04-02 ENCOUNTER — Other Ambulatory Visit: Payer: Self-pay | Admitting: Family Medicine

## 2015-04-02 DIAGNOSIS — E119 Type 2 diabetes mellitus without complications: Secondary | ICD-10-CM

## 2015-04-02 DIAGNOSIS — I10 Essential (primary) hypertension: Secondary | ICD-10-CM

## 2015-04-02 DIAGNOSIS — E039 Hypothyroidism, unspecified: Secondary | ICD-10-CM

## 2015-04-03 ENCOUNTER — Other Ambulatory Visit (INDEPENDENT_AMBULATORY_CARE_PROVIDER_SITE_OTHER): Payer: Medicare Other

## 2015-04-03 DIAGNOSIS — I10 Essential (primary) hypertension: Secondary | ICD-10-CM | POA: Diagnosis not present

## 2015-04-03 DIAGNOSIS — E039 Hypothyroidism, unspecified: Secondary | ICD-10-CM

## 2015-04-03 DIAGNOSIS — E119 Type 2 diabetes mellitus without complications: Secondary | ICD-10-CM | POA: Diagnosis not present

## 2015-04-03 LAB — TSH: TSH: 2.71 u[IU]/mL (ref 0.35–4.50)

## 2015-04-03 LAB — BASIC METABOLIC PANEL
BUN: 30 mg/dL — ABNORMAL HIGH (ref 6–23)
CALCIUM: 9.4 mg/dL (ref 8.4–10.5)
CHLORIDE: 105 meq/L (ref 96–112)
CO2: 23 meq/L (ref 19–32)
Creatinine, Ser: 1.06 mg/dL (ref 0.40–1.20)
GFR: 65.54 mL/min (ref >60.00)
GLUCOSE: 139 mg/dL — AB (ref 70–99)
Potassium: 3.9 mEq/L (ref 3.5–5.1)
SODIUM: 140 meq/L (ref 135–145)

## 2015-04-03 LAB — HEMOGLOBIN A1C: Hgb A1c MFr Bld: 7.1 % — ABNORMAL HIGH (ref 4.6–6.5)

## 2015-04-06 DIAGNOSIS — L03012 Cellulitis of left finger: Secondary | ICD-10-CM | POA: Diagnosis not present

## 2015-04-10 ENCOUNTER — Ambulatory Visit: Payer: Medicare Other | Admitting: Internal Medicine

## 2015-04-10 DIAGNOSIS — E119 Type 2 diabetes mellitus without complications: Secondary | ICD-10-CM | POA: Diagnosis not present

## 2015-04-10 DIAGNOSIS — H524 Presbyopia: Secondary | ICD-10-CM | POA: Diagnosis not present

## 2015-04-10 DIAGNOSIS — H52223 Regular astigmatism, bilateral: Secondary | ICD-10-CM | POA: Diagnosis not present

## 2015-04-10 DIAGNOSIS — H25813 Combined forms of age-related cataract, bilateral: Secondary | ICD-10-CM | POA: Diagnosis not present

## 2015-04-10 DIAGNOSIS — H5203 Hypermetropia, bilateral: Secondary | ICD-10-CM | POA: Diagnosis not present

## 2015-04-10 DIAGNOSIS — Z1231 Encounter for screening mammogram for malignant neoplasm of breast: Secondary | ICD-10-CM | POA: Diagnosis not present

## 2015-04-10 LAB — HM MAMMOGRAPHY: HM Mammogram: NORMAL

## 2015-04-10 LAB — HM DIABETES EYE EXAM

## 2015-04-11 ENCOUNTER — Other Ambulatory Visit: Payer: Self-pay | Admitting: Internal Medicine

## 2015-04-11 ENCOUNTER — Encounter: Payer: Self-pay | Admitting: Family Medicine

## 2015-04-14 DIAGNOSIS — L03012 Cellulitis of left finger: Secondary | ICD-10-CM | POA: Diagnosis not present

## 2015-04-16 ENCOUNTER — Encounter: Payer: Self-pay | Admitting: Family Medicine

## 2015-04-16 NOTE — Progress Notes (Signed)
Pre visit review using our clinic review tool, if applicable. No additional management support is needed unless otherwise documented below in the visit note.  Chief Complaint  Patient presents with  . Follow-up    finger swellin  uc rx  . Diabetes  . Hypertension    HPI: Rebecca Rich 70 y.o.  Chronic disease management DM not attnding at this time   Travel  Knows she is not doing as well .   BP has been ok so far  Left middle finger rx x 2 urgent care for insect bite and infection on antibiotic    Improved  But still   Dec rom and  Pain   Helped  Some   And then  New set   For  week.  To refill.  On doxy began with itchy spot has fu visit soon.  ROS: See pertinent positives and negatives per HPI. osa stable  Dr sellers eye  Past Medical History  Diagnosis Date  . DIABETES MELLITUS, TYPE II 12/08/2006  . HYPERLIPIDEMIA 12/08/2006  . OBESITY 09/24/2009  . Morbid obesity ([place]) 12/08/2006  . OBSTRUCTIVE SLEEP APNEA 12/08/2006  . HYPERTENSION 12/08/2006  . Acute bronchospasm 08/24/2009  . INFECTION, SKIN AND SOFT TISSUE 07/28/2008  . 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 OSTEOARTHRITIS 12/08/2006  . SHOULDER PAIN, RIGHT 02/07/2008  . Swelling of limb 07/28/2008  . CARDIAC MURMUR 12/08/2006  . RUQ PAIN 06/16/2008  . THYROID FUNCTION TEST, ABNORMAL 12/08/2006  . LIVER FUNCTION TESTS, ABNORMAL 07/28/2008  . COLONIC POLYPS, HX OF 12/08/2006  . 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 Gallbladder/common duct stone, without infection, with obstruction 03/01/2010    removed ercp  . Acute sphenoidal sinusitis 02/26/2010    Qualifier: Diagnosis of  By: Regis Bill MD, Standley Brooking   . Sleep apnea     Family History  Problem Relation Age of Onset  . Other Mother     blood clots  . Cervical cancer Mother   . Cancer Mother   . Heart disease Brother   . Diabetes Brother   . Heart disease Sister   . Diabetes Sister   . Heart disease Brother   . Diabetes Brother   . Heart disease Brother   . Diabetes Brother   . Stroke  Sister   . Diabetes Sister   . Throat cancer Father   . Cancer Father   . Diabetes Other     all siblings, 4 brothers, 5 sisters    Social History   Social History  . Marital Status: Single    Spouse Name: N/A  . Number of Children: N/A  . Years of Education: N/A   Occupational History  . retired    Social History Main Topics  . Smoking status: Never Smoker   . Smokeless tobacco: Never Used  . Alcohol Use: No  . Drug Use: No  . Sexual Activity: Not Asked   Other Topics Concern  . None   Social History Narrative   Master level education in math   Pt is currently retired   Pt is divorced with children   Recently had to move had a break in and thus away from her pool exercise    Outpatient Prescriptions Prior to Visit  Medication Sig Dispense Refill  . chlorthalidone (HYGROTON) 25 MG tablet take 1 tablet by mouth once daily (Patient taking differently: take 1 tablet by mouth bid) 90 tablet 0  . cholecalciferol (VITAMIN D) 1000 UNITS tablet Take 1,000 Units by mouth daily.    Marland Kitchen  glucose blood (ACCU-CHEK AVIVA PLUS) test strip Use as instructed 100 each 12  . levothyroxine (SYNTHROID) 75 MCG tablet Take 1 tablet (75 mcg total) by mouth daily before breakfast. 90 tablet 0  . Misc Natural Products (OSTEO BI-FLEX ADV JOINT SHIELD PO) Take 1 tablet by mouth daily.    . Multiple Vitamins-Minerals (CENTRUM SILVER PO) Take 1 tablet by mouth daily.      . potassium chloride (K-DUR,KLOR-CON) 10 MEQ tablet TAKE 2 TABLETS BY MOUTH DAILY 180 tablet 1  . ramipril (ALTACE) 10 MG capsule Take 1 capsule (10 mg total) by mouth 2 (two) times daily. 180 capsule 1   No facility-administered medications prior to visit.     EXAM:  BP 140/70 mmHg  Temp(Src) 99.2 F (37.3 C) (Oral)  Ht 5' 1.5" (1.562 m)  Wt 293 lb 11.2 oz (133.221 kg)  BMI 54.60 kg/m2  Body mass index is 54.6 kg/(m^2).  GENERAL: vitals reviewed and listed above, alert, oriented, appears well hydrated and in no acute  distress HEENT: atraumatic, conjunctiva  clear, no obvious abnormalities on inspection of external nose and earsLUNGS: clear to auscultation bilaterally, no wheezes, rales or rhonchi, good air movement CV: HRRR, no clubbing cyanosis or  peripheral edema nl cap refill  MS: moves all extremities  With left middle finger 2+ swollne but not hot min redness  rom limited but swelling? PSYCH: pleasant and cooperative, no obvious depression or anxiety Lab Results  Component Value Date   WBC 5.4 02/23/2012   HGB 12.8 02/23/2012   HCT 38.0 02/23/2012   PLT 140* 02/23/2012   GLUCOSE 139* 04/03/2015   CHOL 144 01/02/2015   TRIG 131.0 01/02/2015   HDL 45.30 01/02/2015   LDLCALC 72 01/02/2015   ALT 11 10/31/2014   AST 12 10/31/2014   NA 140 04/03/2015   K 3.9 04/03/2015   CL 105 04/03/2015   CREATININE 1.06 04/03/2015   BUN 30* 04/03/2015   CO2 23 04/03/2015   TSH 2.71 04/03/2015   HGBA1C 7.1* 04/03/2015   MICROALBUR 0.4 08/02/2009   BP Readings from Last 3 Encounters:  04/17/15 140/70  01/08/15 132/60  10/31/14 184/77   Wt Readings from Last 3 Encounters:  04/20/15 294 lb (133.358 kg)  04/17/15 293 lb 11.2 oz (133.221 kg)  01/08/15 289 lb 3.2 oz (131.18 kg)     ASSESSMENT AND PLAN:  Discussed the following assessment and plan:  Type 2 diabetes mellitus without complication, without long-term current use of insulin ([place]) - Plan: Amb ref to Medical Nutrition Therapy-MNT  Essential hypertension - bp 130 at urgent care    Hypothyroidism, unspecified hypothyroidism type  Morbid obesity, unspecified obesity type ([place]) - Plan: Amb ref to Medical Nutrition Therapy-MNT  Swelling of finger, left - rx per uc with antibiotic  for infecction improved but still dec rom began like itchy bite 2 weeksa go ?consdier hand referral opin if continues Disc strategies at length about her  Dm whild avoiding meds  Also finger   Amenable to nutrition consult also  -Patient advised to return or notify  health care team  if symptoms worsen ,persist or new concerns arise. Total visit 75mins > 50% spent counseling and coordinating care as indicated in above note and in instructions to patient .  Patient Instructions  If finger not getting better contact us and  We can proceed with hand referral.  Diabetes acceptable but   Would   Want this to be better.   Goal.  bp should be  Below 140/90 . Will  Do referral for nutition in the interim.   ROV  4 months  Hg a1c pre visit       Standley Brooking. Keilany Burnette M.D.

## 2015-04-17 ENCOUNTER — Ambulatory Visit (INDEPENDENT_AMBULATORY_CARE_PROVIDER_SITE_OTHER): Payer: Medicare Other | Admitting: Internal Medicine

## 2015-04-17 ENCOUNTER — Encounter: Payer: Self-pay | Admitting: Internal Medicine

## 2015-04-17 VITALS — BP 140/70 | Temp 99.2°F | Ht 61.5 in | Wt 293.7 lb

## 2015-04-17 DIAGNOSIS — E039 Hypothyroidism, unspecified: Secondary | ICD-10-CM | POA: Diagnosis not present

## 2015-04-17 DIAGNOSIS — M7989 Other specified soft tissue disorders: Secondary | ICD-10-CM

## 2015-04-17 DIAGNOSIS — E119 Type 2 diabetes mellitus without complications: Secondary | ICD-10-CM

## 2015-04-17 DIAGNOSIS — I10 Essential (primary) hypertension: Secondary | ICD-10-CM | POA: Diagnosis not present

## 2015-04-17 NOTE — Patient Instructions (Signed)
If finger not getting better contact us and  We can proceed with hand referral.  Diabetes acceptable but   Would   Want this to be better.   Goal.  bp should be   Below 140/90 . Will  Do referral for nutition in the interim.   ROV  4 months  Hg a1c pre visit

## 2015-04-18 ENCOUNTER — Other Ambulatory Visit: Payer: Self-pay | Admitting: *Deleted

## 2015-04-19 ENCOUNTER — Other Ambulatory Visit: Payer: Self-pay | Admitting: Internal Medicine

## 2015-04-19 NOTE — Telephone Encounter (Signed)
Pt request refill of the following:  potassium chloride (K-DUR,KLOR-CON) 10 MEQ tablet , chlorthalidone (HYGROTON) 25 MG tablet   Phamacy:  [facility]

## 2015-04-20 ENCOUNTER — Encounter: Payer: Medicare Other | Attending: Internal Medicine | Admitting: Dietician

## 2015-04-20 ENCOUNTER — Encounter: Payer: Self-pay | Admitting: Dietician

## 2015-04-20 VITALS — Ht 63.0 in | Wt 294.0 lb

## 2015-04-20 DIAGNOSIS — E119 Type 2 diabetes mellitus without complications: Secondary | ICD-10-CM | POA: Insufficient documentation

## 2015-04-20 NOTE — Patient Instructions (Signed)
Continue to keep your food journal. Attempt a normal routine as much as possible when you are not traveling. Resume some form of exercise most days of the week. Walking is great!  Go to the mailbox daily.  "Superman pose" to help avoid excuses. When you travel try:  [employer] bread with almond butter and fresh fruit for breakfast OR Plain oatmeal and fruit Be mindful about what you are choosing when you are eating out.  Google [employer] and nutritional information to get information on the restaurant's food OR use https://www.lee.info/ Eat slowly and stop when you are full. Be careful with added fats. Small amount of protein with each meal and snack.  Aim for 2-3 Carb Choices per meal (30-45 grams) +/- 1 either way  Aim for 0-1 Carbs per snack if hungry  Include protein in moderation with your meals and snacks Consider reading food labels for Total Carbohydrate and Fat Grams of foods

## 2015-04-20 NOTE — Telephone Encounter (Signed)
Dont know  I assumed   Back to qd    I didn't personally ask her   ( it was uinc temporarily  When she was seen in  urgen care for elevaetd bp )  Send in the qd dosing   In the interim  BP Readings from Last 3 Encounters:  04/17/15 140/70  01/08/15 132/60  10/31/14 184/77

## 2015-04-20 NOTE — Telephone Encounter (Signed)
Is pt taking 1 or two of the chlorthalidone?

## 2015-04-20 NOTE — Progress Notes (Signed)
Medical Nutrition Therapy:  Appt start time: 1030 end time:  1200.   Assessment:  Primary concerns today: Patient is here alone.  She would like to learn "what she is doing wrong that has cause weight gain."  She states that her routine changed and she stopped eating regularly and exercising due to traveling 2-3 times per month.  She travels alone (flying or driving) due to volunteer work.  She wants to lose 30 lbs in the next 4 months.  Her last HgbA1C was 7.1% 04/03/15 which has increased from 6.5% 07/25/14.  She uses cc-pap for sleep apnea.  Highest weight 320 lbs in 2001 and lost to 270 lbs in 2002 and has increased in the past year to 294 lbs.  Patient lives alone.  She does her own shopping and cooks simply or eats out.  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  Recently, she has been traveling a lot for them.  Since this her meal schedule and exercise routine have been poor.  When she is supervising a conference, she is there from 7 am-midnight and has little time to eat.  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    Preferred Learning Style:   No preference indicated   Learning Readiness:   Ready  Change in progress  MEDICATIONS: see list   DIETARY INTAKE: 1 meal per day until she saw Dr. Zack Seal Tuesday  24-hr recall:  B (7-12AM): 1/2 cup irish oatmeal, peaches, strawberries, blueberries,  with 1 T honey and 1/2 T butter, 1/2 cup LF cottage cheese  Snk ( AM): none  L ( PM): sometimes skips or fruit or meat sandwich, raw veges and fruit Snk ( PM): occasional nuts D ( PM): salad with meat and ranch OR [employer] Snk ( PM): none Beverages: water and unsweetened tea and regular lemonade once per week  Usual physical activity: used to do a water aerobics class and another class but has not been doing this since she has begun traveling.  ADL's now.  Estimated energy needs: 1400 calories 158 g carbohydrates 88 g protein 47 g  fat  Progress Towards Goal(s):  In progress.   Nutritional Diagnosis:  NB-1.1 Food and nutrition-related knowledge deficit As related to balance of carbohydrate, and fat.  As evidenced by patient report and diet hx.    Intervention:  Nutrition counseling/education regarding management of diabetes and weight loss.  Discussed mindful eating, CHO counting by portion control, and travel tips as well as tips when eating out.  Continue to keep your food journal. Attempt a normal routine as much as possible when you are not traveling. Resume some form of exercise most days of the week. Walking is great!  Go to the mailbox daily.  "Superman pose" to help avoid excuses. When you travel try:  [employer] bread with almond butter and fresh fruit for breakfast OR Plain oatmeal and fruit Be mindful about what you are choosing when you are eating out.  Google [employer] and nutritional information to get information on the restaurant's food OR use https://www.lee.info/ Eat slowly and stop when you are full. Be careful with added fats. Small amount of protein with each meal and snack.  Aim for 2-3 Carb Choices per meal (30-45 grams) +/- 1 either way  Aim for 0-1 Carbs per snack if hungry  Include protein in moderation with your meals and snacks Consider reading food labels for Total Carbohydrate and Fat Grams of foods  Teaching Method Utilized:  Visual Auditory Hands on  Handouts given during visit include: Living Well with Diabetes Food Label handouts Meal Plan Card Snack list  Barriers to learning/adherence to lifestyle change: travel  Demonstrated degree of understanding via:  Teach Back   Monitoring/Evaluation:  Dietary intake, exercise, label reading, and body weight prn.

## 2015-04-23 DIAGNOSIS — L03012 Cellulitis of left finger: Secondary | ICD-10-CM | POA: Diagnosis not present

## 2015-04-23 MED ORDER — POTASSIUM CHLORIDE CRYS ER 10 MEQ PO TBCR: 20.0000 meq | EXTENDED_RELEASE_TABLET | Freq: Every day | ORAL | Status: DC

## 2015-04-23 MED ORDER — CHLORTHALIDONE 25 MG PO TABS
25.0000 mg | ORAL_TABLET | Freq: Every day | ORAL | Status: DC
Start: 2015-04-23 — End: 2015-10-15

## 2015-05-10 DIAGNOSIS — Z6841 Body Mass Index (BMI) 40.0 and over, adult: Secondary | ICD-10-CM | POA: Diagnosis not present

## 2015-05-10 DIAGNOSIS — I10 Essential (primary) hypertension: Secondary | ICD-10-CM | POA: Diagnosis not present

## 2015-05-10 DIAGNOSIS — E119 Type 2 diabetes mellitus without complications: Secondary | ICD-10-CM | POA: Diagnosis not present

## 2015-05-24 ENCOUNTER — Telehealth: Payer: Self-pay | Admitting: Internal Medicine

## 2015-05-24 DIAGNOSIS — M7989 Other specified soft tissue disorders: Secondary | ICD-10-CM

## 2015-05-24 NOTE — Telephone Encounter (Signed)
call her up and ask how the finger is doing( has been treated by urgent care )see  My note 3 17  Get her most recent visit from  Urgent  Care of not in record   if getting worse   Ov   In the interim   Please refer   To hand  Surgeon specialist

## 2015-05-24 NOTE — Telephone Encounter (Signed)
Pt would like to go to a Hand Specialist for her left hand middle finger.

## 2015-05-25 NOTE — Telephone Encounter (Signed)
Pt states it was doing better until yesterday, then it was more painful all and it still swollen. Fingers are not at their regular size.  Pt will be expecting a call for hand specialist referral.

## 2015-05-25 NOTE — Telephone Encounter (Signed)
Left a message for a return call.

## 2015-05-28 NOTE — Telephone Encounter (Signed)
Left a message for the pt to call back with the name of the urgent care so medical records can get the ov note.

## 2015-05-29 ENCOUNTER — Telehealth: Payer: Self-pay | Admitting: Internal Medicine

## 2015-05-29 NOTE — Telephone Encounter (Signed)
Pt call to say her treatment was at [facility] on [address]   [phone]    Dr Mallie Mussel is the doctor

## 2015-05-29 NOTE — Telephone Encounter (Signed)
She said you knew and told her to call back with the below information

## 2015-05-29 NOTE — Telephone Encounter (Signed)
We have no information since her last visit with [place] in  March 21  Is it worse  Go ahead and refer to hand specialist based on last visti  Still advise getting the records

## 2015-05-29 NOTE — Telephone Encounter (Signed)
What treatment???

## 2015-05-30 ENCOUNTER — Other Ambulatory Visit: Payer: Self-pay | Admitting: Family Medicine

## 2015-05-30 NOTE — Telephone Encounter (Signed)
Left a message for a return call.

## 2015-06-01 ENCOUNTER — Other Ambulatory Visit (INDEPENDENT_AMBULATORY_CARE_PROVIDER_SITE_OTHER): Payer: Medicare Other

## 2015-06-01 ENCOUNTER — Other Ambulatory Visit: Payer: Self-pay | Admitting: Family Medicine

## 2015-06-01 DIAGNOSIS — M65331 Trigger finger, right middle finger: Secondary | ICD-10-CM | POA: Diagnosis not present

## 2015-06-01 DIAGNOSIS — M7989 Other specified soft tissue disorders: Secondary | ICD-10-CM

## 2015-06-01 DIAGNOSIS — M19042 Primary osteoarthritis, left hand: Secondary | ICD-10-CM | POA: Diagnosis not present

## 2015-06-01 DIAGNOSIS — R52 Pain, unspecified: Secondary | ICD-10-CM

## 2015-06-01 LAB — CBC
HEMATOCRIT: 35.4 % — AB (ref 36.0–46.0)
HEMOGLOBIN: 11.9 g/dL — AB (ref 12.0–15.0)
MCHC: 33.5 g/dL (ref 30.0–36.0)
MCV: 87.3 fl (ref 78.0–100.0)
Platelets: 156 10*3/uL (ref 150.0–400.0)
RBC: 4.06 Mil/uL (ref 3.87–5.11)
RDW: 14.1 % (ref 11.5–15.5)
WBC: 4.8 10*3/uL (ref 4.0–10.5)

## 2015-06-01 LAB — C-REACTIVE PROTEIN: CRP: 1.8 mg/dL (ref 0.5–20.0)

## 2015-06-01 LAB — SEDIMENTATION RATE: SED RATE: 43 mm/h — AB (ref 0–22)

## 2015-06-01 LAB — URIC ACID: Uric Acid, Serum: 10.7 mg/dL — ABNORMAL HIGH (ref 2.4–7.0)

## 2015-06-02 LAB — RHEUMATOID FACTOR

## 2015-06-04 LAB — CYCLIC CITRUL PEPTIDE ANTIBODY, IGG

## 2015-06-05 LAB — ANA: Anti Nuclear Antibody(ANA): POSITIVE — AB

## 2015-06-05 LAB — ANTI-NUCLEAR AB-TITER (ANA TITER): ANA Titer 1: 1:80 {titer} — ABNORMAL HIGH

## 2015-06-05 NOTE — Telephone Encounter (Signed)
Pt seen at [facility] on [address].  Please call and see if they will release last visit.  Their telephone number is [phone].  If not, call the pt and have her sign release form.

## 2015-06-18 DIAGNOSIS — M7989 Other specified soft tissue disorders: Secondary | ICD-10-CM | POA: Diagnosis not present

## 2015-07-04 ENCOUNTER — Other Ambulatory Visit: Payer: Self-pay | Admitting: *Deleted

## 2015-07-04 MED ORDER — LEVOTHYROXINE SODIUM 75 MCG PO TABS: 75.0000 ug | ORAL_TABLET | Freq: Every day | ORAL | Status: DC

## 2015-07-05 ENCOUNTER — Ambulatory Visit: Payer: Medicare Other

## 2015-07-05 ENCOUNTER — Ambulatory Visit (INDEPENDENT_AMBULATORY_CARE_PROVIDER_SITE_OTHER): Payer: Medicare Other | Admitting: Podiatry

## 2015-07-05 ENCOUNTER — Encounter: Payer: Self-pay | Admitting: Podiatry

## 2015-07-05 VITALS — BP 128/60 | HR 74 | Resp 16

## 2015-07-05 DIAGNOSIS — M1000 Idiopathic gout, unspecified site: Secondary | ICD-10-CM

## 2015-07-05 DIAGNOSIS — E119 Type 2 diabetes mellitus without complications: Secondary | ICD-10-CM | POA: Diagnosis not present

## 2015-07-05 DIAGNOSIS — M779 Enthesopathy, unspecified: Secondary | ICD-10-CM | POA: Diagnosis not present

## 2015-07-05 DIAGNOSIS — M79674 Pain in right toe(s): Secondary | ICD-10-CM

## 2015-07-05 MED ORDER — TRIAMCINOLONE ACETONIDE 10 MG/ML IJ SUSP
10.0000 mg | Freq: Once | INTRAMUSCULAR | Status: AC
  Administered 2015-07-05: 10 mg

## 2015-07-05 MED ORDER — METHYLPREDNISOLONE 4 MG PO TBPK: ORAL_TABLET | ORAL | Status: DC

## 2015-07-05 NOTE — Patient Instructions (Signed)

## 2015-07-05 NOTE — Progress Notes (Signed)
   Subjective:    Patient ID: Rebecca Rich, female    DOB: 11-Jun-1945, 70 y.o.   MRN: [id number]  HPI    Review of Systems  Musculoskeletal: Positive for joint swelling and gait problem.  All other systems reviewed and are negative.      Objective:   Physical Exam        Assessment & Plan:

## 2015-07-05 NOTE — Progress Notes (Signed)
Subjective:     Patient ID: Rebecca Rich, female   DOB: 1945-09-10, 70 y.o.   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 I discussed gout educating her on this and I gave her information concerning foods to avoid and if we get any more symptoms we will send her for blood work with considerations for long-term treatment  Decayed inflammation around the first MPJ right with mild arthritis but no indication of ostial lysis

## 2015-07-10 DIAGNOSIS — E119 Type 2 diabetes mellitus without complications: Secondary | ICD-10-CM | POA: Diagnosis not present

## 2015-07-10 DIAGNOSIS — H2513 Age-related nuclear cataract, bilateral: Secondary | ICD-10-CM | POA: Diagnosis not present

## 2015-08-10 ENCOUNTER — Other Ambulatory Visit (INDEPENDENT_AMBULATORY_CARE_PROVIDER_SITE_OTHER): Payer: Medicare Other

## 2015-08-10 DIAGNOSIS — E119 Type 2 diabetes mellitus without complications: Secondary | ICD-10-CM | POA: Diagnosis not present

## 2015-08-10 LAB — HEMOGLOBIN A1C: Hgb A1c MFr Bld: 7.4 % — ABNORMAL HIGH (ref 4.6–6.5)

## 2015-08-15 ENCOUNTER — Ambulatory Visit (INDEPENDENT_AMBULATORY_CARE_PROVIDER_SITE_OTHER): Payer: Medicare Other | Admitting: Podiatry

## 2015-08-15 ENCOUNTER — Encounter: Payer: Self-pay | Admitting: Podiatry

## 2015-08-15 VITALS — BP 121/50

## 2015-08-15 DIAGNOSIS — E119 Type 2 diabetes mellitus without complications: Secondary | ICD-10-CM

## 2015-08-15 DIAGNOSIS — M79675 Pain in left toe(s): Secondary | ICD-10-CM | POA: Diagnosis not present

## 2015-08-15 DIAGNOSIS — B351 Tinea unguium: Secondary | ICD-10-CM

## 2015-08-15 DIAGNOSIS — M79674 Pain in right toe(s): Secondary | ICD-10-CM | POA: Diagnosis not present

## 2015-08-15 NOTE — Progress Notes (Signed)
   Subjective:    Patient ID: Rebecca Rich, female    DOB: 06-12-1945, 70 y.o.   MRN: [id number]  HPI This patient presents today requesting a diabetic foot examination and debridement of toenails which sure she says her uncomfortable walking wearing shoes. Patient is a type II diabetic and denies any history of foot ulceration, claudication or amputation. The last visit for diabetic foot examination was on 08/16/2014.  Patient also was seen in office on 07/05/2015 for probable gout episode in the right great toe joint area which was treated with local Kenalog injections which resolved symptoms   Review of Systems  Cardiovascular: Positive for leg swelling.       Objective:   Physical Exam  Orientated 3  Vascular: No calf edema or calf tenderness bilaterally Mild pitting edema ankles bilaterally DP and PT pulses 2/4 bilaterally Capillary reflex immediate bilaterally  Neurological: Sensation to 10 g monofilament wire intact 5/5 bilaterally Vibratory sensation reactive bilaterally Ankle reflex equal and reactive bilaterally  Dermatological: No open skin lesions bilaterally The toenails elongated brittle, deformed 6-10  Musculoskeletal: HAV right No restriction or crepitus on range of motion of ankle, subtalar, midtarsal joints bilaterally      Assessment & Plan:   Assessment: Satisfactory neurovascular status Diabetic without foot complications Symptomatic onychomycoses 6-10  Plan: Reviewed the results of examination with patient today and offered nail debridement and she verbally consents Toenails 6-10 are debrided mechanically and electrically without any bleeding I recommended returned intervals at 3 months, however, patient wished to call our office for follow-up visits  Reappoint yearly or at patient's request

## 2015-08-15 NOTE — Patient Instructions (Signed)
Today your diabetic foot screen demonstrated adequate circulation and feeling in your right and left feet. Return as needed for specific problems or yearly  Diabetes and Foot Care Diabetes may cause you to have problems because of poor blood supply (circulation) to your feet and legs. This may cause the skin on your feet to become thinner, break easier, and heal more slowly. Your skin may become dry, and the skin may peel and crack. You may also have nerve damage in your legs and feet causing decreased feeling in them. You may not notice minor injuries to your feet that could lead to infections or more serious problems. Taking care of your feet is one of the most important things you can do for yourself.  HOME CARE INSTRUCTIONS  Wear shoes at all times, even in the house. Do not go barefoot. Bare feet are easily injured.  Check your feet daily for blisters, cuts, and redness. If you cannot see the bottom of your feet, use a mirror or ask someone for help.  Wash your feet with warm water (do not use hot water) and mild soap. Then pat your feet and the areas between your toes until they are completely dry. Do not soak your feet as this can dry your skin.  Apply a moisturizing lotion or petroleum jelly (that does not contain alcohol and is unscented) to the skin on your feet and to dry, brittle toenails. Do not apply lotion between your toes.  Trim your toenails straight across. Do not dig under them or around the cuticle. File the edges of your nails with an emery board or nail file.  Do not cut corns or calluses or try to remove them with medicine.  Wear clean socks or stockings every day. Make sure they are not too tight. Do not wear knee-high stockings since they may decrease blood flow to your legs.  Wear shoes that fit properly and have enough cushioning. To break in new shoes, wear them for just a few hours a day. This prevents you from injuring your feet. Always look in your shoes before you  put them on to be sure there are no objects inside.  Do not cross your legs. This may decrease the blood flow to your feet.  If you find a minor scrape, cut, or break in the skin on your feet, keep it and the skin around it clean and dry. These areas may be cleansed with mild soap and water. Do not cleanse the area with peroxide, alcohol, or iodine.  When you remove an adhesive bandage, be sure not to damage the skin around it.  If you have a wound, look at it several times a day to make sure it is healing.  Do not use heating pads or hot water bottles. They may burn your skin. If you have lost feeling in your feet or legs, you may not know it is happening until it is too late.  Make sure your health care provider performs a complete foot exam at least annually or more often if you have foot problems. Report any cuts, sores, or bruises to your health care provider immediately. SEEK MEDICAL CARE IF:   You have an injury that is not healing.  You have cuts or breaks in the skin.  You have an ingrown nail.  You notice redness on your legs or feet.  You feel burning or tingling in your legs or feet.  You have pain or cramps in your legs and  feet.  Your legs or feet are numb.  Your feet always feel cold. SEEK IMMEDIATE MEDICAL CARE IF:   There is increasing redness, swelling, or pain in or around a wound.  There is a red line that goes up your leg.  Pus is coming from a wound.  You develop a fever or as directed by your health care provider.  You notice a bad smell coming from an ulcer or wound.   This information is not intended to replace advice given to you by your health care provider. Make sure you discuss any questions you have with your health care provider.   Document Released: 01/11/2000 Document Revised: 09/15/2012 Document Reviewed: 06/22/2012 Elsevier Interactive Patient Education [employer].

## 2015-08-16 ENCOUNTER — Encounter: Payer: Self-pay | Admitting: Podiatry

## 2015-08-17 ENCOUNTER — Ambulatory Visit: Payer: Medicare Other | Admitting: Internal Medicine

## 2015-08-21 ENCOUNTER — Encounter: Payer: Self-pay | Admitting: Internal Medicine

## 2015-08-21 ENCOUNTER — Ambulatory Visit (INDEPENDENT_AMBULATORY_CARE_PROVIDER_SITE_OTHER): Payer: Medicare Other | Admitting: Internal Medicine

## 2015-08-21 VITALS — BP 132/78 | Temp 98.8°F | Ht 63.0 in | Wt 290.2 lb

## 2015-08-21 DIAGNOSIS — I10 Essential (primary) hypertension: Secondary | ICD-10-CM | POA: Diagnosis not present

## 2015-08-21 DIAGNOSIS — E119 Type 2 diabetes mellitus without complications: Secondary | ICD-10-CM | POA: Diagnosis not present

## 2015-08-21 DIAGNOSIS — I359 Nonrheumatic aortic valve disorder, unspecified: Secondary | ICD-10-CM | POA: Diagnosis not present

## 2015-08-21 DIAGNOSIS — I358 Other nonrheumatic aortic valve disorders: Secondary | ICD-10-CM

## 2015-08-21 DIAGNOSIS — Z8739 Personal history of other diseases of the musculoskeletal system and connective tissue: Secondary | ICD-10-CM

## 2015-08-21 DIAGNOSIS — Z8639 Personal history of other endocrine, nutritional and metabolic disease: Secondary | ICD-10-CM

## 2015-08-21 DIAGNOSIS — I350 Nonrheumatic aortic (valve) stenosis: Secondary | ICD-10-CM | POA: Insufficient documentation

## 2015-08-21 NOTE — Progress Notes (Signed)
Pre visit review using our clinic review tool, if applicable. No additional management support is needed unless otherwise documented below in the visit note.  Chief Complaint  Patient presents with  . Follow-up    HPI: Rebecca Rich 70 y.o.  Comes in for fu diabetes    Had poss episode of gout  And saw podiatry and given  Steroids   In [place] "not payin gattention ot her health otherwise" having dental procedures for broken tooth.   Sis  Breast cancer and rx help[ing and dental problems    Not much exercise .  ROS: See pertinent positives and negatives per HPI. Left finger middle     Fused   Left dx with arthritis   And infectino still has decreased ROM but no pain . No hx of psoriasis   Past Medical History:  Diagnosis Date  . Acute bronchospasm 08/24/2009  . Acute sphenoidal sinusitis 02/26/2010   Qualifier: Diagnosis of  By: Regis Bill MD, Standley Brooking   . CARDIAC MURMUR 12/08/2006  . COLONIC POLYPS, HX OF 12/08/2006  . DIABETES MELLITUS, TYPE II 12/08/2006  . Gallbladder/common duct stone, without infection, with obstruction 03/01/2010   removed ercp  . HYPERLIPIDEMIA 12/08/2006  . HYPERTENSION 12/08/2006  . 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 INFECTION, SKIN AND SOFT TISSUE 07/28/2008  . 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 LIVER FUNCTION TESTS, ABNORMAL 07/28/2008  . Morbid obesity ([place]) 12/08/2006  . OBESITY 09/24/2009  . OBSTRUCTIVE SLEEP APNEA 12/08/2006  . OSTEOARTHRITIS 12/08/2006  . RUQ PAIN 06/16/2008  . SHOULDER PAIN, RIGHT 02/07/2008  . Sleep apnea   . Swelling of limb 07/28/2008  . THYROID FUNCTION TEST, ABNORMAL 12/08/2006    Family History  Problem Relation Age of Onset  . Other Mother     blood clots  . Cervical cancer Mother   . Cancer Mother   . Heart disease Brother   . Diabetes Brother   . Heart disease Sister   . Diabetes Sister   . Heart disease Brother   . Diabetes Brother   . Heart disease Brother   . Diabetes Brother   . Stroke Sister   . Diabetes Sister   . Throat  cancer Father   . Cancer Father   . Diabetes Other     all siblings, 4 brothers, 5 sisters    Social History   Social History  . Marital status: Single    Spouse name: N/A  . Number of children: N/A  . Years of education: N/A   Occupational History  . retired Retired   Social History Main Topics  . Smoking status: Never Smoker  . Smokeless tobacco: Never Used  . Alcohol use No  . Drug use: No  . Sexual activity: Not Asked   Other Topics Concern  . None   Social History Narrative   Master level education in math   Pt is currently retired   Pt is divorced with children   Recently had to move had a break in and thus away from her pool exercise    Outpatient Medications Prior to Visit  Medication Sig Dispense Refill  . chlorthalidone (HYGROTON) 25 MG tablet Take 1 tablet (25 mg total) by mouth daily. 90 tablet 1  . cholecalciferol (VITAMIN D) 1000 UNITS tablet Take 1,000 Units by mouth daily.    Marland Kitchen glucose blood (ACCU-CHEK AVIVA PLUS) test strip Use as instructed 100 each 12  . levothyroxine (SYNTHROID) 75 MCG tablet Take 1 tablet (75 mcg total) by mouth daily  before breakfast. 90 tablet 3  . Misc Natural Products (OSTEO BI-FLEX ADV JOINT SHIELD PO) Take 1 tablet by mouth daily.    . Multiple Vitamins-Minerals (CENTRUM SILVER PO) Take 1 tablet by mouth daily.      . potassium chloride (K-DUR,KLOR-CON) 10 MEQ tablet Take 2 tablets (20 mEq total) by mouth daily. 180 tablet 1  . ramipril (ALTACE) 10 MG capsule Take 1 capsule (10 mg total) by mouth 2 (two) times daily. 180 capsule 1  . doxycycline (VIBRA-TABS) 100 MG tablet Take 100 mg by mouth 2 (two) times daily.  0  . methylPREDNISolone (MEDROL DOSEPAK) 4 MG TBPK tablet follow package directions 21 tablet 0   No facility-administered medications prior to visit.      EXAM:  BP 132/78   Temp 98.8 F (37.1 C) (Oral)   Ht 5\' 3"  (1.6 m)   Wt 290 lb 3.2 oz (131.6 kg)   BMI 51.41 kg/m   Body mass index is 51.41  kg/m.  GENERAL: vitals reviewed and listed above, alert, oriented, appears well hydrated and in no acute distress HEENT: atraumatic, conjunctiva  clear, no obvious abnormalities on inspection of external nose and ears  NECK: no obvious masses on inspection palpation  CV: HRRR, no clubbing cyanosis or  peripheral edema nl cap refill   2/6 swem usp no radiation MS: moves all extremities without noticeable focal  Abnormality left  Middle finger  Dec rom  arhtritis   No redness   No pain.  Right mtp slight  Redness no ulcer   PSYCH: pleasant and cooperative, no obvious depression or anxiety  Wt Readings from Last 3 Encounters:  08/21/15 290 lb 3.2 oz (131.6 kg)  04/20/15 294 lb (133.4 kg)  04/17/15 293 lb 11.2 oz (133.2 kg)   BP Readings from Last 3 Encounters:  08/21/15 132/78  08/16/15 (!) 121/50  07/05/15 128/60   Lab Results  Component Value Date   WBC 4.8 06/01/2015   HGB 11.9 (L) 06/01/2015   HCT 35.4 (L) 06/01/2015   PLT 156.0 06/01/2015   GLUCOSE 139 (H) 04/03/2015   CHOL 144 01/02/2015   TRIG 131.0 01/02/2015   HDL 45.30 01/02/2015   LDLCALC 72 01/02/2015   ALT 11 10/31/2014   AST 12 10/31/2014   NA 140 04/03/2015   K 3.9 04/03/2015   CL 105 04/03/2015   CREATININE 1.06 04/03/2015   BUN 30 (H) 04/03/2015   CO2 23 04/03/2015   TSH 2.71 04/03/2015   HGBA1C 7.4 (H) 08/10/2015   MICROALBUR 0.4 08/02/2009    ASSESSMENT AND PLAN:  Discussed the following assessment and plan:  Type 2 diabetes mellitus without complication, without long-term current use of insulin (HCC)  Morbid obesity, unspecified obesity type ([place])  Essential hypertension - better on repeat   Hx of gout  Aortic heart murmur -Patient advised to return or notify health care team  if symptoms worsen ,persist or new concerns arise. Total visit 15mins > 50% spent counseling and coordinating care as indicated in above note and in instructions to patient .    Patient Instructions   If  Diabetes  not getting better   At next 3 months check then we should or adding metformin again .Marland Kitchen   Will plan lab include  Uric acid level and blood count and hg a1c and BMP.   bp on repeat was better  130/76  .      Standley Brooking. Panosh M.D.

## 2015-08-21 NOTE — Assessment & Plan Note (Signed)
Deteriorating control. Patient very chagrined that doesn't want to be on medication for bargaining for another 3 months and if not coming down will agree on metformin. I will hold her to her promise she will come back in 3 months with an A1c and other labs at that visit.

## 2015-08-21 NOTE — Patient Instructions (Addendum)
  If  Diabetes not getting better   At next 3 months check then we should or adding metformin again .Marland Kitchen   Will plan lab include  Uric acid level and blood count and hg a1c and BMP.   bp on repeat was better  130/76  .

## 2015-09-04 ENCOUNTER — Ambulatory Visit (INDEPENDENT_AMBULATORY_CARE_PROVIDER_SITE_OTHER): Payer: Medicare Other | Admitting: Podiatry

## 2015-09-04 ENCOUNTER — Encounter: Payer: Self-pay | Admitting: Podiatry

## 2015-09-04 ENCOUNTER — Ambulatory Visit (INDEPENDENT_AMBULATORY_CARE_PROVIDER_SITE_OTHER): Payer: Medicare Other

## 2015-09-04 VITALS — BP 149/65 | HR 88 | Resp 12

## 2015-09-04 DIAGNOSIS — M79672 Pain in left foot: Secondary | ICD-10-CM

## 2015-09-04 DIAGNOSIS — S96912A Strain of unspecified muscle and tendon at ankle and foot level, left foot, initial encounter: Secondary | ICD-10-CM

## 2015-09-04 NOTE — Patient Instructions (Signed)
Turf Toe Turf toe is a condition of pain at the base of the big toe, located at the ball of the foot. The condition is usually caused from either jamming or extending the toe beyond normal limits (hyperextension). This is the result of pushing off repeatedly when running or jumping. The main problem is pain at the base of the toe, but there may also be stiffness and swelling. The name turf toe comes from the fact that this injury is especially common among athletes who play on hard surfaces, such as artificial turf and basketball courts. Hard surfaces combined with running and jumping makes this a common sports injury. DIAGNOSIS  The diagnosis of turftoeisnotdifficult. It is made by examination. X-rays may be taken to make sure there is nobreak in the bone (fracture). Not doing surgery (conservative treatment) solves the problem most of the time. Conservative treatment includes the following home care instructions. HOME CARE INSTRUCTIONS   Apply ice to the sore area for 15-20 minutes, 03-04 times per day while awake, for the first 4 days. Put the ice in a plastic bag and place a towel between the bag of ice and your skin. Use ice if possible following any activities, even after the first four days.  Keep your leg elevated when possible to lessen swelling and discomfort in the toe.  Use crutches with non-weight bearing on the affected foot for ten days, or as needed for pain. Then you may walk as the pain allows, or as instructed. Start gradually with weight bearing on the affected foot. Shoes with stiff soles will generally be helpful in limiting pain for the first 1 to 2 weeks.  Continue to use crutches or a cane until you can stand on your foot without causing pain.  Only take over-the-counter or prescription medicines for pain, discomfort, or fever as directed by your caregiver. SEEK IMMEDIATE MEDICAL CARE IF:   You have an increase in bruising, swelling, or pain in your toe.  Pain relief is  not obtained with medications. Turf toe can return, and problems may be slow to improve. This is more common if you return to athletic activities too soon and do not allow the problem to fully recover. Surgery is rarely needed, but in certain cases it may be necessary. If a bone spur forms and severely limits motion of the toe joint, surgery to remove the spur and improve motion of the big toe may be helpful.   This information is not intended to replace advice given to you by your health care provider. Make sure you discuss any questions you have with your health care provider.   Document Released: 07/05/2001 Document Revised: 04/07/2011 Document Reviewed: 07/26/2014 Elsevier Interactive Patient Education 2016 [employer] Inc[employer]  

## 2015-09-04 NOTE — Progress Notes (Signed)
   Subjective:    Patient ID: Rebecca Rich, female    DOB: 1945/04/23, 70 y.o.   MRN: [id number]  HPI  N-SORE, SWOLLEN   This patient presents today complaining of a painful left foot with maximum tenderness and on the left great toe joint area. She describes right hand the foot when walking in the house hitting the foot against a piece of furniture. Subsequently she described pain, swelling in or around her left great toe joint area which is causing her to limp when she walks. Patient is wearing a soft shoe to accommodate to the swelling.  Patient is type II diabetic and denies any history of foot ulceration, claudication or amputation      Review of Systems  Musculoskeletal: Positive for gait problem.  Skin: Positive for color change.       Objective:   Physical Exam  Orientated 3  Vascular: No calf edema or calf tenderness bilaterally DP and PT pulses 2/4 bilaterally Capillary reflex immediate bilaterally  Neurological: Sensation to 10 g monofilament wire intact 5/5 bilaterally Vibratory sensation intact bilaterally Ankle reflex equal and reactive bilaterally  Dermatological: No open skin lesions bilaterally Low-grade erythema and edema localized around the dorsal medial left first MPJ  Musculoskeletal: HAV left Guarding and tenderness to palpation left first MPJ and tenderness on range of motion left first MPJ Dorsi flexion, plantar flexion 5/5 bilaterally  X-ray examination weightbearing left foot dated 09/04/2015  Intact bony structures without fracture and/or dislocation Midfoot osteoarthritic changes Posterior and inferior calcaneal spurs Generalized increased soft tissue density  Radiographic impression: No acute bony abnormality noted in the weightbearing x-ray of the left foot dated 09/04/2015       Assessment & Plan:   Assessment: Sprained/turf toe left hallux  diabetic with satisfactory neurovascular status   Plan: Reviewed the  results of examination with patient today Dispensed surgical shoe to wear in the left foot daily until pain and discomfort ended  Reappoint at patient's request

## 2015-09-24 ENCOUNTER — Other Ambulatory Visit: Payer: Self-pay | Admitting: Internal Medicine

## 2015-09-26 NOTE — Telephone Encounter (Signed)
Sent to the pharmacy by e-scribe. 

## 2015-10-15 ENCOUNTER — Other Ambulatory Visit: Payer: Self-pay | Admitting: Internal Medicine

## 2015-10-17 NOTE — Telephone Encounter (Signed)
Sent to the pharmacy by e-scribe.  Pt has upcoming follow up on 11/23/15

## 2015-11-14 ENCOUNTER — Other Ambulatory Visit: Payer: Self-pay | Admitting: Family Medicine

## 2015-11-14 DIAGNOSIS — E790 Hyperuricemia without signs of inflammatory arthritis and tophaceous disease: Secondary | ICD-10-CM

## 2015-11-14 DIAGNOSIS — E119 Type 2 diabetes mellitus without complications: Secondary | ICD-10-CM

## 2015-11-14 DIAGNOSIS — I10 Essential (primary) hypertension: Secondary | ICD-10-CM

## 2015-11-15 ENCOUNTER — Other Ambulatory Visit (INDEPENDENT_AMBULATORY_CARE_PROVIDER_SITE_OTHER): Payer: Medicare Other

## 2015-11-15 DIAGNOSIS — E790 Hyperuricemia without signs of inflammatory arthritis and tophaceous disease: Secondary | ICD-10-CM

## 2015-11-15 DIAGNOSIS — E119 Type 2 diabetes mellitus without complications: Secondary | ICD-10-CM

## 2015-11-15 DIAGNOSIS — I10 Essential (primary) hypertension: Secondary | ICD-10-CM | POA: Diagnosis not present

## 2015-11-15 LAB — BASIC METABOLIC PANEL
BUN: 24 mg/dL — ABNORMAL HIGH (ref 6–23)
CALCIUM: 9.1 mg/dL (ref 8.4–10.5)
CO2: 26 mEq/L (ref 19–32)
CREATININE: 1.2 mg/dL (ref 0.40–1.20)
Chloride: 104 mEq/L (ref 96–112)
GFR: 56.7 mL/min — AB (ref >60.00)
Glucose, Bld: 135 mg/dL — ABNORMAL HIGH (ref 70–99)
Potassium: 3.8 mEq/L (ref 3.5–5.1)
SODIUM: 138 meq/L (ref 135–145)

## 2015-11-15 LAB — CBC WITH DIFFERENTIAL/PLATELET
BASOS ABS: 0 10*3/uL (ref 0.0–0.1)
Basophils Relative: 0.3 % (ref 0.0–3.0)
EOS ABS: 0.2 10*3/uL (ref 0.0–0.7)
Eosinophils Relative: 4.5 % (ref 0.0–5.0)
HCT: 35.6 % — ABNORMAL LOW (ref 36.0–46.0)
HEMOGLOBIN: 11.8 g/dL — AB (ref 12.0–15.0)
LYMPHS PCT: 35.6 % (ref 12.0–46.0)
Lymphs Abs: 1.9 10*3/uL (ref 0.7–4.0)
MCHC: 33.2 g/dL (ref 30.0–36.0)
MCV: 88.4 fl (ref 78.0–100.0)
MONO ABS: 0.4 10*3/uL (ref 0.1–1.0)
Monocytes Relative: 7.5 % (ref 3.0–12.0)
Neutro Abs: 2.8 10*3/uL (ref 1.4–7.7)
Neutrophils Relative %: 52.1 % (ref 43.0–77.0)
Platelets: 176 10*3/uL (ref 150.0–400.0)
RBC: 4.02 Mil/uL (ref 3.87–5.11)
RDW: 14.4 % (ref 11.5–15.5)
WBC: 5.4 10*3/uL (ref 4.0–10.5)

## 2015-11-15 LAB — URIC ACID: URIC ACID, SERUM: 9.9 mg/dL — AB (ref 2.4–7.0)

## 2015-11-15 LAB — HEMOGLOBIN A1C: HEMOGLOBIN A1C: 7.2 % — AB (ref 4.6–6.5)

## 2015-11-22 NOTE — Progress Notes (Signed)
Pre visit review using our clinic review tool, if applicable. No additional management support is needed unless otherwise documented below in the visit note.  Chief Complaint  Patient presents with  . Follow-up    HPI: Rebecca Rich 70 y.o.  Follow-up chronic disease management. Diabetes still trying to use lifestyle intervention not exercising as much and he plans to not travel as much recently either when she found out her uric acid was elevated she had to change her diet that thought it made it worse for her diabetes. No new symptoms related. Blood pressure seems to be 140/70 at home. No side effects of her blood pressure medicine. No recent gout attacks and no history of uric acid stones. That she is aware of.  ROS: See pertinent positives and negatives per HPI. No chest pain shortness of breath falling dysesthesias or major changes today.  Past Medical History:  Diagnosis Date  . Acute bronchospasm 08/24/2009  . Acute sphenoidal sinusitis 02/26/2010   Qualifier: Diagnosis of  By: Regis Bill MD, Standley Brooking   . CARDIAC MURMUR 12/08/2006  . COLONIC POLYPS, HX OF 12/08/2006  . DIABETES MELLITUS, TYPE II 12/08/2006  . Gallbladder/common duct stone, without infection, with obstruction 03/01/2010   removed ercp  . HYPERLIPIDEMIA 12/08/2006  . HYPERTENSION 12/08/2006  . 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 INFECTION, SKIN AND SOFT TISSUE 07/28/2008  . 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 LIVER FUNCTION TESTS, ABNORMAL 07/28/2008  . Morbid obesity ([place]) 12/08/2006  . OBESITY 09/24/2009  . OBSTRUCTIVE SLEEP APNEA 12/08/2006  . OSTEOARTHRITIS 12/08/2006  . RUQ PAIN 06/16/2008  . SHOULDER PAIN, RIGHT 02/07/2008  . Sleep apnea   . Swelling of limb 07/28/2008  . THYROID FUNCTION TEST, ABNORMAL 12/08/2006    Family History  Problem Relation Age of Onset  . Other Mother     blood clots  . Cervical cancer Mother   . Cancer Mother   . Heart disease Brother   . Diabetes Brother   . Heart disease Sister   .  Diabetes Sister   . Heart disease Brother   . Diabetes Brother   . Heart disease Brother   . Diabetes Brother   . Stroke Sister   . Diabetes Sister   . Throat cancer Father   . Cancer Father   . Diabetes Other     all siblings, 4 brothers, 5 sisters    Social History   Social History  . Marital status: Single    Spouse name: N/A  . Number of children: N/A  . Years of education: N/A   Occupational History  . retired Retired   Social History Main Topics  . Smoking status: Never Smoker  . Smokeless tobacco: Never Used  . Alcohol use No  . Drug use: No  . Sexual activity: Not Asked   Other Topics Concern  . None   Social History Narrative   Master level education in math   Pt is currently retired   Pt is divorced with children   Recently had to move had a break in and thus away from her pool exercise    Outpatient Medications Prior to Visit  Medication Sig Dispense Refill  . chlorthalidone (HYGROTON) 25 MG tablet take 1 tablet by mouth once daily 90 tablet 1  . cholecalciferol (VITAMIN D) 1000 UNITS tablet Take 1,000 Units by mouth daily.    Marland Kitchen glucose blood (ACCU-CHEK AVIVA PLUS) test strip Use as instructed 100 each 12  . levothyroxine (SYNTHROID) 75 MCG tablet Take 1  tablet (75 mcg total) by mouth daily before breakfast. 90 tablet 3  . Misc Natural Products (OSTEO BI-FLEX ADV JOINT SHIELD PO) Take 1 tablet by mouth daily.    . Multiple Vitamins-Minerals (CENTRUM SILVER PO) Take 1 tablet by mouth daily.      . potassium chloride (K-DUR,KLOR-CON) 10 MEQ tablet take 2 tablets by mouth once daily 180 tablet 1  . ramipril (ALTACE) 10 MG capsule take 1 capsule by mouth twice a day 180 capsule 1   No facility-administered medications prior to visit.      EXAM:  BP 140/60 (BP Location: Right Arm, Cuff Size: Large)   Temp 98.7 F (37.1 C) (Oral)   Ht 5\' 3"  (1.6 m)   Wt 287 lb 9.6 oz (130.5 kg)   BMI 50.95 kg/m   Body mass index is 50.95 kg/m.  GENERAL: vitals  reviewed and listed above, alert, oriented, appears well hydrated and in no acute distress HEENT: atraumatic, conjunctiva  clear, no obvious abnormalities on inspection of external nose and ears NECK: no obvious masses on inspection palpation  LUNGS: clear to auscultation bilaterally, no wheezes, rales or rhonchi,  CV: HRRR, no clubbing cyanosis or  peripheral edema nl cap refill Short systolic ejection murmur upper sternal border. Skin no bruising or bleeding. No petechiae. MS: moves all extremities without noticeable focal  abnormality PSYCH: pleasant and cooperative, no obvious depression or anxiety Lab Results  Component Value Date   WBC 5.4 11/15/2015   HGB 11.8 (L) 11/15/2015   HCT 35.6 (L) 11/15/2015   PLT 176.0 11/15/2015   GLUCOSE 135 (H) 11/15/2015   CHOL 144 01/02/2015   TRIG 131.0 01/02/2015   HDL 45.30 01/02/2015   LDLCALC 72 01/02/2015   ALT 11 10/31/2014   AST 12 10/31/2014   NA 138 11/15/2015   K 3.8 11/15/2015   CL 104 11/15/2015   CREATININE 1.20 11/15/2015   BUN 24 (H) 11/15/2015   CO2 26 11/15/2015   TSH 2.71 04/03/2015   HGBA1C 7.2 (H) 11/15/2015   MICROALBUR 0.4 08/02/2009   Diabetes Health Maintenance Due  Topic Date Due  . FOOT EXAM  01/08/2016  . OPHTHALMOLOGY EXAM  04/09/2016  . HEMOGLOBIN A1C  05/15/2016   Wt Readings from Last 3 Encounters:  11/23/15 287 lb 9.6 oz (130.5 kg)  08/21/15 290 lb 3.2 oz (131.6 kg)  04/20/15 294 lb (133.4 kg)   BP Readings from Last 3 Encounters:  11/23/15 140/60  09/04/15 (!) 149/65  08/21/15 132/78    ASSESSMENT AND PLAN:  Discussed the following assessment and plan:  Type 2 diabetes mellitus without complication, without long-term current use of insulin (HCC) - Adequate control but would like her to do better. She agrees.  Morbid obesity ([place])  Hyperuricemia - Reviewed with patient diet trial of changing ramipril losart see if uric acid is better. Discussed other options such as allopurinol you [place]  etc.  Hx of gout  Need for vaccination with 13-polyvalent pneumococcal conjugate vaccine - Plan: Pneumococcal conjugate vaccine 13-valent  Essential hypertension - change to losartan trial Total visit 99mins > 50% spent counseling and coordinating care as indicated in above note and in instructions to patient . Regarding her hyperuricemia options for gout if she gets recurrent symptomatic gout than she should be on suppression. At this time would not stop her diuretic tried to change to a year. uricosuria  medication  -Patient advised to return or notify health care team  if symptoms worsen ,persist or new concerns  arise.  Patient Instructions   Uric acid is high if recurrent  Sx we can add a medication to help reducing  Level and thus decrease gout possiblitiy .   Diuretics can  Cause   Increase uric acid . But not necessarily stop this . I think [place] is ok .   Diabetes about the same     Not sure why   You have borderline anemia .   Plan  Labs in 3-4 months  Or as needed   Wt Readings from Last 3 Encounters:  11/23/15 287 lb 9.6 oz (130.5 kg)  08/21/15 290 lb 3.2 oz (131.6 kg)  04/20/15 294 lb (133.4 kg)       Mariann Laster K. Eliyanna Ault M.D.

## 2015-11-23 ENCOUNTER — Telehealth: Payer: Self-pay | Admitting: Internal Medicine

## 2015-11-23 ENCOUNTER — Encounter: Payer: Self-pay | Admitting: Internal Medicine

## 2015-11-23 ENCOUNTER — Ambulatory Visit (INDEPENDENT_AMBULATORY_CARE_PROVIDER_SITE_OTHER): Payer: Medicare Other | Admitting: Internal Medicine

## 2015-11-23 VITALS — BP 140/60 | Temp 98.7°F | Ht 63.0 in | Wt 287.6 lb

## 2015-11-23 DIAGNOSIS — I10 Essential (primary) hypertension: Secondary | ICD-10-CM

## 2015-11-23 DIAGNOSIS — E119 Type 2 diabetes mellitus without complications: Secondary | ICD-10-CM | POA: Diagnosis not present

## 2015-11-23 DIAGNOSIS — E790 Hyperuricemia without signs of inflammatory arthritis and tophaceous disease: Secondary | ICD-10-CM

## 2015-11-23 DIAGNOSIS — Z8739 Personal history of other diseases of the musculoskeletal system and connective tissue: Secondary | ICD-10-CM

## 2015-11-23 DIAGNOSIS — Z23 Encounter for immunization: Secondary | ICD-10-CM | POA: Diagnosis not present

## 2015-11-23 MED ORDER — GLUCOSE BLOOD VI STRP: ORAL_STRIP | 12 refills | Status: DC

## 2015-11-23 MED ORDER — ACCU-CHEK SOFT TOUCH LANCETS MISC: 12 refills | Status: DC

## 2015-11-23 MED ORDER — LOSARTAN POTASSIUM 100 MG PO TABS: 100.0000 mg | ORAL_TABLET | Freq: Every day | ORAL | 3 refills | Status: DC

## 2015-11-23 NOTE — Patient Instructions (Addendum)
Uric acid is high if recurrent  Sx we can add a medication to help reducing  Level and thus decrease gout possiblitiy .   Diuretics can  Cause   Increase uric acid . But not necessarily stop this . I think [place] is ok .   Diabetes about the same     Not sure why   You have borderline anemia .   Plan  Labs in 3-4 months  Or as needed   Wt Readings from Last 3 Encounters:  11/23/15 287 lb 9.6 oz (130.5 kg)  08/21/15 290 lb 3.2 oz (131.6 kg)  04/20/15 294 lb (133.4 kg)

## 2015-11-23 NOTE — Telephone Encounter (Signed)
Pt needs new rx accu chek aviva plus test strips #50 and lancets w/refills send to [facility] on [address]

## 2015-11-23 NOTE — Telephone Encounter (Signed)
Sent to the pharmacy by e-scribe. 

## 2015-11-26 ENCOUNTER — Other Ambulatory Visit: Payer: Self-pay

## 2015-11-26 MED ORDER — GLUCOSE BLOOD VI STRP: ORAL_STRIP | 12 refills | Status: DC

## 2016-02-19 ENCOUNTER — Other Ambulatory Visit: Payer: Medicare Other

## 2016-02-26 ENCOUNTER — Ambulatory Visit: Payer: Medicare Other | Admitting: Internal Medicine

## 2016-03-05 ENCOUNTER — Ambulatory Visit: Payer: Medicare Other | Admitting: Internal Medicine

## 2016-05-28 DIAGNOSIS — E119 Type 2 diabetes mellitus without complications: Secondary | ICD-10-CM | POA: Diagnosis not present

## 2016-05-28 DIAGNOSIS — Z6841 Body Mass Index (BMI) 40.0 and over, adult: Secondary | ICD-10-CM | POA: Diagnosis not present

## 2016-05-28 DIAGNOSIS — I10 Essential (primary) hypertension: Secondary | ICD-10-CM | POA: Diagnosis not present

## 2016-06-02 DIAGNOSIS — Z803 Family history of malignant neoplasm of breast: Secondary | ICD-10-CM | POA: Diagnosis not present

## 2016-06-02 DIAGNOSIS — Z1231 Encounter for screening mammogram for malignant neoplasm of breast: Secondary | ICD-10-CM | POA: Diagnosis not present

## 2016-06-02 LAB — HM MAMMOGRAPHY

## 2016-06-05 ENCOUNTER — Encounter: Payer: Self-pay | Admitting: Family Medicine

## 2016-06-11 ENCOUNTER — Encounter: Payer: Self-pay | Admitting: Internal Medicine

## 2016-06-11 ENCOUNTER — Other Ambulatory Visit: Payer: Self-pay | Admitting: Internal Medicine

## 2016-06-13 ENCOUNTER — Telehealth: Payer: Self-pay | Admitting: Internal Medicine

## 2016-06-13 NOTE — Telephone Encounter (Signed)
Sending in a month supply for the Potassium medication and [place]. Pt is over due for medication check OV. Please schedule.

## 2016-06-16 NOTE — Telephone Encounter (Signed)
Pt scheduled  

## 2016-06-16 NOTE — Telephone Encounter (Signed)
lmom for pt to make an appt

## 2016-07-03 ENCOUNTER — Other Ambulatory Visit: Payer: Self-pay | Admitting: Internal Medicine

## 2016-07-03 MED ORDER — LOSARTAN POTASSIUM 100 MG PO TABS: 100.0000 mg | ORAL_TABLET | Freq: Every day | ORAL | 3 refills | Status: DC

## 2016-07-18 DIAGNOSIS — E119 Type 2 diabetes mellitus without complications: Secondary | ICD-10-CM | POA: Diagnosis not present

## 2016-07-18 LAB — HM DIABETES EYE EXAM

## 2016-07-21 NOTE — Progress Notes (Signed)
Chief Complaint  Patient presents with  . Medication Management    HPI: Rebecca Rich 72 y.o. come in for Chronic disease management  Hx gout pre diabetes dm  Ht   obesiy and mild anemia   OSA She is overdue for her follow-up because she travels a lot. She did see Dr. Juliet Rude a couple months ago and her blood pressure was very high in the 190 range. He gave her amlodipine from months until she had a visit with [place]. Unfortunately I have no records about this medicine change at today's visit. She did bring all her bottles she has some leftover rami[pril e and losartan but she is not taking losartan because cardiology gave her amlodipine.  Has almost a full bottle  9 we had changed her cause of  Hx of gout attacks )  No attacks using cherry juice extract and some vinegar to avoid gout.  She is taking chlorthalidone and her potassium pills.  Thyroid staying on the 75 g. Thinks her diabetes is okay is a little bit stuck on getting weight loss continues to travel. No neuropathy cp sob.  ROS: See pertinent positives and negatives per HPI.  Past Medical History:  Diagnosis Date  . Acute bronchospasm 08/24/2009  . Acute sphenoidal sinusitis 02/26/2010   Qualifier: Diagnosis of  By: Regis Bill MD, Standley Brooking   . CARDIAC MURMUR 12/08/2006  . COLONIC POLYPS, HX OF 12/08/2006  . DIABETES MELLITUS, TYPE II 12/08/2006  . Gallbladder/common duct stone, without infection, with obstruction 03/01/2010   removed ercp  . HYPERLIPIDEMIA 12/08/2006  . HYPERTENSION 12/08/2006  . 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 INFECTION, SKIN AND SOFT TISSUE 07/28/2008  . 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 LIVER FUNCTION TESTS, ABNORMAL 07/28/2008  . Morbid obesity ([place]) 12/08/2006  . OBESITY 09/24/2009  . OBSTRUCTIVE SLEEP APNEA 12/08/2006  . OSTEOARTHRITIS 12/08/2006  . RUQ PAIN 06/16/2008  . SHOULDER PAIN, RIGHT 02/07/2008  . Sleep apnea   . Swelling of limb 07/28/2008  . THYROID FUNCTION TEST, ABNORMAL 12/08/2006    Family History    Problem Relation Age of Onset  . Other Mother        blood clots  . Cervical cancer Mother   . Cancer Mother   . Heart disease Brother   . Diabetes Brother   . Heart disease Sister   . Diabetes Sister   . Heart disease Brother   . Diabetes Brother   . Heart disease Brother   . Diabetes Brother   . Stroke Sister   . Diabetes Sister   . Throat cancer Father   . Cancer Father   . Diabetes Other        all siblings, 4 brothers, 5 sisters    Social History   Social History  . Marital status: Single    Spouse name: N/A  . Number of children: N/A  . Years of education: N/A   Occupational History  . retired Retired   Social History Main Topics  . Smoking status: Never Smoker  . Smokeless tobacco: Never Used  . Alcohol use No  . Drug use: No  . Sexual activity: Not Asked   Other Topics Concern  . None   Social History Narrative   Master level education in math   Pt is currently retired   Pt is divorced with children   Recently had to move had a break in and thus away from her pool exercise    Outpatient Medications Prior to Visit  Medication  Sig Dispense Refill  . chlorthalidone (HYGROTON) 25 MG tablet take 1 tablet by mouth once daily 30 tablet 0  . cholecalciferol (VITAMIN D) 1000 UNITS tablet Take 1,000 Units by mouth daily.    Marland Kitchen glucose blood (ACCU-CHEK ACTIVE STRIPS) test strip Use to test blood glucose twice daily 50 each 12  . glucose blood (ACCU-CHEK AVIVA PLUS) test strip Use to check blood sugar daily E11.9 100 each 12  . Lancets (ACCU-CHEK SOFT TOUCH) lancets Use to test blood glucose twice daily 50 each 12  . levothyroxine (SYNTHROID) 75 MCG tablet Take 1 tablet (75 mcg total) by mouth daily before breakfast. 90 tablet 3  . Misc Natural Products (OSTEO BI-FLEX ADV JOINT SHIELD PO) Take 1 tablet by mouth daily.    . Multiple Vitamins-Minerals (CENTRUM SILVER PO) Take 1 tablet by mouth daily.      . potassium chloride (K-DUR,KLOR-CON) 10 MEQ tablet take 2  tablets by mouth once daily 60 tablet 0  . AMLODIPINE BESYLATE PO Take 10 mg by mouth daily.    Marland Kitchen losartan (COZAAR) 100 MG tablet Take 1 tablet (100 mg total) by mouth daily. 90 tablet 3  . ramipril (ALTACE) 10 MG capsule Take 10 mg by mouth daily.     No facility-administered medications prior to visit.      EXAM:  BP 130/60 (BP Location: Right Arm, Patient Position: Sitting, Cuff Size: Large)   Pulse 67   Temp 99 F (37.2 C) (Oral)   Wt 293 lb 12.8 oz (133.3 kg)   BMI 52.04 kg/m   Body mass index is 52.04 kg/m.  GENERAL: vitals reviewed and listed above, alert, oriented, appears well hydrated and in no acute distress HEENT: atraumatic, conjunctiva  clear, no obvious abnormalities on inspection of external nose and earsNECK: no obvious masses on inspection palpation  LUNGS: clear to auscultation bilaterally, no wheezes, rales or rhonchi,  CV: HRRR, no clubbing cyanosis or  peripheral edema nl cap refill  1-2 /6 sem usb non radiation  MS: moves all extremities without noticeable focal  abnormality PSYCH: pleasant and cooperative, no obvious depression or anxiety feet no ulcer some mild flaking   Feet   BP Readings from Last 3 Encounters:  07/22/16 130/60  11/23/15 140/60  09/04/15 (!) 149/65    ASSESSMENT AND PLAN:  Discussed the following assessment and plan:  Type 2 diabetes mellitus without complication, without long-term current use of insulin ([place]) - Plan: Basic metabolic panel, Hemoglobin A1c, TSH, Lipid panel, CBC with Differential/Platelet  Hx of gout - Plan: Basic metabolic panel, Hemoglobin A1c, TSH, Lipid panel, CBC with Differential/Platelet, Uric acid  Morbid obesity ([place]) - Plan: Basic metabolic panel, Hemoglobin A1c, TSH, Lipid panel, CBC with Differential/Platelet  Hyperuricemia - Plan: Basic metabolic panel, Hemoglobin A1c, TSH, Lipid panel, CBC with Differential/Platelet, Uric acid  Essential hypertension - Plan: Basic metabolic panel, Hemoglobin A1c,  TSH, Lipid panel, CBC with Differential/Platelet  Hypothyroidism, unspecified type - Plan: Basic metabolic panel, Hemoglobin A1c, TSH, Lipid panel, CBC with Differential/Platelet  Medication management - uncertain what plan dr Darnell Level card has in mind  sheis off arb and ace  ? per cards at thsi time?  Change in ht med since last time and  She is no longer on ace or arb   Uncertain if this was a cardiology  Plan or was to add on  ccp for ht control .   Is off  amlo for a month   Restart at 5 mg ( 1/2) of  10 mg per day for now .  Labs today  Get records dr G office  They are not on epic    Ned to decide on rx plan .  We did discuss morbid obesity and  No processed carbs for 3 weeks and then limit to get her started and thenROV in 3 month -Patient advised to return or notify health care team  if  new concerns arise. Prolonged visit  With med management  35 minutes  Patient Instructions   Will notify you  of labs when available.   Need  To get notes from dr Milderd Meager office   About medication  And he can  Do the med management of your  Hypertension   Until things figured  Out.    Labs  Today to see  Current  .   Wt Readings from Last 3 Encounters:  07/22/16 293 lb 12.8 oz (133.3 kg)  11/23/15 287 lb 9.6 oz (130.5 kg)  08/21/15 290 lb 3.2 oz (131.6 kg)   We can refill the amlodipine as per dr Einar Gip.         Standley Brooking. Panosh M.D.

## 2016-07-22 ENCOUNTER — Telehealth: Payer: Self-pay | Admitting: Emergency Medicine

## 2016-07-22 ENCOUNTER — Ambulatory Visit (INDEPENDENT_AMBULATORY_CARE_PROVIDER_SITE_OTHER): Payer: Medicare Other | Admitting: Internal Medicine

## 2016-07-22 ENCOUNTER — Encounter: Payer: Self-pay | Admitting: Internal Medicine

## 2016-07-22 VITALS — BP 130/60 | HR 67 | Temp 99.0°F | Wt 293.8 lb

## 2016-07-22 DIAGNOSIS — I10 Essential (primary) hypertension: Secondary | ICD-10-CM | POA: Diagnosis not present

## 2016-07-22 DIAGNOSIS — E119 Type 2 diabetes mellitus without complications: Secondary | ICD-10-CM

## 2016-07-22 DIAGNOSIS — E790 Hyperuricemia without signs of inflammatory arthritis and tophaceous disease: Secondary | ICD-10-CM

## 2016-07-22 DIAGNOSIS — Z8739 Personal history of other diseases of the musculoskeletal system and connective tissue: Secondary | ICD-10-CM

## 2016-07-22 DIAGNOSIS — E039 Hypothyroidism, unspecified: Secondary | ICD-10-CM | POA: Diagnosis not present

## 2016-07-22 DIAGNOSIS — Z79899 Other long term (current) drug therapy: Secondary | ICD-10-CM | POA: Diagnosis not present

## 2016-07-22 LAB — CBC WITH DIFFERENTIAL/PLATELET
BASOS ABS: 0 10*3/uL (ref 0.0–0.1)
BASOS PCT: 0.4 % (ref 0.0–3.0)
EOS ABS: 0.2 10*3/uL (ref 0.0–0.7)
Eosinophils Relative: 3.1 % (ref 0.0–5.0)
HEMATOCRIT: 35.1 % — AB (ref 36.0–46.0)
Hemoglobin: 11.9 g/dL — ABNORMAL LOW (ref 12.0–15.0)
LYMPHS PCT: 25.7 % (ref 12.0–46.0)
Lymphs Abs: 1.3 10*3/uL (ref 0.7–4.0)
MCHC: 34 g/dL (ref 30.0–36.0)
MCV: 89 fl (ref 78.0–100.0)
Monocytes Absolute: 0.4 10*3/uL (ref 0.1–1.0)
Monocytes Relative: 8 % (ref 3.0–12.0)
Neutro Abs: 3.2 10*3/uL (ref 1.4–7.7)
Neutrophils Relative %: 62.8 % (ref 43.0–77.0)
Platelets: 144 10*3/uL — ABNORMAL LOW (ref 150.0–400.0)
RBC: 3.95 Mil/uL (ref 3.87–5.11)
RDW: 14.4 % (ref 11.5–15.5)
WBC: 5.1 10*3/uL (ref 4.0–10.5)

## 2016-07-22 LAB — LIPID PANEL
CHOLESTEROL: 133 mg/dL (ref 0–200)
HDL: 47.9 mg/dL (ref >39.00)
LDL CALC: 61 mg/dL (ref 0–99)
NONHDL: 85.06
Total CHOL/HDL Ratio: 3
Triglycerides: 118 mg/dL (ref 0.0–149.0)
VLDL: 23.6 mg/dL (ref 0.0–40.0)

## 2016-07-22 LAB — BASIC METABOLIC PANEL
BUN: 21 mg/dL (ref 6–23)
CHLORIDE: 106 meq/L (ref 96–112)
CO2: 26 mEq/L (ref 19–32)
Calcium: 9.1 mg/dL (ref 8.4–10.5)
Creatinine, Ser: 1.06 mg/dL (ref 0.40–1.20)
GFR: 65.3 mL/min (ref >60.00)
GLUCOSE: 138 mg/dL — AB (ref 70–99)
POTASSIUM: 3.6 meq/L (ref 3.5–5.1)
SODIUM: 140 meq/L (ref 135–145)

## 2016-07-22 LAB — URIC ACID: URIC ACID, SERUM: 9.8 mg/dL — AB (ref 2.4–7.0)

## 2016-07-22 LAB — TSH: TSH: 2.93 u[IU]/mL (ref 0.35–4.50)

## 2016-07-22 LAB — HEMOGLOBIN A1C: HEMOGLOBIN A1C: 7.4 % — AB (ref 4.6–6.5)

## 2016-07-22 MED ORDER — AMLODIPINE BESYLATE 10 MG PO TABS: 5.0000 mg | ORAL_TABLET | Freq: Every day | ORAL | 3 refills | Status: DC

## 2016-07-22 NOTE — Patient Instructions (Addendum)
Will notify you  of labs when available.   Need  To get notes from dr Milderd Meager office   About medication  And he can  Do the med management of your  Hypertension   Until things figured  Out.    Labs  Today to see  Current  .   Wt Readings from Last 3 Encounters:  07/22/16 293 lb 12.8 oz (133.3 kg)  11/23/15 287 lb 9.6 oz (130.5 kg)  08/21/15 290 lb 3.2 oz (131.6 kg)   We can refill the amlodipine as per dr Einar Gip.

## 2016-07-22 NOTE — Telephone Encounter (Signed)
Asked front staff to fax over last office notes.

## 2016-08-06 ENCOUNTER — Encounter: Payer: Self-pay | Admitting: Family Medicine

## 2016-08-13 ENCOUNTER — Ambulatory Visit: Payer: Medicare Other | Admitting: Podiatry

## 2016-08-15 ENCOUNTER — Other Ambulatory Visit: Payer: Self-pay

## 2016-08-25 ENCOUNTER — Encounter: Payer: Self-pay | Admitting: Emergency Medicine

## 2016-08-26 ENCOUNTER — Other Ambulatory Visit: Payer: Self-pay

## 2016-08-26 MED ORDER — ACCU-CHEK SOFT TOUCH LANCETS MISC: 12 refills | Status: AC

## 2016-08-26 MED ORDER — GLUCOSE BLOOD VI STRP: ORAL_STRIP | 12 refills | Status: AC

## 2016-09-09 ENCOUNTER — Ambulatory Visit (INDEPENDENT_AMBULATORY_CARE_PROVIDER_SITE_OTHER): Payer: Medicare Other | Admitting: Podiatry

## 2016-09-09 ENCOUNTER — Encounter: Payer: Self-pay | Admitting: Podiatry

## 2016-09-09 VITALS — BP 177/69 | HR 78

## 2016-09-09 DIAGNOSIS — E119 Type 2 diabetes mellitus without complications: Secondary | ICD-10-CM | POA: Diagnosis not present

## 2016-09-09 NOTE — Progress Notes (Signed)
   Subjective:    Patient ID: Rebecca Rich, female    DOB: 1945-04-20, 71 y.o.   MRN: [id number]  HPI This patient presents today requesting diabetic foot examination. Patient states that her previously resolve right great toe joint pain and are left toe joint pain have resolved. Patient denies any history of foot ulceration, claudication or amputation Patient denies smoking history   Review of Systems  All other systems reviewed and are negative.      Objective:   Physical Exam Patient states she is approximately 5 feet 11 inches and weighs approximately 272 pounds  Orientated 3  Vascular: No calf edema or calf tenderness bilaterally DP and PT pulses 2/4 bilaterally Capillary reflex immediate bilaterally  Neurological: Sensation to 10 g monofilament wire intact 9/9 right and 8/9 left Vibratory sensation reactive bilaterally Ankle reflexes reactive bilaterally  Dermatological: No open skin lesions bilaterally Absent hair growth bilaterally The toenails are elongated, hypertrophic, discolored 6-10  Musculoskeletal: There is no restriction in range of motion ankle, subtalar, midtarsal joints bilaterally Manual motor testing dorsi flexion, plantar flexion, inversion, eversion 5/5 bilaterally          Assessment & Plan:   Assessment: Diabetic with satisfactory neurovascular status Diabetic without foot complications 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

## 2016-09-09 NOTE — Patient Instructions (Signed)
Today her diabetic foot screen demonstrated good circulation and feeling in your feet Your previous history of pain right and left feet has resolved Return as needed for any specific problems or yearly  Diabetes and Foot Care Diabetes may cause you to have problems because of poor blood supply (circulation) to your feet and legs. This may cause the skin on your feet to become thinner, break easier, and heal more slowly. Your skin may become dry, and the skin may peel and crack. You may also have nerve damage in your legs and feet causing decreased feeling in them. You may not notice minor injuries to your feet that could lead to infections or more serious problems. Taking care of your feet is one of the most important things you can do for yourself. Follow these instructions at home:  Wear shoes at all times, even in the house. Do not go barefoot. Bare feet are easily injured.  Check your feet daily for blisters, cuts, and redness. If you cannot see the bottom of your feet, use a mirror or ask someone for help.  Wash your feet with warm water (do not use hot water) and mild soap. Then pat your feet and the areas between your toes until they are completely dry. Do not soak your feet as this can dry your skin.  Apply a moisturizing lotion or petroleum jelly (that does not contain alcohol and is unscented) to the skin on your feet and to dry, brittle toenails. Do not apply lotion between your toes.  Trim your toenails straight across. Do not dig under them or around the cuticle. File the edges of your nails with an emery board or nail file.  Do not cut corns or calluses or try to remove them with medicine.  Wear clean socks or stockings every day. Make sure they are not too tight. Do not wear knee-high stockings since they may decrease blood flow to your legs.  Wear shoes that fit properly and have enough cushioning. To break in new shoes, wear them for just a few hours a day. This prevents you  from injuring your feet. Always look in your shoes before you put them on to be sure there are no objects inside.  Do not cross your legs. This may decrease the blood flow to your feet.  If you find a minor scrape, cut, or break in the skin on your feet, keep it and the skin around it clean and dry. These areas may be cleansed with mild soap and water. Do not cleanse the area with peroxide, alcohol, or iodine.  When you remove an adhesive bandage, be sure not to damage the skin around it.  If you have a wound, look at it several times a day to make sure it is healing.  Do not use heating pads or hot water bottles. They may burn your skin. If you have lost feeling in your feet or legs, you may not know it is happening until it is too late.  Make sure your health care provider performs a complete foot exam at least annually or more often if you have foot problems. Report any cuts, sores, or bruises to your health care provider immediately. Contact a health care provider if:  You have an injury that is not healing.  You have cuts or breaks in the skin.  You have an ingrown nail.  You notice redness on your legs or feet.  You feel burning or tingling in your legs or  feet.  You have pain or cramps in your legs and feet.  Your legs or feet are numb.  Your feet always feel cold. Get help right away if:  There is increasing redness, swelling, or pain in or around a wound.  There is a red line that goes up your leg.  Pus is coming from a wound.  You develop a fever or as directed by your health care provider.  You notice a bad smell coming from an ulcer or wound. This information is not intended to replace advice given to you by your health care provider. Make sure you discuss any questions you have with your health care provider. Document Released: 01/11/2000 Document Revised: 06/21/2015 Document Reviewed: 06/22/2012 Elsevier Interactive Patient Education  2017 [employer].

## 2016-10-01 ENCOUNTER — Other Ambulatory Visit: Payer: Self-pay

## 2016-10-01 MED ORDER — LEVOTHYROXINE SODIUM 75 MCG PO TABS: 75.0000 ug | ORAL_TABLET | Freq: Every day | ORAL | 3 refills | Status: DC

## 2016-10-03 ENCOUNTER — Other Ambulatory Visit: Payer: Self-pay | Admitting: Emergency Medicine

## 2016-10-03 ENCOUNTER — Telehealth: Payer: Self-pay | Admitting: Internal Medicine

## 2016-10-03 MED ORDER — LEVOTHYROXINE SODIUM 75 MCG PO TABS: 75.0000 ug | ORAL_TABLET | Freq: Every day | ORAL | 3 refills | Status: DC

## 2016-10-03 NOTE — Telephone Encounter (Signed)
Medication has been sent to the correct pharmacy. Patient is aware. Nothing further needed

## 2016-10-03 NOTE — Telephone Encounter (Signed)
° ° ° °  The below med was sent to [facility] and pt is asking for the rx to [facility]   levothyroxine (SYNTHROID) 75 MCG tablet  [facility]

## 2016-10-03 NOTE — Telephone Encounter (Signed)
Pt needs for the to be resent to [facility] [address].   Pt is out of this medication and would like to have this called in today.

## 2016-10-09 ENCOUNTER — Telehealth: Payer: Self-pay | Admitting: Emergency Medicine

## 2016-10-09 MED ORDER — CHLORTHALIDONE 25 MG PO TABS: 25.0000 mg | ORAL_TABLET | Freq: Every day | ORAL | 1 refills | Status: DC

## 2016-10-09 NOTE — Telephone Encounter (Signed)
Hygroton 25 mg has been sent to patient pharmacy

## 2016-10-17 ENCOUNTER — Encounter: Payer: Self-pay | Admitting: Internal Medicine

## 2016-10-20 ENCOUNTER — Telehealth: Payer: Self-pay | Admitting: Internal Medicine

## 2016-10-20 NOTE — Telephone Encounter (Signed)
° ° ° ° °  Pt call to say she need a letter fax over to [place] that she has arthritis nd you are giving her permission to take there water aerobics class.   Pt will call back with fax number

## 2016-10-22 NOTE — Telephone Encounter (Signed)
Would it be okay to write letter with the current dx of osteoarthritis under patient problem list for [facility]? Please advise. Thank you

## 2016-10-23 NOTE — Telephone Encounter (Signed)
FYI

## 2016-10-23 NOTE — Telephone Encounter (Signed)
Please have Dr. Regis Bill write this letter when she returns

## 2016-10-27 NOTE — Telephone Encounter (Signed)
Please advise on first phone message at the bottom

## 2016-10-27 NOTE — Telephone Encounter (Signed)
Yes  She was seen by orhto and podiatry  And has arthritis  in feet

## 2016-10-28 DIAGNOSIS — R202 Paresthesia of skin: Secondary | ICD-10-CM | POA: Diagnosis not present

## 2016-10-28 DIAGNOSIS — R072 Precordial pain: Secondary | ICD-10-CM | POA: Diagnosis not present

## 2016-10-28 DIAGNOSIS — M47812 Spondylosis without myelopathy or radiculopathy, cervical region: Secondary | ICD-10-CM | POA: Diagnosis not present

## 2016-10-28 DIAGNOSIS — R0789 Other chest pain: Secondary | ICD-10-CM | POA: Diagnosis not present

## 2016-10-28 NOTE — Telephone Encounter (Signed)
Letter done but no fax number in previous message. Left message on machine to verify fax number this letter needs to be sent to

## 2016-10-30 ENCOUNTER — Ambulatory Visit: Payer: Medicare Other | Admitting: Internal Medicine

## 2016-10-30 NOTE — Telephone Encounter (Signed)
Left message on machine to call back  

## 2016-11-03 NOTE — Progress Notes (Signed)
Chief Complaint  Patient presents with  . Follow-up    Pt is taking Amlodipine 10mg  but is only taking 1/2 tablet. Feels that this medication is not as effective as the previous medications Ramipril and Losartan. Pts BP readings have been reading in the 150s range. Pt also notes some dizzy spells since starting Amlodipine.     HPI: Rebecca Rich 71 y.o. come in for Chronic disease management  Water aerobics.  Not too many dietary issues  herbalife   Losing weight   Bp on amlodi[pine 1/2 of 10 mg  And felt dizzy getting better but just feels OFF  And bp creeping up to 150 frange . She is still on Chlorthalidone and potassium   . Feels did beter on  aramapril   BG no checking   Obesity working on it.   Now has   LUE predicament   Fixed trigger fingeer and now  Numbness off and on left  4 and 5 fingers   Pain tingling arm ?  At elbow or other   Went to [facility]  [address] and saw the internal medicine PA  S [place]  there who gave her meloxicam 15 mg to take once a day and told if it doesn't calm down to come see her PCP. She describes it as some aching and pain on her left arm and side with some tingling numbness in her fourth and pinky finger-o known trauma she does lay on that elbow watching TV but no other fall trauma overuse.   ROS: See pertinent positives and negatives per HPI. No cp sob syncope   Past Medical History:  Diagnosis Date  . Acute bronchospasm 08/24/2009  . Acute sphenoidal sinusitis 02/26/2010   Qualifier: Diagnosis of  By: Regis Bill MD, Standley Brooking   . CARDIAC MURMUR 12/08/2006  . COLONIC POLYPS, HX OF 12/08/2006  . DIABETES MELLITUS, TYPE II 12/08/2006  . Gallbladder/common duct stone, without infection, with obstruction 03/01/2010   removed ercp  . HYPERLIPIDEMIA 12/08/2006  . HYPERTENSION 12/08/2006  . 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 INFECTION, SKIN AND SOFT TISSUE 07/28/2008  . 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 LIVER FUNCTION TESTS, ABNORMAL 07/28/2008  .  Morbid obesity ([place]) 12/08/2006  . OBESITY 09/24/2009  . OBSTRUCTIVE SLEEP APNEA 12/08/2006  . OSTEOARTHRITIS 12/08/2006  . RUQ PAIN 06/16/2008  . SHOULDER PAIN, RIGHT 02/07/2008  . Sleep apnea   . Swelling of limb 07/28/2008  . THYROID FUNCTION TEST, ABNORMAL 12/08/2006    Family History  Problem Relation Age of Onset  . Other Mother        blood clots  . Cervical cancer Mother   . Cancer Mother   . Heart disease Brother   . Diabetes Brother   . Heart disease Sister   . Diabetes Sister   . Heart disease Brother   . Diabetes Brother   . Heart disease Brother   . Diabetes Brother   . Stroke Sister   . Diabetes Sister   . Throat cancer Father   . Cancer Father   . Diabetes Other        all siblings, 4 brothers, 5 sisters    Social History   Social History  . Marital status: Single    Spouse name: N/A  . Number of children: N/A  . Years of education: N/A   Occupational History  . retired Retired   Social History Main Topics  . Smoking status: Never Smoker  . Smokeless tobacco: Never Used  .  Alcohol use No  . Drug use: No  . Sexual activity: Not Asked   Other Topics Concern  . None   Social History Narrative   Master level education in math   Pt is currently retired   Pt is divorced with children   Recently had to move had a break in and thus away from her pool exercise    Outpatient Medications Prior to Visit  Medication Sig Dispense Refill  . chlorthalidone (HYGROTON) 25 MG tablet Take 1 tablet (25 mg total) by mouth daily. 90 tablet 1  . cholecalciferol (VITAMIN D) 1000 UNITS tablet Take 1,000 Units by mouth daily.    Marland Kitchen glucose blood (ACCU-CHEK AVIVA PLUS) test strip Use to check blood sugar daily E11.9 100 each 12  . Lancets (ACCU-CHEK SOFT TOUCH) lancets Use to test blood glucose twice daily 50 each 12  . levothyroxine (SYNTHROID) 75 MCG tablet Take 1 tablet (75 mcg total) by mouth daily before breakfast. 90 tablet 3  . Misc Natural Products (OSTEO  BI-FLEX ADV JOINT SHIELD PO) Take 1 tablet by mouth daily.    . Multiple Vitamins-Minerals (CENTRUM SILVER PO) Take 1 tablet by mouth daily.      . potassium chloride (K-DUR,KLOR-CON) 10 MEQ tablet take 2 tablets by mouth once daily 60 tablet 0  . amLODipine (NORVASC) 10 MG tablet Take 0.5-1 tablets (5-10 mg total) by mouth daily. As directed 30 tablet 3  . AMLODIPINE BESYLATE PO Take 10 mg by mouth daily.    Marland Kitchen losartan (COZAAR) 100 MG tablet Take 1 tablet (100 mg total) by mouth daily. (Patient not taking: Reported on 11/04/2016) 90 tablet 3  . ramipril (ALTACE) 10 MG capsule Take 10 mg by mouth daily.     No facility-administered medications prior to visit.      EXAM:  BP 134/78 (BP Location: Right Wrist, Patient Position: Sitting, Cuff Size: Normal)   Pulse 75   Temp 98.2 F (36.8 C) (Oral)   Wt 279 lb 3.2 oz (126.6 kg)   BMI 49.46 kg/m   Body mass index is 49.46 kg/m.  GENERAL: vitals reviewed and listed above, alert, oriented, appears well hydrated and in no acute distress HEENT: atraumatic, conjunctiva  clear, no obvious abnormalities on inspection of external nose and ears  NECK: no obvious masses on inspection palpation  LUNGS: clear to auscultation bilaterally, no wheezes, rales or rhonchi, good air movement CV: HRRR, no clubbing cyanosis or  peripheral edema nl cap refill   2 sem upper chest no radiation MS: moves all extremities without noticeable focal  Abnormality left hand no atrophy swollen  Trigger middle finger   No  Nl perfusion  Grip seems  Nl    PSYCH: pleasant and cooperative, no obvious depression or anxiety Lab Results  Component Value Date   WBC 5.1 07/22/2016   HGB 11.9 (L) 07/22/2016   HCT 35.1 (L) 07/22/2016   PLT 144.0 (L) 07/22/2016   GLUCOSE 138 (H) 07/22/2016   CHOL 133 07/22/2016   TRIG 118.0 07/22/2016   HDL 47.90 07/22/2016   LDLCALC 61 07/22/2016   ALT 11 10/31/2014   AST 12 10/31/2014   NA 140 07/22/2016   K 3.6 07/22/2016   CL 106  07/22/2016   CREATININE 1.06 07/22/2016   BUN 21 07/22/2016   CO2 26 07/22/2016   TSH 2.93 07/22/2016   HGBA1C 6.6 11/04/2016   MICROALBUR 0.4 08/02/2009   BP Readings from Last 3 Encounters:  11/04/16 134/78  09/09/16 (!) 177/69  07/22/16 130/60   Wt Readings from Last 3 Encounters:  11/04/16 279 lb 3.2 oz (126.6 kg)  07/22/16 293 lb 12.8 oz (133.3 kg)  11/23/15 287 lb 9.6 oz (130.5 kg)    ASSESSMENT AND PLAN:  Discussed the following assessment and plan:  Type 2 diabetes mellitus without complication, without long-term current use of insulin ([facility]) - much improved   cont lsi . - Plan: Basic metabolic panel, Uric Acid, CBC with Differential/Platelet, POC HgB A1c  Essential hypertension - stopping amlo her preference and add losartan with bmp 3 weeks caution with poassium - Plan: Basic metabolic panel, Uric Acid, CBC with Differential/Platelet  Hyperuricemia - Plan: Basic metabolic panel, Uric Acid, CBC with Differential/Platelet  Morbid obesity ([facility]) - Plan: Basic metabolic panel, Uric Acid, CBC with Differential/Platelet  Medication management - Plan: Basic metabolic panel, Uric Acid, CBC with Differential/Platelet  Numbness of fingers? ulnar sx  - poss compression sx  disc position and cautions and fu if ongoing  short  term use of mobic  risk benefit  Hx of gout   a1c  Much better  14# weight loss since June Went over blood pressure medications again. Having some what sounds like minor side effects of the 5 mg of amlodipine which is a half of the 10 mg :r she preferred to go back on one of the others. meds  She understands to not go not go back on ramipril and losartan. Losartan may help uric acid level  She will stop the amlodipine and begin losartan 100 mg take a half once a day and can increase to a whole pill a day. We'll stay on her diuretic and potassium recheck BMP in about 3 weeks on this regimen. We will have her come back in 3 months or earlier as needed if  blood pressures not controlled or other concerns.  Will send information to Dr Einar Gip Poss ulnar neuropathy  Conservative   Care and if  persistent or progressive contact us for referral to dr Fredna Dow -Patient advised to return or notify health care team  if  new concerns arise.  Patient Instructions   We can go back to losartan   50 mg per day and increae to 100 mg per day   And stop the amlodipine   For now    Stay on the  Diuretic and potassium and we need to get  Lab chemistry bmp in 3 weeks  Avoid elbow   Flexion. As possible  And if  Ongoing another 3-4 weeks without improvement or any weakness   The call us and we can refer to Dr Fredna Dow .     This may be compression or stretching of the ulnar nerve.    Standley Brooking. Panosh M.D.

## 2016-11-04 ENCOUNTER — Ambulatory Visit (INDEPENDENT_AMBULATORY_CARE_PROVIDER_SITE_OTHER): Payer: Medicare Other | Admitting: Internal Medicine

## 2016-11-04 ENCOUNTER — Encounter: Payer: Self-pay | Admitting: Internal Medicine

## 2016-11-04 VITALS — BP 134/78 | HR 75 | Temp 98.2°F | Wt 279.2 lb

## 2016-11-04 DIAGNOSIS — I10 Essential (primary) hypertension: Secondary | ICD-10-CM

## 2016-11-04 DIAGNOSIS — Z79899 Other long term (current) drug therapy: Secondary | ICD-10-CM | POA: Diagnosis not present

## 2016-11-04 DIAGNOSIS — R200 Anesthesia of skin: Secondary | ICD-10-CM | POA: Diagnosis not present

## 2016-11-04 DIAGNOSIS — E119 Type 2 diabetes mellitus without complications: Secondary | ICD-10-CM | POA: Diagnosis not present

## 2016-11-04 DIAGNOSIS — E790 Hyperuricemia without signs of inflammatory arthritis and tophaceous disease: Secondary | ICD-10-CM

## 2016-11-04 DIAGNOSIS — Z8739 Personal history of other diseases of the musculoskeletal system and connective tissue: Secondary | ICD-10-CM

## 2016-11-04 LAB — POCT GLYCOSYLATED HEMOGLOBIN (HGB A1C): Hemoglobin A1C: 6.6

## 2016-11-04 MED ORDER — LOSARTAN POTASSIUM 100 MG PO TABS: 100.0000 mg | ORAL_TABLET | Freq: Every day | ORAL | 1 refills | Status: DC

## 2016-11-04 NOTE — Patient Instructions (Addendum)
  We can go back to losartan   50 mg per day and increae to 100 mg per day   And stop the amlodipine   For now    Stay on the  Diuretic and potassium and we need to get  Lab chemistry bmp in 3 weeks  Avoid elbow   Flexion. As possible  And if  Ongoing another 3-4 weeks without improvement or any weakness   The call us and we can refer to Dr Fredna Dow .     This may be compression or stretching of the ulnar nerve.

## 2016-11-05 NOTE — Telephone Encounter (Signed)
LM requesting fax number that letter needs to be sent to.

## 2016-11-11 DIAGNOSIS — H2513 Age-related nuclear cataract, bilateral: Secondary | ICD-10-CM | POA: Diagnosis not present

## 2016-11-11 DIAGNOSIS — H43811 Vitreous degeneration, right eye: Secondary | ICD-10-CM | POA: Diagnosis not present

## 2016-11-11 DIAGNOSIS — E119 Type 2 diabetes mellitus without complications: Secondary | ICD-10-CM | POA: Diagnosis not present

## 2016-11-18 ENCOUNTER — Other Ambulatory Visit: Payer: Self-pay | Admitting: Internal Medicine

## 2016-11-18 NOTE — Telephone Encounter (Signed)
Left detailed message requesting patient to return call if letter still needed to be faxed. Several attempts to reach patient to get fax number with no returned calls. Asked that the patient restate reason for call if calling back. Nothing further needed.

## 2016-11-25 ENCOUNTER — Other Ambulatory Visit (INDEPENDENT_AMBULATORY_CARE_PROVIDER_SITE_OTHER): Payer: Medicare Other

## 2016-11-25 DIAGNOSIS — E790 Hyperuricemia without signs of inflammatory arthritis and tophaceous disease: Secondary | ICD-10-CM | POA: Diagnosis not present

## 2016-11-25 DIAGNOSIS — Z79899 Other long term (current) drug therapy: Secondary | ICD-10-CM

## 2016-11-25 DIAGNOSIS — I10 Essential (primary) hypertension: Secondary | ICD-10-CM

## 2016-11-25 DIAGNOSIS — E119 Type 2 diabetes mellitus without complications: Secondary | ICD-10-CM | POA: Diagnosis not present

## 2016-11-25 LAB — CBC WITH DIFFERENTIAL/PLATELET
Basophils Absolute: 0 10*3/uL (ref 0.0–0.1)
Basophils Relative: 0.5 % (ref 0.0–3.0)
EOS PCT: 6 % — AB (ref 0.0–5.0)
Eosinophils Absolute: 0.3 10*3/uL (ref 0.0–0.7)
HCT: 36.6 % (ref 36.0–46.0)
Hemoglobin: 12.1 g/dL (ref 12.0–15.0)
LYMPHS ABS: 1.7 10*3/uL (ref 0.7–4.0)
Lymphocytes Relative: 33.7 % (ref 12.0–46.0)
MCHC: 32.9 g/dL (ref 30.0–36.0)
MCV: 89.7 fl (ref 78.0–100.0)
MONO ABS: 0.5 10*3/uL (ref 0.1–1.0)
Monocytes Relative: 8.9 % (ref 3.0–12.0)
NEUTROS ABS: 2.6 10*3/uL (ref 1.4–7.7)
NEUTROS PCT: 50.9 % (ref 43.0–77.0)
PLATELETS: 157 10*3/uL (ref 150.0–400.0)
RBC: 4.08 Mil/uL (ref 3.87–5.11)
RDW: 14.7 % (ref 11.5–15.5)
WBC: 5.1 10*3/uL (ref 4.0–10.5)

## 2016-11-25 LAB — BASIC METABOLIC PANEL
BUN: 16 mg/dL (ref 6–23)
CHLORIDE: 107 meq/L (ref 96–112)
CO2: 26 mEq/L (ref 19–32)
CREATININE: 1 mg/dL (ref 0.40–1.20)
Calcium: 9.2 mg/dL (ref 8.4–10.5)
GFR: 69.77 mL/min (ref >60.00)
Glucose, Bld: 119 mg/dL — ABNORMAL HIGH (ref 70–99)
Potassium: 3.8 mEq/L (ref 3.5–5.1)
Sodium: 142 mEq/L (ref 135–145)

## 2016-11-25 LAB — URIC ACID: URIC ACID, SERUM: 8.6 mg/dL — AB (ref 2.4–7.0)

## 2016-12-03 ENCOUNTER — Telehealth: Payer: Self-pay | Admitting: Internal Medicine

## 2016-12-03 NOTE — Telephone Encounter (Signed)
Dr. Regis Bill - please advise. It seems during her last office visit the medication was changed.

## 2016-12-03 NOTE — Telephone Encounter (Signed)
Pt needs amlodipine 10 mg #90 w/refills

## 2016-12-04 NOTE — Telephone Encounter (Signed)
My last office note does say to stop the amlodipine. Please contact the patient and states that the pharmacy contact us for refill and did she request this.? If so please document what is going on.

## 2016-12-05 NOTE — Telephone Encounter (Signed)
ATC, mailbox full, unable to leave voicemail. WCB

## 2016-12-10 NOTE — Telephone Encounter (Signed)
Pt states that she could not remember what medication she was supposed to stop taking. Pt states that when she called and requested the Amlodipine be filled she was not thinking about her last visit and what was said. Pt aware again to NOT take the Amlodipine; she states that she does not have any at home so she has not been taking any.  Nothing further needed.

## 2017-02-03 ENCOUNTER — Ambulatory Visit: Payer: Medicare Other | Admitting: Internal Medicine

## 2017-02-10 DIAGNOSIS — E119 Type 2 diabetes mellitus without complications: Secondary | ICD-10-CM | POA: Diagnosis not present

## 2017-02-10 DIAGNOSIS — H43812 Vitreous degeneration, left eye: Secondary | ICD-10-CM | POA: Diagnosis not present

## 2017-02-10 DIAGNOSIS — H43811 Vitreous degeneration, right eye: Secondary | ICD-10-CM | POA: Diagnosis not present

## 2017-02-10 DIAGNOSIS — H2513 Age-related nuclear cataract, bilateral: Secondary | ICD-10-CM | POA: Diagnosis not present

## 2017-02-10 LAB — HM DIABETES EYE EXAM

## 2017-02-15 ENCOUNTER — Encounter (HOSPITAL_COMMUNITY): Payer: Self-pay | Admitting: Emergency Medicine

## 2017-02-15 ENCOUNTER — Other Ambulatory Visit: Payer: Self-pay

## 2017-02-15 DIAGNOSIS — D122 Benign neoplasm of ascending colon: Secondary | ICD-10-CM | POA: Diagnosis not present

## 2017-02-15 DIAGNOSIS — E876 Hypokalemia: Secondary | ICD-10-CM | POA: Diagnosis present

## 2017-02-15 DIAGNOSIS — E785 Hyperlipidemia, unspecified: Secondary | ICD-10-CM | POA: Diagnosis present

## 2017-02-15 DIAGNOSIS — K5730 Diverticulosis of large intestine without perforation or abscess without bleeding: Secondary | ICD-10-CM | POA: Diagnosis present

## 2017-02-15 DIAGNOSIS — D62 Acute posthemorrhagic anemia: Secondary | ICD-10-CM | POA: Diagnosis not present

## 2017-02-15 DIAGNOSIS — E119 Type 2 diabetes mellitus without complications: Secondary | ICD-10-CM | POA: Diagnosis not present

## 2017-02-15 DIAGNOSIS — K922 Gastrointestinal hemorrhage, unspecified: Secondary | ICD-10-CM | POA: Diagnosis not present

## 2017-02-15 DIAGNOSIS — Z79899 Other long term (current) drug therapy: Secondary | ICD-10-CM

## 2017-02-15 DIAGNOSIS — G4733 Obstructive sleep apnea (adult) (pediatric): Secondary | ICD-10-CM | POA: Diagnosis present

## 2017-02-15 DIAGNOSIS — K5791 Diverticulosis of intestine, part unspecified, without perforation or abscess with bleeding: Secondary | ICD-10-CM | POA: Diagnosis not present

## 2017-02-15 DIAGNOSIS — I10 Essential (primary) hypertension: Secondary | ICD-10-CM | POA: Diagnosis present

## 2017-02-15 DIAGNOSIS — E039 Hypothyroidism, unspecified: Secondary | ICD-10-CM | POA: Diagnosis present

## 2017-02-15 DIAGNOSIS — Z9989 Dependence on other enabling machines and devices: Secondary | ICD-10-CM

## 2017-02-15 DIAGNOSIS — K625 Hemorrhage of anus and rectum: Secondary | ICD-10-CM | POA: Diagnosis not present

## 2017-02-15 DIAGNOSIS — Z6841 Body Mass Index (BMI) 40.0 and over, adult: Secondary | ICD-10-CM

## 2017-02-15 LAB — COMPREHENSIVE METABOLIC PANEL
ALBUMIN: 3.5 g/dL (ref 3.5–5.0)
ALK PHOS: 86 U/L (ref 38–126)
ALT: 16 U/L (ref 14–54)
ANION GAP: 13 (ref 5–15)
AST: 20 U/L (ref 15–41)
BILIRUBIN TOTAL: 0.6 mg/dL (ref 0.3–1.2)
BUN: 17 mg/dL (ref 6–20)
CALCIUM: 9.3 mg/dL (ref 8.9–10.3)
CO2: 22 mmol/L (ref 22–32)
Chloride: 102 mmol/L (ref 101–111)
Creatinine, Ser: 1.1 mg/dL — ABNORMAL HIGH (ref 0.44–1.00)
GFR calc Af Amer: 56 mL/min — ABNORMAL LOW (ref >60)
GFR calc non Af Amer: 49 mL/min — ABNORMAL LOW (ref >60)
GLUCOSE: 213 mg/dL — AB (ref 65–99)
Potassium: 3.3 mmol/L — ABNORMAL LOW (ref 3.5–5.1)
SODIUM: 137 mmol/L (ref 135–145)
Total Protein: 6.9 g/dL (ref 6.5–8.1)

## 2017-02-15 LAB — CBC
HEMATOCRIT: 37.8 % (ref 36.0–46.0)
HEMOGLOBIN: 12.5 g/dL (ref 12.0–15.0)
MCH: 29.9 pg (ref 26.0–34.0)
MCHC: 33.1 g/dL (ref 30.0–36.0)
MCV: 90.4 fL (ref 78.0–100.0)
Platelets: 151 10*3/uL (ref 150–400)
RBC: 4.18 MIL/uL (ref 3.87–5.11)
RDW: 13.9 % (ref 11.5–15.5)
WBC: 5.9 10*3/uL (ref 4.0–10.5)

## 2017-02-15 LAB — TYPE AND SCREEN
ABO/RH(D): B NEG
Antibody Screen: NEGATIVE

## 2017-02-15 NOTE — ED Triage Notes (Signed)
Pt c/o bright red blood x1 in the toilet after using the bathroom tonight. Hx of hemorrhoids. Denies chest pain/shortness of breath/weakness.

## 2017-02-16 ENCOUNTER — Inpatient Hospital Stay (HOSPITAL_COMMUNITY)
Admission: EM | Admit: 2017-02-16 | Discharge: 2017-02-18 | DRG: 378 | Disposition: A | Discharge status: Home / Self Care | Payer: Medicare Other | Attending: Internal Medicine | Admitting: Internal Medicine

## 2017-02-16 DIAGNOSIS — E1169 Type 2 diabetes mellitus with other specified complication: Secondary | ICD-10-CM

## 2017-02-16 DIAGNOSIS — E876 Hypokalemia: Secondary | ICD-10-CM | POA: Diagnosis present

## 2017-02-16 DIAGNOSIS — Z6841 Body Mass Index (BMI) 40.0 and over, adult: Secondary | ICD-10-CM | POA: Diagnosis not present

## 2017-02-16 DIAGNOSIS — D500 Iron deficiency anemia secondary to blood loss (chronic): Secondary | ICD-10-CM | POA: Diagnosis not present

## 2017-02-16 DIAGNOSIS — E039 Hypothyroidism, unspecified: Secondary | ICD-10-CM | POA: Diagnosis not present

## 2017-02-16 DIAGNOSIS — K625 Hemorrhage of anus and rectum: Secondary | ICD-10-CM | POA: Diagnosis not present

## 2017-02-16 DIAGNOSIS — K922 Gastrointestinal hemorrhage, unspecified: Secondary | ICD-10-CM

## 2017-02-16 DIAGNOSIS — I10 Essential (primary) hypertension: Secondary | ICD-10-CM | POA: Diagnosis not present

## 2017-02-16 DIAGNOSIS — K5791 Diverticulosis of intestine, part unspecified, without perforation or abscess with bleeding: Secondary | ICD-10-CM

## 2017-02-16 DIAGNOSIS — E669 Obesity, unspecified: Secondary | ICD-10-CM

## 2017-02-16 DIAGNOSIS — G4733 Obstructive sleep apnea (adult) (pediatric): Secondary | ICD-10-CM | POA: Diagnosis present

## 2017-02-16 DIAGNOSIS — E119 Type 2 diabetes mellitus without complications: Secondary | ICD-10-CM | POA: Diagnosis not present

## 2017-02-16 DIAGNOSIS — Z9989 Dependence on other enabling machines and devices: Secondary | ICD-10-CM

## 2017-02-16 DIAGNOSIS — E785 Hyperlipidemia, unspecified: Secondary | ICD-10-CM | POA: Diagnosis present

## 2017-02-16 DIAGNOSIS — D126 Benign neoplasm of colon, unspecified: Secondary | ICD-10-CM | POA: Diagnosis not present

## 2017-02-16 DIAGNOSIS — Z86010 Personal history of colonic polyps: Secondary | ICD-10-CM | POA: Diagnosis not present

## 2017-02-16 DIAGNOSIS — D62 Acute posthemorrhagic anemia: Secondary | ICD-10-CM | POA: Diagnosis present

## 2017-02-16 DIAGNOSIS — K5730 Diverticulosis of large intestine without perforation or abscess without bleeding: Secondary | ICD-10-CM | POA: Diagnosis not present

## 2017-02-16 DIAGNOSIS — D122 Benign neoplasm of ascending colon: Secondary | ICD-10-CM | POA: Diagnosis not present

## 2017-02-16 DIAGNOSIS — Z79899 Other long term (current) drug therapy: Secondary | ICD-10-CM | POA: Diagnosis not present

## 2017-02-16 HISTORY — DX: Gastrointestinal hemorrhage, unspecified: K92.2

## 2017-02-16 LAB — POC OCCULT BLOOD, ED: Fecal Occult Bld: POSITIVE — AB

## 2017-02-16 LAB — HEMATOCRIT
HEMATOCRIT: 35.2 % — AB (ref 36.0–46.0)
HEMATOCRIT: 34.5 % — AB (ref 36.0–46.0)

## 2017-02-16 LAB — CBG MONITORING, ED
GLUCOSE-CAPILLARY: 125 mg/dL — AB (ref 65–99)
Glucose-Capillary: 155 mg/dL — ABNORMAL HIGH (ref 65–99)

## 2017-02-16 LAB — I-STAT CHEM 8, ED
BUN: 16 mg/dL (ref 6–20)
CREATININE: 1 mg/dL (ref 0.44–1.00)
Calcium, Ion: 1.11 mmol/L — ABNORMAL LOW (ref 1.15–1.40)
Chloride: 114 mmol/L — ABNORMAL HIGH (ref 101–111)
Glucose, Bld: 155 mg/dL — ABNORMAL HIGH (ref 65–99)
HEMATOCRIT: 31 % — AB (ref 36.0–46.0)
HEMOGLOBIN: 10.5 g/dL — AB (ref 12.0–15.0)
POTASSIUM: 3.5 mmol/L (ref 3.5–5.1)
SODIUM: 136 mmol/L (ref 135–145)
TCO2: 29 mmol/L (ref 22–32)

## 2017-02-16 LAB — HEMOGLOBIN
Hemoglobin: 11.4 g/dL — ABNORMAL LOW (ref 12.0–15.0)
Hemoglobin: 11.3 g/dL — ABNORMAL LOW (ref 12.0–15.0)

## 2017-02-16 LAB — GLUCOSE, CAPILLARY: Glucose-Capillary: 110 mg/dL — ABNORMAL HIGH (ref 65–99)

## 2017-02-16 LAB — ABO/RH: ABO/RH(D): B NEG

## 2017-02-16 MED ORDER — SODIUM CHLORIDE 0.9 % IV SOLN
INTRAVENOUS | Status: DC
  Administered 2017-02-16: 23:00:00 via INTRAVENOUS

## 2017-02-16 MED ORDER — HYDRALAZINE HCL 20 MG/ML IJ SOLN
10.0000 mg | INTRAMUSCULAR | Status: DC | PRN
  Administered 2017-02-18: 10 mg via INTRAVENOUS
  Filled 2017-02-16: qty 1

## 2017-02-16 MED ORDER — SODIUM CHLORIDE 0.9% FLUSH
3.0000 mL | Freq: Two times a day (BID) | INTRAVENOUS | Status: DC
  Administered 2017-02-16 – 2017-02-18 (×4): 3 mL via INTRAVENOUS

## 2017-02-16 MED ORDER — SODIUM CHLORIDE 0.9 % IV BOLUS (SEPSIS)
1000.0000 mL | Freq: Once | INTRAVENOUS | Status: AC
  Administered 2017-02-16: 1000 mL via INTRAVENOUS

## 2017-02-16 MED ORDER — PANTOPRAZOLE SODIUM 40 MG IV SOLR
40.0000 mg | Freq: Two times a day (BID) | INTRAVENOUS | Status: DC
  Administered 2017-02-16 – 2017-02-18 (×5): 40 mg via INTRAVENOUS
  Filled 2017-02-16 (×5): qty 40

## 2017-02-16 MED ORDER — PEG 3350-KCL-NA BICARB-NACL 420 G PO SOLR: 4000.0000 mL | Freq: Once | ORAL | Status: DC

## 2017-02-16 MED ORDER — LEVOTHYROXINE SODIUM 75 MCG PO TABS
75.0000 ug | ORAL_TABLET | Freq: Every day | ORAL | Status: DC
  Administered 2017-02-17 – 2017-02-18 (×2): 75 ug via ORAL
  Filled 2017-02-16 (×2): qty 1

## 2017-02-16 MED ORDER — POTASSIUM CHLORIDE IN NACL 20-0.9 MEQ/L-% IV SOLN
INTRAVENOUS | Status: AC
  Administered 2017-02-16: 07:00:00 via INTRAVENOUS
  Filled 2017-02-16 (×2): qty 1000

## 2017-02-16 MED ORDER — ONDANSETRON HCL 4 MG/2ML IJ SOLN: 4.0000 mg | Freq: Four times a day (QID) | INTRAMUSCULAR | Status: DC | PRN

## 2017-02-16 MED ORDER — PEG 3350-KCL-NA BICARB-NACL 420 G PO SOLR
4000.0000 mL | Freq: Once | ORAL | Status: AC
  Administered 2017-02-16: 4000 mL via ORAL
  Filled 2017-02-16: qty 4000

## 2017-02-16 MED ORDER — INSULIN ASPART 100 UNIT/ML ~~LOC~~ SOLN: 0.0000 [IU] | SUBCUTANEOUS | Status: DC

## 2017-02-16 MED ORDER — ACETAMINOPHEN 650 MG RE SUPP: 650.0000 mg | Freq: Four times a day (QID) | RECTAL | Status: DC | PRN

## 2017-02-16 MED ORDER — ACETAMINOPHEN 325 MG PO TABS: 650.0000 mg | ORAL_TABLET | Freq: Four times a day (QID) | ORAL | Status: DC | PRN

## 2017-02-16 MED ORDER — ONDANSETRON HCL 4 MG PO TABS: 4.0000 mg | ORAL_TABLET | Freq: Four times a day (QID) | ORAL | Status: DC | PRN

## 2017-02-16 NOTE — ED Notes (Signed)
Patient denies pain and is resting comfortably.  

## 2017-02-16 NOTE — H&P (View-Only) (Signed)
Reason for Consult: Rectal bleeding with anemia.  Referring Physician: THP  Rebecca [name] is an 72 y.o. female.  HPI: Mr. Rebecca [name] is a 72 year old black female with multiple medical problems listed below, well known to me from previous colonoscopies done by me in the past. Patient presented to the emergency room with a history of rectal bleeding that started last night when she went to urinate. She states she filled the commode" with blood but denies having any syncope or near syncope weakness or dizziness. On arriving arriving to the ER she was found to be saturating well without any evidence of hypotension or tachycardia hemoglobin dropped from 12.5-10.5 g/dL with a couple of hours. Patient denies having any abdominal pain nausea vomiting diarrhea constipation dysphagia odynophagia. Her last colonoscopy was done in 2014 when she had was noted to have sigmoid diverticulosis and internal hemorrhoids. A colonoscopy was done prior to that in 2006 with a tubular adenoma was removed.  Past Medical History:  Diagnosis Date  . Acute bronchospasm 08/24/2009  . Acute sphenoidal sinusitis 02/26/2010   Qualifier: Diagnosis of  By: Regis Bill MD, Standley Brooking   . CARDIAC MURMUR 12/08/2006  . COLONIC POLYPS, HX OF 12/08/2006  . DIABETES MELLITUS, TYPE II 12/08/2006  . Gallbladder/common duct stone, without infection, with obstruction 03/01/2010   removed ercp  . HYPERLIPIDEMIA 12/08/2006  . HYPERTENSION 12/08/2006  . 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 INFECTION, SKIN AND SOFT TISSUE 07/28/2008  . 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 LIVER FUNCTION TESTS, ABNORMAL 07/28/2008  . Morbid obesity ([place]) 12/08/2006  . OBESITY 09/24/2009  . OBSTRUCTIVE SLEEP APNEA 12/08/2006  . OSTEOARTHRITIS 12/08/2006  . RUQ PAIN 06/16/2008  . SHOULDER PAIN, RIGHT 02/07/2008  . Sleep apnea   . Swelling of limb 07/28/2008  . THYROID FUNCTION TEST, ABNORMAL 12/08/2006   Past Surgical History:  Procedure Laterality Date  . ABDOMINAL HYSTERECTOMY    .  CHOLECYSTECTOMY  06/27/2008  . COLONOSCOPY N/A 12/13/2012   Procedure: COLONOSCOPY;  Surgeon: Juanita Craver, MD;  Location: [facility];  Service: Endoscopy;  Laterality: N/A;  . common duct stone     ERCP     Family History  Problem Relation Age of Onset  . Other Mother        blood clots  . Cervical cancer Mother   . Cancer Mother   . Heart disease Brother   . Diabetes Brother   . Heart disease Sister   . Diabetes Sister   . Heart disease Brother   . Diabetes Brother   . Heart disease Brother   . Diabetes Brother   . Stroke Sister   . Diabetes Sister   . Throat cancer Father   . Cancer Father   . Diabetes Other        all siblings, 4 brothers, 5 sisters   Social History:  reports that  has never smoked. she has never used smokeless tobacco. She reports that she does not drink alcohol or use drugs.  Allergies: No Known Allergies  Medications: I have reviewed the patient's current medications.  Results for orders placed or performed during the hospital encounter of 02/16/17 (from the past 48 hour(s))  Comprehensive metabolic panel     Status: Abnormal   Collection Time: 02/15/17 10:52 PM  Result Value Ref Range   Sodium 137 135 - 145 mmol/L   Potassium 3.3 (L) 3.5 - 5.1 mmol/L   Chloride 102 101 - 111 mmol/L   CO2 22 22 - 32 mmol/L   Glucose, Bld  213 (H) 65 - 99 mg/dL   BUN 17 6 - 20 mg/dL   Creatinine, Ser 1.10 (H) 0.44 - 1.00 mg/dL   Calcium 9.3 8.9 - 10.3 mg/dL   Total Protein 6.9 6.5 - 8.1 g/dL   Albumin 3.5 3.5 - 5.0 g/dL   AST 20 15 - 41 U/L   ALT 16 14 - 54 U/L   Alkaline Phosphatase 86 38 - 126 U/L   Total Bilirubin 0.6 0.3 - 1.2 mg/dL   GFR calc non Af Amer 49 (L) >60 mL/min   GFR calc Af Amer 56 (L) >60 mL/min    Comment: (NOTE) The eGFR has been calculated using the CKD EPI equation. This calculation has not been validated in all clinical situations. eGFR's persistently <60 mL/min signify possible Chronic Kidney Disease.    Anion gap 13 5 - 15   CBC     Status: None   Collection Time: 02/15/17 10:52 PM  Result Value Ref Range   WBC 5.9 4.0 - 10.5 K/uL   RBC 4.18 3.87 - 5.11 MIL/uL   Hemoglobin 12.5 12.0 - 15.0 g/dL   HCT 37.8 36.0 - 46.0 %   MCV 90.4 78.0 - 100.0 fL   MCH 29.9 26.0 - 34.0 pg   MCHC 33.1 30.0 - 36.0 g/dL   RDW 13.9 11.5 - 15.5 %   Platelets 151 150 - 400 K/uL  Type and screen [facility]     Status: None   Collection Time: 02/15/17 10:55 PM  Result Value Ref Range   ABO/RH(D) B NEG    Antibody Screen NEG    Sample Expiration 02/18/2017   ABO/Rh     Status: None   Collection Time: 02/15/17 10:55 PM  Result Value Ref Range   ABO/RH(D) B NEG   I-stat chem 8, ed     Status: Abnormal   Collection Time: 02/16/17  5:09 AM  Result Value Ref Range   Sodium 136 135 - 145 mmol/L   Potassium 3.5 3.5 - 5.1 mmol/L   Chloride 114 (H) 101 - 111 mmol/L   BUN 16 6 - 20 mg/dL   Creatinine, Ser 1.00 0.44 - 1.00 mg/dL   Glucose, Bld 155 (H) 65 - 99 mg/dL   Calcium, Ion 1.11 (L) 1.15 - 1.40 mmol/L   TCO2 29 22 - 32 mmol/L   Hemoglobin 10.5 (L) 12.0 - 15.0 g/dL   HCT 31.0 (L) 36.0 - 46.0 %  POC occult blood, ED     Status: Abnormal   Collection Time: 02/16/17  5:58 AM  Result Value Ref Range   Fecal Occult Bld POSITIVE (A) NEGATIVE  CBG monitoring, ED     Status: Abnormal   Collection Time: 02/16/17  7:47 AM  Result Value Ref Range   Glucose-Capillary 125 (H) 65 - 99 mg/dL  Hemoglobin     Status: Abnormal   Collection Time: 02/16/17 10:11 AM  Result Value Ref Range   Hemoglobin 11.4 (L) 12.0 - 15.0 g/dL  Hematocrit     Status: Abnormal   Collection Time: 02/16/17 10:11 AM  Result Value Ref Range   HCT 35.2 (L) 36.0 - 46.0 %  CBG monitoring, ED     Status: Abnormal   Collection Time: 02/16/17 12:20 PM  Result Value Ref Range   Glucose-Capillary 155 (H) 65 - 99 mg/dL   Review of Systems  Constitutional: Positive for malaise/fatigue. Negative for chills, diaphoresis, fever and weight loss.   HENT: Negative.   Eyes:   Negative.   Respiratory: Negative.   Cardiovascular: Negative.   Gastrointestinal: Positive for blood in stool. Negative for abdominal pain, constipation, diarrhea, heartburn, melena, nausea and vomiting.  Genitourinary: Negative.   Musculoskeletal: Positive for joint pain.  Skin: Negative.   Neurological: Positive for weakness.  Endo/Heme/Allergies: Negative.   Psychiatric/Behavioral: Negative.    Blood pressure (!) 136/52, pulse 73, temperature 98 F (36.7 C), temperature source Oral, resp. rate (!) 25, SpO2 100 %. Physical Exam  Constitutional: She is oriented to person, place, and time. She appears well-developed and well-nourished.  Morbidly obese  HENT:  Head: Atraumatic.  Eyes: Conjunctivae and EOM are normal. Pupils are equal, round, and reactive to light.  Neck: Normal range of motion. Neck supple.  Cardiovascular: Normal rate and regular rhythm.  Respiratory: Effort normal and breath sounds normal.  GI: Soft. Bowel sounds are normal.  Musculoskeletal: Normal range of motion.  Neurological: She is alert and oriented to person, place, and time.  Skin: Skin is warm and dry.  Psychiatric: She has a normal mood and affect. Her behavior is normal. Judgment and thought content normal.   Assessment/Plan: 1) Painless rectal bleeding with anemia-history of diverticulosis and internal hemorrhoids-Will prep patient for repeat colonoscopy tomorrow and make further recommendations as needed. Will allow her clear liquids for now nothing by mouth after 7 AM tomorrow. STOP MOBIC AND ALL OTHER NSAIDS. 2) Personal history of colonic polyps. 3) HTN.  4) Hypothyroidism. 5) AODM/Morbid obesity Heike Pounds 02/16/2017, 4:02 PM

## 2017-02-16 NOTE — H&P (Signed)
History and Physical    Rebecca [name] IPJ:[id number] DOB: 1946-01-14 DOA: 02/16/2017  PCP: Burnis Medin, MD   Patient coming from: Home  Chief Complaint: Painless rectal bleeding   HPI: Rebecca [name] is a 72 y.o. female with medical history significant for now presenting to the emergency department hypertension, hypothyroidism, and diet-controlled diabetes mellitus, for evaluation of painless rectal bleeding.  Patient reports that she was in her usual state of health when she went to the restroom to urinate last night.  She reports of bright red blood "pouring out."  There was no pain associated with this and she has not had any nausea or vomiting.  She denies chest pain or lightheadedness.  Denies any recent illness.  Denies prior experiences with similar symptoms.  Colonoscopy performed 4 years ago revealed mild sigmoid diverticulosis and small internal hemorrhoids.  ED Course: Upon arrival to the ED, patient is found to be afebrile, saturating well on room air, and with vitals otherwise stable.  Chemistry panel was notable for a potassium of 3.3, glucose 213, and creatinine of 1.10, consistent with her apparent baseline.  CBC features a hemoglobin of 12.5, similar to priors.  6 hours later, hemoglobin has dropped to 10.5.  Fecal occult blood testing is positive.  Type and screen was performed.  Gastroenterology was consulted by the ED physician.  Patient remains hemodynamically stable, in no apparent respiratory distress, and will be admitted to the telemetry unit for ongoing evaluation and management of painless rectal bleeding with acute anemia, likely a diverticular bleed.  Review of Systems:  All other systems reviewed and apart from HPI, are negative.  Past Medical History:  Diagnosis Date  . Acute bronchospasm 08/24/2009  . Acute sphenoidal sinusitis 02/26/2010   Qualifier: Diagnosis of  By: Regis Bill MD, Standley Brooking   . CARDIAC MURMUR 12/08/2006  . COLONIC POLYPS, HX OF 12/08/2006  .  DIABETES MELLITUS, TYPE II 12/08/2006  . Gallbladder/common duct stone, without infection, with obstruction 03/01/2010   removed ercp  . HYPERLIPIDEMIA 12/08/2006  . HYPERTENSION 12/08/2006  . 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 INFECTION, SKIN AND SOFT TISSUE 07/28/2008  . 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 LIVER FUNCTION TESTS, ABNORMAL 07/28/2008  . Morbid obesity ([place]) 12/08/2006  . OBESITY 09/24/2009  . OBSTRUCTIVE SLEEP APNEA 12/08/2006  . OSTEOARTHRITIS 12/08/2006  . RUQ PAIN 06/16/2008  . SHOULDER PAIN, RIGHT 02/07/2008  . Sleep apnea   . Swelling of limb 07/28/2008  . THYROID FUNCTION TEST, ABNORMAL 12/08/2006    Past Surgical History:  Procedure Laterality Date  . ABDOMINAL HYSTERECTOMY    . CHOLECYSTECTOMY  06/27/2008  . COLONOSCOPY N/A 12/13/2012   Procedure: COLONOSCOPY;  Surgeon: Juanita Craver, MD;  Location: [facility];  Service: Endoscopy;  Laterality: N/A;  . common duct stone     ERCP      reports that  has never smoked. she has never used smokeless tobacco. She reports that she does not drink alcohol or use drugs.  No Known Allergies  Family History  Problem Relation Age of Onset  . Other Mother        blood clots  . Cervical cancer Mother   . Cancer Mother   . Heart disease Brother   . Diabetes Brother   . Heart disease Sister   . Diabetes Sister   . Heart disease Brother   . Diabetes Brother   . Heart disease Brother   . Diabetes Brother   . Stroke Sister   . Diabetes Sister   .  Throat cancer Father   . Cancer Father   . Diabetes Other        all siblings, 4 brothers, 5 sisters     Prior to Admission medications   Medication Sig Start Date End Date Taking? Authorizing Provider  chlorthalidone (HYGROTON) 25 MG tablet Take 1 tablet (25 mg total) by mouth daily. 10/09/16   Panosh, Standley Brooking, MD  cholecalciferol (VITAMIN D) 1000 UNITS tablet Take 1,000 Units by mouth daily.    [provider]  glucose blood (ACCU-CHEK AVIVA PLUS) test strip Use to check blood  sugar daily E11.9 08/26/16   Panosh, Standley Brooking, MD  Lancets (ACCU-CHEK SOFT TOUCH) lancets Use to test blood glucose twice daily 08/26/16   Panosh, Standley Brooking, MD  levothyroxine (SYNTHROID) 75 MCG tablet Take 1 tablet (75 mcg total) by mouth daily before breakfast. 10/03/16   Panosh, Standley Brooking, MD  losartan (COZAAR) 100 MG tablet Take 1 tablet (100 mg total) by mouth daily. May begin at 50 mg per day and increase to 100 mg 11/04/16   Panosh, Standley Brooking, MD  meloxicam (MOBIC) 15 MG tablet Take 15 mg by mouth daily. 10/28/16   [provider]  Misc Natural Products (OSTEO BI-FLEX ADV JOINT SHIELD PO) Take 1 tablet by mouth daily.    [provider]  Multiple Vitamins-Minerals (CENTRUM SILVER PO) Take 1 tablet by mouth daily.      [provider]  potassium chloride (K-DUR) 10 MEQ tablet TAKE 2 TABLETS BY MOUTH ONCE DAILY 11/18/16   Panosh, Standley Brooking, MD  potassium chloride (K-DUR,KLOR-CON) 10 MEQ tablet take 2 tablets by mouth once daily 06/13/16   Panosh, Standley Brooking, MD    Physical Exam: Vitals:   02/15/17 2250 02/16/17 0357  BP: (!) 181/84 (!) 158/62  Pulse: (!) 105 86  Resp: 18 16  Temp: 98.7 F (37.1 C) 98 F (36.7 C)  TempSrc: Oral Oral  SpO2: 100% 99%      Constitutional: NAD, calm, obese Eyes: PERTLA, lids and conjunctivae normal ENMT: Mucous membranes are moist. Posterior pharynx clear of any exudate or lesions.   Neck: normal, supple, no masses, no thyromegaly Respiratory: clear to auscultation bilaterally, no wheezing, no crackles. Normal respiratory effort.    Cardiovascular: S1 & S2 heard, regular rate and rhythm. No carotid bruits. No significant JVD. Abdomen: No distension, no tenderness, soft. Bowel sounds active.  Musculoskeletal: no clubbing / cyanosis. No joint deformity upper and lower extremities.   Skin: no significant rashes, lesions, ulcers. Warm, dry, well-perfused. Neurologic: CN 2-12 grossly intact. Sensation intact. Strength 5/5 in all 4 limbs.    Psychiatric:  Alert and oriented x 3. Calm, cooperative.     Labs on Admission: I have personally reviewed following labs and imaging studies  CBC: Recent Labs  Lab 02/15/17 2252 02/16/17 0509  WBC 5.9  --   HGB 12.5 10.5*  HCT 37.8 31.0*  MCV 90.4  --   PLT 151  --    Basic Metabolic Panel: Recent Labs  Lab 02/15/17 2252 02/16/17 0509  NA 137 136  K 3.3* 3.5  CL 102 114*  CO2 22  --   GLUCOSE 213* 155*  BUN 17 16  CREATININE 1.10* 1.00  CALCIUM 9.3  --    GFR: CrCl cannot be calculated (Unknown ideal weight.). Liver Function Tests: Recent Labs  Lab 02/15/17 2252  AST 20  ALT 16  ALKPHOS 86  BILITOT 0.6  PROT 6.9  ALBUMIN 3.5   No  results for input(s): LIPASE, AMYLASE in the last 168 hours. No results for input(s): AMMONIA in the last 168 hours. Coagulation Profile: No results for input(s): INR, PROTIME in the last 168 hours. Cardiac Enzymes: No results for input(s): CKTOTAL, CKMB, CKMBINDEX, TROPONINI in the last 168 hours. BNP (last 3 results) No results for input(s): PROBNP in the last 8760 hours. HbA1C: No results for input(s): HGBA1C in the last 72 hours. CBG: No results for input(s): GLUCAP in the last 168 hours. Lipid Profile: No results for input(s): CHOL, HDL, LDLCALC, TRIG, CHOLHDL, LDLDIRECT in the last 72 hours. Thyroid Function Tests: No results for input(s): TSH, T4TOTAL, FREET4, T3FREE, THYROIDAB in the last 72 hours. Anemia Panel: No results for input(s): VITAMINB12, FOLATE, FERRITIN, TIBC, IRON, RETICCTPCT in the last 72 hours. Urine analysis:    Component Value Date/Time   COLORURINE ORANGE BIOCHEMICALS MAY BE AFFECTED BY COLOR (A) 02/19/2010 0830   APPEARANCEUR CLOUDY (A) 02/19/2010 0830   LABSPEC 1.028 02/19/2010 0830   PHURINE 5.0 02/19/2010 0830   GLUCOSEU NEGATIVE 06/16/2008 2006   HGBUR NEGATIVE 02/19/2010 0830   HGBUR negative 06/16/2008 1035   BILIRUBINUR neg 02/20/2011 0903   KETONESUR 15 (A) 02/19/2010 0830    PROTEINUR neg 02/20/2011 0903   PROTEINUR 30 (A) 02/19/2010 0830   UROBILINOGEN 0.2 02/20/2011 0903   UROBILINOGEN 1.0 02/19/2010 0830   NITRITE neg 02/20/2011 0903   NITRITE POSITIVE (A) 02/19/2010 0830   LEUKOCYTESUR Negative 02/20/2011 0903   Sepsis Labs: @LABRCNTIP (procalcitonin:4,lacticidven:4) )No results found for this or any previous visit (from the past 240 hour(s)).   Radiological Exams on Admission: No results found.  EKG: Not performed.   Assessment/Plan  1. Acute GI bleed; anemia  - Presents with bright red blood per rectum; denies abdominal pain, nausea, or vomiting  - Sigmoid diverticulosis noted on prior colonoscopy  - She is not tachycardic or hypotensive  - Initial Hgb 12.5, down to 10.5 six hours later  - Has passed some clots in the ED  - Type and screen has been performed  - Start IVF, keep NPO, consult with GI, follow serial H&H   2. Hypertension  - BP slightly elevated in ED  - Hold losartan and chlorthalidone initially given the acute GIB - Use prn agents only for now    3. Hypothyroidism  - Continue Synthroid   4. Type II DM  - A1c was 6.6% in October 2018  - Currently diet-controlled  - Follow CBG's and use low-intensity SSI with Novolog as needed    5. Hypokalemia  - Serum potassium is 3.3  - KCl added to IVF    DVT prophylaxis: SCD's  Code Status: Full  Family Communication: Discussed with patient Disposition Plan: Admit to telemetry Consults called: Gastroenterology Admission status: Inpatient    Vianne Bulls, MD [facility] Pager [phone]  If 7PM-7AM, please contact night-coverage [email or web address] Password [email or web address]  02/16/2017, 6:18 AM

## 2017-02-16 NOTE — Progress Notes (Addendum)
PROGRESS NOTE    ASHLLEY BOOHER  ZOX:[id number] DOB: 10-24-1945 DOA: 02/16/2017 PCP: Burnis Medin, MD   Brief Narrative: Rebecca [name] is a 72 y.o. female with medical history significant for now presenting to the emergency department hypertension, hypothyroidism, and diet-controlled diabetes mellitus, for evaluation of painless rectal bleeding.  Patient reports that she was in her usual state of health when she went to the restroom to urinate last night.  She reports of bright red blood "pouring out."  There was no pain associated with this and she has not had any nausea or vomiting.  She denies chest pain or lightheadedness.  Denies any recent illness.  Denies prior experiences with similar symptoms.  Colonoscopy performed 4 years ago revealed mild sigmoid diverticulosis and small internal hemorrhoids.  ED Course: Upon arrival to the ED, patient is found to be afebrile, saturating well on room air, and with vitals otherwise stable.  Chemistry panel was notable for a potassium of 3.3, glucose 213, and creatinine of 1.10, consistent with her apparent baseline.  CBC features a hemoglobin of 12.5, similar to priors.  6 hours later, hemoglobin has dropped to 10.5.  Fecal occult blood testing is positive.  Type and screen was performed.  Gastroenterology was consulted by the ED physician.  Patient remains hemodynamically stable, in no apparent respiratory distress, and will be admitted to the telemetry unit for ongoing evaluation and management of painless rectal bleeding with acute anemia, likely a diverticular bleed.     Assessment & Plan:   Principal Problem:   Acute lower GI bleeding Active Problems:   Diabetes mellitus, type 2 (HCC)   OSA on CPAP   Essential hypertension   Hypothyroid  1-Acute blood loss anemia, acute GI bleed.  Presents with rectal bleeding.  Hb drop from 12 to 10/  Continue to cycle hb.  Transfusion as needed.  IV fluids.  GI consulted.  IV protonix but likely  diverticular bleed.   2-HTN; Agree with holding losartan and Chlorthalidone.  PRN hydralazine.   DM type II;  SSI.   Hypokalemia;  IV fluids with KCL>      DVT prophylaxis: SCD Code Status: Full code.  Family Communication:  Disposition Plan: remain in the hospital for care of GI bleed.   Consultants:   GI   Procedures:   none   Antimicrobials: none   Subjective: She report large bloody BM yesterday. Today she has had small one, tea spoon.  She denies melena, abdominal pain.   Objective: Vitals:   02/16/17 0530 02/16/17 0545 02/16/17 0600 02/16/17 0655  BP: (!) 151/45 (!) 153/56 (!) 128/59 (!) 142/78  Pulse: 80 75 87 76  Resp:    18  Temp:      TempSrc:      SpO2: 100% 96% 100% 98%   No intake or output data in the 24 hours ending 02/16/17 0750 There were no vitals filed for this visit.  Examination:  General exam: Appears calm and comfortable  Respiratory system: Clear to auscultation. Respiratory effort normal. Cardiovascular system: S1 & S2 heard, RRR. No JVD, murmurs, rubs, gallops or clicks. No pedal edema. Gastrointestinal system: Abdomen is nondistended, soft and nontender. No organomegaly or masses felt. Normal bowel sounds heard. Central nervous system: Alert and oriented. No focal neurological deficits. Extremities: Symmetric 5 x 5 power. Skin: No rashes, lesions or ulcers    Data Reviewed: I have personally reviewed following labs and imaging studies  CBC: Recent Labs  Lab 02/15/17 2252  02/16/17 0509  WBC 5.9  --   HGB 12.5 10.5*  HCT 37.8 31.0*  MCV 90.4  --   PLT 151  --    Basic Metabolic Panel: Recent Labs  Lab 02/15/17 2252 02/16/17 0509  NA 137 136  K 3.3* 3.5  CL 102 114*  CO2 22  --   GLUCOSE 213* 155*  BUN 17 16  CREATININE 1.10* 1.00  CALCIUM 9.3  --    GFR: CrCl cannot be calculated (Unknown ideal weight.). Liver Function Tests: Recent Labs  Lab 02/15/17 2252  AST 20  ALT 16  ALKPHOS 86  BILITOT  0.6  PROT 6.9  ALBUMIN 3.5   No results for input(s): LIPASE, AMYLASE in the last 168 hours. No results for input(s): AMMONIA in the last 168 hours. Coagulation Profile: No results for input(s): INR, PROTIME in the last 168 hours. Cardiac Enzymes: No results for input(s): CKTOTAL, CKMB, CKMBINDEX, TROPONINI in the last 168 hours. BNP (last 3 results) No results for input(s): PROBNP in the last 8760 hours. HbA1C: No results for input(s): HGBA1C in the last 72 hours. CBG: No results for input(s): GLUCAP in the last 168 hours. Lipid Profile: No results for input(s): CHOL, HDL, LDLCALC, TRIG, CHOLHDL, LDLDIRECT in the last 72 hours. Thyroid Function Tests: No results for input(s): TSH, T4TOTAL, FREET4, T3FREE, THYROIDAB in the last 72 hours. Anemia Panel: No results for input(s): VITAMINB12, FOLATE, FERRITIN, TIBC, IRON, RETICCTPCT in the last 72 hours. Sepsis Labs: No results for input(s): PROCALCITON, LATICACIDVEN in the last 168 hours.  No results found for this or any previous visit (from the past 240 hour(s)).       Radiology Studies: No results found.      Scheduled Meds: . insulin aspart  0-9 Units Subcutaneous Q4H  . [START ON 02/17/2017] levothyroxine  75 mcg Oral QAC breakfast  . sodium chloride flush  3 mL Intravenous Q12H   Continuous Infusions: . 0.9 % NaCl with KCl 20 mEq / L 110 mL/hr at 02/16/17 0716     LOS: 0 days    Time spent: 35 minutes.     Elmarie Shiley, MD [facility] Pager [phone]  If 7PM-7AM, please contact night-coverage [email or web address] Password [email or web address] 02/16/2017, 7:50 AM

## 2017-02-16 NOTE — Consult Note (Signed)
Reason for Consult: Rectal bleeding with anemia.  Referring Physician: THP  Rebecca Rebecca [name] is an 72 y.o. female.  HPI: Rebecca Rebecca [name] is a 72 year old black female with multiple medical problems listed below, well known to me from previous colonoscopies done by me in the past. Patient presented to the emergency room with a history of rectal bleeding that started last night when she went to urinate. She states she filled the commode" with blood but denies having any syncope or near syncope weakness or dizziness. On arriving arriving to the ER she was found to be saturating well without any evidence of hypotension or tachycardia hemoglobin dropped from 12.5-10.5 g/dL with a couple of hours. Patient denies having any abdominal pain nausea vomiting diarrhea constipation dysphagia odynophagia. Her last colonoscopy was done in 2014 when she had was noted to have sigmoid diverticulosis and internal hemorrhoids. A colonoscopy was done prior to that in 2006 with a tubular adenoma was removed.  Past Medical History:  Diagnosis Date  . Acute bronchospasm 08/24/2009  . Acute sphenoidal sinusitis 02/26/2010   Qualifier: Diagnosis of  By: Regis Bill MD, Standley Brooking   . CARDIAC MURMUR 12/08/2006  . COLONIC POLYPS, HX OF 12/08/2006  . DIABETES MELLITUS, TYPE II 12/08/2006  . Gallbladder/common duct stone, without infection, with obstruction 03/01/2010   removed ercp  . HYPERLIPIDEMIA 12/08/2006  . HYPERTENSION 12/08/2006  . 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 INFECTION, SKIN AND SOFT TISSUE 07/28/2008  . 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 LIVER FUNCTION TESTS, ABNORMAL 07/28/2008  . Morbid obesity ([place]) 12/08/2006  . OBESITY 09/24/2009  . OBSTRUCTIVE SLEEP APNEA 12/08/2006  . OSTEOARTHRITIS 12/08/2006  . RUQ PAIN 06/16/2008  . SHOULDER PAIN, RIGHT 02/07/2008  . Sleep apnea   . Swelling of limb 07/28/2008  . THYROID FUNCTION TEST, ABNORMAL 12/08/2006   Past Surgical History:  Procedure Laterality Date  . ABDOMINAL HYSTERECTOMY    .  CHOLECYSTECTOMY  06/27/2008  . COLONOSCOPY N/A 12/13/2012   Procedure: COLONOSCOPY;  Surgeon: Juanita Craver, MD;  Location: [facility];  Service: Endoscopy;  Laterality: N/A;  . common duct stone     ERCP     Family History  Problem Relation Age of Onset  . Other Mother        blood clots  . Cervical cancer Mother   . Cancer Mother   . Heart disease Brother   . Diabetes Brother   . Heart disease Sister   . Diabetes Sister   . Heart disease Brother   . Diabetes Brother   . Heart disease Brother   . Diabetes Brother   . Stroke Sister   . Diabetes Sister   . Throat cancer Father   . Cancer Father   . Diabetes Other        all siblings, 4 brothers, 5 sisters   Social History:  reports that  has never smoked. she has never used smokeless tobacco. She reports that she does not drink alcohol or use drugs.  Allergies: No Known Allergies  Medications: I have reviewed the patient's current medications.  Results for orders placed or performed during the hospital encounter of 02/16/17 (from the past 48 hour(s))  Comprehensive metabolic panel     Status: Abnormal   Collection Time: 02/15/17 10:52 PM  Result Value Ref Range   Sodium 137 135 - 145 mmol/L   Potassium 3.3 (L) 3.5 - 5.1 mmol/L   Chloride 102 101 - 111 mmol/L   CO2 22 22 - 32 mmol/L   Glucose, Bld  213 (H) 65 - 99 mg/dL   BUN 17 6 - 20 mg/dL   Creatinine, Ser 1.10 (H) 0.44 - 1.00 mg/dL   Calcium 9.3 8.9 - 10.3 mg/dL   Total Protein 6.9 6.5 - 8.1 g/dL   Albumin 3.5 3.5 - 5.0 g/dL   AST 20 15 - 41 U/L   ALT 16 14 - 54 U/L   Alkaline Phosphatase 86 38 - 126 U/L   Total Bilirubin 0.6 0.3 - 1.2 mg/dL   GFR calc non Af Amer 49 (L) >60 mL/min   GFR calc Af Amer 56 (L) >60 mL/min    Comment: (NOTE) The eGFR has been calculated using the CKD EPI equation. This calculation has not been validated in all clinical situations. eGFR's persistently <60 mL/min signify possible Chronic Kidney Disease.    Anion gap 13 5 - 15   CBC     Status: None   Collection Time: 02/15/17 10:52 PM  Result Value Ref Range   WBC 5.9 4.0 - 10.5 K/uL   RBC 4.18 3.87 - 5.11 MIL/uL   Hemoglobin 12.5 12.0 - 15.0 g/dL   HCT 37.8 36.0 - 46.0 %   MCV 90.4 78.0 - 100.0 fL   MCH 29.9 26.0 - 34.0 pg   MCHC 33.1 30.0 - 36.0 g/dL   RDW 13.9 11.5 - 15.5 %   Platelets 151 150 - 400 K/uL  Type and screen [place]     Status: None   Collection Time: 02/15/17 10:55 PM  Result Value Ref Range   ABO/RH(D) B NEG    Antibody Screen NEG    Sample Expiration 02/18/2017   ABO/Rh     Status: None   Collection Time: 02/15/17 10:55 PM  Result Value Ref Range   ABO/RH(D) B NEG   I-stat chem 8, ed     Status: Abnormal   Collection Time: 02/16/17  5:09 AM  Result Value Ref Range   Sodium 136 135 - 145 mmol/L   Potassium 3.5 3.5 - 5.1 mmol/L   Chloride 114 (H) 101 - 111 mmol/L   BUN 16 6 - 20 mg/dL   Creatinine, Ser 1.00 0.44 - 1.00 mg/dL   Glucose, Bld 155 (H) 65 - 99 mg/dL   Calcium, Ion 1.11 (L) 1.15 - 1.40 mmol/L   TCO2 29 22 - 32 mmol/L   Hemoglobin 10.5 (L) 12.0 - 15.0 g/dL   HCT 31.0 (L) 36.0 - 46.0 %  POC occult blood, ED     Status: Abnormal   Collection Time: 02/16/17  5:58 AM  Result Value Ref Range   Fecal Occult Bld POSITIVE (A) NEGATIVE  CBG monitoring, ED     Status: Abnormal   Collection Time: 02/16/17  7:47 AM  Result Value Ref Range   Glucose-Capillary 125 (H) 65 - 99 mg/dL  Hemoglobin     Status: Abnormal   Collection Time: 02/16/17 10:11 AM  Result Value Ref Range   Hemoglobin 11.4 (L) 12.0 - 15.0 g/dL  Hematocrit     Status: Abnormal   Collection Time: 02/16/17 10:11 AM  Result Value Ref Range   HCT 35.2 (L) 36.0 - 46.0 %  CBG monitoring, ED     Status: Abnormal   Collection Time: 02/16/17 12:20 PM  Result Value Ref Range   Glucose-Capillary 155 (H) 65 - 99 mg/dL   Review of Systems  Constitutional: Positive for malaise/fatigue. Negative for chills, diaphoresis, fever and weight loss.   HENT: Negative.   Eyes:  Negative.   Respiratory: Negative.   Cardiovascular: Negative.   Gastrointestinal: Positive for blood in stool. Negative for abdominal pain, constipation, diarrhea, heartburn, melena, nausea and vomiting.  Genitourinary: Negative.   Musculoskeletal: Positive for joint pain.  Skin: Negative.   Neurological: Positive for weakness.  Endo/Heme/Allergies: Negative.   Psychiatric/Behavioral: Negative.    Blood pressure (!) 136/52, pulse 73, temperature 98 F (36.7 C), temperature source Oral, resp. rate (!) 25, SpO2 100 %. Physical Exam  Constitutional: She is oriented to person, place, and time. She appears well-developed and well-nourished.  Morbidly obese  HENT:  Head: Atraumatic.  Eyes: Conjunctivae and EOM are normal. Pupils are equal, round, and reactive to light.  Neck: Normal range of motion. Neck supple.  Cardiovascular: Normal rate and regular rhythm.  Respiratory: Effort normal and breath sounds normal.  GI: Soft. Bowel sounds are normal.  Musculoskeletal: Normal range of motion.  Neurological: She is alert and oriented to person, place, and time.  Skin: Skin is warm and dry.  Psychiatric: She has a normal mood and affect. Her behavior is normal. Judgment and thought content normal.   Assessment/Plan: 1) Painless rectal bleeding with anemia-history of diverticulosis and internal hemorrhoids-Will prep patient for repeat colonoscopy tomorrow and make further recommendations as needed. Will allow her clear liquids for now nothing by mouth after 7 AM tomorrow. STOP MOBIC AND ALL OTHER NSAIDS. 2) Personal history of colonic polyps. 3) HTN.  4) Hypothyroidism. 5) AODM/Morbid obesity Yianni Skilling 02/16/2017, 4:02 PM

## 2017-02-16 NOTE — ED Notes (Signed)
Admitting md at bedside

## 2017-02-16 NOTE — ED Provider Notes (Signed)
[place] [facility] EMERGENCY DEPARTMENT Provider Note   CSN: [id number] Arrival date & time: 02/15/17  2235     History   Chief Complaint Chief Complaint  Patient presents with  . Rectal Bleeding    HPI Rebecca [name] is a 72 y.o. female.  HPI 72 year old female comes in with chief complaint of bloody stools. Patient has history of diverticulosis and internal hemorrhoids per her last colonoscopy.  Patient denies any history of bloody stools in the past.  Patient also has history of diabetes, hyperlipidemia -she is not on any anticoagulants or anti-platelet agent, but she does take NSAIDs as needed.  Review of system is negative for abdominal pain, dizziness.   Past Medical History:  Diagnosis Date  . Acute bronchospasm 08/24/2009  . Acute sphenoidal sinusitis 02/26/2010   Qualifier: Diagnosis of  By: Regis Bill MD, Standley Brooking   . CARDIAC MURMUR 12/08/2006  . COLONIC POLYPS, HX OF 12/08/2006  . DIABETES MELLITUS, TYPE II 12/08/2006  . Gallbladder/common duct stone, without infection, with obstruction 03/01/2010   removed ercp  . HYPERLIPIDEMIA 12/08/2006  . HYPERTENSION 12/08/2006  . 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 INFECTION, SKIN AND SOFT TISSUE 07/28/2008  . 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 LIVER FUNCTION TESTS, ABNORMAL 07/28/2008  . Morbid obesity ([place]) 12/08/2006  . OBESITY 09/24/2009  . OBSTRUCTIVE SLEEP APNEA 12/08/2006  . OSTEOARTHRITIS 12/08/2006  . RUQ PAIN 06/16/2008  . SHOULDER PAIN, RIGHT 02/07/2008  . Sleep apnea   . Swelling of limb 07/28/2008  . THYROID FUNCTION TEST, ABNORMAL 12/08/2006    Patient Active Problem List   Diagnosis Date Noted  . Acute lower GI bleeding 02/16/2017  . Diverticulosis of intestine with bleeding   . Rectal bleeding   . Hx of gout 08/21/2015  . Mild aortic stenosis 08/21/2015  . Elevated blood pressure 10/31/2014  . Type 2 diabetes mellitus without complication ([place]) 40/98/1191  . Muscle spasm 03/22/2013  . Skin lesion of back  09/13/2012  . Left shoulder pain 01/20/2012  . Hypothyroid 05/31/2011  . Elevated uric acid in blood 05/31/2011  . Palmar nodule 03/25/2011  . Abnormal urine 02/20/2011  . 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 OBESITY 09/24/2009  . 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 SWELLING OF LIMB 07/28/2008  . LIVER FUNCTION TESTS, ABNORMAL 07/28/2008  . SHOULDER PAIN, RIGHT 02/07/2008  . Diabetes mellitus, type 2 ([place]) 12/08/2006  . HYPERLIPIDEMIA 12/08/2006  . Morbid obesity ([place]) 12/08/2006  . OSA on CPAP 12/08/2006  . Essential hypertension 12/08/2006  . OSTEOARTHRITIS 12/08/2006  . THYROID FUNCTION TEST, ABNORMAL 12/08/2006  . COLONIC POLYPS, HX OF 12/08/2006    Past Surgical History:  Procedure Laterality Date  . ABDOMINAL HYSTERECTOMY    . CHOLECYSTECTOMY  06/27/2008  . COLONOSCOPY N/A 12/13/2012   Procedure: COLONOSCOPY;  Surgeon: Juanita Craver, MD;  Location: [facility];  Service: Endoscopy;  Laterality: N/A;  . common duct stone     ERCP     OB History    No data available       Home Medications    Prior to Admission medications   Medication Sig Start Date End Date Taking? Authorizing Provider  chlorthalidone (HYGROTON) 25 MG tablet Take 1 tablet (25 mg total) by mouth daily. 10/09/16  Yes Panosh, Standley Brooking, MD  cholecalciferol (VITAMIN D) 1000 UNITS tablet Take 1,000 Units by mouth daily.   Yes [provider]  glucose blood (ACCU-CHEK AVIVA PLUS) test strip Use to check blood sugar daily E11.9 08/26/16  Yes Panosh, Standley Brooking, MD  Lancets (ACCU-CHEK SOFT TOUCH) lancets Use to test blood glucose twice daily 08/26/16  Yes Panosh, Standley Brooking, MD  levothyroxine (SYNTHROID) 75 MCG tablet Take 1 tablet (75 mcg total) by mouth daily before breakfast. 10/03/16  Yes Panosh, Standley Brooking, MD  losartan (COZAAR) 100 MG tablet Take 1 tablet (100 mg total) by mouth daily. May begin at 50 mg per day and increase to 100 mg 11/04/16  Yes Panosh, Standley Brooking, MD  meloxicam (MOBIC) 15 MG tablet Take 15 mg by mouth  daily. 10/28/16  Yes [provider]  Misc Natural Products (OSTEO BI-FLEX ADV JOINT SHIELD PO) Take 1 tablet by mouth daily.   Yes [provider]  Multiple Vitamins-Minerals (CENTRUM SILVER PO) Take 1 tablet by mouth daily.     Yes [provider]  potassium chloride (K-DUR,KLOR-CON) 10 MEQ tablet take 2 tablets by mouth once daily 06/13/16  Yes Panosh, Standley Brooking, MD    Family History Family History  Problem Relation Age of Onset  . Other Mother        blood clots  . Cervical cancer Mother   . Cancer Mother   . Heart disease Brother   . Diabetes Brother   . Heart disease Sister   . Diabetes Sister   . Heart disease Brother   . Diabetes Brother   . Heart disease Brother   . Diabetes Brother   . Stroke Sister   . Diabetes Sister   . Throat cancer Father   . Cancer Father   . Diabetes Other        all siblings, 4 brothers, 5 sisters    Social History Social History   Tobacco Use  . Smoking status: Never Smoker  . Smokeless tobacco: Never Used  Substance Use Topics  . Alcohol use: No    Alcohol/week: 0.0 oz  . Drug use: No     Allergies   Patient has no known allergies.   Review of Systems Review of Systems  Constitutional: Positive for activity change.  Gastrointestinal: Positive for blood in stool.  All other systems reviewed and are negative.    Physical Exam Updated Vital Signs BP (!) 151/56   Pulse 74   Temp 98 F (36.7 C) (Oral)   Resp (!) 25   SpO2 94%   Physical Exam  Constitutional: She is oriented to person, place, and time. She appears well-developed.  HENT:  Head: Normocephalic and atraumatic.  Eyes: EOM are normal.  Neck: Normal range of motion. Neck supple.  Cardiovascular: Normal rate.  Pulmonary/Chest: Effort normal.  Abdominal: Bowel sounds are normal. There is no tenderness.  Rectal exam revealed grossly bloody stools with large clots  Neurological: She is alert and oriented to person, place, and time.    Skin: Skin is warm and dry.  Nursing note and vitals reviewed.    ED Treatments / Results  Labs (all labs ordered are listed, but only abnormal results are displayed) Labs Reviewed  COMPREHENSIVE METABOLIC PANEL - Abnormal; Notable for the following components:      Result Value   Potassium 3.3 (*)    Glucose, Bld 213 (*)    Creatinine, Ser 1.10 (*)    GFR calc non Af Amer 49 (*)    GFR calc Af Amer 56 (*)    All other components within normal limits  POC OCCULT BLOOD, ED - Abnormal; Notable for the following components:   Fecal Occult Bld POSITIVE (*)    All other components within normal  limits  I-STAT CHEM 8, ED - Abnormal; Notable for the following components:   Chloride 114 (*)    Glucose, Bld 155 (*)    Calcium, Ion 1.11 (*)    Hemoglobin 10.5 (*)    HCT 31.0 (*)    All other components within normal limits  CBG MONITORING, ED - Abnormal; Notable for the following components:   Glucose-Capillary 125 (*)    All other components within normal limits  CBC  HEMOGLOBIN  HEMOGLOBIN  HEMATOCRIT  HEMATOCRIT  POC OCCULT BLOOD, ED  TYPE AND SCREEN  ABO/RH    EKG  EKG Interpretation None       Radiology No results found.  Procedures Procedures (including critical care time)  Medications Ordered in ED Medications  sodium chloride flush (NS) 0.9 % injection 3 mL (not administered)  0.9 % NaCl with KCl 20 mEq/ L  infusion ( Intravenous New Bag/Given 02/16/17 0716)  acetaminophen (TYLENOL) tablet 650 mg (not administered)    Or  acetaminophen (TYLENOL) suppository 650 mg (not administered)  ondansetron (ZOFRAN) tablet 4 mg (not administered)    Or  ondansetron (ZOFRAN) injection 4 mg (not administered)  insulin aspart (novoLOG) injection 0-9 Units (0 Units Subcutaneous Not Given 02/16/17 0751)  levothyroxine (SYNTHROID, LEVOTHROID) tablet 75 mcg (not administered)  hydrALAZINE (APRESOLINE) injection 10 mg (not administered)  pantoprazole (PROTONIX) injection  40 mg (not administered)  sodium chloride 0.9 % bolus 1,000 mL (0 mLs Intravenous Stopped 02/16/17 0812)     Initial Impression / Assessment and Plan / ED Course  I have reviewed the triage vital signs and the nursing notes.  Pertinent labs & imaging results that were available during my care of the patient were reviewed by me and considered in my medical decision making (see chart for details).     DDx includes:  Diverticular bleed Colon cancer Rectal bleed Internal hemorrhoids External hemorrhoids  Patient is having painless lower GI bleeding.  It appears that patient has mostly passing dark red blood, without stools.  GI bleed is painless, and based on her colonoscopy history likely due to internal hemorrhoids or diverticulosis.  We will admit to medicine. We were unable to get in touch with the GI team, Dr. Collene Mares is the GI doctor.   Final Clinical Impressions(s) / ED Diagnoses   Final diagnoses:  Rectal bleeding  Diverticulosis of intestine with bleeding, unspecified intestinal tract location    ED Discharge Orders    None       Varney Biles, MD 02/16/17 [phone]

## 2017-02-16 NOTE — Progress Notes (Signed)
Pt arrived to [facility]. Alert and oriented x 4, ambulated from stretcher to bed, friend at the bedside. Cardiac monitor placed and CCMD notified, VS stable, pt identified approprietly.  Oriented to room and equipment, and instructed to call for assistance. Call bell with in reach. Pt denied pain, no acute distress noted on assessment. Will continue to monitor and treat pt per MD orders.

## 2017-02-17 ENCOUNTER — Encounter (HOSPITAL_COMMUNITY): Payer: Self-pay

## 2017-02-17 ENCOUNTER — Encounter (HOSPITAL_COMMUNITY)
Admission: EM | Disposition: A | Discharge status: Home / Self Care | Payer: Self-pay | Source: Home / Self Care | Attending: Internal Medicine

## 2017-02-17 HISTORY — PX: COLONOSCOPY: SHX5424

## 2017-02-17 LAB — GLUCOSE, CAPILLARY
GLUCOSE-CAPILLARY: 107 mg/dL — AB (ref 65–99)
GLUCOSE-CAPILLARY: 111 mg/dL — AB (ref 65–99)
GLUCOSE-CAPILLARY: 118 mg/dL — AB (ref 65–99)
GLUCOSE-CAPILLARY: 123 mg/dL — AB (ref 65–99)
Glucose-Capillary: 117 mg/dL — ABNORMAL HIGH (ref 65–99)
Glucose-Capillary: 120 mg/dL — ABNORMAL HIGH (ref 65–99)
Glucose-Capillary: 158 mg/dL — ABNORMAL HIGH (ref 65–99)

## 2017-02-17 LAB — HEMOGLOBIN
HEMOGLOBIN: 13.2 g/dL (ref 12.0–15.0)
HEMOGLOBIN: 11.3 g/dL — AB (ref 12.0–15.0)

## 2017-02-17 LAB — COMPREHENSIVE METABOLIC PANEL
ALBUMIN: 3.6 g/dL (ref 3.5–5.0)
ALT: 17 U/L (ref 14–54)
ANION GAP: 11 (ref 5–15)
AST: 19 U/L (ref 15–41)
Alkaline Phosphatase: 68 U/L (ref 38–126)
BUN: 12 mg/dL (ref 6–20)
CHLORIDE: 107 mmol/L (ref 101–111)
CO2: 22 mmol/L (ref 22–32)
Calcium: 9.4 mg/dL (ref 8.9–10.3)
Creatinine, Ser: 1.04 mg/dL — ABNORMAL HIGH (ref 0.44–1.00)
GFR calc Af Amer: >60 mL/min (ref >60)
GFR calc non Af Amer: 52 mL/min — ABNORMAL LOW (ref >60)
GLUCOSE: 135 mg/dL — AB (ref 65–99)
POTASSIUM: 3.7 mmol/L (ref 3.5–5.1)
SODIUM: 140 mmol/L (ref 135–145)
Total Bilirubin: 0.8 mg/dL (ref 0.3–1.2)
Total Protein: 7.1 g/dL (ref 6.5–8.1)

## 2017-02-17 LAB — HEMATOCRIT
HEMATOCRIT: 38.3 % (ref 36.0–46.0)
HEMATOCRIT: 34 % — AB (ref 36.0–46.0)

## 2017-02-17 SURGERY — COLONOSCOPY WITH PROPOFOL
Anesthesia: Monitor Anesthesia Care

## 2017-02-17 SURGERY — COLONOSCOPY
Anesthesia: Moderate Sedation | Laterality: Left

## 2017-02-17 MED ORDER — FENTANYL CITRATE (PF) 100 MCG/2ML IJ SOLN
INTRAMUSCULAR | Status: AC
  Filled 2017-02-17: qty 2

## 2017-02-17 MED ORDER — MIDAZOLAM HCL 5 MG/5ML IJ SOLN
INTRAMUSCULAR | Status: DC | PRN
  Administered 2017-02-17 (×2): 2 mg via INTRAVENOUS
  Administered 2017-02-17: 1 mg via INTRAVENOUS

## 2017-02-17 MED ORDER — MIDAZOLAM HCL 5 MG/ML IJ SOLN
INTRAMUSCULAR | Status: AC
  Filled 2017-02-17: qty 2

## 2017-02-17 MED ORDER — HYDRALAZINE HCL 20 MG/ML IJ SOLN
INTRAMUSCULAR | Status: AC
  Filled 2017-02-17: qty 1

## 2017-02-17 MED ORDER — FENTANYL CITRATE (PF) 100 MCG/2ML IJ SOLN
INTRAMUSCULAR | Status: DC | PRN
  Administered 2017-02-17 (×2): 25 ug via INTRAVENOUS

## 2017-02-17 NOTE — Interval H&P Note (Signed)
History and Physical Interval Note:  02/17/2017 3:01 PM  Rebecca Rich  has presented today for surgery, with the diagnosis of Rectal bleeding and anemia  The various methods of treatment have been discussed with the patient and family. After consideration of risks, benefits and other options for treatment, the patient has consented to  Procedure(s): COLONOSCOPY (Left) as a surgical intervention .  The patient's history has been reviewed, patient examined, no change in status, stable for surgery.  I have reviewed the patient's chart and labs.  Questions were answered to the patient's satisfaction.     Destin Vinsant D

## 2017-02-17 NOTE — Progress Notes (Signed)
PROGRESS NOTE    [name]  XVQ:[id number] DOB: March 04, 1945 DOA: 02/16/2017 PCP: Burnis Medin, MD   Brief Narrative: Rebecca Rich is a 72 y.o. female with medical history significant for now presenting to the emergency department hypertension, hypothyroidism, and diet-controlled diabetes mellitus, for evaluation of painless rectal bleeding.  Patient reports that she was in her usual state of health when she went to the restroom to urinate last night.  She reports of bright red blood "pouring out."  There was no pain associated with this and she has not had any nausea or vomiting.  She denies chest pain or lightheadedness.  Denies any recent illness.  Denies prior experiences with similar symptoms.  Colonoscopy performed 4 years ago revealed mild sigmoid diverticulosis and small internal hemorrhoids.  ED Course: Upon arrival to the ED, patient is found to be afebrile, saturating well on room air, and with vitals otherwise stable.  Chemistry panel was notable for a potassium of 3.3, glucose 213, and creatinine of 1.10, consistent with her apparent baseline.  CBC features a hemoglobin of 12.5, similar to priors.  6 hours later, hemoglobin has dropped to 10.5.  Fecal occult blood testing is positive.  Type and screen was performed.  Gastroenterology was consulted by the ED physician.  Patient remains hemodynamically stable, in no apparent respiratory distress, and will be admitted to the telemetry unit for ongoing evaluation and management of painless rectal bleeding with acute anemia, likely a diverticular bleed.     Assessment & Plan:   Principal Problem:   Acute lower GI bleeding Active Problems:   Diabetes mellitus, type 2 (HCC)   OSA on CPAP   Essential hypertension   Hypothyroid  1-Acute blood loss Anemia, Acute GI bleed.  Presents with rectal bleeding.  Hb drop from 12 to 10/  Continue to cycle hb.  Transfusion as needed.  IV fluids.  GI consulted.  IV protonix but likely  diverticular bleed.  For colonoscopy today, which showed 2 mm polyp which was removed.  Plan to resume diet and discharge 1-23 if hb stable.   2-HTN; Agree with holding losartan and Chlorthalidone.  PRN hydralazine.   DM type II;  SSI.   Hypokalemia;  IV fluids with KCL>      DVT prophylaxis: SCD Code Status: Full code.  Family Communication:  Disposition Plan: remain in the hospital for care of GI bleed.   Consultants:   GI   Procedures:   none   Antimicrobials: none   Subjective: She is sleepy, she was not able to sleep last night.  Denies abdominal pain, no further rectal bleeding.   Objective: Vitals:   02/17/17 1537 02/17/17 1538 02/17/17 1545 02/17/17 1557  BP: (!) 135/58 (!) 135/58 (!) 132/41 (!) 151/62  Pulse: 74 75 73 72  Resp: (!) 28 (!) 21 (!) 27 (!) 26  Temp:   98.4 F (36.9 C)   TempSrc:   Oral   SpO2: 100% 100% 95% 96%  Weight:      Height:        Intake/Output Summary (Last 24 hours) at 02/17/2017 1624 Last data filed at 02/17/2017 0300 Gross per 24 hour  Intake 1364.5 ml  Output -  Net 1364.5 ml   Filed Weights   02/16/17 2100 02/17/17 0605  Weight: 133.1 kg (293 lb 6.9 oz) 133.4 kg (294 lb 1.5 oz)    Examination:  General exam: NAD Respiratory system: CTA Cardiovascular system:  S 1, S 2 RRR Gastrointestinal system:  BS present, soft, nt Central nervous system: non focal.  Extremities: symmetric power  Skin: No rashes, lesions or ulcers    Data Reviewed: I have personally reviewed following labs and imaging studies  CBC: Recent Labs  Lab 02/15/17 2252 02/16/17 0509 02/16/17 1011 02/16/17 1819 02/17/17 0328 02/17/17 0938  WBC 5.9  --   --   --   --   --   HGB 12.5 10.5* 11.4* 11.3* 13.2 11.3*  HCT 37.8 31.0* 35.2* 34.5* 38.3 34.0*  MCV 90.4  --   --   --   --   --   PLT 151  --   --   --   --   --    Basic Metabolic Panel: Recent Labs  Lab 02/15/17 2252 02/16/17 0509 02/17/17 0328  NA 137 136 140  K 3.3*  3.5 3.7  CL 102 114* 107  CO2 22  --  22  GLUCOSE 213* 155* 135*  BUN 17 16 12   CREATININE 1.10* 1.00 1.04*  CALCIUM 9.3  --  9.4   GFR: Estimated Creatinine Clearance: 62.4 mL/min (A) (by C-G formula based on SCr of 1.04 mg/dL (H)). Liver Function Tests: Recent Labs  Lab 02/15/17 2252 02/17/17 0328  AST 20 19  ALT 16 17  ALKPHOS 86 68  BILITOT 0.6 0.8  PROT 6.9 7.1  ALBUMIN 3.5 3.6   No results for input(s): LIPASE, AMYLASE in the last 168 hours. No results for input(s): AMMONIA in the last 168 hours. Coagulation Profile: No results for input(s): INR, PROTIME in the last 168 hours. Cardiac Enzymes: No results for input(s): CKTOTAL, CKMB, CKMBINDEX, TROPONINI in the last 168 hours. BNP (last 3 results) No results for input(s): PROBNP in the last 8760 hours. HbA1C: No results for input(s): HGBA1C in the last 72 hours. CBG: Recent Labs  Lab 02/17/17 0058 02/17/17 0438 02/17/17 0908 02/17/17 1202 02/17/17 1618  GLUCAP 107* 111* 118* 120* 123*   Lipid Profile: No results for input(s): CHOL, HDL, LDLCALC, TRIG, CHOLHDL, LDLDIRECT in the last 72 hours. Thyroid Function Tests: No results for input(s): TSH, T4TOTAL, FREET4, T3FREE, THYROIDAB in the last 72 hours. Anemia Panel: No results for input(s): VITAMINB12, FOLATE, FERRITIN, TIBC, IRON, RETICCTPCT in the last 72 hours. Sepsis Labs: No results for input(s): PROCALCITON, LATICACIDVEN in the last 168 hours.  No results found for this or any previous visit (from the past 240 hour(s)).       Radiology Studies: No results found.      Scheduled Meds: . insulin aspart  0-9 Units Subcutaneous Q4H  . levothyroxine  75 mcg Oral QAC breakfast  . pantoprazole (PROTONIX) IV  40 mg Intravenous Q12H  . sodium chloride flush  3 mL Intravenous Q12H   Continuous Infusions:    LOS: 1 day    Time spent: 35 minutes.     Elmarie Shiley, MD [facility] Pager [phone]  If 7PM-7AM, please contact  night-coverage [email or web address] Password [facility] 02/17/2017, 4:24 PM

## 2017-02-17 NOTE — Progress Notes (Signed)
RT went to setup new CPAP for patient.  CPAP not setup tonight due to the fact the patient is doing a bowel prep.  CPAP left  In room incase pt decides she wants to wear it.

## 2017-02-17 NOTE — Op Note (Signed)
[facility] Patient Name: Rebecca Rich Procedure Date : 02/17/2017 MRN: [id number] Attending MD: Carol Ada , MD Date of Birth: 26-Dec-1945 CSN: [id number] Age: 72 Admit Type: Inpatient Procedure:                Colonoscopy Indications:              Hematochezia Providers:                Carol Ada, MD, Kingsley Plan, RN, Alan Mulder, Technician Referring MD:              Medicines:                Fentanyl 50 micrograms IV, Midazolam 5 mg IV Complications:            No immediate complications. Estimated Blood Loss:     Estimated blood loss: none. Procedure:                Pre-Anesthesia Assessment:                           - Prior to the procedure, a History and Physical                            was performed, and patient medications and                            allergies were reviewed. The patient's tolerance of                            previous anesthesia was also reviewed. The risks                            and benefits of the procedure and the sedation                            options and risks were discussed with the patient.                            All questions were answered, and informed consent                            was obtained. Prior Anticoagulants: The patient has                            taken no previous anticoagulant or antiplatelet                            agents. ASA Grade Assessment: III - A patient with                            severe systemic disease. After reviewing the risks  and benefits, the patient was deemed in                            satisfactory condition to undergo the procedure.                           - Sedation was administered by an anesthesia                            professional. The sedation level attained was                            moderate.                           After obtaining informed consent, the colonoscope        was passed under direct vision. Throughout the                            procedure, the patient's blood pressure, pulse, and                            oxygen saturations were monitored continuously. The                            EC-[id number] ([id number]) scope was introduced through                            the anus and advanced to the the cecum, identified                            by appendiceal orifice and ileocecal valve. The                            colonoscopy was performed without difficulty. The                            patient tolerated the procedure well. The quality                            of the bowel preparation was good. The ileocecal                            valve, appendiceal orifice, and rectum were                            photographed. Scope In: 3:13:59 PM Scope Out: 3:37:27 PM Scope Withdrawal Time: 0 hours 17 minutes 20 seconds  Total Procedure Duration: 0 hours 23 minutes 28 seconds  Findings:      A 2 mm polyp was found in the ascending colon. The polyp was sessile.       The polyp was removed with a cold snare. Resection and retrieval were       complete.      There was no evidence of any blood with this  procedure. 1.5 liters of       lavage was used to clean off the mucosa as there was some adherent       stool. Several passes were made in the sigmoid colon, but no diverticula       were identified. It is likely that her diverticulosis was minimal. Impression:               - One 2 mm polyp in the ascending colon, removed                            with a cold snare. Resected and retrieved. Moderate Sedation:      Moderate (conscious) sedation was administered by the endoscopy nurse       and supervised by the endoscopist. The following parameters were       monitored: oxygen saturation, heart rate, blood pressure, and response       to care. Recommendation:           - Return patient to hospital ward for ongoing care.                            - Resume regular diet.                           - Continue present medications.                           - Await pathology results.                           - Repeat colonoscopy is not recommended for                            surveillance with the current findings of the very                            small polyp, her age, and comorbidities.                           - If HGB is stagle she can be discharged home                            tomorrow.                           - Follow up with Dr. Collene Mares in 4 weeks. Procedure Code(s):        --- Professional ---                           [phone], Colonoscopy, flexible; with removal of                            tumor(s), polyp(s), or other lesion(s) by snare                            technique Diagnosis Code(s):        --- Professional ---  D12.2, Benign neoplasm of ascending colon                           K92.1, Melena (includes Hematochezia) CPT copyright 2016 American Medical Association. All rights reserved. The codes documented in this report are preliminary and upon coder review may  be revised to meet current compliance requirements. Carol Ada, MD Carol Ada, MD 02/17/2017 3:52:58 PM This report has been signed electronically. Number of Addenda: 0

## 2017-02-18 ENCOUNTER — Encounter (HOSPITAL_COMMUNITY): Payer: Self-pay | Admitting: Gastroenterology

## 2017-02-18 ENCOUNTER — Other Ambulatory Visit: Payer: Self-pay | Admitting: Family Medicine

## 2017-02-18 ENCOUNTER — Telehealth: Payer: Self-pay | Admitting: Internal Medicine

## 2017-02-18 DIAGNOSIS — K625 Hemorrhage of anus and rectum: Secondary | ICD-10-CM

## 2017-02-18 DIAGNOSIS — K922 Gastrointestinal hemorrhage, unspecified: Principal | ICD-10-CM

## 2017-02-18 DIAGNOSIS — I10 Essential (primary) hypertension: Secondary | ICD-10-CM

## 2017-02-18 DIAGNOSIS — E039 Hypothyroidism, unspecified: Secondary | ICD-10-CM

## 2017-02-18 DIAGNOSIS — E119 Type 2 diabetes mellitus without complications: Secondary | ICD-10-CM

## 2017-02-18 LAB — BASIC METABOLIC PANEL
Anion gap: 10 (ref 5–15)
BUN: 11 mg/dL (ref 6–20)
CALCIUM: 8.7 mg/dL — AB (ref 8.9–10.3)
CO2: 24 mmol/L (ref 22–32)
Chloride: 107 mmol/L (ref 101–111)
Creatinine, Ser: 1.09 mg/dL — ABNORMAL HIGH (ref 0.44–1.00)
GFR calc Af Amer: 57 mL/min — ABNORMAL LOW (ref >60)
GFR, EST NON AFRICAN AMERICAN: 49 mL/min — AB (ref >60)
GLUCOSE: 123 mg/dL — AB (ref 65–99)
Potassium: 3.2 mmol/L — ABNORMAL LOW (ref 3.5–5.1)
Sodium: 141 mmol/L (ref 135–145)

## 2017-02-18 LAB — CBC
HEMATOCRIT: 33.9 % — AB (ref 36.0–46.0)
Hemoglobin: 10.9 g/dL — ABNORMAL LOW (ref 12.0–15.0)
MCH: 29.2 pg (ref 26.0–34.0)
MCHC: 32.2 g/dL (ref 30.0–36.0)
MCV: 90.9 fL (ref 78.0–100.0)
PLATELETS: 137 10*3/uL — AB (ref 150–400)
RBC: 3.73 MIL/uL — ABNORMAL LOW (ref 3.87–5.11)
RDW: 14.1 % (ref 11.5–15.5)
WBC: 5 10*3/uL (ref 4.0–10.5)

## 2017-02-18 LAB — GLUCOSE, CAPILLARY
GLUCOSE-CAPILLARY: 126 mg/dL — AB (ref 65–99)
GLUCOSE-CAPILLARY: 135 mg/dL — AB (ref 65–99)
Glucose-Capillary: 125 mg/dL — ABNORMAL HIGH (ref 65–99)

## 2017-02-18 MED ORDER — POTASSIUM CHLORIDE CRYS ER 20 MEQ PO TBCR
40.0000 meq | EXTENDED_RELEASE_TABLET | Freq: Once | ORAL | Status: AC
  Administered 2017-02-18: 40 meq via ORAL
  Filled 2017-02-18: qty 2

## 2017-02-18 MED ORDER — POTASSIUM CHLORIDE CRYS ER 10 MEQ PO TBCR: 20.0000 meq | EXTENDED_RELEASE_TABLET | Freq: Every day | ORAL | 0 refills | Status: DC

## 2017-02-18 NOTE — Telephone Encounter (Signed)
Copied from [place] [id number]. Topic: Appointment Scheduling - Scheduling Inquiry for Clinic >> Feb 18, 2017 10:13 AM Lolita Rieger, RMA wrote: Reason for CRM: Pt needs an appt for a hospital follow up scheduled within the next 2 weeks and I do not see an open slot available Will you please check schedule and make appointment Pt will be released from hospital today @ one and can be reached@ [phone]

## 2017-02-18 NOTE — Telephone Encounter (Signed)
I spoke with pt and did get her on schedule for March 03, 2017 Tuesday at 3:45 pm for hospital follow up. Pt was advised to continue with Potassium and gave her a dose before leaving the hospiltal, told her the level was low. Pt requesting a script for Potassium [facility] [address]. She was advised to schedule a follow up with GI in one month.

## 2017-02-18 NOTE — Plan of Care (Signed)
Pt is a/o x4. Sr, VSS. Ra. Bilateral breath sounds clear. Bowel sounds present. No Bm. Voiding. Independently ambulatory. Falls precautions maintained. Able to turn self. Refuses scds for vte. Plan to d/c home today. Will continue to monitor.

## 2017-02-18 NOTE — Telephone Encounter (Signed)
I will check with Dr. Regis Bill first to find out where we can schedule patient.

## 2017-02-18 NOTE — Telephone Encounter (Signed)
Requesting refill of Potassium  LOV 11/04/16 with Dr. Regis Bill  Last Bmet 02/17/17  with K+ 3.2

## 2017-02-18 NOTE — Telephone Encounter (Signed)
[facility] [address], had to resend script and I spoke with pt.

## 2017-02-18 NOTE — Telephone Encounter (Signed)
Copied from [place]. Topic: Quick Communication - Rx Refill/Question >> Feb 18, 2017 10:07 AM Lolita Rieger, RMA wrote: Medication: potassium 10 meq   Has the patient contacted their pharmacy? yes   (Agent: If no, request that the patient contact the pharmacy for the refill.)   Preferred Pharmacy (with phone number or street name): [facility] [address]   Agent: Please be advised that RX refills may take up to 3 business days. We ask that you follow-up with your pharmacy.

## 2017-02-18 NOTE — Consult Note (Signed)
         [facility] CM Primary Care Navigator  02/18/2017  Rebecca Rich [birth year] [id number]   Seen patient at the bedsideto identify possible discharge needs. Patient reportshaving "rectal bleeding" (status post colonoscopy) that resulted to this admission. Patient endorsesDr. Shanon Ace with [facility] as the primary care provider.   [facility] [address].   Patientstatesthat she ismanaging her own medications at home with use of "pill box" system filled once a week.  Patient reports that she has been driving prior to admission. Her sister Rebecca Rich) or nephew Rebecca Rich) will be able to provide transportation to her doctors'appointments if needed after discharge.  Patientmentioned living alone and independent with care. She states that she is the caregiver for herself. Her sister (lives close) will be there to assist her when needed.    Anticipated discharge plan ishome per patient.  Patientvoiced understanding to call primary care provider's office when she returns home, for a post discharge follow-up appointment within 1-2 weeks or sooner if needed.Patient letter (with PCP's contact number) was provided asareminder. Patient had actually scheduled a follow-up appointment with primary care provider for 02/25/17.  Explained to patient about [facility] CM services available for health managementat home but she denies needing any services at this time. She mentioned managing diabetes well with diet restrictions (diet-controlled), regular exercise, monitoring blood sugars and close follow-up with provider when needed. Recent A1c is 6.6 and not on any DM medication per patient.  Patientvoiced understandingof needto seek referral from primary care provider to [facility] care management whennecessaryand deemed appropriate for services in thefuture.  [facility] care management information  provided for future needs thatshe may have.  Patienthowever, verbally agreed and opted forEMMIcalls tofollowupwith recovery at home.  Referral made for [facility] General calls after discharge.   For questions, please contact:  Dannielle Huh, BSN, RN- [facility] Primary Care Navigator  Telephone: [phone] [place]

## 2017-02-18 NOTE — Telephone Encounter (Signed)
Per chart pt has not had this rx refilled since 11/18/16 #60 with no refills. Pt is currently admitted at [facility].   Dr. Regis Bill - Please advise. Thanks!

## 2017-02-18 NOTE — Telephone Encounter (Signed)
Will [facility] in [facility]  [facility] [address] but  I am not sure it was the right one so check with the patient.    Lab Results  Component Value Date   WBC 5.0 02/18/2017   HGB 10.9 (L) 02/18/2017   HCT 33.9 (L) 02/18/2017   PLT 137 (L) 02/18/2017   GLUCOSE 123 (H) 02/18/2017   CHOL 133 07/22/2016   TRIG 118.0 07/22/2016   HDL 47.90 07/22/2016   LDLCALC 61 07/22/2016   ALT 17 02/17/2017   AST 19 02/17/2017   NA 141 02/18/2017   K 3.2 (L) 02/18/2017   CL 107 02/18/2017   CREATININE 1.09 (H) 02/18/2017   BUN 11 02/18/2017   CO2 24 02/18/2017   TSH 2.93 07/22/2016   HGBA1C 6.6 11/04/2016   MICROALBUR 0.4 08/02/2009

## 2017-02-18 NOTE — Telephone Encounter (Signed)
Decline at this time   She is on hosp for  GI bleed  If she  Is on it after discharge we can   rx at that time.

## 2017-02-18 NOTE — Progress Notes (Signed)
Discharge instructions, RX's and follow up appt explained and provided to patient verbalized understanding. Patient left floor via wheelchair accompanied by volunteers. No c/o pain or shortness of breath at d/c.  Rin Gorton, Tivis Ringer, RN

## 2017-02-18 NOTE — Discharge Summary (Signed)
Discharge Summary  Rebecca Rich HYW:[id number] DOB: 09-Jan-1946  PCP: Burnis Medin, MD  Admit date: 02/16/2017 Discharge date: 02/18/2017  Time spent: > 30 mins  Recommendations for Outpatient Follow-up:  1. PCP in 1 week to monitor hemoglobin  2. GI in 4 weeks   Discharge Diagnoses:  Active Hospital Problems   Diagnosis Date Noted  . Acute lower GI bleeding 02/16/2017  . Hypothyroid 05/31/2011  . Diabetes mellitus, type 2 ([place]) 12/08/2006  . Essential hypertension 12/08/2006  . OSA on CPAP 12/08/2006    Resolved Hospital Problems  No resolved problems to display.    Discharge Condition: Stable   Diet recommendation: Heart health, mod carb  Vitals:   02/18/17 0601 02/18/17 0809  BP: (!) 140/47 (!) 178/56  Pulse: 76 73  Resp: (!) 24 (!) 22  Temp: 99 F (37.2 C) 98.9 F (37.2 C)  SpO2: 99% 99%    History of present illness:  [name] a 72 y.o.femalewith medical history significant fornow presenting to the emergency department hypertension, hypothyroidism, and diet-controlled diabetes mellitus, for evaluation of painless rectal bleeding. Patient reports that she was in her usual state of health when she went to the restroom, noted bright red blood "pouring out." There was no pain associated with this and she has not had any nausea or vomiting. She denies chest pain or lightheadedness. Denies any recent illness. Denies prior experiences with similar symptoms. Colonoscopy performed 4 years ago revealed mild sigmoid diverticulosis and small internal hemorrhoids. Upon arrival to the ED, CBC showed hemoglobin of 12.5, similar to priors. 6 hours later, hemoglobin has dropped to 10.5. Fecal occult blood testing was positive. Gastroenterology was consulted and colonoscopy was performed  Today, pt reported feeling much better, no further rectal bleeds noted. Denies any chest pain, SOB, abdominal pain, dizziness. Pt stable to be discharged  Hospital Course:    Principal Problem:   Acute lower GI bleeding Active Problems:   Diabetes mellitus, type 2 (HCC)   OSA on CPAP   Essential hypertension   Hypothyroid  Acute Lower GI bleed.  Resolved Presents with rectal bleeding, no bleeding in >24Hrs Hb drop from 12 to 10, currently stable GI consulted: colonoscopy done showed one 40mm polyp in the sigmoid colon which was resected, but no diverticula was seen, likely minimal diverticulosis. No bleeding noted Follow up with GI in 4 weeks  HTN; Continue losartan and Chlorthalidone  DM type II;  Diet controlled  Hypokalemia;  PO K+ replacement prn    Procedures:  Colonoscopy  Consultations:  GI  Discharge Exam: BP (!) 178/56 (BP Location: Left Arm)   Pulse 73   Temp 98.9 F (37.2 C) (Oral)   Resp (!) 22   Ht 5\' 1"  (1.549 m)   Wt 132.6 kg (292 lb 5.3 oz)   SpO2 99%   BMI 55.24 kg/m   General: Alert, awake, oriented, NAD Cardiovascular: S1,S2 present, no added hrt sound Respiratory: Chest clear bilaterally  Discharge Instructions You were cared for by a hospitalist during your hospital stay. If you have any questions about your discharge medications or the care you received while you were in the hospital after you are discharged, you can call the unit and asked to speak with the hospitalist on call if the hospitalist that took care of you is not available. Once you are discharged, your primary care physician will handle any further medical issues. Please note that NO REFILLS for any discharge medications will be authorized once you are  discharged, as it is imperative that you return to your primary care physician (or establish a relationship with a primary care physician if you do not have one) for your aftercare needs so that they can reassess your need for medications and monitor your lab values.  Discharge Instructions    Diet - low sodium heart healthy   Complete by:  As directed    Increase activity slowly   Complete by:   As directed      Allergies as of 02/18/2017   No Known Allergies     Medication List    STOP taking these medications   meloxicam 15 MG tablet Commonly known as:  MOBIC     TAKE these medications   accu-chek soft touch lancets Use to test blood glucose twice daily   CENTRUM SILVER PO Take 1 tablet by mouth daily.   chlorthalidone 25 MG tablet Commonly known as:  HYGROTON Take 1 tablet (25 mg total) by mouth daily.   cholecalciferol 1000 units tablet Commonly known as:  VITAMIN D Take 1,000 Units by mouth daily.   glucose blood test strip Commonly known as:  ACCU-CHEK AVIVA PLUS Use to check blood sugar daily E11.9   levothyroxine 75 MCG tablet Commonly known as:  SYNTHROID Take 1 tablet (75 mcg total) by mouth daily before breakfast.   losartan 100 MG tablet Commonly known as:  COZAAR Take 1 tablet (100 mg total) by mouth daily. May begin at 50 mg per day and increase to 100 mg   OSTEO BI-FLEX ADV JOINT SHIELD PO Take 1 tablet by mouth daily.   potassium chloride 10 MEQ tablet Commonly known as:  K-DUR,KLOR-CON take 2 tablets by mouth once daily      No Known Allergies Follow-up Information    Panosh, Standley Brooking, MD. Schedule an appointment as soon as possible for a visit in 1 week(s).   Specialties:  Internal Medicine, Pediatrics Contact information: [place] [address] [phone]        Juanita Craver, MD. Schedule an appointment as soon as possible for a visit in 4 week(s).   Specialty:  Gastroenterology Contact information: [address] [place] [address] [phone]            The results of significant diagnostics from this hospitalization (including imaging, microbiology, ancillary and laboratory) are listed below for reference.    Significant Diagnostic Studies: No results found.  Microbiology: No results found for this or any previous visit (from the past 240 hour(s)).   Labs: Basic  Metabolic Panel: Recent Labs  Lab 02/15/17 2252 02/16/17 0509 02/17/17 0328 02/18/17 0336  NA 137 136 140 141  K 3.3* 3.5 3.7 3.2*  CL 102 114* 107 107  CO2 22  --  22 24  GLUCOSE 213* 155* 135* 123*  BUN 17 16 12 11   CREATININE 1.10* 1.00 1.04* 1.09*  CALCIUM 9.3  --  9.4 8.7*   Liver Function Tests: Recent Labs  Lab 02/15/17 2252 02/17/17 0328  AST 20 19  ALT 16 17  ALKPHOS 86 68  BILITOT 0.6 0.8  PROT 6.9 7.1  ALBUMIN 3.5 3.6   No results for input(s): LIPASE, AMYLASE in the last 168 hours. No results for input(s): AMMONIA in the last 168 hours. CBC: Recent Labs  Lab 02/15/17 2252  02/16/17 1011 02/16/17 1819 02/17/17 0328 02/17/17 0938 02/18/17 0336  WBC 5.9  --   --   --   --   --  5.0  HGB 12.5   < > 11.4* 11.3* 13.2 11.3* 10.9*  HCT 37.8   < > 35.2* 34.5* 38.3 34.0* 33.9*  MCV 90.4  --   --   --   --   --  90.9  PLT 151  --   --   --   --   --  137*   < > = values in this interval not displayed.   Cardiac Enzymes: No results for input(s): CKTOTAL, CKMB, CKMBINDEX, TROPONINI in the last 168 hours. BNP: BNP (last 3 results) No results for input(s): BNP in the last 8760 hours.  ProBNP (last 3 results) No results for input(s): PROBNP in the last 8760 hours.  CBG: Recent Labs  Lab 02/17/17 1202 02/17/17 1618 02/17/17 2032 02/17/17 2359 02/18/17 0408  GLUCAP 120* 123* 158* 135* 126*       Signed:  Alma Friendly, MD [facility] 02/18/2017, 11:51 AM

## 2017-02-18 NOTE — Progress Notes (Signed)
MD Ez[place]uka notified BP 178/76. No scheduled meds due & PRN Hydralazine ordered for >180.   MD would like to proceed with PRN Hydralazine. Will plan to reorder home HTN meds following rounding and assessment.

## 2017-02-18 NOTE — Telephone Encounter (Signed)
Copied from [place]. Topic: Quick Communication - Rx Refill/Question >> Feb 18, 2017 10:07 AM Lolita Rieger, RMA wrote: Medication: potassium 10 meq   Has the patient contacted their pharmacy? yes   (Agent: If no, request that the patient contact the pharmacy for the refill.)   Preferred Pharmacy (with phone number or street name): [facility] on [address]   Agent: Please be advised that RX refills may take up to 3 business days. We ask that you follow-up with your pharmacy.

## 2017-02-18 NOTE — Telephone Encounter (Signed)
Sunday Spillers  I sent it in to the one near [address] on [address]

## 2017-02-19 ENCOUNTER — Telehealth: Payer: Self-pay | Admitting: *Deleted

## 2017-02-19 NOTE — Telephone Encounter (Signed)
Pt returned call. Please call her on Friday.

## 2017-02-19 NOTE — Telephone Encounter (Signed)
Unable to reach patient at time of TCM Call. Left message for patient to return call when available.  

## 2017-02-20 NOTE — Telephone Encounter (Signed)
Transition Care Management Follow-up Telephone Call  Per Discharge Summary: Admit date: 02/16/2017 Discharge date: 02/18/2017   Recommendations for Outpatient Follow-up:  1. PCP in 1 week to monitor hemoglobin  2. GI in 4 weeks   Discharge Diagnoses:      Active Hospital Problems   Diagnosis Date Noted  . Acute lower GI bleeding 02/16/2017  . Hypothyroid 05/31/2011  . Diabetes mellitus, type 2 ([place]) 12/08/2006  . Essential hypertension 12/08/2006  . OSA on CPAP 12/08/2006    Resolved Hospital Problems  No resolved problems to display.    Discharge Condition: Stable   Diet recommendation: Heart health, mod carb  --   How have you been since you were released from the hospital? "Very good."   Do you understand why you were in the hospital? yes   Do you understand the discharge instructions? yes   Where were you discharged to? Home   Items Reviewed:  Medications reviewed: yes  Allergies reviewed: yes  Dietary changes reviewed: yes  Referrals reviewed: yes   Functional Questionnaire:   Activities of Daily Living (ADLs):   She states they are independent in the following: ambulation, bathing and hygiene, feeding, continence, grooming, toileting and dressing States they require assistance with the following: none   Any transportation issues/concerns?: no   Any patient concerns? no   Confirmed importance and date/time of follow-up visits scheduled yes  Provider Appointment booked with Dr. Shanon Ace 03/03/17 @ 3:45pm  Confirmed with patient if condition begins to worsen call PCP or go to the ER.  Patient was given the office number and encouraged to call back with question or concerns.  : yes

## 2017-02-20 NOTE — Telephone Encounter (Signed)
Medication filled to pharmacy as requested on 02/18/17.

## 2017-02-24 ENCOUNTER — Encounter: Payer: Self-pay | Admitting: Internal Medicine

## 2017-03-02 NOTE — Progress Notes (Signed)
Chief Complaint  Patient presents with  . Hospitalization Follow-up    seen in hospital 02/16/17 for rectal bleeding. Pt had colonoscopy and bleeding was treated, Pt wants to discuss her BP meds. BP is not controlled on Losartan, readings are in the 150's or higher.     HPI: Rebecca Rich 72 y.o. come in for  Post hosptial for GI bleed   To fu with dr  Collene Mares   no bleeding  Now   And not returned .    But bp is high and needs to  Have med adjusted Also  potassium pills  Are hard to swallow and would like to go back to the  [place] pills  Previously . Taking losartan chlorthalidone and  Potassium   Weight up some cause of [employer] cookie season.    Feels no need for gout suppression as doing dietary  Control    ROS: See pertinent positives and negatives per HPI. No cp sob  Falling or current bleeding  Rest of ros [place] or no change   Past Medical History:  Diagnosis Date  . Acute bronchospasm 08/24/2009  . Acute sphenoidal sinusitis 02/26/2010   Qualifier: Diagnosis of  By: Regis Bill MD, Standley Brooking   . CARDIAC MURMUR 12/08/2006  . COLONIC POLYPS, HX OF 12/08/2006  . DIABETES MELLITUS, TYPE II 12/08/2006  . Gallbladder/common duct stone, without infection, with obstruction 03/01/2010   removed ercp  . HYPERLIPIDEMIA 12/08/2006  . HYPERTENSION 12/08/2006  . 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 INFECTION, SKIN AND SOFT TISSUE 07/28/2008  . 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 LIVER FUNCTION TESTS, ABNORMAL 07/28/2008  . Morbid obesity ([place]) 12/08/2006  . OBESITY 09/24/2009  . OBSTRUCTIVE SLEEP APNEA 12/08/2006  . OSTEOARTHRITIS 12/08/2006  . RUQ PAIN 06/16/2008  . SHOULDER PAIN, RIGHT 02/07/2008  . Sleep apnea   . Swelling of limb 07/28/2008  . THYROID FUNCTION TEST, ABNORMAL 12/08/2006    Family History  Problem Relation Age of Onset  . Other Mother        blood clots  . Cervical cancer Mother   . Cancer Mother   . Heart disease Brother   . Diabetes Brother   . Heart disease Sister   . Diabetes Sister     . Heart disease Brother   . Diabetes Brother   . Heart disease Brother   . Diabetes Brother   . Stroke Sister   . Diabetes Sister   . Throat cancer Father   . Cancer Father   . Diabetes Other        all siblings, 4 brothers, 5 sisters    Social History   Socioeconomic History  . Marital status: Single    Spouse name: None  . Number of children: None  . Years of education: None  . Highest education level: None  Social Needs  . Financial resource strain: None  . Food insecurity - worry: None  . Food insecurity - inability: None  . Transportation needs - medical: None  . Transportation needs - non-medical: None  Occupational History  . Occupation: retired    Fish farm manager: RETIRED  Tobacco Use  . Smoking status: Never Smoker  . Smokeless tobacco: Never Used  Substance and Sexual Activity  . Alcohol use: No    Alcohol/week: 0.0 oz  . Drug use: No  . Sexual activity: None  Other Topics Concern  . None  Social History Narrative   Master level education in math   Pt is currently retired  Pt is divorced with children   Recently had to move had a break in and thus away from her pool exercise    Outpatient Medications Prior to Visit  Medication Sig Dispense Refill  . chlorthalidone (HYGROTON) 25 MG tablet Take 1 tablet (25 mg total) by mouth daily. 90 tablet 1  . cholecalciferol (VITAMIN D) 1000 UNITS tablet Take 1,000 Units by mouth daily.    Marland Kitchen glucose blood (ACCU-CHEK AVIVA PLUS) test strip Use to check blood sugar daily E11.9 100 each 12  . Lancets (ACCU-CHEK SOFT TOUCH) lancets Use to test blood glucose twice daily 50 each 12  . levothyroxine (SYNTHROID) 75 MCG tablet Take 1 tablet (75 mcg total) by mouth daily before breakfast. 90 tablet 3  . losartan (COZAAR) 100 MG tablet Take 1 tablet (100 mg total) by mouth daily. May begin at 50 mg per day and increase to 100 mg 90 tablet 1  . Misc Natural Products (OSTEO BI-FLEX ADV JOINT SHIELD PO) Take 1 tablet by mouth daily.     . Multiple Vitamins-Minerals (CENTRUM SILVER PO) Take 1 tablet by mouth daily.      . potassium chloride (K-DUR,KLOR-CON) 10 MEQ tablet Take 2 tablets (20 mEq total) by mouth daily. 60 tablet 0   No facility-administered medications prior to visit.      EXAM:  BP (!) 154/66 (BP Location: Right Arm, Patient Position: Sitting, Cuff Size: Large)   Pulse 94   Temp 99.9 F (37.7 C) (Oral)   Wt 286 lb 9.6 oz (130 kg)   BMI 54.15 kg/m   Body mass index is 54.15 kg/m.  GENERAL: vitals reviewed and listed above, alert, oriented, appears well hydrated and in no acute distress HEENT: atraumatic, conjunctiva  clear, no obvious abnormalities on inspection of external nose and ears  NECK: no obvious masses on inspection palpation  LUNGS: clear to auscultation bilaterally, no wheezes, rales or rhonchi, good air movement CV: HRRR, no clubbing cyanosis or  peripheral edema nl cap refill   2 /6 sem  MS: moves all extremities without noticeable focal  abnormality PSYCH: pleasant and cooperative, no obvious depression or anxiety Lab Results  Component Value Date   WBC 5.0 02/18/2017   HGB 10.9 (L) 02/18/2017   HCT 33.9 (L) 02/18/2017   PLT 137 (L) 02/18/2017   GLUCOSE 123 (H) 02/18/2017   CHOL 133 07/22/2016   TRIG 118.0 07/22/2016   HDL 47.90 07/22/2016   LDLCALC 61 07/22/2016   ALT 17 02/17/2017   AST 19 02/17/2017   NA 141 02/18/2017   K 3.2 (L) 02/18/2017   CL 107 02/18/2017   CREATININE 1.09 (H) 02/18/2017   BUN 11 02/18/2017   CO2 24 02/18/2017   TSH 2.93 07/22/2016   HGBA1C 6.6 11/04/2016   MICROALBUR 0.4 08/02/2009   BP Readings from Last 3 Encounters:  03/03/17 (!) 154/66  02/18/17 (!) 178/56  11/04/16 134/78    ASSESSMENT AND PLAN:  Discussed the following assessment and plan:  Essential hypertension - Plan: CBC with Differential/Platelet, Basic metabolic panel, Magnesium  Medication management - Plan: CBC with Differential/Platelet, Basic metabolic panel,  Magnesium  Low blood potassium - Plan: CBC with Differential/Platelet, Basic metabolic panel, Magnesium  Anemia, unspecified type - Plan: CBC with Differential/Platelet, Basic metabolic panel, Magnesium  Hospital discharge follow-up  Type 2 diabetes mellitus with hyperosmolarity without coma, without long-term current use of insulin (HCC) - Plan: Hemoglobin A1c  Morbid obesity (HCC)  Gastrointestinal hemorrhage, unspecified gastrointestinal hemorrhage type Change potassium pill  To oblong easier to swallow   Lab today  reviewed record  Add  Amlodipine 5 mg   For now stay on losartan says controlling gout   With diet and no sx .  Anemia check post gi bleed  And to fu with dr Collene Mares  Record review   dont  Think changing arb to ace will get better bp control  Add amlodipine  And rov 2 months   -Patient advised to return or notify health care team  if  new concerns arise.  Patient Instructions     Wt Readings from Last 3 Encounters:  03/03/17 286 lb 9.6 oz (130 kg)  02/18/17 292 lb 5.3 oz (132.6 kg)  11/04/16 279 lb 3.2 oz (126.6 kg)   Stay on the losartan   Add  Amlodipine .  To see  If helps blood pressure  Better .   Will change potassium pill  To er 10 meq tab oval   Will notify you  of labs when available.      Standley Brooking. Shalane Florendo M.D.

## 2017-03-03 ENCOUNTER — Encounter: Payer: Self-pay | Admitting: Internal Medicine

## 2017-03-03 ENCOUNTER — Ambulatory Visit (INDEPENDENT_AMBULATORY_CARE_PROVIDER_SITE_OTHER): Payer: Medicare Other | Admitting: Internal Medicine

## 2017-03-03 VITALS — BP 154/66 | HR 94 | Temp 99.9°F | Wt 286.6 lb

## 2017-03-03 DIAGNOSIS — K922 Gastrointestinal hemorrhage, unspecified: Secondary | ICD-10-CM | POA: Diagnosis not present

## 2017-03-03 DIAGNOSIS — E876 Hypokalemia: Secondary | ICD-10-CM

## 2017-03-03 DIAGNOSIS — E1100 Type 2 diabetes mellitus with hyperosmolarity without nonketotic hyperglycemic-hyperosmolar coma (NKHHC): Secondary | ICD-10-CM

## 2017-03-03 DIAGNOSIS — Z79899 Other long term (current) drug therapy: Secondary | ICD-10-CM | POA: Diagnosis not present

## 2017-03-03 DIAGNOSIS — Z09 Encounter for follow-up examination after completed treatment for conditions other than malignant neoplasm: Secondary | ICD-10-CM | POA: Diagnosis not present

## 2017-03-03 DIAGNOSIS — I10 Essential (primary) hypertension: Secondary | ICD-10-CM

## 2017-03-03 DIAGNOSIS — D649 Anemia, unspecified: Secondary | ICD-10-CM | POA: Diagnosis not present

## 2017-03-03 MED ORDER — AMLODIPINE BESYLATE 5 MG PO TABS: 5.0000 mg | ORAL_TABLET | Freq: Every day | ORAL | 3 refills | Status: DC

## 2017-03-03 MED ORDER — POTASSIUM CHLORIDE ER 10 MEQ PO CPCR: 10.0000 meq | ORAL_CAPSULE | Freq: Two times a day (BID) | ORAL | 3 refills | Status: DC

## 2017-03-03 NOTE — Patient Instructions (Signed)
   Wt Readings from Last 3 Encounters:  03/03/17 286 lb 9.6 oz (130 kg)  02/18/17 292 lb 5.3 oz (132.6 kg)  11/04/16 279 lb 3.2 oz (126.6 kg)   Stay on the losartan   Add  Amlodipine .  To see  If helps blood pressure  Better .   Will change potassium pill  To er 10 meq tab oval   Will notify you  of labs when available.

## 2017-03-04 LAB — CBC WITH DIFFERENTIAL/PLATELET
Basophils Absolute: 0 10*3/uL (ref 0.0–0.1)
Basophils Relative: 0.4 % (ref 0.0–3.0)
Eosinophils Absolute: 0.1 10*3/uL (ref 0.0–0.7)
Eosinophils Relative: 1.6 % (ref 0.0–5.0)
HEMATOCRIT: 38.8 % (ref 36.0–46.0)
Hemoglobin: 12.9 g/dL (ref 12.0–15.0)
LYMPHS ABS: 1.6 10*3/uL (ref 0.7–4.0)
Lymphocytes Relative: 32.9 % (ref 12.0–46.0)
MCHC: 33.3 g/dL (ref 30.0–36.0)
MCV: 90.5 fl (ref 78.0–100.0)
MONOS PCT: 7.9 % (ref 3.0–12.0)
Monocytes Absolute: 0.4 10*3/uL (ref 0.1–1.0)
NEUTROS ABS: 2.9 10*3/uL (ref 1.4–7.7)
NEUTROS PCT: 57.2 % (ref 43.0–77.0)
PLATELETS: 175 10*3/uL (ref 150.0–400.0)
RBC: 4.28 Mil/uL (ref 3.87–5.11)
RDW: 14.7 % (ref 11.5–15.5)
WBC: 5 10*3/uL (ref 4.0–10.5)

## 2017-03-04 LAB — BASIC METABOLIC PANEL
BUN: 20 mg/dL (ref 6–23)
CALCIUM: 9.2 mg/dL (ref 8.4–10.5)
CO2: 29 meq/L (ref 19–32)
CREATININE: 1.07 mg/dL (ref 0.40–1.20)
Chloride: 104 mEq/L (ref 96–112)
GFR: 64.49 mL/min (ref >60.00)
Glucose, Bld: 143 mg/dL — ABNORMAL HIGH (ref 70–99)
Potassium: 3.9 mEq/L (ref 3.5–5.1)
Sodium: 140 mEq/L (ref 135–145)

## 2017-03-04 LAB — MAGNESIUM: Magnesium: 2 mg/dL (ref 1.5–2.5)

## 2017-03-04 LAB — HEMOGLOBIN A1C: HEMOGLOBIN A1C: 7.3 % — AB (ref 4.6–6.5)

## 2017-04-15 ENCOUNTER — Other Ambulatory Visit: Payer: Self-pay | Admitting: Internal Medicine

## 2017-04-27 ENCOUNTER — Other Ambulatory Visit: Payer: Self-pay | Admitting: Internal Medicine

## 2017-05-01 ENCOUNTER — Other Ambulatory Visit: Payer: Self-pay | Admitting: Internal Medicine

## 2017-05-05 ENCOUNTER — Ambulatory Visit (INDEPENDENT_AMBULATORY_CARE_PROVIDER_SITE_OTHER): Payer: Medicare Other | Admitting: Internal Medicine

## 2017-05-05 ENCOUNTER — Encounter: Payer: Self-pay | Admitting: Internal Medicine

## 2017-05-05 VITALS — BP 146/78 | HR 82 | Temp 98.7°F | Wt 291.1 lb

## 2017-05-05 DIAGNOSIS — Z79899 Other long term (current) drug therapy: Secondary | ICD-10-CM

## 2017-05-05 DIAGNOSIS — E1100 Type 2 diabetes mellitus with hyperosmolarity without nonketotic hyperglycemic-hyperosmolar coma (NKHHC): Secondary | ICD-10-CM

## 2017-05-05 DIAGNOSIS — I10 Essential (primary) hypertension: Secondary | ICD-10-CM

## 2017-05-05 NOTE — Patient Instructions (Addendum)
Please take      The losartan 100 mg    And the amlodipine for the BP  Control   If this doesn't .  work then we will do a different  Plan.   ROV in a month   .    To decide if helping .  And then check your  Chemistry level.

## 2017-05-05 NOTE — Progress Notes (Signed)
Chief Complaint  Patient presents with  . Follow-up    pt brought all meds with her today - wants to discuss what she needs to be taking. Pharmacy keeps changing brands on all of her medications and she is very confused on what her medications are that she is supposed to be taking. Pt also taking duplicate Potassium pills.     HPI: Rebecca Rich 72 y.o. come in for Chronic disease management   bp and dm   Fu  Just fininhjed traveling   Less exercise       Getting back to action  In next months   bgs are good   80 and 100.  But bp up an d confuxsed about  meds    Hs bottles  Wasn't taking the losartan ( green ovoid pill)    Potassium changes from capsule to pill  Taking amlodipine    Not sure helping  ROS: See pertinent positives and negatives per HPI. No cps ob falling    Past Medical History:  Diagnosis Date  . Acute bronchospasm 08/24/2009  . Acute sphenoidal sinusitis 02/26/2010   Qualifier: Diagnosis of  By: Regis Bill MD, Standley Brooking   . CARDIAC MURMUR 12/08/2006  . COLONIC POLYPS, HX OF 12/08/2006  . DIABETES MELLITUS, TYPE II 12/08/2006  . Gallbladder/common duct stone, without infection, with obstruction 03/01/2010   removed ercp  . HYPERLIPIDEMIA 12/08/2006  . HYPERTENSION 12/08/2006  . 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 INFECTION, SKIN AND SOFT TISSUE 07/28/2008  . 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 LIVER FUNCTION TESTS, ABNORMAL 07/28/2008  . Morbid obesity ([place]) 12/08/2006  . OBESITY 09/24/2009  . OBSTRUCTIVE SLEEP APNEA 12/08/2006  . OSTEOARTHRITIS 12/08/2006  . RUQ PAIN 06/16/2008  . SHOULDER PAIN, RIGHT 02/07/2008  . Sleep apnea   . Swelling of limb 07/28/2008  . THYROID FUNCTION TEST, ABNORMAL 12/08/2006    Family History  Problem Relation Age of Onset  . Other Mother        blood clots  . Cervical cancer Mother   . Cancer Mother   . Heart disease Brother   . Diabetes Brother   . Heart disease Sister   . Diabetes Sister   . Heart disease Brother   . Diabetes Brother   .  Heart disease Brother   . Diabetes Brother   . Stroke Sister   . Diabetes Sister   . Throat cancer Father   . Cancer Father   . Diabetes Other        all siblings, 4 brothers, 5 sisters    Social History   Socioeconomic History  . Marital status: Single    Spouse name: Not on file  . Number of children: Not on file  . Years of education: Not on file  . Highest education level: Not on file  Occupational History  . Occupation: retired    Fish farm manager: RETIRED  Social Needs  . Financial resource strain: Not on file  . Food insecurity:    Worry: Not on file    Inability: Not on file  . Transportation needs:    Medical: Not on file    Non-medical: Not on file  Tobacco Use  . Smoking status: Never Smoker  . Smokeless tobacco: Never Used  Substance and Sexual Activity  . Alcohol use: No    Alcohol/week: 0.0 oz  . Drug use: No  . Sexual activity: Not on file  Lifestyle  . Physical activity:    Days per week: Not on file  Minutes per session: Not on file  . Stress: Not on file  Relationships  . Social connections:    Talks on phone: Not on file    Gets together: Not on file    Attends religious service: Not on file    Active member of club or organization: Not on file    Attends meetings of clubs or organizations: Not on file    Relationship status: Not on file  Other Topics Concern  . Not on file  Social History Narrative   Master level education in math   Pt is currently retired   Pt is divorced with children   Recently had to move had a break in and thus away from her pool exercise    Outpatient Medications Prior to Visit  Medication Sig Dispense Refill  . amLODipine (NORVASC) 5 MG tablet TAKE 1 TABLET BY MOUTH EVERY DAY 30 tablet 2  . chlorthalidone (HYGROTON) 25 MG tablet TAKE 1 TABLET BY MOUTH EVERY DAY 90 tablet 0  . cholecalciferol (VITAMIN D) 1000 UNITS tablet Take 1,000 Units by mouth daily.    Marland Kitchen glucose blood (ACCU-CHEK AVIVA PLUS) test strip Use to  check blood sugar daily E11.9 100 each 12  . Lancets (ACCU-CHEK SOFT TOUCH) lancets Use to test blood glucose twice daily 50 each 12  . levothyroxine (SYNTHROID) 75 MCG tablet Take 1 tablet (75 mcg total) by mouth daily before breakfast. 90 tablet 3  . losartan (COZAAR) 100 MG tablet TAKE 1 TABLET (100 MG TOTAL) BY MOUTH DAILY. MAY BEGIN AT 50 MG PER DAY AND INCREASE TO 100 MG 90 tablet 1  . Misc Natural Products (OSTEO BI-FLEX ADV JOINT SHIELD PO) Take 1 tablet by mouth daily.    . Multiple Vitamins-Minerals (CENTRUM SILVER PO) Take 1 tablet by mouth daily.      . potassium chloride (K-DUR,KLOR-CON) 10 MEQ tablet Take 2 tablets (20 mEq total) by mouth daily. 60 tablet 0  . potassium chloride (MICRO-K) 10 MEQ CR capsule Take 1 capsule (10 mEq total) by mouth 2 (two) times daily. (Patient not taking: Reported on 05/05/2017) 60 capsule 3   No facility-administered medications prior to visit.      EXAM:  There were no vitals taken for this visit.  There is no height or weight on file to calculate BMI.  GENERAL: vitals reviewed and listed above, alert, oriented, appears well hydrated and in no acute distress HEENT: atraumatic, conjunctiva  clear, no obvious abnormalities on inspection of external nose and ears PSYCH: pleasant and cooperative, no obvious depression or anxiety Lab Results  Component Value Date   WBC 5.0 03/03/2017   HGB 12.9 03/03/2017   HCT 38.8 03/03/2017   PLT 175.0 03/03/2017   GLUCOSE 143 (H) 03/03/2017   CHOL 133 07/22/2016   TRIG 118.0 07/22/2016   HDL 47.90 07/22/2016   LDLCALC 61 07/22/2016   ALT 17 02/17/2017   AST 19 02/17/2017   NA 140 03/03/2017   K 3.9 03/03/2017   CL 104 03/03/2017   CREATININE 1.07 03/03/2017   BUN 20 03/03/2017   CO2 29 03/03/2017   TSH 2.93 07/22/2016   HGBA1C 7.3 (H) 03/03/2017   MICROALBUR 0.4 08/02/2009   BP Readings from Last 3 Encounters:  03/03/17 (!) 154/66  02/18/17 (!) 178/56  11/04/16 134/78   Wt Readings from Last  3 Encounters:  03/03/17 286 lb 9.6 oz (130 kg)  02/18/17 292 lb 5.3 oz (132.6 kg)  11/04/16 279 lb 3.2 oz (126.6 kg)  ASSESSMENT AND PLAN:  Discussed the following assessment and plan:  Essential hypertension  Medication management  Type 2 diabetes mellitus with hyperosmolarity without coma, without long-term current use of insulin (HCC) definite contusion about meds   Reviewed  At length and  Put on bottles what each med was for .  Since she hasnt been on the losartan?    Begin this in addition to other and then we will see her again in one month and plan check potasium and bp  Planning .  Can always add med  -Patient advised to return or notify health care team  if  new concerns arise. Total visit 30 mins > 50% spent counseling and coordinating care as indicated in above note and in instructions to patient .    Patient Instructions  Please take      The losartan 100 mg    And the amlodipine for the BP  Control   If this doesn't .  work then we will do a different  Plan.   ROV in a month   .    To decide if helping .  And then check your  Chemistry level.        Standley Brooking. Abbrielle Batts M.D.

## 2017-05-13 DIAGNOSIS — D500 Iron deficiency anemia secondary to blood loss (chronic): Secondary | ICD-10-CM | POA: Diagnosis not present

## 2017-05-13 DIAGNOSIS — K625 Hemorrhage of anus and rectum: Secondary | ICD-10-CM | POA: Diagnosis not present

## 2017-05-13 DIAGNOSIS — K5730 Diverticulosis of large intestine without perforation or abscess without bleeding: Secondary | ICD-10-CM | POA: Diagnosis not present

## 2017-05-17 ENCOUNTER — Other Ambulatory Visit: Payer: Self-pay | Admitting: Internal Medicine

## 2017-05-20 NOTE — Telephone Encounter (Addendum)
[id number] [address], [place] [phone] (Phone) [phone] (Fax)      Reason for call:  Pharmacy checking on the status of potassium chloride (K-DUR,KLOR-CON) 10 MEQ tablet refill 3rd request, please advise

## 2017-05-27 DIAGNOSIS — Z0000 Encounter for general adult medical examination without abnormal findings: Secondary | ICD-10-CM | POA: Diagnosis not present

## 2017-06-08 NOTE — Progress Notes (Signed)
Chief Complaint  Patient presents with  . Follow-up    Pt is still getting high BP readings at home with the Losartan and Amlodipine. Pt states that it does not seem to have made much difference.     HPI: Rebecca Rich 72 y.o. come in for Chronic disease management   144/66 -  150 range on wrist machine but feels fine.  Traveling  100 losartaqndnand amlodipine   And cholrthalidone   No se    Back to trip to [employer] a lot .   Takes break on the long trip and does well  ? bg   No swelling   Brings in meds  ROS: See pertinent positives and negatives per HPI. No scp sob  New sx  Edema   Past Medical History:  Diagnosis Date  . Acute bronchospasm 08/24/2009  . Acute sphenoidal sinusitis 02/26/2010   Qualifier: Diagnosis of  By: Regis Bill MD, Standley Brooking   . CARDIAC MURMUR 12/08/2006  . COLONIC POLYPS, HX OF 12/08/2006  . DIABETES MELLITUS, TYPE II 12/08/2006  . Gallbladder/common duct stone, without infection, with obstruction 03/01/2010   removed ercp  . HYPERLIPIDEMIA 12/08/2006  . HYPERTENSION 12/08/2006  . 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 INFECTION, SKIN AND SOFT TISSUE 07/28/2008  . 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 LIVER FUNCTION TESTS, ABNORMAL 07/28/2008  . Morbid obesity ([place]) 12/08/2006  . OBESITY 09/24/2009  . OBSTRUCTIVE SLEEP APNEA 12/08/2006  . OSTEOARTHRITIS 12/08/2006  . RUQ PAIN 06/16/2008  . SHOULDER PAIN, RIGHT 02/07/2008  . Sleep apnea   . Swelling of limb 07/28/2008  . THYROID FUNCTION TEST, ABNORMAL 12/08/2006    Family History  Problem Relation Age of Onset  . Other Mother        blood clots  . Cervical cancer Mother   . Cancer Mother   . Heart disease Brother   . Diabetes Brother   . Heart disease Sister   . Diabetes Sister   . Heart disease Brother   . Diabetes Brother   . Heart disease Brother   . Diabetes Brother   . Stroke Sister   . Diabetes Sister   . Throat cancer Father   . Cancer Father   . Diabetes Other        all siblings, 4 brothers, 5 sisters     Social History   Socioeconomic History  . Marital status: Single    Spouse name: Not on file  . Number of children: Not on file  . Years of education: Not on file  . Highest education level: Not on file  Occupational History  . Occupation: retired    Fish farm manager: RETIRED  Social Needs  . Financial resource strain: Not on file  . Food insecurity:    Worry: Not on file    Inability: Not on file  . Transportation needs:    Medical: Not on file    Non-medical: Not on file  Tobacco Use  . Smoking status: Never Smoker  . Smokeless tobacco: Never Used  Substance and Sexual Activity  . Alcohol use: No    Alcohol/week: 0.0 oz  . Drug use: No  . Sexual activity: Not on file  Lifestyle  . Physical activity:    Days per week: Not on file    Minutes per session: Not on file  . Stress: Not on file  Relationships  . Social connections:    Talks on phone: Not on file    Gets together: Not on file  Attends religious service: Not on file    Active member of club or organization: Not on file    Attends meetings of clubs or organizations: Not on file    Relationship status: Not on file  Other Topics Concern  . Not on file  Social History Narrative   Master level education in math   Pt is currently retired   Pt is divorced with children   Recently had to move had a break in and thus away from her pool exercise    Outpatient Medications Prior to Visit  Medication Sig Dispense Refill  . amLODipine (NORVASC) 5 MG tablet TAKE 1 TABLET BY MOUTH EVERY DAY 30 tablet 2  . chlorthalidone (HYGROTON) 25 MG tablet TAKE 1 TABLET BY MOUTH EVERY DAY 90 tablet 0  . cholecalciferol (VITAMIN D) 1000 UNITS tablet Take 1,000 Units by mouth daily.    Marland Kitchen glucose blood (ACCU-CHEK AVIVA PLUS) test strip Use to check blood sugar daily E11.9 100 each 12  . KLOR-CON 10 10 MEQ tablet TAKE 2 TABLETS (20 MEQ TOTAL) BY MOUTH DAILY. 60 tablet 0  . Lancets (ACCU-CHEK SOFT TOUCH) lancets Use to test blood  glucose twice daily 50 each 12  . levothyroxine (SYNTHROID) 75 MCG tablet Take 1 tablet (75 mcg total) by mouth daily before breakfast. 90 tablet 3  . losartan (COZAAR) 100 MG tablet TAKE 1 TABLET (100 MG TOTAL) BY MOUTH DAILY. MAY BEGIN AT 50 MG PER DAY AND INCREASE TO 100 MG 90 tablet 1  . Misc Natural Products (OSTEO BI-FLEX ADV JOINT SHIELD PO) Take 1 tablet by mouth daily.    . Multiple Vitamins-Minerals (CENTRUM SILVER PO) Take 1 tablet by mouth daily.       No facility-administered medications prior to visit.      EXAM:  BP (!) 128/54 (BP Location: Left Arm, Patient Position: Sitting, Cuff Size: Large)   Pulse 78   Temp 98.6 F (37 C) (Oral)   Wt 287 lb 4.8 oz (130.3 kg)   BMI 54.28 kg/m   Body mass index is 54.28 kg/m. repeat bp large cuff left arm  124/60  rr  GENERAL: vitals reviewed and listed above, alert, oriented, appears well hydrated and in no acute distress HEENT: atraumatic, conjunctiva  clear, no obvious abnormalities on inspection of external nose and ears  NECK: no obvious masses on inspection palpation  LUNGS: clear to auscultation bilaterally, no wheezes, rales or rhonchi, CV: HRRR, no clubbing cyanosis or  peripheral edema nl cap refill  MS: moves all extremities without noticeable focal  abnormality PSYCH: pleasant and cooperative, no obvious depression or anxiety Lab Results  Component Value Date   WBC 5.0 03/03/2017   HGB 12.9 03/03/2017   HCT 38.8 03/03/2017   PLT 175.0 03/03/2017   GLUCOSE 143 (H) 03/03/2017   CHOL 133 07/22/2016   TRIG 118.0 07/22/2016   HDL 47.90 07/22/2016   LDLCALC 61 07/22/2016   ALT 17 02/17/2017   AST 19 02/17/2017   NA 140 03/03/2017   K 3.9 03/03/2017   CL 104 03/03/2017   CREATININE 1.07 03/03/2017   BUN 20 03/03/2017   CO2 29 03/03/2017   TSH 2.93 07/22/2016   HGBA1C 7.3 (H) 03/03/2017   MICROALBUR 0.4 08/02/2009   BP Readings from Last 3 Encounters:  06/09/17 (!) 128/54  05/05/17 (!) 146/78  03/03/17 (!)  154/66   Wt Readings from Last 3 Encounters:  06/09/17 287 lb 4.8 oz (130.3 kg)  05/05/17 291 lb 1.6 oz (  132 kg)  03/03/17 286 lb 9.6 oz (130 kg)    Med reviewe with patient  ASSESSMENT AND PLAN:  Discussed the following assessment and plan:  Essential hypertension - Plan: Lipid panel, TSH, Basic metabolic panel, Hemoglobin A1c, Hepatic function panel  Type 2 diabetes mellitus with hyperosmolarity without coma, without long-term current use of insulin (HCC) - Plan: Lipid panel, TSH, Basic metabolic panel, Hemoglobin A1c, Hepatic function panel  Medication management - Plan: Lipid panel, TSH, Basic metabolic panel, Hemoglobin A1c, Hepatic function panel  Morbid obesity ([place]) - Plan: Lipid panel, TSH, Basic metabolic panel, Hemoglobin A1c, Hepatic function panel  Hypothyroidism, unspecified type - Plan: Lipid panel, TSH, Basic metabolic panel, Hemoglobin A1c, Hepatic function panel Due for monitoring labs soon will check today bmp with  tsh  Etc .  Office readings are good  Need to correlate and check  Technique of  Wrist  Readings at home at this time.   Stay on same meds and plan rov in 2 mos with machine ( can work in when convenient )  -Patient advised to return or notify health care team  if  new concerns arise.  Patient Instructions  Your blood pressure is better       Lab   Today   Stay on same  Medication   Plan ROV in about 2 months  BRING THE   Wrist machine  to next visit .     Standley Brooking. Chalyn Amescua M.D.

## 2017-06-09 ENCOUNTER — Ambulatory Visit (INDEPENDENT_AMBULATORY_CARE_PROVIDER_SITE_OTHER): Payer: Medicare Other | Admitting: Internal Medicine

## 2017-06-09 ENCOUNTER — Encounter: Payer: Self-pay | Admitting: Internal Medicine

## 2017-06-09 VITALS — BP 128/54 | HR 78 | Temp 98.6°F | Wt 287.3 lb

## 2017-06-09 DIAGNOSIS — E039 Hypothyroidism, unspecified: Secondary | ICD-10-CM | POA: Diagnosis not present

## 2017-06-09 DIAGNOSIS — E1100 Type 2 diabetes mellitus with hyperosmolarity without nonketotic hyperglycemic-hyperosmolar coma (NKHHC): Secondary | ICD-10-CM

## 2017-06-09 DIAGNOSIS — Z79899 Other long term (current) drug therapy: Secondary | ICD-10-CM | POA: Diagnosis not present

## 2017-06-09 DIAGNOSIS — I10 Essential (primary) hypertension: Secondary | ICD-10-CM | POA: Diagnosis not present

## 2017-06-09 LAB — LIPID PANEL
CHOLESTEROL: 124 mg/dL (ref 0–200)
HDL: 53.5 mg/dL (ref >39.00)
LDL Cholesterol: 53 mg/dL (ref 0–99)
NonHDL: 70.5
Total CHOL/HDL Ratio: 2
Triglycerides: 87 mg/dL (ref 0.0–149.0)
VLDL: 17.4 mg/dL (ref 0.0–40.0)

## 2017-06-09 LAB — HEPATIC FUNCTION PANEL
ALBUMIN: 3.6 g/dL (ref 3.5–5.2)
ALK PHOS: 63 U/L (ref 39–117)
ALT: 11 U/L (ref 0–35)
AST: 10 U/L (ref 0–37)
BILIRUBIN DIRECT: 0.1 mg/dL (ref 0.0–0.3)
TOTAL PROTEIN: 6.4 g/dL (ref 6.0–8.3)
Total Bilirubin: 0.6 mg/dL (ref 0.2–1.2)

## 2017-06-09 LAB — BASIC METABOLIC PANEL
BUN: 22 mg/dL (ref 6–23)
CHLORIDE: 105 meq/L (ref 96–112)
CO2: 27 meq/L (ref 19–32)
Calcium: 9 mg/dL (ref 8.4–10.5)
Creatinine, Ser: 0.99 mg/dL (ref 0.40–1.20)
GFR: 70.48 mL/min (ref >60.00)
Glucose, Bld: 142 mg/dL — ABNORMAL HIGH (ref 70–99)
Potassium: 3.2 mEq/L — ABNORMAL LOW (ref 3.5–5.1)
SODIUM: 144 meq/L (ref 135–145)

## 2017-06-09 LAB — TSH: TSH: 2.89 u[IU]/mL (ref 0.35–4.50)

## 2017-06-09 LAB — HEMOGLOBIN A1C: Hgb A1c MFr Bld: 7.9 % — ABNORMAL HIGH (ref 4.6–6.5)

## 2017-06-09 NOTE — Patient Instructions (Addendum)
Your blood pressure is better       Lab   Today   Stay on same  Medication   Plan ROV in about 2 months  BRING THE   Wrist machine  to next visit .

## 2017-06-17 ENCOUNTER — Other Ambulatory Visit: Payer: Self-pay | Admitting: *Deleted

## 2017-06-17 MED ORDER — POTASSIUM CHLORIDE ER 10 MEQ PO TBCR: 10.0000 meq | EXTENDED_RELEASE_TABLET | Freq: Three times a day (TID) | ORAL | 2 refills | Status: DC

## 2017-07-15 ENCOUNTER — Other Ambulatory Visit: Payer: Self-pay | Admitting: Internal Medicine

## 2017-07-16 ENCOUNTER — Other Ambulatory Visit: Payer: Self-pay | Admitting: Internal Medicine

## 2017-08-04 ENCOUNTER — Other Ambulatory Visit: Payer: Self-pay | Admitting: Internal Medicine

## 2017-08-04 ENCOUNTER — Encounter: Payer: Self-pay | Admitting: Internal Medicine

## 2017-08-04 ENCOUNTER — Ambulatory Visit (INDEPENDENT_AMBULATORY_CARE_PROVIDER_SITE_OTHER): Payer: Medicare Other | Admitting: Internal Medicine

## 2017-08-04 VITALS — BP 115/50 | HR 71 | Temp 98.5°F | Wt 283.0 lb

## 2017-08-04 DIAGNOSIS — R42 Dizziness and giddiness: Secondary | ICD-10-CM

## 2017-08-04 DIAGNOSIS — E782 Mixed hyperlipidemia: Secondary | ICD-10-CM

## 2017-08-04 DIAGNOSIS — E876 Hypokalemia: Secondary | ICD-10-CM | POA: Diagnosis not present

## 2017-08-04 DIAGNOSIS — I10 Essential (primary) hypertension: Secondary | ICD-10-CM | POA: Diagnosis not present

## 2017-08-04 DIAGNOSIS — E1169 Type 2 diabetes mellitus with other specified complication: Secondary | ICD-10-CM

## 2017-08-04 DIAGNOSIS — Z79899 Other long term (current) drug therapy: Secondary | ICD-10-CM | POA: Diagnosis not present

## 2017-08-04 DIAGNOSIS — E669 Obesity, unspecified: Secondary | ICD-10-CM | POA: Diagnosis not present

## 2017-08-04 LAB — CBC WITH DIFFERENTIAL/PLATELET
BASOS ABS: 0 10*3/uL (ref 0.0–0.1)
BASOS PCT: 0.3 % (ref 0.0–3.0)
EOS ABS: 0.1 10*3/uL (ref 0.0–0.7)
Eosinophils Relative: 2.8 % (ref 0.0–5.0)
HCT: 36.1 % (ref 36.0–46.0)
Hemoglobin: 12.3 g/dL (ref 12.0–15.0)
Lymphocytes Relative: 32 % (ref 12.0–46.0)
Lymphs Abs: 1.6 10*3/uL (ref 0.7–4.0)
MCHC: 34 g/dL (ref 30.0–36.0)
MCV: 89.6 fl (ref 78.0–100.0)
MONO ABS: 0.4 10*3/uL (ref 0.1–1.0)
Monocytes Relative: 8.3 % (ref 3.0–12.0)
Neutro Abs: 2.9 10*3/uL (ref 1.4–7.7)
Neutrophils Relative %: 56.6 % (ref 43.0–77.0)
PLATELETS: 160 10*3/uL (ref 150.0–400.0)
RBC: 4.03 Mil/uL (ref 3.87–5.11)
RDW: 14 % (ref 11.5–15.5)
WBC: 5.1 10*3/uL (ref 4.0–10.5)

## 2017-08-04 LAB — BASIC METABOLIC PANEL
BUN: 19 mg/dL (ref 6–23)
CALCIUM: 9.2 mg/dL (ref 8.4–10.5)
CHLORIDE: 103 meq/L (ref 96–112)
CO2: 29 mEq/L (ref 19–32)
CREATININE: 1.04 mg/dL (ref 0.40–1.20)
GFR: 66.56 mL/min (ref >60.00)
Glucose, Bld: 149 mg/dL — ABNORMAL HIGH (ref 70–99)
Potassium: 4 mEq/L (ref 3.5–5.1)
Sodium: 139 mEq/L (ref 135–145)

## 2017-08-04 LAB — MAGNESIUM: MAGNESIUM: 1.8 mg/dL (ref 1.5–2.5)

## 2017-08-04 NOTE — Progress Notes (Signed)
Chief Complaint  Patient presents with  . Follow-up    HPI: Rebecca Rich 72 y.o. come in for Chronic disease management    BP has been good and no se    readings t home in 140 range   Feels better    And working on dec sugars  Down to 120 range  140 range    Has had  had vertigo sine am Friday  And ongoing    Hx of same  once with sinus infecting and once in past  Resolved with a sedating shot. No events diplopia fever sinus congestion stays seems like   Throb and heaviness   In front.    No specific weakness or neuro sx otherwise and no falling  ROS: See pertinent positives and negatives per HPI. No cp sob  Hearing loss   Past Medical History:  Diagnosis Date  . Acute bronchospasm 08/24/2009  . Acute sphenoidal sinusitis 02/26/2010   Qualifier: Diagnosis of  By: Regis Bill MD, Standley Brooking   . CARDIAC MURMUR 12/08/2006  . COLONIC POLYPS, HX OF 12/08/2006  . DIABETES MELLITUS, TYPE II 12/08/2006  . Gallbladder/common duct stone, without infection, with obstruction 03/01/2010   removed ercp  . HYPERLIPIDEMIA 12/08/2006  . HYPERTENSION 12/08/2006  . 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 INFECTION, SKIN AND SOFT TISSUE 07/28/2008  . 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 LIVER FUNCTION TESTS, ABNORMAL 07/28/2008  . Morbid obesity ([place]) 12/08/2006  . OBESITY 09/24/2009  . OBSTRUCTIVE SLEEP APNEA 12/08/2006  . OSTEOARTHRITIS 12/08/2006  . RUQ PAIN 06/16/2008  . SHOULDER PAIN, RIGHT 02/07/2008  . Sleep apnea   . Swelling of limb 07/28/2008  . THYROID FUNCTION TEST, ABNORMAL 12/08/2006    Family History  Problem Relation Age of Onset  . Other Mother        blood clots  . Cervical cancer Mother   . Cancer Mother   . Heart disease Brother   . Diabetes Brother   . Heart disease Sister   . Diabetes Sister   . Heart disease Brother   . Diabetes Brother   . Heart disease Brother   . Diabetes Brother   . Stroke Sister   . Diabetes Sister   . Throat cancer Father   . Cancer Father   . Diabetes Other     all siblings, 4 brothers, 5 sisters    Social History   Socioeconomic History  . Marital status: Divorced    Spouse name: Not on file  . Number of children: Not on file  . Years of education: Not on file  . Highest education level: Not on file  Occupational History  . Occupation: retired    Fish farm manager: RETIRED  Social Needs  . Financial resource strain: Not on file  . Food insecurity:    Worry: Not on file    Inability: Not on file  . Transportation needs:    Medical: Not on file    Non-medical: Not on file  Tobacco Use  . Smoking status: Never Smoker  . Smokeless tobacco: Never Used  Substance and Sexual Activity  . Alcohol use: No    Alcohol/week: 0.0 oz  . Drug use: No  . Sexual activity: Not on file  Lifestyle  . Physical activity:    Days per week: Not on file    Minutes per session: Not on file  . Stress: Not on file  Relationships  . Social connections:    Talks on phone: Not on file  Gets together: Not on file    Attends religious service: Not on file    Active member of club or organization: Not on file    Attends meetings of clubs or organizations: Not on file    Relationship status: Not on file  Other Topics Concern  . Not on file  Social History Narrative   Master level education in math   Pt is currently retired   Pt is divorced with children   Recently had to move had a break in and thus away from her pool exercise    Outpatient Medications Prior to Visit  Medication Sig Dispense Refill  . amLODipine (NORVASC) 5 MG tablet TAKE 1 TABLET BY MOUTH EVERY DAY 90 tablet 0  . chlorthalidone (HYGROTON) 25 MG tablet TAKE 1 TABLET BY MOUTH EVERY DAY 90 tablet 0  . cholecalciferol (VITAMIN D) 1000 UNITS tablet Take 1,000 Units by mouth daily.    Marland Kitchen glucose blood (ACCU-CHEK AVIVA PLUS) test strip Use to check blood sugar daily E11.9 100 each 12  . Lancets (ACCU-CHEK SOFT TOUCH) lancets Use to test blood glucose twice daily 50 each 12  . levothyroxine  (SYNTHROID, LEVOTHROID) 75 MCG tablet TAKE 1 TABLET (75 MCG TOTAL) BY MOUTH DAILY BEFORE BREAKFAST. 90 tablet 0  . losartan (COZAAR) 100 MG tablet TAKE 1 TABLET (100 MG TOTAL) BY MOUTH DAILY. MAY BEGIN AT 50 MG PER DAY AND INCREASE TO 100 MG 90 tablet 1  . Misc Natural Products (OSTEO BI-FLEX ADV JOINT SHIELD PO) Take 1 tablet by mouth daily.    . Multiple Vitamins-Minerals (CENTRUM SILVER PO) Take 1 tablet by mouth daily.      . potassium chloride (KLOR-CON 10) 10 MEQ tablet Take 1 tablet (10 mEq total) by mouth 3 (three) times daily. 90 tablet 2   No facility-administered medications prior to visit.      EXAM:  BP (!) 115/50   Pulse 71   Temp 98.5 F (36.9 C)   Wt 283 lb (128.4 kg)   BMI 53.47 kg/m   Body mass index is 53.47 kg/m.  GENERAL: vitals reviewed and listed above, alert, oriented, appears well hydrated and in no acute distress walks moves gingerly but non toxic  Nl speech  Ability to use phone etc  HEENT: atraumatic, conjunctiva  clear, no obvious abnormalities on inspection of external nose and earseoms full but makes her dizzy  OP : no lesion edema or exudate  NECK: no obvious masses on inspection palpation  LUNGS: clear to auscultation bilaterally, no wheezes, rales or rhonchi, CV: HRRR,2/6 sem usb no radiation  no clubbing cyanosis or  peripheral edema nl cap refill  MS: moves all extremities without noticeable focal  abnormality PSYCH: pleasant and cooperative, no obvious depression or anxiety Neuro forssly non focal  Lab Results  Component Value Date   WBC 5.1 08/04/2017   HGB 12.3 08/04/2017   HCT 36.1 08/04/2017   PLT 160.0 08/04/2017   GLUCOSE 149 (H) 08/04/2017   CHOL 124 06/09/2017   TRIG 87.0 06/09/2017   HDL 53.50 06/09/2017   LDLCALC 53 06/09/2017   ALT 11 06/09/2017   AST 10 06/09/2017   NA 139 08/04/2017   K 4.0 08/04/2017   CL 103 08/04/2017   CREATININE 1.04 08/04/2017   BUN 19 08/04/2017   CO2 29 08/04/2017   TSH 2.89 06/09/2017    HGBA1C 7.9 (H) 06/09/2017   MICROALBUR 0.4 08/02/2009   BP Readings from Last 3 Encounters:  08/04/17 (!) 115/50  06/09/17 (!) 128/54  05/05/17 (!) 146/78   Wt Readings from Last 3 Encounters:  08/04/17 283 lb (128.4 kg)  06/09/17 287 lb 4.8 oz (130.3 kg)  05/05/17 291 lb 1.6 oz (132 kg)     ASSESSMENT AND PLAN:  Discussed the following assessment and plan:  Medication management - Plan: Basic metabolic panel, CBC with Differential/Platelet, Magnesium  Diabetes mellitus type 2 in obese ([place]) - Plan: Basic metabolic panel, CBC with Differential/Platelet, Magnesium  HYPERLIPIDEMIA  Morbid obesity ([place])  Essential hypertension - Plan: Basic metabolic panel, CBC with Differential/Platelet, Magnesium  Low blood potassium - Plan: Basic metabolic panel, CBC with Differential/Platelet, Magnesium  Vertigo - Plan: Basic metabolic panel, CBC with Differential/Platelet, Magnesium Potassium 3.2     Taking 3 10 meq potass per day   a1c is due 8 9 19   Vertigo seems peripheral and optino of meds etc  She did have a sinusitis in past  But at that time had chills ad assoc sx    Decided to  Hold off on scanning or other  And get hydrated and see how goes   doesnt think low bp at home   Risk benefit of medication discussed.  Total visit 40mins > 50% spent counseling and coordinating care as indicated in above note and in instructions to patient .    -Patient advised to return or notify health care team  if  new concerns arise.  Patient Instructions  Your blood pressure is better   No change on meds .    Vertigo  Can try meclizine for now can make your drowsy.   If getting  Fever or severe headache get back with [place].    Checking potassium  and blood count today .  ROV   Plan   In 4 months         Standley Brooking. Dominyk Law M.D.

## 2017-08-04 NOTE — Patient Instructions (Addendum)
Your blood pressure is better   No change on meds .    Vertigo  Can try meclizine for now can make your drowsy.   If getting  Fever or severe headache get back with [place].    Checking potassium  and blood count today .  ROV   Plan   In 4 months

## 2017-09-23 ENCOUNTER — Other Ambulatory Visit: Payer: Self-pay | Admitting: Internal Medicine

## 2017-10-19 ENCOUNTER — Other Ambulatory Visit: Payer: Self-pay | Admitting: Internal Medicine

## 2017-10-22 ENCOUNTER — Other Ambulatory Visit: Payer: Self-pay | Admitting: Internal Medicine

## 2017-10-28 ENCOUNTER — Other Ambulatory Visit: Payer: Self-pay | Admitting: Internal Medicine

## 2017-12-08 ENCOUNTER — Ambulatory Visit (INDEPENDENT_AMBULATORY_CARE_PROVIDER_SITE_OTHER): Payer: Medicare Other | Admitting: Internal Medicine

## 2017-12-08 ENCOUNTER — Encounter: Payer: Self-pay | Admitting: Internal Medicine

## 2017-12-08 VITALS — BP 140/62 | HR 86 | Temp 98.7°F | Wt 287.9 lb

## 2017-12-08 DIAGNOSIS — G4733 Obstructive sleep apnea (adult) (pediatric): Secondary | ICD-10-CM

## 2017-12-08 DIAGNOSIS — E669 Obesity, unspecified: Secondary | ICD-10-CM

## 2017-12-08 DIAGNOSIS — Z2821 Immunization not carried out because of patient refusal: Secondary | ICD-10-CM | POA: Diagnosis not present

## 2017-12-08 DIAGNOSIS — Z79899 Other long term (current) drug therapy: Secondary | ICD-10-CM

## 2017-12-08 DIAGNOSIS — E1169 Type 2 diabetes mellitus with other specified complication: Secondary | ICD-10-CM

## 2017-12-08 DIAGNOSIS — I10 Essential (primary) hypertension: Secondary | ICD-10-CM

## 2017-12-08 DIAGNOSIS — Z9989 Dependence on other enabling machines and devices: Secondary | ICD-10-CM

## 2017-12-08 LAB — POCT GLYCOSYLATED HEMOGLOBIN (HGB A1C): HEMOGLOBIN A1C: 7.2 % — AB (ref 4.0–5.6)

## 2017-12-08 NOTE — Progress Notes (Signed)
Chief Complaint  Patient presents with  . Follow-up    Pt states that the BP meds are finally working - BP at baseline in 140s. BP 140/62 t      oday    HPI: Rebecca Rich 72 y.o. come in for Chronic disease management  Feels well today   Bp : Is more stable no fluctuating      Feels much better less swelling righ leg and feels well  [facility]      Planning other   Had soreness after new water aerobcs still will travel for afew more months and then not .  pland on more activity   Bg thinks is good   No numbness falling vision change  ROS: See pertinent positives and negatives per HPI.  Past Medical History:  Diagnosis Date  . Acute bronchospasm 08/24/2009  . Acute sphenoidal sinusitis 02/26/2010   Qualifier: Diagnosis of  By: Regis Bill MD, Standley Brooking   . CARDIAC MURMUR 12/08/2006  . COLONIC POLYPS, HX OF 12/08/2006  . DIABETES MELLITUS, TYPE II 12/08/2006  . Gallbladder/common duct stone, without infection, with obstruction 03/01/2010   removed ercp  . HYPERLIPIDEMIA 12/08/2006  . HYPERTENSION 12/08/2006  . 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 INFECTION, SKIN AND SOFT TISSUE 07/28/2008  . 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 LIVER FUNCTION TESTS, ABNORMAL 07/28/2008  . Morbid obesity ([place]) 12/08/2006  . OBESITY 09/24/2009  . OBSTRUCTIVE SLEEP APNEA 12/08/2006  . OSTEOARTHRITIS 12/08/2006  . RUQ PAIN 06/16/2008  . SHOULDER PAIN, RIGHT 02/07/2008  . Sleep apnea   . Swelling of limb 07/28/2008  . THYROID FUNCTION TEST, ABNORMAL 12/08/2006    Family History  Problem Relation Age of Onset  . Other Mother        blood clots  . Cervical cancer Mother   . Cancer Mother   . Heart disease Brother   . Diabetes Brother   . Heart disease Sister   . Diabetes Sister   . Heart disease Brother   . Diabetes Brother   . Heart disease Brother   . Diabetes Brother   . Stroke Sister   . Diabetes Sister   . Throat cancer Father   . Cancer Father   . Diabetes Other        all siblings, 4 brothers,  5 sisters    Social History   Socioeconomic History  . Marital status: Divorced    Spouse name: Not on file  . Number of children: Not on file  . Years of education: Not on file  . Highest education level: Not on file  Occupational History  . Occupation: retired    Fish farm manager: RETIRED  Social Needs  . Financial resource strain: Not on file  . Food insecurity:    Worry: Not on file    Inability: Not on file  . Transportation needs:    Medical: Not on file    Non-medical: Not on file  Tobacco Use  . Smoking status: Never Smoker  . Smokeless tobacco: Never Used  Substance and Sexual Activity  . Alcohol use: No    Alcohol/week: 0.0 standard drinks  . Drug use: No  . Sexual activity: Not on file  Lifestyle  . Physical activity:    Days per week: Not on file    Minutes per session: Not on file  . Stress: Not on file  Relationships  . Social connections:    Talks on phone: Not on file    Gets together:  Not on file    Attends religious service: Not on file    Active member of club or organization: Not on file    Attends meetings of clubs or organizations: Not on file    Relationship status: Not on file  Other Topics Concern  . Not on file  Social History Narrative   Master level education in math   Pt is currently retired   Pt is divorced with children   Recently had to move had a break in and thus away from her pool exercise    Outpatient Medications Prior to Visit  Medication Sig Dispense Refill  . amLODipine (NORVASC) 5 MG tablet TAKE 1 TABLET BY MOUTH EVERY DAY 90 tablet 0  . chlorthalidone (HYGROTON) 25 MG tablet TAKE 1 TABLET BY MOUTH EVERY DAY 90 tablet 0  . cholecalciferol (VITAMIN D) 1000 UNITS tablet Take 1,000 Units by mouth daily.    Marland Kitchen glucose blood (ACCU-CHEK AVIVA PLUS) test strip Use to check blood sugar daily E11.9 100 each 12  . Lancets (ACCU-CHEK SOFT TOUCH) lancets Use to test blood glucose twice daily 50 each 12  . levothyroxine (SYNTHROID,  LEVOTHROID) 75 MCG tablet TAKE 1 TABLET (75 MCG TOTAL) BY MOUTH DAILY BEFORE BREAKFAST. 90 tablet 0  . losartan (COZAAR) 100 MG tablet TAKE 1 TABLET (100 MG TOTAL) BY MOUTH DAILY. MAY BEGIN AT 50 MG PER DAY AND INCREASE TO 100 MG 90 tablet 1  . Misc Natural Products (OSTEO BI-FLEX ADV JOINT SHIELD PO) Take 1 tablet by mouth daily.    . Multiple Vitamins-Minerals (CENTRUM SILVER PO) Take 1 tablet by mouth daily.      . potassium chloride (K-DUR) 10 MEQ tablet TAKE 1 TABLET (10 MEQ TOTAL) BY MOUTH 3 (THREE) TIMES DAILY. 90 tablet 2   No facility-administered medications prior to visit.      EXAM:  BP 140/62 (BP Location: Left Arm, Patient Position: Sitting, Cuff Size: Large)   Pulse 86   Temp 98.7 F (37.1 C) (Oral)   Wt 287 lb 14.4 oz (130.6 kg)   BMI 54.40 kg/m   Body mass index is 54.4 kg/m.  GENERAL: vitals reviewed and listed above, alert, oriented, appears well hydrated and in no acute distress HEENT: atraumatic, conjunctiva  clear, no obvious abnormalities on inspection of external nose and ears NECK: no obvious masses on inspection palpation  LUNGS: clear to auscultation bilaterally, no wheezes, rales or rhonchi, good air movement CV: HRRR, no clubbing cyanosis or  1+  nl cap refill   1/6 sem lusb no radiation MS: moves all extremities without noticeable focal  abnormality PSYCH: pleasant and cooperative, no obvious depression or anxiety Lab Results  Component Value Date   WBC 5.1 08/04/2017   HGB 12.3 08/04/2017   HCT 36.1 08/04/2017   PLT 160.0 08/04/2017   GLUCOSE 149 (H) 08/04/2017   CHOL 124 06/09/2017   TRIG 87.0 06/09/2017   HDL 53.50 06/09/2017   LDLCALC 53 06/09/2017   ALT 11 06/09/2017   AST 10 06/09/2017   NA 139 08/04/2017   K 4.0 08/04/2017   CL 103 08/04/2017   CREATININE 1.04 08/04/2017   BUN 19 08/04/2017   CO2 29 08/04/2017   TSH 2.89 06/09/2017   HGBA1C 7.2 (A) 12/08/2017   MICROALBUR 0.4 08/02/2009   BP Readings from Last 3 Encounters:    12/08/17 140/62  08/04/17 (!) 115/50  06/09/17 (!) 128/54   Wt Readings from Last 3 Encounters:  12/08/17 287 lb 14.4 oz (  130.6 kg)  08/04/17 283 lb (128.4 kg)  06/09/17 287 lb 4.8 oz (130.3 kg)     ASSESSMENT AND PLAN:  Discussed the following assessment and plan:  Diabetes mellitus type 2 in obese ([place]) - Plan: POC HgB A1c  Morbid obesity ([place])  Essential hypertension  Medication management - Plan: POC HgB A1c  OSA on CPAP  Influenza vaccination declined by patient 4 mos cpx and labs    Total visit 59mins > 50% spent counseling and coordinating care as indicated in above note and in instructions to patient .  a1c is better   7.2      plan continue and reviewed goals  thinks can get her bp better  Also  Sees  dr Einar Gip yearly  -Patient advised to return or notify health care team  if  new concerns arise.  Patient Instructions  Continue attention  To lifestyle intervention healthy eating and exercise .  Get weight o go backj down  As this also helps  BP and  Swelling .   Glad your BP is better but want you to  Get this down to the 135 range .    cpx yearly exam and meds and labs at visit  In 4 months    Make sure dr Einar Gip sends [place] a copy of any visit  Notes when you see him.   Let us know if we need to do a referral;to  Do your yearly nutrition /dietary visit .   Hg a1c is better at 7.2        [place] K. Panosh M.D.

## 2017-12-08 NOTE — Patient Instructions (Addendum)
Continue attention  To lifestyle intervention healthy eating and exercise .  Get weight o go backj down  As this also helps  BP and  Swelling .   Glad your BP is better but want you to  Get this down to the 135 range .    cpx yearly exam and meds and labs at visit  In 4 months    Make sure dr Einar Gip sends [place] a copy of any visit  Notes when you see him.   Let us know if we need to do a referral;to  Do your yearly nutrition /dietary visit .   Hg a1c is better at 7.2

## 2017-12-17 ENCOUNTER — Other Ambulatory Visit: Payer: Self-pay | Admitting: Internal Medicine

## 2017-12-29 ENCOUNTER — Ambulatory Visit: Payer: Self-pay

## 2017-12-29 NOTE — Telephone Encounter (Signed)
Patient called in with c/o "abdominal pain." She says "the pain woke me up this morning at 0600 and when I sat up, it was worse, sharp at 7-8. Now the pain is dull at a 2-3. The pain is on the right side right at the front of my hip. It does radiate across the lower abdomen sometimes." I asked about what has made it better or worse, she says "having a bowel movement and urinating made it better." I asked about other symptoms, she denies. According to initial protocol before I edited it for UC, see PCP within 24 hours. Patient wanted to be seen in the office. No appointments with PCP. Unable to schedule patient for tomorrow with another provider due to Same Day slots on the schedule and I'm unable to schedule. The patient wanted a morning appointment. I called and spoke to [place], [facility] who says to send the request over and she will find someone to unblock the schedule for tomorrow at 1030 with Dr. Ethlyn Gallery. I advised the patient and she says "I will just go to the ED when I get dressed. I need to know what is going on." Care advice given, patient verbalized understanding.    Reason for Disposition . [1] MILD-MODERATE pain AND [2] constant AND [3] present > 2 hours  Answer Assessment - Initial Assessment Questions 1. LOCATION: "Where does it hurt?"      Lower right abdomen 2. RADIATION: "Does the pain shoot anywhere else?" (e.g., chest, back)     Just across the bottom of the abdomen 3. ONSET: "When did the pain begin?" (e.g., minutes, hours or days ago)      This morning around 0600, woke me up 4. SUDDEN: "Gradual or sudden onset?"     Sudden 5. PATTERN "Does the pain come and go, or is it constant?"    - If constant: "Is it getting better, staying the same, or worsening?"      (Note: Constant means the pain never goes away completely; most serious pain is constant and it progresses)     - If intermittent: "How long does it last?" "Do you have pain now?"     (Note: Intermittent means the pain goes  away completely between bouts)     Constant 6. SEVERITY: "How bad is the pain?"  (e.g., Scale 1-10; mild, moderate, or severe)   - MILD (1-3): doesn't interfere with normal activities, abdomen soft and not tender to touch    - MODERATE (4-7): interferes with normal activities or awakens from sleep, tender to touch    - SEVERE (8-10): excruciating pain, doubled over, unable to do any normal activities      3 right now, dull ache; it was a sharp pain 7-8 this morning 7. RECURRENT SYMPTOM: "Have you ever had this type of abdominal pain before?" If so, ask: "When was the last time?" and "What happened that time?"      No 8. CAUSE: "What do you think is causing the abdominal pain?"     I don't know 9. RELIEVING/AGGRAVATING FACTORS: "What makes it better or worse?" (e.g., movement, antacids, bowel movement)     Soft bowel movement made it better and urinating makes it better 10. OTHER SYMPTOMS: "Has there been any vomiting, diarrhea, constipation, or urine problems?"      No 11. PREGNANCY: "Is there any chance you are pregnant?" "When was your last menstrual period?"       No  Protocols used: ABDOMINAL PAIN - [facility]

## 2017-12-29 NOTE — Telephone Encounter (Signed)
Please Advise

## 2017-12-30 ENCOUNTER — Emergency Department (HOSPITAL_COMMUNITY): Payer: Medicare Other | Admitting: Anesthesiology

## 2017-12-30 ENCOUNTER — Other Ambulatory Visit: Payer: Self-pay

## 2017-12-30 ENCOUNTER — Emergency Department (HOSPITAL_COMMUNITY): Payer: Medicare Other

## 2017-12-30 ENCOUNTER — Encounter (HOSPITAL_COMMUNITY): Admission: EM | Disposition: A | Discharge status: Home Health | Payer: Self-pay | Source: Home / Self Care

## 2017-12-30 ENCOUNTER — Encounter (HOSPITAL_COMMUNITY): Payer: Self-pay | Admitting: Emergency Medicine

## 2017-12-30 ENCOUNTER — Inpatient Hospital Stay (HOSPITAL_COMMUNITY)
Admission: EM | Admit: 2017-12-30 | Discharge: 2018-01-10 | DRG: 853 | Disposition: A | Discharge status: Home Health | Payer: Medicare Other | Attending: General Surgery | Admitting: General Surgery

## 2017-12-30 DIAGNOSIS — I4891 Unspecified atrial fibrillation: Secondary | ICD-10-CM | POA: Diagnosis not present

## 2017-12-30 DIAGNOSIS — Z9049 Acquired absence of other specified parts of digestive tract: Secondary | ICD-10-CM | POA: Diagnosis not present

## 2017-12-30 DIAGNOSIS — K560 Paralytic ileus: Secondary | ICD-10-CM | POA: Diagnosis not present

## 2017-12-30 DIAGNOSIS — R0602 Shortness of breath: Secondary | ICD-10-CM

## 2017-12-30 DIAGNOSIS — A0472 Enterocolitis due to Clostridium difficile, not specified as recurrent: Secondary | ICD-10-CM | POA: Diagnosis not present

## 2017-12-30 DIAGNOSIS — I350 Nonrheumatic aortic (valve) stenosis: Secondary | ICD-10-CM | POA: Diagnosis not present

## 2017-12-30 DIAGNOSIS — I10 Essential (primary) hypertension: Secondary | ICD-10-CM | POA: Diagnosis present

## 2017-12-30 DIAGNOSIS — Z6841 Body Mass Index (BMI) 40.0 and over, adult: Secondary | ICD-10-CM

## 2017-12-30 DIAGNOSIS — E119 Type 2 diabetes mellitus without complications: Secondary | ICD-10-CM | POA: Diagnosis not present

## 2017-12-30 DIAGNOSIS — N179 Acute kidney failure, unspecified: Secondary | ICD-10-CM

## 2017-12-30 DIAGNOSIS — R52 Pain, unspecified: Secondary | ICD-10-CM | POA: Diagnosis not present

## 2017-12-30 DIAGNOSIS — I471 Supraventricular tachycardia: Secondary | ICD-10-CM | POA: Diagnosis not present

## 2017-12-30 DIAGNOSIS — I517 Cardiomegaly: Secondary | ICD-10-CM | POA: Diagnosis present

## 2017-12-30 DIAGNOSIS — E876 Hypokalemia: Secondary | ICD-10-CM | POA: Diagnosis present

## 2017-12-30 DIAGNOSIS — E872 Acidosis: Secondary | ICD-10-CM | POA: Diagnosis not present

## 2017-12-30 DIAGNOSIS — E785 Hyperlipidemia, unspecified: Secondary | ICD-10-CM | POA: Diagnosis present

## 2017-12-30 DIAGNOSIS — M109 Gout, unspecified: Secondary | ICD-10-CM | POA: Diagnosis not present

## 2017-12-30 DIAGNOSIS — K381 Appendicular concretions: Secondary | ICD-10-CM | POA: Diagnosis present

## 2017-12-30 DIAGNOSIS — I361 Nonrheumatic tricuspid (valve) insufficiency: Secondary | ICD-10-CM | POA: Diagnosis not present

## 2017-12-30 DIAGNOSIS — G4733 Obstructive sleep apnea (adult) (pediatric): Secondary | ICD-10-CM | POA: Diagnosis present

## 2017-12-30 DIAGNOSIS — K3532 Acute appendicitis with perforation and localized peritonitis, without abscess: Secondary | ICD-10-CM

## 2017-12-30 DIAGNOSIS — Z9089 Acquired absence of other organs: Secondary | ICD-10-CM | POA: Diagnosis not present

## 2017-12-30 DIAGNOSIS — E039 Hypothyroidism, unspecified: Secondary | ICD-10-CM | POA: Diagnosis not present

## 2017-12-30 DIAGNOSIS — R1084 Generalized abdominal pain: Secondary | ICD-10-CM | POA: Diagnosis not present

## 2017-12-30 DIAGNOSIS — I119 Hypertensive heart disease without heart failure: Secondary | ICD-10-CM | POA: Diagnosis not present

## 2017-12-30 DIAGNOSIS — R1031 Right lower quadrant pain: Secondary | ICD-10-CM | POA: Diagnosis not present

## 2017-12-30 DIAGNOSIS — S301XXD Contusion of abdominal wall, subsequent encounter: Secondary | ICD-10-CM | POA: Diagnosis not present

## 2017-12-30 DIAGNOSIS — R509 Fever, unspecified: Secondary | ICD-10-CM | POA: Diagnosis not present

## 2017-12-30 DIAGNOSIS — M25511 Pain in right shoulder: Secondary | ICD-10-CM | POA: Diagnosis not present

## 2017-12-30 DIAGNOSIS — R6520 Severe sepsis without septic shock: Secondary | ICD-10-CM | POA: Diagnosis not present

## 2017-12-30 DIAGNOSIS — R0989 Other specified symptoms and signs involving the circulatory and respiratory systems: Secondary | ICD-10-CM | POA: Diagnosis not present

## 2017-12-30 DIAGNOSIS — K3533 Acute appendicitis with perforation and localized peritonitis, with abscess: Secondary | ICD-10-CM

## 2017-12-30 DIAGNOSIS — E1165 Type 2 diabetes mellitus with hyperglycemia: Secondary | ICD-10-CM | POA: Diagnosis present

## 2017-12-30 DIAGNOSIS — R34 Anuria and oliguria: Secondary | ICD-10-CM | POA: Diagnosis not present

## 2017-12-30 DIAGNOSIS — Z79899 Other long term (current) drug therapy: Secondary | ICD-10-CM | POA: Diagnosis not present

## 2017-12-30 DIAGNOSIS — I248 Other forms of acute ischemic heart disease: Secondary | ICD-10-CM | POA: Diagnosis present

## 2017-12-30 DIAGNOSIS — I351 Nonrheumatic aortic (valve) insufficiency: Secondary | ICD-10-CM | POA: Diagnosis not present

## 2017-12-30 DIAGNOSIS — K37 Unspecified appendicitis: Secondary | ICD-10-CM | POA: Diagnosis not present

## 2017-12-30 DIAGNOSIS — E669 Obesity, unspecified: Secondary | ICD-10-CM | POA: Diagnosis not present

## 2017-12-30 DIAGNOSIS — Z48815 Encounter for surgical aftercare following surgery on the digestive system: Secondary | ICD-10-CM | POA: Diagnosis not present

## 2017-12-30 DIAGNOSIS — A419 Sepsis, unspecified organism: Secondary | ICD-10-CM | POA: Diagnosis present

## 2017-12-30 DIAGNOSIS — E1169 Type 2 diabetes mellitus with other specified complication: Secondary | ICD-10-CM | POA: Diagnosis not present

## 2017-12-30 DIAGNOSIS — R140 Abdominal distension (gaseous): Secondary | ICD-10-CM

## 2017-12-30 DIAGNOSIS — E782 Mixed hyperlipidemia: Secondary | ICD-10-CM | POA: Diagnosis present

## 2017-12-30 DIAGNOSIS — R000 Tachycardia, unspecified: Secondary | ICD-10-CM | POA: Diagnosis not present

## 2017-12-30 DIAGNOSIS — Z8739 Personal history of other diseases of the musculoskeletal system and connective tissue: Secondary | ICD-10-CM

## 2017-12-30 DIAGNOSIS — E8770 Fluid overload, unspecified: Secondary | ICD-10-CM | POA: Diagnosis present

## 2017-12-30 DIAGNOSIS — M1990 Unspecified osteoarthritis, unspecified site: Secondary | ICD-10-CM | POA: Diagnosis not present

## 2017-12-30 DIAGNOSIS — R0789 Other chest pain: Secondary | ICD-10-CM | POA: Diagnosis not present

## 2017-12-30 DIAGNOSIS — Z9989 Dependence on other enabling machines and devices: Secondary | ICD-10-CM

## 2017-12-30 DIAGNOSIS — R0902 Hypoxemia: Secondary | ICD-10-CM | POA: Diagnosis not present

## 2017-12-30 DIAGNOSIS — Z4801 Encounter for change or removal of surgical wound dressing: Secondary | ICD-10-CM | POA: Diagnosis not present

## 2017-12-30 DIAGNOSIS — Z9981 Dependence on supplemental oxygen: Secondary | ICD-10-CM | POA: Diagnosis not present

## 2017-12-30 DIAGNOSIS — I959 Hypotension, unspecified: Secondary | ICD-10-CM | POA: Diagnosis not present

## 2017-12-30 DIAGNOSIS — Z90711 Acquired absence of uterus with remaining cervical stump: Secondary | ICD-10-CM | POA: Diagnosis not present

## 2017-12-30 DIAGNOSIS — Z86010 Personal history of colonic polyps: Secondary | ICD-10-CM | POA: Diagnosis not present

## 2017-12-30 DIAGNOSIS — K3580 Unspecified acute appendicitis: Secondary | ICD-10-CM | POA: Diagnosis not present

## 2017-12-30 HISTORY — PX: LAPAROSCOPIC APPENDECTOMY: SHX408

## 2017-12-30 HISTORY — DX: Acute appendicitis with perforation, localized peritonitis, and gangrene, with abscess: K35.33

## 2017-12-30 LAB — CBC WITH DIFFERENTIAL/PLATELET
Abs Immature Granulocytes: 0.08 10*3/uL — ABNORMAL HIGH (ref 0.00–0.07)
Basophils Absolute: 0 10*3/uL (ref 0.0–0.1)
Basophils Relative: 0 %
EOS PCT: 0 %
Eosinophils Absolute: 0 10*3/uL (ref 0.0–0.5)
HEMATOCRIT: 46.9 % — AB (ref 36.0–46.0)
Hemoglobin: 15.1 g/dL — ABNORMAL HIGH (ref 12.0–15.0)
Immature Granulocytes: 1 %
LYMPHS PCT: 3 %
Lymphs Abs: 0.4 10*3/uL — ABNORMAL LOW (ref 0.7–4.0)
MCH: 30.1 pg (ref 26.0–34.0)
MCHC: 32.2 g/dL (ref 30.0–36.0)
MCV: 93.6 fL (ref 80.0–100.0)
Monocytes Absolute: 0.4 10*3/uL (ref 0.1–1.0)
Monocytes Relative: 3 %
Neutro Abs: 14.2 10*3/uL — ABNORMAL HIGH (ref 1.7–7.7)
Neutrophils Relative %: 93 %
Platelets: 147 10*3/uL — ABNORMAL LOW (ref 150–400)
RBC: 5.01 MIL/uL (ref 3.87–5.11)
RDW: 13.5 % (ref 11.5–15.5)
WBC: 15.1 10*3/uL — AB (ref 4.0–10.5)
nRBC: 0 % (ref 0.0–0.2)

## 2017-12-30 LAB — COMPREHENSIVE METABOLIC PANEL
ALT: 17 U/L (ref 0–44)
AST: 31 U/L (ref 15–41)
Albumin: 4 g/dL (ref 3.5–5.0)
Alkaline Phosphatase: 70 U/L (ref 38–126)
Anion gap: 15 (ref 5–15)
BUN: 31 mg/dL — ABNORMAL HIGH (ref 8–23)
CO2: 22 mmol/L (ref 22–32)
Calcium: 9.1 mg/dL (ref 8.9–10.3)
Chloride: 103 mmol/L (ref 98–111)
Creatinine, Ser: 1.58 mg/dL — ABNORMAL HIGH (ref 0.44–1.00)
GFR calc non Af Amer: 32 mL/min — ABNORMAL LOW (ref >60)
GFR, EST AFRICAN AMERICAN: 37 mL/min — AB (ref >60)
Glucose, Bld: 230 mg/dL — ABNORMAL HIGH (ref 70–99)
Potassium: 3.4 mmol/L — ABNORMAL LOW (ref 3.5–5.1)
Sodium: 140 mmol/L (ref 135–145)
TOTAL PROTEIN: 7.9 g/dL (ref 6.5–8.1)
Total Bilirubin: 1.7 mg/dL — ABNORMAL HIGH (ref 0.3–1.2)

## 2017-12-30 LAB — I-STAT CG4 LACTIC ACID, ED
Lactic Acid, Venous: 3.97 mmol/L (ref 0.5–1.9)
Lactic Acid, Venous: 2.95 mmol/L (ref 0.5–1.9)

## 2017-12-30 LAB — LIPASE, BLOOD: Lipase: 37 U/L (ref 11–51)

## 2017-12-30 SURGERY — APPENDECTOMY, LAPAROSCOPIC
Anesthesia: General

## 2017-12-30 MED ORDER — SODIUM CHLORIDE (PF) 0.9 % IJ SOLN
INTRAMUSCULAR | Status: AC
  Filled 2017-12-30: qty 50

## 2017-12-30 MED ORDER — FENTANYL CITRATE (PF) 100 MCG/2ML IJ SOLN
INTRAMUSCULAR | Status: AC
  Filled 2017-12-30: qty 2

## 2017-12-30 MED ORDER — PROMETHAZINE HCL 25 MG/ML IJ SOLN: 6.2500 mg | INTRAMUSCULAR | Status: DC | PRN

## 2017-12-30 MED ORDER — ONDANSETRON HCL 4 MG/2ML IJ SOLN
INTRAMUSCULAR | Status: AC
  Filled 2017-12-30: qty 2

## 2017-12-30 MED ORDER — PROPOFOL 10 MG/ML IV BOLUS
INTRAVENOUS | Status: AC
  Filled 2017-12-30: qty 20

## 2017-12-30 MED ORDER — IOHEXOL 300 MG/ML  SOLN
75.0000 mL | Freq: Once | INTRAMUSCULAR | Status: AC | PRN
  Administered 2017-12-30: 75 mL via INTRAVENOUS

## 2017-12-30 MED ORDER — IOPAMIDOL (ISOVUE-300) INJECTION 61%
INTRAVENOUS | Status: AC
  Filled 2017-12-30: qty 100

## 2017-12-30 MED ORDER — LIDOCAINE HCL (PF) 1 % IJ SOLN
INTRAMUSCULAR | Status: DC | PRN
  Administered 2017-12-30: 8.5 mL

## 2017-12-30 MED ORDER — SUCCINYLCHOLINE CHLORIDE 200 MG/10ML IV SOSY
PREFILLED_SYRINGE | INTRAVENOUS | Status: AC
  Filled 2017-12-30: qty 10

## 2017-12-30 MED ORDER — PHENYLEPHRINE 40 MCG/ML (10ML) SYRINGE FOR IV PUSH (FOR BLOOD PRESSURE SUPPORT)
PREFILLED_SYRINGE | INTRAVENOUS | Status: DC | PRN
  Administered 2017-12-30 (×3): 80 ug via INTRAVENOUS

## 2017-12-30 MED ORDER — DEXAMETHASONE SODIUM PHOSPHATE 10 MG/ML IJ SOLN
INTRAMUSCULAR | Status: AC
  Filled 2017-12-30: qty 1

## 2017-12-30 MED ORDER — SODIUM CHLORIDE 0.9 % IV BOLUS
1000.0000 mL | Freq: Once | INTRAVENOUS | Status: AC
  Administered 2017-12-30: 1000 mL via INTRAVENOUS

## 2017-12-30 MED ORDER — PROPOFOL 10 MG/ML IV BOLUS
INTRAVENOUS | Status: DC | PRN
  Administered 2017-12-30: 150 mg via INTRAVENOUS

## 2017-12-30 MED ORDER — HYDROMORPHONE HCL 1 MG/ML IJ SOLN: 0.2500 mg | INTRAMUSCULAR | Status: DC | PRN

## 2017-12-30 MED ORDER — SUGAMMADEX SODIUM 200 MG/2ML IV SOLN
INTRAVENOUS | Status: DC | PRN
  Administered 2017-12-30: 400 mg via INTRAVENOUS

## 2017-12-30 MED ORDER — METRONIDAZOLE IN NACL 5-0.79 MG/ML-% IV SOLN
500.0000 mg | Freq: Three times a day (TID) | INTRAVENOUS | Status: DC
  Administered 2017-12-30 (×2): 500 mg via INTRAVENOUS
  Filled 2017-12-30 (×2): qty 100

## 2017-12-30 MED ORDER — LIDOCAINE 2% (20 MG/ML) 5 ML SYRINGE
INTRAMUSCULAR | Status: AC
  Filled 2017-12-30: qty 5

## 2017-12-30 MED ORDER — LIDOCAINE 2% (20 MG/ML) 5 ML SYRINGE
INTRAMUSCULAR | Status: DC | PRN
  Administered 2017-12-30: 50 mg via INTRAVENOUS

## 2017-12-30 MED ORDER — DILTIAZEM HCL-DEXTROSE 100-5 MG/100ML-% IV SOLN (PREMIX)
5.0000 mg/h | INTRAVENOUS | Status: DC
  Administered 2017-12-30: 5 mg/h via INTRAVENOUS
  Filled 2017-12-30: qty 100

## 2017-12-30 MED ORDER — BUPIVACAINE-EPINEPHRINE 0.25% -1:200000 IJ SOLN
INTRAMUSCULAR | Status: DC | PRN
  Administered 2017-12-30: 8.5 mL

## 2017-12-30 MED ORDER — SUGAMMADEX SODIUM 500 MG/5ML IV SOLN
INTRAVENOUS | Status: AC
  Filled 2017-12-30: qty 5

## 2017-12-30 MED ORDER — KETOROLAC TROMETHAMINE 30 MG/ML IJ SOLN: 30.0000 mg | Freq: Once | INTRAMUSCULAR | Status: AC | PRN

## 2017-12-30 MED ORDER — ROCURONIUM BROMIDE 10 MG/ML (PF) SYRINGE
PREFILLED_SYRINGE | INTRAVENOUS | Status: DC | PRN
  Administered 2017-12-30: 40 mg via INTRAVENOUS

## 2017-12-30 MED ORDER — SODIUM CHLORIDE 0.9 % IV SOLN
INTRAVENOUS | Status: DC | PRN
  Administered 2017-12-30: 50 ug/min via INTRAVENOUS

## 2017-12-30 MED ORDER — LACTATED RINGERS IV SOLN
INTRAVENOUS | Status: DC | PRN
  Administered 2017-12-30 (×2): via INTRAVENOUS

## 2017-12-30 MED ORDER — HYDROMORPHONE HCL 1 MG/ML IJ SOLN
0.5000 mg | Freq: Once | INTRAMUSCULAR | Status: AC
  Administered 2017-12-30: 0.5 mg via INTRAVENOUS
  Filled 2017-12-30: qty 1

## 2017-12-30 MED ORDER — EPHEDRINE 5 MG/ML INJ
INTRAVENOUS | Status: AC
  Filled 2017-12-30: qty 10

## 2017-12-30 MED ORDER — ACETAMINOPHEN 325 MG PO TABS
650.0000 mg | ORAL_TABLET | Freq: Once | ORAL | Status: AC | PRN
  Administered 2017-12-30: 650 mg via ORAL
  Filled 2017-12-30: qty 2

## 2017-12-30 MED ORDER — SUCCINYLCHOLINE CHLORIDE 200 MG/10ML IV SOSY
PREFILLED_SYRINGE | INTRAVENOUS | Status: DC | PRN
  Administered 2017-12-30: 140 mg via INTRAVENOUS

## 2017-12-30 MED ORDER — ACETAMINOPHEN 650 MG RE SUPP
650.0000 mg | Freq: Once | RECTAL | Status: AC
  Administered 2017-12-30: 650 mg via RECTAL
  Filled 2017-12-30: qty 1

## 2017-12-30 MED ORDER — BUPIVACAINE-EPINEPHRINE (PF) 0.25% -1:200000 IJ SOLN
INTRAMUSCULAR | Status: AC
  Filled 2017-12-30: qty 30

## 2017-12-30 MED ORDER — IOPAMIDOL (ISOVUE-300) INJECTION 61%: 100.0000 mL | Freq: Once | INTRAVENOUS | Status: DC | PRN

## 2017-12-30 MED ORDER — DEXAMETHASONE SODIUM PHOSPHATE 4 MG/ML IJ SOLN
INTRAMUSCULAR | Status: DC | PRN
  Administered 2017-12-30: 4 mg via INTRAVENOUS

## 2017-12-30 MED ORDER — SODIUM CHLORIDE 0.9 % IV SOLN
2.0000 g | Freq: Once | INTRAVENOUS | Status: AC
  Administered 2017-12-30: 2 g via INTRAVENOUS
  Filled 2017-12-30: qty 2

## 2017-12-30 MED ORDER — MEPERIDINE HCL 50 MG/ML IJ SOLN: 6.2500 mg | INTRAMUSCULAR | Status: DC | PRN

## 2017-12-30 MED ORDER — DILTIAZEM LOAD VIA INFUSION
15.0000 mg | Freq: Once | INTRAVENOUS | Status: AC
  Administered 2017-12-30: 15 mg via INTRAVENOUS
  Filled 2017-12-30: qty 15

## 2017-12-30 MED ORDER — ONDANSETRON HCL 4 MG/2ML IJ SOLN
INTRAMUSCULAR | Status: DC | PRN
  Administered 2017-12-30: 4 mg via INTRAVENOUS

## 2017-12-30 MED ORDER — EPHEDRINE SULFATE-NACL 50-0.9 MG/10ML-% IV SOSY
PREFILLED_SYRINGE | INTRAVENOUS | Status: DC | PRN
  Administered 2017-12-30: 5 mg via INTRAVENOUS

## 2017-12-30 MED ORDER — LIDOCAINE HCL (PF) 1 % IJ SOLN
INTRAMUSCULAR | Status: AC
  Filled 2017-12-30: qty 30

## 2017-12-30 MED ORDER — SODIUM CHLORIDE 0.9 % IV SOLN
1000.0000 mL | INTRAVENOUS | Status: DC
  Administered 2017-12-30 – 2017-12-31 (×2): 1000 mL via INTRAVENOUS

## 2017-12-30 MED ORDER — ROCURONIUM BROMIDE 100 MG/10ML IV SOLN
INTRAVENOUS | Status: AC
  Filled 2017-12-30: qty 1

## 2017-12-30 MED ORDER — PHENYLEPHRINE HCL-NACL 10-0.9 MG/250ML-% IV SOLN
INTRAVENOUS | Status: AC
  Filled 2017-12-30: qty 250

## 2017-12-30 MED ORDER — FENTANYL CITRATE (PF) 100 MCG/2ML IJ SOLN
INTRAMUSCULAR | Status: DC | PRN
  Administered 2017-12-30 (×4): 50 ug via INTRAVENOUS

## 2017-12-30 SURGICAL SUPPLY — 40 items
ADH SKN CLS APL DERMABOND .7 (GAUZE/BANDAGES/DRESSINGS) ×1
APPLIER CLIP ROT 10 11.4 M/L (STAPLE)
APR CLP MED LRG 11.4X10 (STAPLE)
BAG SPEC RTRVL LRG 6X4 10 (ENDOMECHANICALS) ×1
CABLE HIGH FREQUENCY MONO STRZ (ELECTRODE) ×2 IMPLANT
CLIP APPLIE ROT 10 11.4 M/L (STAPLE) IMPLANT
COVER SURGICAL LIGHT HANDLE (MISCELLANEOUS) ×2 IMPLANT
COVER WAND RF STERILE (DRAPES) ×1 IMPLANT
CUTTER FLEX LINEAR 45M (STAPLE) ×1 IMPLANT
DECANTER SPIKE VIAL GLASS SM (MISCELLANEOUS) ×2 IMPLANT
DERMABOND ADVANCED (GAUZE/BANDAGES/DRESSINGS) ×1
DERMABOND ADVANCED .7 DNX12 (GAUZE/BANDAGES/DRESSINGS) ×1 IMPLANT
DRAPE LAPAROSCOPIC ABDOMINAL (DRAPES) ×2 IMPLANT
ELECT REM PT RETURN 15FT ADLT (MISCELLANEOUS) ×2 IMPLANT
ENDOLOOP SUT PDS II  0 18 (SUTURE)
ENDOLOOP SUT PDS II 0 18 (SUTURE) IMPLANT
GLOVE BIO SURGEON STRL SZ 6 (GLOVE) ×7 IMPLANT
GLOVE BIOGEL PI IND STRL 7.0 (GLOVE) IMPLANT
GLOVE BIOGEL PI INDICATOR 7.0 (GLOVE) ×1
GLOVE INDICATOR 6.5 STRL GRN (GLOVE) ×2 IMPLANT
GOWN STRL REUS W/TWL 2XL LVL3 (GOWN DISPOSABLE) ×2 IMPLANT
GOWN STRL REUS W/TWL XL LVL3 (GOWN DISPOSABLE) ×4 IMPLANT
KIT BASIN OR (CUSTOM PROCEDURE TRAY) ×2 IMPLANT
POUCH SPECIMEN RETRIEVAL 10MM (ENDOMECHANICALS) ×2 IMPLANT
RELOAD 45 VASCULAR/THIN (ENDOMECHANICALS) ×2 IMPLANT
RELOAD STAPLE 45 2.5 WHT GRN (ENDOMECHANICALS) IMPLANT
RELOAD STAPLE 45 3.5 BLU ETS (ENDOMECHANICALS) IMPLANT
RELOAD STAPLE TA45 3.5 REG BLU (ENDOMECHANICALS) ×2 IMPLANT
SCISSORS LAP 5X35 DISP (ENDOMECHANICALS) ×2 IMPLANT
SET IRRIG TUBING LAPAROSCOPIC (IRRIGATION / IRRIGATOR) ×2 IMPLANT
SHEARS HARMONIC ACE PLUS 36CM (ENDOMECHANICALS) ×2 IMPLANT
SLEEVE XCEL OPT CAN 5 100 (ENDOMECHANICALS) ×3 IMPLANT
SUT MNCRL AB 4-0 PS2 18 (SUTURE) ×2 IMPLANT
TOWEL OR 17X26 10 PK STRL BLUE (TOWEL DISPOSABLE) ×2 IMPLANT
TRAY FOLEY CATH 14FRSI W/METER (CATHETERS) ×1 IMPLANT
TRAY FOLEY MTR SLVR 16FR STAT (SET/KITS/TRAYS/PACK) IMPLANT
TRAY LAPAROSCOPIC (CUSTOM PROCEDURE TRAY) ×2 IMPLANT
TROCAR BLADELESS OPT 5 100 (ENDOMECHANICALS) ×2 IMPLANT
TROCAR XCEL BLUNT TIP 100MML (ENDOMECHANICALS) ×2 IMPLANT
TUBING INSUF HEATED (TUBING) ×2 IMPLANT

## 2017-12-30 NOTE — ED Triage Notes (Signed)
Per [facility] pt from [place] for right side abd pains and fever that started yesterday. CBg 276 doesn't take medications for her diabetes bc can control with out it.

## 2017-12-30 NOTE — ED Provider Notes (Signed)
[place] DEPT Provider Note   CSN: [id number] Arrival date & time: 12/30/17  1008     History   Chief Complaint Chief Complaint  Patient presents with  . Fever  . Abdominal Pain    HPI Rebecca Rich is a 72 y.o. female.  Patient sent in from [place] clinic.  By EMS for right-sided abdominal pain and fever started yesterday morning at 6 AM.  Patient's blood sugar there was noted to be 276.  Patient states the right lower quadrant abdominal pain is been constant.  Associated with nausea and fever and chills.  No vomiting.  No diarrhea.  No prior history of similar pain.     Past Medical History:  Diagnosis Date  . Acute bronchospasm 08/24/2009  . Acute sphenoidal sinusitis 02/26/2010   Qualifier: Diagnosis of  By: Regis Bill MD, Standley Brooking   . CARDIAC MURMUR 12/08/2006  . COLONIC POLYPS, HX OF 12/08/2006  . DIABETES MELLITUS, TYPE II 12/08/2006  . Gallbladder/common duct stone, without infection, with obstruction 03/01/2010   removed ercp  . HYPERLIPIDEMIA 12/08/2006  . HYPERTENSION 12/08/2006  . 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 INFECTION, SKIN AND SOFT TISSUE 07/28/2008  . 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 LIVER FUNCTION TESTS, ABNORMAL 07/28/2008  . Morbid obesity ([place]) 12/08/2006  . OBESITY 09/24/2009  . OBSTRUCTIVE SLEEP APNEA 12/08/2006  . OSTEOARTHRITIS 12/08/2006  . RUQ PAIN 06/16/2008  . SHOULDER PAIN, RIGHT 02/07/2008  . Sleep apnea   . Swelling of limb 07/28/2008  . THYROID FUNCTION TEST, ABNORMAL 12/08/2006    Patient Active Problem List   Diagnosis Date Noted  . Acute lower GI bleeding 02/16/2017  . Diverticulosis of intestine with bleeding   . Rectal bleeding   . Hx of gout 08/21/2015  . Mild aortic stenosis 08/21/2015  . Elevated blood pressure 10/31/2014  . Type 2 diabetes mellitus without complication ([place]) 93/71/6967  . Muscle spasm 03/22/2013  . Skin lesion of back 09/13/2012  . Left shoulder pain 01/20/2012  . Hypothyroid 05/31/2011    . Elevated uric acid in blood 05/31/2011  . Palmar nodule 03/25/2011  . Abnormal urine 02/20/2011  . 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 OBESITY 09/24/2009  . 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 SHOULDER PAIN, RIGHT 02/07/2008  . Diabetes mellitus, type 2 ([place]) 12/08/2006  . HYPERLIPIDEMIA 12/08/2006  . Morbid obesity ([place]) 12/08/2006  . OSA on CPAP 12/08/2006  . Essential hypertension 12/08/2006  . OSTEOARTHRITIS 12/08/2006  . THYROID FUNCTION TEST, ABNORMAL 12/08/2006  . COLONIC POLYPS, HX OF 12/08/2006    Past Surgical History:  Procedure Laterality Date  . ABDOMINAL HYSTERECTOMY    . CHOLECYSTECTOMY  06/27/2008  . COLONOSCOPY N/A 12/13/2012   Procedure: COLONOSCOPY;  Surgeon: Juanita Craver, MD;  Location: [facility];  Service: Endoscopy;  Laterality: N/A;  . COLONOSCOPY Left 02/17/2017   Procedure: COLONOSCOPY;  Surgeon: Carol Ada, MD;  Location: [facility];  Service: Endoscopy;  Laterality: Left;  . common duct stone     ERCP      OB History   None      Home Medications    Prior to Admission medications   Medication Sig Start Date End Date Taking? Authorizing Provider  amLODipine (NORVASC) 5 MG tablet TAKE 1 TABLET BY MOUTH EVERY DAY Patient taking differently: Take 5 mg by mouth daily.  10/28/17  Yes Panosh, Standley Brooking, MD  chlorthalidone (HYGROTON) 25 MG tablet TAKE 1 TABLET BY MOUTH EVERY DAY Patient taking differently: Take 25 mg by mouth daily.  10/21/17  Yes Panosh, Standley Brooking, MD  cholecalciferol (VITAMIN D) 1000 UNITS tablet Take 1,000 Units by mouth daily.   Yes [provider]  levothyroxine (SYNTHROID, LEVOTHROID) 75 MCG tablet TAKE 1 TABLET (75 MCG TOTAL) BY MOUTH DAILY BEFORE BREAKFAST. 12/17/17  Yes Panosh, Standley Brooking, MD  losartan (COZAAR) 100 MG tablet TAKE 1 TABLET (100 MG TOTAL) BY MOUTH DAILY. MAY BEGIN AT 50 MG PER DAY AND INCREASE TO 100 MG Patient taking differently: Take 100 mg by mouth daily.  10/21/17  Yes Panosh, Standley Brooking, MD  Misc Natural Products  (OSTEO BI-FLEX ADV JOINT SHIELD PO) Take 1 tablet by mouth daily.   Yes [provider]  Multiple Vitamins-Minerals (CENTRUM SILVER PO) Take 1 tablet by mouth daily.     Yes [provider]  potassium chloride (K-DUR) 10 MEQ tablet TAKE 1 TABLET (10 MEQ TOTAL) BY MOUTH 3 (THREE) TIMES DAILY. 10/22/17  Yes Panosh, Standley Brooking, MD  glucose blood (ACCU-CHEK AVIVA PLUS) test strip Use to check blood sugar daily E11.9 08/26/16   Panosh, Standley Brooking, MD  Lancets (ACCU-CHEK SOFT TOUCH) lancets Use to test blood glucose twice daily 08/26/16   Panosh, Standley Brooking, MD    Family History Family History  Problem Relation Age of Onset  . Other Mother        blood clots  . Cervical cancer Mother   . Cancer Mother   . Heart disease Brother   . Diabetes Brother   . Heart disease Sister   . Diabetes Sister   . Heart disease Brother   . Diabetes Brother   . Heart disease Brother   . Diabetes Brother   . Stroke Sister   . Diabetes Sister   . Throat cancer Father   . Cancer Father   . Diabetes Other        all siblings, 4 brothers, 5 sisters    Social History Social History   Tobacco Use  . Smoking status: Never Smoker  . Smokeless tobacco: Never Used  Substance Use Topics  . Alcohol use: No    Alcohol/week: 0.0 standard drinks  . Drug use: No     Allergies   Patient has no known allergies.   Review of Systems Review of Systems  Constitutional: Positive for fever.  HENT: Negative for congestion.   Eyes: Negative for redness.  Respiratory: Negative for shortness of breath.   Cardiovascular: Negative for chest pain.  Gastrointestinal: Positive for abdominal pain and nausea. Negative for diarrhea and vomiting.  Genitourinary: Negative for dysuria.  Musculoskeletal: Negative for back pain.  Skin: Negative for rash.  Neurological: Negative for headaches.  Hematological: Does not bruise/bleed easily.  Psychiatric/Behavioral: Negative for confusion.     Physical Exam Updated  Vital Signs BP (!) 134/55   Pulse 93   Temp (!) 102.9 F (39.4 C) (Oral)   Resp (!) 53   Ht 1.549 m (5\' 1" )   Wt 126.6 kg   SpO2 96%   BMI 52.72 kg/m   Physical Exam  Constitutional: She is oriented to person, place, and time. She appears well-developed and well-nourished. No distress.  HENT:  Head: Normocephalic and atraumatic.  Mouth/Throat: Oropharynx is clear and moist.  Eyes: Pupils are equal, round, and reactive to light. Conjunctivae and EOM are normal.  Neck: Neck supple.  Cardiovascular: Regular rhythm.  Tachycardic  Pulmonary/Chest: Effort normal and breath sounds normal.  Abdominal: Soft. Bowel sounds are normal. There is tenderness.  Tender right lower quadrant  Musculoskeletal: Normal  range of motion. She exhibits no edema.  Neurological: She is alert and oriented to person, place, and time. No cranial nerve deficit or sensory deficit. She exhibits normal muscle tone. Coordination normal.  Skin: Skin is warm. No rash noted.  Nursing note and vitals reviewed.    ED Treatments / Results  Labs (all labs ordered are listed, but only abnormal results are displayed) Labs Reviewed  COMPREHENSIVE METABOLIC PANEL - Abnormal; Notable for the following components:      Result Value   Potassium 3.4 (*)    Glucose, Bld 230 (*)    BUN 31 (*)    Creatinine, Ser 1.58 (*)    Total Bilirubin 1.7 (*)    GFR calc non Af Amer 32 (*)    GFR calc Af Amer 37 (*)    All other components within normal limits  CBC WITH DIFFERENTIAL/PLATELET - Abnormal; Notable for the following components:   WBC 15.1 (*)    Hemoglobin 15.1 (*)    HCT 46.9 (*)    Platelets 147 (*)    Neutro Abs 14.2 (*)    Lymphs Abs 0.4 (*)    Abs Immature Granulocytes 0.08 (*)    All other components within normal limits  I-STAT CG4 LACTIC ACID, ED - Abnormal; Notable for the following components:   Lactic Acid, Venous 3.97 (*)    All other components within normal limits  I-STAT CG4 LACTIC ACID, ED -  Abnormal; Notable for the following components:   Lactic Acid, Venous 2.95 (*)    All other components within normal limits  CULTURE, BLOOD (ROUTINE X 2)  CULTURE, BLOOD (ROUTINE X 2)  URINE CULTURE  LIPASE, BLOOD  URINALYSIS, ROUTINE W REFLEX MICROSCOPIC    EKG EKG Interpretation  Date/Time:  Wednesday December 30 2017 10:49:22 EST Ventricular Rate:  112 PR Interval:    QRS Duration: 89 QT Interval:  332 QTC Calculation: 454 R Axis:   49 Text Interpretation:  Sinus tachycardia Nonspecific ST depression Confirmed by Fredia Sorrow [phone]) on 12/30/2017 10:58:36 AM   Radiology Dg Chest 2 View  Result Date: 12/30/2017 CLINICAL DATA:  Right-sided abdominal pain and fever which started yesterday. EXAM: CHEST - 2 VIEW COMPARISON:  10/31/2014 FINDINGS: The cardiac silhouette is enlarged. Mediastinal contours appear intact. Calcific atherosclerotic disease and tortuosity of the aorta. There is no evidence of focal airspace consolidation, pleural effusion or pneumothorax. Low lung volumes with exaggerated interstitial markings. Osseous structures are without acute abnormality. Soft tissues are grossly normal. IMPRESSION: Enlarged cardiac silhouette. Calcific atherosclerotic disease and tortuosity of the aorta. No evidence of focal airspace consolidation. Electronically Signed   By: Fidela Salisbury M.D.   On: 12/30/2017 12:21    Procedures Procedures (including critical care time)  CRITICAL CARE Performed by: Fredia Sorrow Total critical care time: 30 minutes Critical care time was exclusive of separately billable procedures and treating other patients. Critical care was necessary to treat or prevent imminent or life-threatening deterioration. Critical care was time spent personally by me on the following activities: development of treatment plan with patient and/or surrogate as well as nursing, discussions with consultants, evaluation of patient's response to treatment,  examination of patient, obtaining history from patient or surrogate, ordering and performing treatments and interventions, ordering and review of laboratory studies, ordering and review of radiographic studies, pulse oximetry and re-evaluation of patient's condition.   Medications Ordered in ED Medications  0.9 %  sodium chloride infusion (1,000 mLs Intravenous New Bag/Given 12/30/17 1252)  metroNIDAZOLE (FLAGYL)  IVPB 500 mg (0 mg Intravenous Stopped 12/30/17 1522)  diltiazem (CARDIZEM) 1 mg/mL load via infusion 15 mg (15 mg Intravenous Bolus from Bag 12/30/17 1400)    And  diltiazem (CARDIZEM) 100 mg in dextrose 5% 161mL (1 mg/mL) infusion (5 mg/hr Intravenous New Bag/Given 12/30/17 1355)  sodium chloride (PF) 0.9 % injection (has no administration in time range)  acetaminophen (TYLENOL) tablet 650 mg (650 mg Oral Given 12/30/17 1034)  ceFEPIme (MAXIPIME) 2 g in sodium chloride 0.9 % 100 mL IVPB (0 g Intravenous Stopped 12/30/17 1342)  sodium chloride 0.9 % bolus 1,000 mL (0 mLs Intravenous Stopped 12/30/17 1435)     Initial Impression / Assessment and Plan / ED Course  I have reviewed the triage vital signs and the nursing notes.  Pertinent labs & imaging results that were available during my care of the patient were reviewed by me and considered in my medical decision making (see chart for details).     Patient's vital signs meet sepsis initiation criteria.  Patient initiated on sepsis protocol.  Not hypotensive does not warrant 30 cc/kg fluid challenge based on that we will see with the lactic acid is.  Clinically suspicious for appendicitis.  Will initiate broad-spectrum antibiotics for serious intra-abdominal infection based on her vital signs.  Patient will get CT scan of her abdomen to further evaluate.  Patient with delay in getting the CT scan of her abdomen done for unknown this.  Patient remained stable except for she developed a supraventricular tachycardia or atrial fibrillation  type pattern with heart rate around 170.  Blood pressure was stable with this in the 710 systolic.  Patient still with persistent right lower quadrant pain.  For the advanced heart rate patient received diltiazem bolus and drip which brought her heart rate down into the 90s.  Patient also received 1 L of fluid bolus.  Patient's initial lactic acid was just shy of 4.  Repeat though was less than 3.  Patient needs CT scan of abdomen to determine what the intra-abdominal processes.  Patient obviously treated with broad-spectrum antibiotics for an intra-abdominal infection.  Chest x-ray negative.  Patient will require admission.  May require general surgery to be involved.  Patient turned over to Dr. Vanita Panda who will follow up the CT scan of the abdomen.  Patient offered fentanyl for pain and she did not want any pain medicine.  Final Clinical Impressions(s) / ED Diagnoses   Final diagnoses:  Right lower quadrant abdominal pain  Sepsis with acute organ dysfunction, due to unspecified organism, unspecified type, unspecified whether septic shock present [facility])    ED Discharge Orders    None       Fredia Sorrow, MD 12/30/17 1549

## 2017-12-30 NOTE — ED Notes (Signed)
Dr Rogene Houston notified of patient's lactic acid result of 3.97.

## 2017-12-30 NOTE — ED Notes (Signed)
Pt transported to the OR. 

## 2017-12-30 NOTE — ED Notes (Signed)
IV team nurse in room.

## 2017-12-30 NOTE — Telephone Encounter (Signed)
Patient did go to ED but not until this am   and is under evaluation for acute  abd pain  . Please follow

## 2017-12-30 NOTE — Op Note (Signed)
Laparoscopic appendectomy   Indications: The patient presented with a history of right-sided abdominal pain. A CT revealed findings consistent with acute appendicitis and concerns for perforation.  .  Pre-operative Diagnosis: acute appendicitis with appendicolith, perforation and localized peritonitis  Post-operative Diagnosis: perforated appendicitis with localized abscess  Surgeon: Stark Klein   Assistants: n/a  Anesthesia: General endotracheal anesthesia and Local anesthesia 1% plain lidocaine, 0.25.% bupivacaine, with epinephrine  ASA Class: 3, E  Procedure Details  The patient was seen again in the Holding Room. The risks, benefits, complications, treatment options, and expected outcomes were discussed with the patient and/or family. The possibilities of perforation of viscus, bleeding, recurrent infection, the need for additional procedures, failure to diagnose a condition, and creating a complication requiring transfusion or operation were discussed. There was concurrence with the proposed plan and informed consent was obtained. The site of surgery was properly noted. The patient was taken to Operating Room, identified as Rebecca Rich and the procedure verified as Appendectomy. A Time Out was held and the above information confirmed.  The patient was placed in the supine position and general anesthesia was induced, along with placement of orogastric tube, Venodyne boots, and a Foley catheter. The abdomen was prepped and draped in a sterile fashion. Local anesthetic was infiltrated in the infraumbilical region.  A 1.5 cm vertical incision was made just above the umbilicus.  The Kelly clamp was used to spread the subcutaneous tissues.  The fascia was elevated with 2 Kocher clamps and incised with the #11 blade.  A Claiborne Billings was used to confirm entrance into the peritoneal cavity.  A pursestring suture was placed around the fascial incision.  The Hasson trocar was inserted into the abdomen and  held in place with the tails of the suture.  The pneumoperitoneum was then established to steady pressure of 15 mmHg.     The pelvis and RLQ had significant adhesions to the abdominal wall.  The LUQ was much clearer.  A 5 mm trocar was placed in the left upper quadrant and one in the left mid abdomen.  The harmonic was used to take down some omentum from the abdominal wall.  Sharp dissection was used to take down a few loops of small bowel from the pelvic abdominal wall.  A third 5 mm trocar was placed in the left lower quadrant.     The small intestines were stuck in the pelvis.  Immediately visible was purulent drainage and fibrinous rind on the small intestine.  This was slowly and carefully dissected away from the RLQ.  Sharp and blunt dissection was used where appropriate.  The patient was found to have an enlarged and inflamed appendix that was stuck behind the small intestine.  The harmonic was used to take down the mesoappendix.  The appendix was perforated around 1.5 cm from the cecum.  The appendix was friable and came apart with retraction.    The appendix was divided at its base using an endo-GIA stapler. Minimal appendiceal stump was left in place. The multiple pieces of the appendix as well as the appendicolith were removed from the abdomen with an Endocatch bag through the umbilical port.  There was no evidence of bleeding, leakage, or complication after division of the appendix. Irrigation was also performed and irrigate suctioned from the abdomen as well.  Because of the amount of purulent drainage present, a 19 Fr blake drain was placed in the RLQ and pulled from the left mid abdominal port.  The remaining 5 mm trocars were removed.  The pneumoperitoneum was evacuated from the abdomen.  The pursestring suture was tied down around the umbilical fascial incision.  There was residual palpable fascial defect, so an additional 0-0 vicryl pursestring suture was placed.    The trocar site skin  wounds were closed with 4-0 Monocryl and dressed with Dermabond.  Instrument, sponge, and needle counts were correct at the conclusion of the case.   Findings: The appendix was found to be inflamed. There were signs of necrosis.  There was perforation. There was abscess formation.  Estimated Blood Loss:  Minimal         Drains: 19 Fr black              Specimens: appendix to pathology         Complications:  None; patient tolerated the procedure well.         Disposition: PACU - hemodynamically stable.         Condition: stable

## 2017-12-30 NOTE — Transfer of Care (Signed)
Immediate Anesthesia Transfer of Care Note  Patient: Rebecca Rich  Procedure(s) Performed: APPENDECTOMY LAPAROSCOPIC (N/A )  Patient Location: PACU  Anesthesia Type:General  Level of Consciousness: awake and patient cooperative  Airway & Oxygen Therapy: Patient Spontanous Breathing and Patient connected to face mask  Post-op Assessment: Report given to RN and Post -op Vital signs reviewed and stable  Post vital signs: Reviewed and stable  Last Vitals:  Vitals Value Taken Time  BP    Temp    Pulse    Resp    SpO2      Last Pain:  Vitals:   12/30/17 2136  TempSrc:   PainSc: 5          Complications: No apparent anesthesia complications

## 2017-12-30 NOTE — Telephone Encounter (Signed)
Pt still in ED

## 2017-12-30 NOTE — Progress Notes (Signed)
A consult was received from an ED physician for cefepime per pharmacy dosing.  The patient's profile has been reviewed for ht/wt/allergies/indication/available labs.   A one time order has been placed for cefepime 2 g IV once per MD.  Further antibiotics/pharmacy consults should be ordered by admitting physician if indicated.                       Thank you, Napoleon Form 12/30/2017  10:53 AM

## 2017-12-30 NOTE — ED Notes (Signed)
Pt HR increasing into the 180's Dr. Rogene Houston has been mad aware. EKG done.

## 2017-12-30 NOTE — Anesthesia Procedure Notes (Signed)
Procedure Name: Intubation Date/Time: 12/30/2017 10:09 PM Performed by: Claudia Desanctis, CRNA Pre-anesthesia Checklist: Patient identified, Emergency Drugs available, Suction available and Patient being monitored Patient Re-evaluated:Patient Re-evaluated prior to induction Oxygen Delivery Method: Circle system utilized Preoxygenation: Pre-oxygenation with 100% oxygen Induction Type: IV induction, Rapid sequence and Cricoid Pressure applied Ventilation: Mask ventilation without difficulty Laryngoscope Size: 2 and Miller Grade View: Grade I Tube type: Oral Tube size: 7.0 mm Number of attempts: 1 Airway Equipment and Method: Stylet Placement Confirmation: ETT inserted through vocal cords under direct vision,  positive ETCO2 and breath sounds checked- equal and bilateral Secured at: 21 cm Tube secured with: Tape Dental Injury: Teeth and Oropharynx as per pre-operative assessment

## 2017-12-30 NOTE — Anesthesia Preprocedure Evaluation (Addendum)
Anesthesia Evaluation  Patient identified by MRN, date of birth, ID band Patient awake    Reviewed: Allergy & Precautions, NPO status , Patient's Chart, lab work & pertinent test results  Airway Mallampati: III       Dental no notable dental hx. (+) Teeth Intact   Pulmonary    Pulmonary exam normal breath sounds clear to auscultation       Cardiovascular hypertension, Pt. on medications Normal cardiovascular exam Rhythm:Regular Rate:Normal     Neuro/Psych negative neurological ROS  negative psych ROS   GI/Hepatic Neg liver ROS,   Endo/Other  diabetes, Type 2Hypothyroidism Morbid obesity  Renal/GU negative Renal ROS     Musculoskeletal   Abdominal (+) + obese,   Peds  Hematology   Anesthesia Other Findings Rebecca Rich  2D Echocardiogram without contrast  Order# [id number]  Ordering physician: Burnis Medin, MD Study date: 06/01/12 Result Notes for 2D Echocardiogram without contrast   Notes Recorded by Hulda Humphrey, CMA on 06/09/2012 at 1:37 PM Patient notified of all. Will discuss further with WP at next visit. ------  Notes Recorded by Burnis Medin, MD on 06/08/2012 at 5:59 PM Tell patient has Strong heart muscle but slightly thicked was that can be seen with age or high blood pressure . Valves are slightly calcified but working well. Will discuss more at [facility] is to get blood pressure in good control.   Study Result   Result status: Final result [name]          [address], [place] [address]            [phone]  ------------------------------------------------------------ Transthoracic Echocardiography  Patient:  Rebecca Rich, Rebecca Rich MR #:    [id number] Study Date: 06/01/2012 Gender:   F Age:    72 Height:   154.9cm Weight:   130.2kg BSA:    2.32m^2 Pt. Status: Room:  ATTENDING  Sonia Side   Panosh, Wanda K REFERRING  Panosh, Standley Brooking SONOGRAPHER Cindy Hazy, RDCS PERFORMING  [name] cc:  ------------------------------------------------------------ LV EF: 55% -  60%  ------------------------------------------------------------ Indications    Reproductive/Obstetrics                            Anesthesia Physical Anesthesia Plan  ASA: III  Anesthesia Plan: General   Post-op Pain Management:    Induction: Intravenous  PONV Risk Score and Plan: Ondansetron and Treatment may vary due to age or medical condition  Airway Management Planned: Oral ETT  Additional Equipment:   Intra-op Plan:   Post-operative Plan: Extubation in OR  Informed Consent: I have reviewed the patients History and Physical, chart, labs and discussed the procedure including the risks, benefits and alternatives for the proposed anesthesia with the patient or authorized representative who has indicated his/her understanding and acceptance.   Dental advisory given  Plan Discussed with: CRNA and Surgeon  Anesthesia Plan Comments:       Anesthesia Quick Evaluation

## 2017-12-30 NOTE — H&P (Signed)
Rebecca Rich is an 72 y.o. female.   Chief Complaint: abdominal pain HPI:  Pt is a 72 yo F who presented to the ED with around 1-2 days of abdominal pain.  The pain woke her from sleep Monday AM.  It has gotten a little better since then but has been persistent and severe.  Moving makes the pain worse. She hasn't tried anything for the pain.  She also had fevers and hyperglycemia.  She has been passing gas.  She denies bloating.  She hasn't had BM since this started.  Her appetite has been decreased today.    She was seen at [facility] clinic earlier and came to the ED for continued pain.  She has had nausea but no vomiting.  She has never had pain like this before. She was found to have tachyarrthymias in the ED and required IV diltiazem.    She has prior history of lap converted to open cholecystectomy and TAH.    Past Medical History:  Diagnosis Date  . Acute bronchospasm 08/24/2009  . Acute sphenoidal sinusitis 02/26/2010   Qualifier: Diagnosis of  By: Regis Bill MD, Standley Brooking   . CARDIAC MURMUR 12/08/2006  . COLONIC POLYPS, HX OF 12/08/2006  . DIABETES MELLITUS, TYPE II 12/08/2006  . Gallbladder/common duct stone, without infection, with obstruction 03/01/2010   removed ercp  . HYPERLIPIDEMIA 12/08/2006  . HYPERTENSION 12/08/2006  . 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 INFECTION, SKIN AND SOFT TISSUE 07/28/2008  . 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 LIVER FUNCTION TESTS, ABNORMAL 07/28/2008  . Morbid obesity ([place]) 12/08/2006  . OBESITY 09/24/2009  . OBSTRUCTIVE SLEEP APNEA 12/08/2006  . OSTEOARTHRITIS 12/08/2006  . RUQ PAIN 06/16/2008  . SHOULDER PAIN, RIGHT 02/07/2008  . Sleep apnea   . Swelling of limb 07/28/2008  . THYROID FUNCTION TEST, ABNORMAL 12/08/2006    Past Surgical History:  Procedure Laterality Date  . ABDOMINAL HYSTERECTOMY    . CHOLECYSTECTOMY  06/27/2008  . COLONOSCOPY N/A 12/13/2012   Procedure: COLONOSCOPY;  Surgeon: Juanita Craver, MD;  Location: [facility];  Service: Endoscopy;   Laterality: N/A;  . COLONOSCOPY Left 02/17/2017   Procedure: COLONOSCOPY;  Surgeon: Carol Ada, MD;  Location: [place];  Service: Endoscopy;  Laterality: Left;  . common duct stone     ERCP     Family History  Problem Relation Age of Onset  . Other Mother        blood clots  . Cervical cancer Mother   . Cancer Mother   . Heart disease Brother   . Diabetes Brother   . Heart disease Sister   . Diabetes Sister   . Heart disease Brother   . Diabetes Brother   . Heart disease Brother   . Diabetes Brother   . Stroke Sister   . Diabetes Sister   . Throat cancer Father   . Cancer Father   . Diabetes Other        all siblings, 4 brothers, 5 sisters   Social History:  reports that she has never smoked. She has never used smokeless tobacco. She reports that she does not drink alcohol or use drugs.  Allergies: No Known Allergies   Medications: Current Meds  Medication Sig  . amLODipine (NORVASC) 5 MG tablet TAKE 1 TABLET BY MOUTH EVERY DAY (Patient taking differently: Take 5 mg by mouth daily. )  . chlorthalidone (HYGROTON) 25 MG tablet TAKE 1 TABLET BY MOUTH EVERY DAY (Patient taking differently: Take 25 mg by mouth daily. )  .  cholecalciferol (VITAMIN D) 1000 UNITS tablet Take 1,000 Units by mouth daily.  Marland Kitchen levothyroxine (SYNTHROID, LEVOTHROID) 75 MCG tablet TAKE 1 TABLET (75 MCG TOTAL) BY MOUTH DAILY BEFORE BREAKFAST.  Marland Kitchen losartan (COZAAR) 100 MG tablet TAKE 1 TABLET (100 MG TOTAL) BY MOUTH DAILY. MAY BEGIN AT 50 MG PER DAY AND INCREASE TO 100 MG (Patient taking differently: Take 100 mg by mouth daily. )  . Misc Natural Products (OSTEO BI-FLEX ADV JOINT SHIELD PO) Take 1 tablet by mouth daily.  . Multiple Vitamins-Minerals (CENTRUM SILVER PO) Take 1 tablet by mouth daily.    . potassium chloride (K-DUR) 10 MEQ tablet TAKE 1 TABLET (10 MEQ TOTAL) BY MOUTH 3 (THREE) TIMES DAILY.     Results for orders placed or performed during the hospital encounter of 12/30/17 (from the  past 48 hour(s))  Lipase, blood     Status: None   Collection Time: 12/30/17 10:59 AM  Result Value Ref Range   Lipase 37 11 - 51 U/L    Comment: Performed at [facility], [place] [address] [place], [place] [address]  Comprehensive metabolic panel     Status: Abnormal   Collection Time: 12/30/17 10:59 AM  Result Value Ref Range   Sodium 140 135 - 145 mmol/L   Potassium 3.4 (L) 3.5 - 5.1 mmol/L   Chloride 103 98 - 111 mmol/L   CO2 22 22 - 32 mmol/L   Glucose, Bld 230 (H) 70 - 99 mg/dL   BUN 31 (H) 8 - 23 mg/dL   Creatinine, Ser 1.58 (H) 0.44 - 1.00 mg/dL   Calcium 9.1 8.9 - 10.3 mg/dL   Total Protein 7.9 6.5 - 8.1 g/dL   Albumin 4.0 3.5 - 5.0 g/dL   AST 31 15 - 41 U/L   ALT 17 0 - 44 U/L   Alkaline Phosphatase 70 38 - 126 U/L   Total Bilirubin 1.7 (H) 0.3 - 1.2 mg/dL   GFR calc non Af Amer 32 (L) >60 mL/min   GFR calc Af Amer 37 (L) >60 mL/min   Anion gap 15 5 - 15    Comment: Performed at [facility], [place] [address], [place], [place] [address]  CBC WITH DIFFERENTIAL     Status: Abnormal   Collection Time: 12/30/17 11:01 AM  Result Value Ref Range   WBC 15.1 (H) 4.0 - 10.5 K/uL   RBC 5.01 3.87 - 5.11 MIL/uL   Hemoglobin 15.1 (H) 12.0 - 15.0 g/dL   HCT 46.9 (H) 36.0 - 46.0 %   MCV 93.6 80.0 - 100.0 fL   MCH 30.1 26.0 - 34.0 pg   MCHC 32.2 30.0 - 36.0 g/dL   RDW 13.5 11.5 - 15.5 %   Platelets 147 (L) 150 - 400 K/uL   nRBC 0.0 0.0 - 0.2 %   Neutrophils Relative % 93 %   Neutro Abs 14.2 (H) 1.7 - 7.7 K/uL   Lymphocytes Relative 3 %   Lymphs Abs 0.4 (L) 0.7 - 4.0 K/uL   Monocytes Relative 3 %   Monocytes Absolute 0.4 0.1 - 1.0 K/uL   Eosinophils Relative 0 %   Eosinophils Absolute 0.0 0.0 - 0.5 K/uL   Basophils Relative 0 %   Basophils Absolute 0.0 0.0 - 0.1 K/uL   Immature Granulocytes 1 %   Abs Immature Granulocytes 0.08 (H) 0.00 - 0.07 K/uL    Comment: Performed at [facility], [place] [address], [place],  [place] [address]  I-Stat  CG4 Lactic Acid, ED     Status: Abnormal   Collection Time: 12/30/17 11:06 AM  Result Value Ref Range   Lactic Acid, Venous 3.97 (HH) 0.5 - 1.9 mmol/L   Comment NOTIFIED PHYSICIAN   I-Stat CG4 Lactic Acid, ED     Status: Abnormal   Collection Time: 12/30/17  1:05 PM  Result Value Ref Range   Lactic Acid, Venous 2.95 (HH) 0.5 - 1.9 mmol/L   Comment NOTIFIED PHYSICIAN    Dg Chest 2 View  Result Date: 12/30/2017 CLINICAL DATA:  Right-sided abdominal pain and fever which started yesterday. EXAM: CHEST - 2 VIEW COMPARISON:  10/31/2014 FINDINGS: The cardiac silhouette is enlarged. Mediastinal contours appear intact. Calcific atherosclerotic disease and tortuosity of the aorta. There is no evidence of focal airspace consolidation, pleural effusion or pneumothorax. Low lung volumes with exaggerated interstitial markings. Osseous structures are without acute abnormality. Soft tissues are grossly normal. IMPRESSION: Enlarged cardiac silhouette. Calcific atherosclerotic disease and tortuosity of the aorta. No evidence of focal airspace consolidation. Electronically Signed   By: Fidela Salisbury M.D.   On: 12/30/2017 12:21   Ct Abdomen Pelvis W Contrast  Result Date: 12/30/2017 CLINICAL DATA:  RIGHT-sided abdominal pain and fever starting yesterday. EXAM: CT ABDOMEN AND PELVIS WITH CONTRAST TECHNIQUE: Multidetector CT imaging of the abdomen and pelvis was performed using the standard protocol following bolus administration of intravenous contrast. CONTRAST:  1mL OMNIPAQUE IOHEXOL 300 MG/ML  SOLN COMPARISON:  None. FINDINGS: Lower chest: Small consolidation at the RIGHT lung base, likely atelectasis. Hepatobiliary: Status post cholecystectomy with commensurate bile duct ectasia and pneumobilia. No focal mass or lesion within the liver. Pancreas: Unremarkable. No pancreatic ductal dilatation or surrounding inflammatory changes. Spleen: Normal in size without focal abnormality.  Adrenals/Urinary Tract: Adrenal glands appear normal. Kidneys are unremarkable without mass, stone or hydronephrosis. No perinephric fluid. No ureteral or bladder calculi identified. Bladder is unremarkable. Stomach/Bowel: Appendix: Location: RIGHT lower quadrant Diameter: 1.4 cm Appendicolith: Present, measuring 8 mm. Mucosal hyper-enhancement: Not convincing. Extraluminal gas: Suspected small foci of extraluminal air near the base of the appendix, highly suspicious for early perforation. Periappendiceal collection: No collection, but fairly large amount of periappendiceal inflammation/fluid stranding. No dilated large or small bowel loops. Fluid throughout the nondistended small bowel, with associated air-fluid levels indicating ileus. Vascular/Lymphatic: Prominent atherosclerosis of the thoracic aorta and upper abdominal aorta. No acute appearing vascular abnormality. Reproductive: Uterus and bilateral adnexa are unremarkable. Other: No abscess collection seen. Musculoskeletal: No acute or suspicious osseous lesion. Advanced degenerative spondylosis throughout the lumbar spine. IMPRESSION: 1. Acute appendicitis. Appendix is distended to approximately 1.4 cm. Appendicoliths present. Suspected small foci of extraluminal air near the base of the appendix, highly suspicious for early perforation. No abscess collection, but fairly large amount of periappendiceal inflammation/fluid stranding. 2. Fluid throughout the nondistended small bowel, with associated air-fluid levels indicating associated ileus. Aortic Atherosclerosis (ICD10-I70.0). These results were called by telephone at the time of interpretation on 12/30/2017 at 6:00 pm to Dr. Vanita Panda , who verbally acknowledged these results. Electronically Signed   By: Franki Cabot M.D.   On: 12/30/2017 18:02    Review of Systems  Constitutional: Positive for chills and fever.  HENT: Negative.   Eyes: Negative.   Respiratory:       Baseline cpap at night for osa   Cardiovascular: Negative.   Gastrointestinal: Positive for abdominal pain and nausea.  Genitourinary: Negative.   Musculoskeletal: Negative.   Skin: Negative.   Neurological: Negative.   Endo/Heme/Allergies: Negative.  Psychiatric/Behavioral: Negative.     Blood pressure (!) 127/48, pulse 92, temperature (!) 102.5 F (39.2 C), temperature source Oral, resp. rate (!) 41, height 5\' 1"  (1.549 m), weight 126.6 kg, SpO2 97 %. Physical Exam  Constitutional: She is oriented to person, place, and time. She appears well-developed and well-nourished. She appears distressed.  HENT:  Head: Normocephalic and atraumatic.  Mouth/Throat: Oropharynx is clear and moist.  Eyes: Pupils are equal, round, and reactive to light. Conjunctivae are normal. Right eye exhibits no discharge. Left eye exhibits no discharge.  Neck: Neck supple. No tracheal deviation present. No thyromegaly present.  Cardiovascular: Normal rate and intact distal pulses.  Respiratory: Effort normal. No respiratory distress. She has no wheezes. She has no rales. She exhibits no tenderness.  GI: Soft. She exhibits distension. There is tenderness (RLQ). There is guarding. There is no rebound.  Lower midline incision.  RUQ incision.    Musculoskeletal: Normal range of motion.  Neurological: She is alert and oriented to person, place, and time. Coordination normal.  Skin: Skin is warm and dry. No rash noted. She is not diaphoretic. No erythema. No pallor.  Psychiatric: She has a normal mood and affect. Her behavior is normal. Judgment and thought content normal.     Assessment/Plan Acute appendicitis with probable perforation IV fluids NPO IV antibiotics To OR for lap appy. Discussed risks of surgery including bleeding, infection, damage to adjacent structures, possible need for open surgery, possible need to abort surgery and place drain, possible heart or lung complications, possible blood clot, possible prolonged hospitalization,  possible hernia at incisions, possible death.   High risk for post op abscess Lactic acidosis - fluid resuscitation  Tachycardia- probably stress mediated- will send to stepdown and consult medicine.   DM- insulin tx OSA HTN- home meds + IV as needed for excessively high BP OSA - CPAP Mild AS   Stark Klein, MD 12/30/2017, 8:56 PM

## 2017-12-30 NOTE — ED Provider Notes (Signed)
6:21 PM I discussed patient's case with our radiologist after the patient was found to have acute appendicitis with possible early perforation. Subsequently discussed patient's case with our general surgeon and inform the patient and her sister of all results. Patient continues to have ongoing pain, will receive analgesia. With fever, she will also receive rectal Tylenol. Patient has no history of tachyarrhythmia, confirmed with her currently, though she does have a history of cardiomyopathy. 6:53 PM I discussed patient's case with her cardiologist, in the patient has no history of tachyarrhythmia, there is some suspicion for this being related to her episode of sepsis, acute appendicitis per Patient is appropriate for surgery, cardiology is available for consultation.     Carmin Muskrat, MD 12/30/17 [phone]

## 2017-12-30 NOTE — ED Notes (Signed)
Pt O2 saturation 88-90% on RA placed pt on 2lpm via [place]. O2 improved to 94%

## 2017-12-31 ENCOUNTER — Inpatient Hospital Stay (HOSPITAL_COMMUNITY): Payer: Medicare Other

## 2017-12-31 ENCOUNTER — Encounter (HOSPITAL_COMMUNITY): Payer: Self-pay | Admitting: General Surgery

## 2017-12-31 DIAGNOSIS — I471 Supraventricular tachycardia: Secondary | ICD-10-CM | POA: Diagnosis present

## 2017-12-31 DIAGNOSIS — E039 Hypothyroidism, unspecified: Secondary | ICD-10-CM

## 2017-12-31 DIAGNOSIS — K3532 Acute appendicitis with perforation and localized peritonitis, without abscess: Secondary | ICD-10-CM

## 2017-12-31 DIAGNOSIS — R7989 Other specified abnormal findings of blood chemistry: Secondary | ICD-10-CM

## 2017-12-31 DIAGNOSIS — E669 Obesity, unspecified: Secondary | ICD-10-CM

## 2017-12-31 DIAGNOSIS — R0789 Other chest pain: Secondary | ICD-10-CM

## 2017-12-31 DIAGNOSIS — G4733 Obstructive sleep apnea (adult) (pediatric): Secondary | ICD-10-CM

## 2017-12-31 DIAGNOSIS — E1169 Type 2 diabetes mellitus with other specified complication: Secondary | ICD-10-CM

## 2017-12-31 DIAGNOSIS — I361 Nonrheumatic tricuspid (valve) insufficiency: Secondary | ICD-10-CM

## 2017-12-31 DIAGNOSIS — Z9989 Dependence on other enabling machines and devices: Secondary | ICD-10-CM

## 2017-12-31 LAB — CBC
HCT: 39.5 % (ref 36.0–46.0)
Hemoglobin: 12.5 g/dL (ref 12.0–15.0)
MCH: 30.2 pg (ref 26.0–34.0)
MCHC: 31.6 g/dL (ref 30.0–36.0)
MCV: 95.4 fL (ref 80.0–100.0)
Platelets: 120 10*3/uL — ABNORMAL LOW (ref 150–400)
RBC: 4.14 MIL/uL (ref 3.87–5.11)
RDW: 13.8 % (ref 11.5–15.5)
WBC: 12.6 10*3/uL — ABNORMAL HIGH (ref 4.0–10.5)
nRBC: 0 % (ref 0.0–0.2)

## 2017-12-31 LAB — URINALYSIS, ROUTINE W REFLEX MICROSCOPIC
Bilirubin Urine: NEGATIVE
Glucose, UA: NEGATIVE mg/dL
Hgb urine dipstick: NEGATIVE
Ketones, ur: NEGATIVE mg/dL
Leukocytes, UA: NEGATIVE
Nitrite: NEGATIVE
Protein, ur: 30 mg/dL — AB
Specific Gravity, Urine: 1.042 — ABNORMAL HIGH (ref 1.005–1.030)
pH: 5 (ref 5.0–8.0)

## 2017-12-31 LAB — BLOOD GAS, ARTERIAL
Acid-base deficit: 0.1 mmol/L (ref 0.0–2.0)
Bicarbonate: 23.6 mmol/L (ref 20.0–28.0)
Drawn by: 331471
O2 Content: 2 L/min
O2 Saturation: 92.3 %
PH ART: 7.412 (ref 7.350–7.450)
Patient temperature: 99.8
pCO2 arterial: 38.1 mmHg (ref 32.0–48.0)
pO2, Arterial: 65.8 mmHg — ABNORMAL LOW (ref 83.0–108.0)

## 2017-12-31 LAB — GLUCOSE, CAPILLARY
Glucose-Capillary: 168 mg/dL — ABNORMAL HIGH (ref 70–99)
Glucose-Capillary: 172 mg/dL — ABNORMAL HIGH (ref 70–99)
Glucose-Capillary: 172 mg/dL — ABNORMAL HIGH (ref 70–99)
Glucose-Capillary: 227 mg/dL — ABNORMAL HIGH (ref 70–99)
Glucose-Capillary: 236 mg/dL — ABNORMAL HIGH (ref 70–99)
Glucose-Capillary: 237 mg/dL — ABNORMAL HIGH (ref 70–99)
Glucose-Capillary: 260 mg/dL — ABNORMAL HIGH (ref 70–99)

## 2017-12-31 LAB — LACTIC ACID, PLASMA
Lactic Acid, Venous: 2.8 mmol/L (ref 0.5–1.9)
Lactic Acid, Venous: 2.3 mmol/L (ref 0.5–1.9)

## 2017-12-31 LAB — BASIC METABOLIC PANEL
Anion gap: 10 (ref 5–15)
BUN: 34 mg/dL — ABNORMAL HIGH (ref 8–23)
CO2: 22 mmol/L (ref 22–32)
Calcium: 7.8 mg/dL — ABNORMAL LOW (ref 8.9–10.3)
Chloride: 108 mmol/L (ref 98–111)
Creatinine, Ser: 1.49 mg/dL — ABNORMAL HIGH (ref 0.44–1.00)
GFR calc Af Amer: 40 mL/min — ABNORMAL LOW (ref >60)
GFR calc non Af Amer: 34 mL/min — ABNORMAL LOW (ref >60)
Glucose, Bld: 258 mg/dL — ABNORMAL HIGH (ref 70–99)
Potassium: 3.4 mmol/L — ABNORMAL LOW (ref 3.5–5.1)
Sodium: 140 mmol/L (ref 135–145)

## 2017-12-31 LAB — MRSA PCR SCREENING: MRSA BY PCR: NEGATIVE

## 2017-12-31 LAB — HEMOGLOBIN A1C
Hgb A1c MFr Bld: 7 % — ABNORMAL HIGH (ref 4.8–5.6)
MEAN PLASMA GLUCOSE: 154.2 mg/dL

## 2017-12-31 LAB — MAGNESIUM: Magnesium: 1.5 mg/dL — ABNORMAL LOW (ref 1.7–2.4)

## 2017-12-31 LAB — ECHOCARDIOGRAM COMPLETE
Height: 61 in
Weight: 4464 oz

## 2017-12-31 LAB — TROPONIN I
Troponin I: 1.11 ng/mL (ref <0.03)
Troponin I: 1.09 ng/mL (ref <0.03)
Troponin I: 0.75 ng/mL (ref <0.03)
Troponin I: 0.75 ng/mL (ref <0.03)

## 2017-12-31 LAB — TSH: TSH: 1.696 u[IU]/mL (ref 0.350–4.500)

## 2017-12-31 MED ORDER — GABAPENTIN 300 MG PO CAPS
300.0000 mg | ORAL_CAPSULE | Freq: Two times a day (BID) | ORAL | Status: DC
  Administered 2017-12-31 – 2018-01-05 (×12): 300 mg via ORAL
  Filled 2017-12-31 (×12): qty 1

## 2017-12-31 MED ORDER — PROCHLORPERAZINE MALEATE 10 MG PO TABS
10.0000 mg | ORAL_TABLET | Freq: Four times a day (QID) | ORAL | Status: DC | PRN
  Administered 2017-12-31: 10 mg via ORAL
  Filled 2017-12-31: qty 1

## 2017-12-31 MED ORDER — DIPHENHYDRAMINE HCL 50 MG/ML IJ SOLN: 12.5000 mg | Freq: Four times a day (QID) | INTRAMUSCULAR | Status: DC | PRN

## 2017-12-31 MED ORDER — KCL IN DEXTROSE-NACL 20-5-0.45 MEQ/L-%-% IV SOLN
INTRAVENOUS | Status: DC
  Administered 2017-12-31: 01:00:00 via INTRAVENOUS

## 2017-12-31 MED ORDER — POTASSIUM CHLORIDE CRYS ER 10 MEQ PO TBCR
10.0000 meq | EXTENDED_RELEASE_TABLET | Freq: Three times a day (TID) | ORAL | Status: DC
  Administered 2017-12-31 – 2018-01-01 (×6): 10 meq via ORAL
  Filled 2017-12-31 (×6): qty 1

## 2017-12-31 MED ORDER — SIMETHICONE 80 MG PO CHEW: 40.0000 mg | CHEWABLE_TABLET | Freq: Four times a day (QID) | ORAL | Status: DC | PRN

## 2017-12-31 MED ORDER — ENOXAPARIN SODIUM 40 MG/0.4ML ~~LOC~~ SOLN
40.0000 mg | SUBCUTANEOUS | Status: DC
  Administered 2017-12-31 – 2018-01-04 (×5): 40 mg via SUBCUTANEOUS
  Filled 2017-12-31 (×5): qty 0.4

## 2017-12-31 MED ORDER — CHLORTHALIDONE 25 MG PO TABS
25.0000 mg | ORAL_TABLET | Freq: Every day | ORAL | Status: DC
  Filled 2017-12-31: qty 1

## 2017-12-31 MED ORDER — PERFLUTREN LIPID MICROSPHERE
1.0000 mL | INTRAVENOUS | Status: AC | PRN
  Administered 2017-12-31: 2 mL via INTRAVENOUS
  Filled 2017-12-31: qty 10

## 2017-12-31 MED ORDER — KCL IN DEXTROSE-NACL 20-5-0.45 MEQ/L-%-% IV SOLN
INTRAVENOUS | Status: AC
  Filled 2017-12-31: qty 1000

## 2017-12-31 MED ORDER — PROCHLORPERAZINE EDISYLATE 10 MG/2ML IJ SOLN
5.0000 mg | Freq: Four times a day (QID) | INTRAMUSCULAR | Status: DC | PRN
  Administered 2018-01-03: 10 mg via INTRAVENOUS
  Filled 2017-12-31: qty 2

## 2017-12-31 MED ORDER — INSULIN ASPART 100 UNIT/ML ~~LOC~~ SOLN
0.0000 [IU] | Freq: Three times a day (TID) | SUBCUTANEOUS | Status: DC
  Administered 2017-12-31: 4 [IU] via SUBCUTANEOUS
  Administered 2017-12-31: 7 [IU] via SUBCUTANEOUS
  Administered 2017-12-31: 11 [IU] via SUBCUTANEOUS
  Administered 2018-01-01 (×3): 4 [IU] via SUBCUTANEOUS
  Administered 2018-01-02: 3 [IU] via SUBCUTANEOUS
  Administered 2018-01-02 – 2018-01-03 (×4): 4 [IU] via SUBCUTANEOUS
  Administered 2018-01-03: 7 [IU] via SUBCUTANEOUS
  Administered 2018-01-04 – 2018-01-05 (×5): 4 [IU] via SUBCUTANEOUS
  Administered 2018-01-05: 3 [IU] via SUBCUTANEOUS
  Administered 2018-01-06 (×3): 4 [IU] via SUBCUTANEOUS
  Administered 2018-01-07: 3 [IU] via SUBCUTANEOUS
  Administered 2018-01-07: 4 [IU] via SUBCUTANEOUS
  Administered 2018-01-07: 3 [IU] via SUBCUTANEOUS
  Administered 2018-01-08: 4 [IU] via SUBCUTANEOUS
  Administered 2018-01-08 – 2018-01-10 (×6): 3 [IU] via SUBCUTANEOUS

## 2017-12-31 MED ORDER — SODIUM CHLORIDE 0.9 % IV SOLN
INTRAVENOUS | Status: DC
  Administered 2017-12-31 – 2018-01-03 (×4): via INTRAVENOUS
  Filled 2017-12-31 (×10): qty 1000

## 2017-12-31 MED ORDER — METHOCARBAMOL 500 MG PO TABS: 500.0000 mg | ORAL_TABLET | Freq: Four times a day (QID) | ORAL | Status: DC | PRN

## 2017-12-31 MED ORDER — TRAMADOL HCL 50 MG PO TABS: 50.0000 mg | ORAL_TABLET | Freq: Four times a day (QID) | ORAL | Status: DC | PRN

## 2017-12-31 MED ORDER — DIPHENHYDRAMINE HCL 12.5 MG/5ML PO ELIX: 12.5000 mg | ORAL_SOLUTION | Freq: Four times a day (QID) | ORAL | Status: DC | PRN

## 2017-12-31 MED ORDER — HYDROMORPHONE HCL 1 MG/ML IJ SOLN
0.5000 mg | INTRAMUSCULAR | Status: DC | PRN
  Administered 2018-01-03 (×2): 1 mg via INTRAVENOUS
  Filled 2017-12-31 (×2): qty 1

## 2017-12-31 MED ORDER — SENNA 8.6 MG PO TABS
1.0000 | ORAL_TABLET | Freq: Two times a day (BID) | ORAL | Status: DC
  Administered 2017-12-31 – 2018-01-01 (×5): 8.6 mg via ORAL
  Filled 2017-12-31 (×5): qty 1

## 2017-12-31 MED ORDER — HYDRALAZINE HCL 20 MG/ML IJ SOLN
10.0000 mg | INTRAMUSCULAR | Status: DC | PRN
  Administered 2018-01-02 – 2018-01-04 (×4): 10 mg via INTRAVENOUS
  Filled 2017-12-31 (×4): qty 1

## 2017-12-31 MED ORDER — MAGNESIUM SULFATE 2 GM/50ML IV SOLN
2.0000 g | Freq: Once | INTRAVENOUS | Status: AC
  Administered 2017-12-31: 2 g via INTRAVENOUS
  Filled 2017-12-31: qty 50

## 2017-12-31 MED ORDER — ONDANSETRON HCL 4 MG/2ML IJ SOLN
4.0000 mg | Freq: Four times a day (QID) | INTRAMUSCULAR | Status: DC | PRN
  Administered 2017-12-31 – 2018-01-01 (×2): 4 mg via INTRAVENOUS
  Filled 2017-12-31 (×2): qty 2

## 2017-12-31 MED ORDER — LEVOTHYROXINE SODIUM 50 MCG PO TABS
75.0000 ug | ORAL_TABLET | Freq: Every day | ORAL | Status: DC
  Administered 2017-12-31 – 2018-01-10 (×11): 75 ug via ORAL
  Filled 2017-12-31 (×11): qty 1

## 2017-12-31 MED ORDER — OXYCODONE HCL 5 MG PO TABS
5.0000 mg | ORAL_TABLET | ORAL | Status: DC | PRN
  Administered 2017-12-31: 5 mg via ORAL
  Administered 2018-01-01: 10 mg via ORAL
  Filled 2017-12-31: qty 2
  Filled 2017-12-31: qty 1

## 2017-12-31 MED ORDER — ACETAMINOPHEN 325 MG PO TABS: 650.0000 mg | ORAL_TABLET | Freq: Four times a day (QID) | ORAL | Status: DC | PRN

## 2017-12-31 MED ORDER — ACETAMINOPHEN 650 MG RE SUPP: 650.0000 mg | Freq: Four times a day (QID) | RECTAL | Status: DC | PRN

## 2017-12-31 MED ORDER — SODIUM CHLORIDE 0.9 % IV BOLUS
1000.0000 mL | Freq: Once | INTRAVENOUS | Status: AC
  Administered 2017-12-31: 1000 mL via INTRAVENOUS

## 2017-12-31 MED ORDER — HYDRALAZINE HCL 20 MG/ML IJ SOLN: 20.0000 mg | INTRAMUSCULAR | Status: DC | PRN

## 2017-12-31 MED ORDER — PIPERACILLIN-TAZOBACTAM 3.375 G IVPB
3.3750 g | Freq: Three times a day (TID) | INTRAVENOUS | Status: DC
  Administered 2017-12-31 – 2018-01-05 (×17): 3.375 g via INTRAVENOUS
  Filled 2017-12-31 (×16): qty 50

## 2017-12-31 NOTE — Consult Note (Addendum)
Reason for Consult: Medical management. Referring Physician: Dr. Barry Dienes.  Rebecca Rich is an 72 y.o. female.  HPI: 72 year old female with history of morbid obesity, sleep apnea, hypertension, diabetes mellitus type 2 presents to the ER because of 2 days of abdominal pain mostly in the lower quadrants with nausea.  Pain has been worsening with increased on minimal movement.  Had gone to urgent care and was referred to the ER.  In the ER CT of the abdomen and pelvis done shows possible perforated appendicitis and was taken to the OR.  Before taken to the OR patient had a brief run of SVT and was placed on Cardizem infusion.  Following the surgery patient's heart rate is in the normal range sinus.  On my exam after surgery patient feels much better denies any chest pain or shortness of breath.  Past Medical History:  Diagnosis Date  . Acute bronchospasm 08/24/2009  . Acute sphenoidal sinusitis 02/26/2010   Qualifier: Diagnosis of  By: Regis Bill MD, Standley Brooking   . CARDIAC MURMUR 12/08/2006  . COLONIC POLYPS, HX OF 12/08/2006  . DIABETES MELLITUS, TYPE II 12/08/2006  . Gallbladder/common duct stone, without infection, with obstruction 03/01/2010   removed ercp  . HYPERLIPIDEMIA 12/08/2006  . HYPERTENSION 12/08/2006  . 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 INFECTION, SKIN AND SOFT TISSUE 07/28/2008  . 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 LIVER FUNCTION TESTS, ABNORMAL 07/28/2008  . Morbid obesity ([place]) 12/08/2006  . OBESITY 09/24/2009  . OBSTRUCTIVE SLEEP APNEA 12/08/2006  . OSTEOARTHRITIS 12/08/2006  . RUQ PAIN 06/16/2008  . SHOULDER PAIN, RIGHT 02/07/2008  . Sleep apnea   . Swelling of limb 07/28/2008  . THYROID FUNCTION TEST, ABNORMAL 12/08/2006    Past Surgical History:  Procedure Laterality Date  . ABDOMINAL HYSTERECTOMY    . CHOLECYSTECTOMY  06/27/2008  . COLONOSCOPY N/A 12/13/2012   Procedure: COLONOSCOPY;  Surgeon: Juanita Craver, MD;  Location: [facility];  Service: Endoscopy;  Laterality: N/A;  . COLONOSCOPY  Left 02/17/2017   Procedure: COLONOSCOPY;  Surgeon: Carol Ada, MD;  Location: [facility];  Service: Endoscopy;  Laterality: Left;  . common duct stone     ERCP     Family History  Problem Relation Age of Onset  . Other Mother        blood clots  . Cervical cancer Mother   . Cancer Mother   . Heart disease Brother   . Diabetes Brother   . Heart disease Sister   . Diabetes Sister   . Heart disease Brother   . Diabetes Brother   . Heart disease Brother   . Diabetes Brother   . Stroke Sister   . Diabetes Sister   . Throat cancer Father   . Cancer Father   . Diabetes Other        all siblings, 4 brothers, 5 sisters    Social History:  reports that she has never smoked. She has never used smokeless tobacco. She reports that she does not drink alcohol or use drugs.  Allergies: No Known Allergies  Medications: I have reviewed the patient's current medications.  Results for orders placed or performed during the hospital encounter of 12/30/17 (from the past 48 hour(s))  Lipase, blood     Status: None   Collection Time: 12/30/17 10:59 AM  Result Value Ref Range   Lipase 37 11 - 51 U/L    Comment: Performed at [facility], [place] [address], [place] [address]  Comprehensive metabolic panel  Status: Abnormal   Collection Time: 12/30/17 10:59 AM  Result Value Ref Range   Sodium 140 135 - 145 mmol/L   Potassium 3.4 (L) 3.5 - 5.1 mmol/L   Chloride 103 98 - 111 mmol/L   CO2 22 22 - 32 mmol/L   Glucose, Bld 230 (H) 70 - 99 mg/dL   BUN 31 (H) 8 - 23 mg/dL   Creatinine, Ser 1.58 (H) 0.44 - 1.00 mg/dL   Calcium 9.1 8.9 - 10.3 mg/dL   Total Protein 7.9 6.5 - 8.1 g/dL   Albumin 4.0 3.5 - 5.0 g/dL   AST 31 15 - 41 U/L   ALT 17 0 - 44 U/L   Alkaline Phosphatase 70 38 - 126 U/L   Total Bilirubin 1.7 (H) 0.3 - 1.2 mg/dL   GFR calc non Af Amer 32 (L) >60 mL/min   GFR calc Af Amer 37 (L) >60 mL/min   Anion gap 15 5 - 15    Comment: Performed at [facility], [place] [address], [place], [place] [address]  CBC WITH DIFFERENTIAL     Status: Abnormal   Collection Time: 12/30/17 11:01 AM  Result Value Ref Range   WBC 15.1 (H) 4.0 - 10.5 K/uL   RBC 5.01 3.87 - 5.11 MIL/uL   Hemoglobin 15.1 (H) 12.0 - 15.0 g/dL   HCT 46.9 (H) 36.0 - 46.0 %   MCV 93.6 80.0 - 100.0 fL   MCH 30.1 26.0 - 34.0 pg   MCHC 32.2 30.0 - 36.0 g/dL   RDW 13.5 11.5 - 15.5 %   Platelets 147 (L) 150 - 400 K/uL   nRBC 0.0 0.0 - 0.2 %   Neutrophils Relative % 93 %   Neutro Abs 14.2 (H) 1.7 - 7.7 K/uL   Lymphocytes Relative 3 %   Lymphs Abs 0.4 (L) 0.7 - 4.0 K/uL   Monocytes Relative 3 %   Monocytes Absolute 0.4 0.1 - 1.0 K/uL   Eosinophils Relative 0 %   Eosinophils Absolute 0.0 0.0 - 0.5 K/uL   Basophils Relative 0 %   Basophils Absolute 0.0 0.0 - 0.1 K/uL   Immature Granulocytes 1 %   Abs Immature Granulocytes 0.08 (H) 0.00 - 0.07 K/uL    Comment: Performed at [facility], [place] [address] [place], [place] [address]  I-Stat CG4 Lactic Acid, ED     Status: Abnormal   Collection Time: 12/30/17 11:06 AM  Result Value Ref Range   Lactic Acid, Venous 3.97 (HH) 0.5 - 1.9 mmol/L   Comment NOTIFIED PHYSICIAN   I-Stat CG4 Lactic Acid, ED     Status: Abnormal   Collection Time: 12/30/17  1:05 PM  Result Value Ref Range   Lactic Acid, Venous 2.95 (HH) 0.5 - 1.9 mmol/L   Comment NOTIFIED PHYSICIAN   Glucose, capillary     Status: Abnormal   Collection Time: 12/30/17 11:59 PM  Result Value Ref Range   Glucose-Capillary 172 (H) 70 - 99 mg/dL    Dg Chest 2 View  Result Date: 12/30/2017 CLINICAL DATA:  Right-sided abdominal pain and fever which started yesterday. EXAM: CHEST - 2 VIEW COMPARISON:  10/31/2014 FINDINGS: The cardiac silhouette is enlarged. Mediastinal contours appear intact. Calcific atherosclerotic disease and tortuosity of the aorta. There is no evidence of focal airspace consolidation, pleural effusion or pneumothorax. Low  lung volumes with exaggerated interstitial markings. Osseous structures are without acute abnormality. Soft tissues are grossly normal. IMPRESSION: Enlarged cardiac silhouette. Calcific atherosclerotic disease  and tortuosity of the aorta. No evidence of focal airspace consolidation. Electronically Signed   By: Fidela Salisbury M.D.   On: 12/30/2017 12:21   Ct Abdomen Pelvis W Contrast  Result Date: 12/30/2017 CLINICAL DATA:  RIGHT-sided abdominal pain and fever starting yesterday. EXAM: CT ABDOMEN AND PELVIS WITH CONTRAST TECHNIQUE: Multidetector CT imaging of the abdomen and pelvis was performed using the standard protocol following bolus administration of intravenous contrast. CONTRAST:  31mL OMNIPAQUE IOHEXOL 300 MG/ML  SOLN COMPARISON:  None. FINDINGS: Lower chest: Small consolidation at the RIGHT lung base, likely atelectasis. Hepatobiliary: Status post cholecystectomy with commensurate bile duct ectasia and pneumobilia. No focal mass or lesion within the liver. Pancreas: Unremarkable. No pancreatic ductal dilatation or surrounding inflammatory changes. Spleen: Normal in size without focal abnormality. Adrenals/Urinary Tract: Adrenal glands appear normal. Kidneys are unremarkable without mass, stone or hydronephrosis. No perinephric fluid. No ureteral or bladder calculi identified. Bladder is unremarkable. Stomach/Bowel: Appendix: Location: RIGHT lower quadrant Diameter: 1.4 cm Appendicolith: Present, measuring 8 mm. Mucosal hyper-enhancement: Not convincing. Extraluminal gas: Suspected small foci of extraluminal air near the base of the appendix, highly suspicious for early perforation. Periappendiceal collection: No collection, but fairly large amount of periappendiceal inflammation/fluid stranding. No dilated large or small bowel loops. Fluid throughout the nondistended small bowel, with associated air-fluid levels indicating ileus. Vascular/Lymphatic: Prominent atherosclerosis of the thoracic aorta  and upper abdominal aorta. No acute appearing vascular abnormality. Reproductive: Uterus and bilateral adnexa are unremarkable. Other: No abscess collection seen. Musculoskeletal: No acute or suspicious osseous lesion. Advanced degenerative spondylosis throughout the lumbar spine. IMPRESSION: 1. Acute appendicitis. Appendix is distended to approximately 1.4 cm. Appendicoliths present. Suspected small foci of extraluminal air near the base of the appendix, highly suspicious for early perforation. No abscess collection, but fairly large amount of periappendiceal inflammation/fluid stranding. 2. Fluid throughout the nondistended small bowel, with associated air-fluid levels indicating associated ileus. Aortic Atherosclerosis (ICD10-I70.0). These results were called by telephone at the time of interpretation on 12/30/2017 at 6:00 pm to Dr. Vanita Panda , who verbally acknowledged these results. Electronically Signed   By: Franki Cabot M.D.   On: 12/30/2017 18:02    Review of Systems  Constitutional: Negative.   HENT: Negative.   Eyes: Negative.   Respiratory: Negative.   Cardiovascular: Negative.   Gastrointestinal: Positive for abdominal pain and nausea.  Genitourinary: Negative.   Musculoskeletal: Negative.   Skin: Negative.   Neurological: Negative.   Endo/Heme/Allergies: Negative.   Psychiatric/Behavioral: Negative.    Blood pressure (!) 123/43, pulse (!) 102, temperature 99.4 F (37.4 C), resp. rate (!) 38, height 5\' 1"  (1.549 m), weight 126.6 kg, SpO2 95 %. Physical Exam  Constitutional: She is oriented to person, place, and time. She appears well-developed and well-nourished. No distress.  HENT:  Head: Normocephalic and atraumatic.  Eyes: Pupils are equal, round, and reactive to light. Conjunctivae are normal. Right eye exhibits no discharge. Left eye exhibits no discharge.  Neck: Normal range of motion. Neck supple.  Cardiovascular: Normal rate and regular rhythm.  Respiratory: Effort  normal and breath sounds normal.  GI: Soft. Bowel sounds are normal. She exhibits no distension.  Musculoskeletal: Normal range of motion. She exhibits no edema or deformity.  Neurological: She is alert and oriented to person, place, and time. No cranial nerve deficit.  Skin: Skin is warm. She is not diaphoretic.  Psychiatric: Her behavior is normal.    Assessment/Plan: #1.  Diabetes mellitus type 2 we will keep patient on sliding scale coverage.  Closely follow CBGs.  Change to LR if possible from D5 normal saline after discussing with surgery. #2.  Hypertension -we will keep patient on PRN IV hydralazine and hold off antihypertensives due to low normal blood pressure initially. #3.  Sleep apnea on CPAP. #4.  Possible developing sepsis from perforated appendix and possible developing ileus -appreciate general surgery input patient is on Zosyn and clear liquids.  Continue to monitor closely.  Follow lactate. #5.  Hypothyroidism on Synthroid. #6.  Brief run of SVT which improved with IV Cardizem.  Presently in sinus rhythm.  Will check TSH and troponin.  Thanks for involving [place] in patient's care we will follow along with you.  Rise Patience 12/31/2017, 1:36 AM

## 2017-12-31 NOTE — Progress Notes (Signed)
Patient was admitted yesterday by General Surgery for acute appendicitis perforation with localized abscess status post lap appendectomy and drain placement on 12/30/2017 by Dr. Stark Klein and Medicine has been consulted for further medical management.  Please see the consult note by Dr. Gean Birchwood done early this morning and I am in agreement with his impression and recommendations. Additional changes the plan of care been made accordingly.  Patient this morning states that her abdominal pain is improved and she just has muscle spasms.  She denies any chest pain, lightheadedness or dizziness.  No nausea or vomiting.  Her troponin elevated to 1.11 and however I feel this is demand ischemia in the setting of SVT and mild renal dysfunction.  Repeat troponin is 1.09.  Nonetheless Cardiology consulted for further evaluation recommendations they are ordering echocardiogram to assess for any wall motion abnormalities which I am in agreement with.  Continue IV Zosyn at the recommendations of General Surgery. We continue to follow medically and follow patient's clinical response to intervention provided.

## 2017-12-31 NOTE — Progress Notes (Signed)
Checked pts blood sugar per Dr. Moise Boring request upon admission to ICU. Blood Sugar 236 - paged Dr. Kayleen Memos to wait until 0800 until coverage.

## 2017-12-31 NOTE — Progress Notes (Signed)
1 Day Post-Op    CC: Right abdominal pain  Subjective: Patient is in bed she is still fairly uncomfortable she says she is short of breath.  She did sleep with her CPAP.  She denies any chest pain.  Her abdomen is still sore.  She looks generally uncomfortable, but is napping when left alone.  Currently she is in a sinus rhythm with good oxygen saturations.  Sats are 93% on 2 L nasal cannula but her respiratory rate remains elevated currently at 36.  She is off CPAP and off the Cardizem.  Abdominal incisions are okay.  The drainage from the JP is rather bloody in appearance. Objective: Vital signs in last 24 hours: Temp:  [98.6 F (37 C)-102.9 F (39.4 C)] 99.8 F (37.7 C) (12/05 0800) Pulse Rate:  [20-174] 91 (12/05 0600) Resp:  [20-54] 29 (12/05 0600) BP: (97-170)/(40-69) 123/47 (12/05 0600) SpO2:  [94 %-100 %] 97 % (12/05 0600) Weight:  [126.6 kg] 126.6 kg (12/04 1018)  4790 IV 780 IV Drain 180 T-max 102.9 on admission currently afebrile postop Tachycardia with heart rates reported in the 180 range and PACU Currently down in the 90s on a Cardizem drip Potassium 3.4, glucose 258, creatinine 1.49, magnesium 1.5. Troponin 1.11 reported at 0 730  Lactate 2.95 >>2.8 EKG at 1326 12/30/2017 shows atrial fibrillation with rate of 173 with some ST changes.  Currently in a sinus rhythm with a rate of 94.  intake/Output from previous day: 12/04 0701 - 12/05 0700 In: 4790.4 [I.V.:3569.4; IV Piggyback:1221] Out: 965 [Urine:780; Drains:180; Blood:5] Intake/Output this shift: No intake/output data recorded.  General appearance: alert, appears stated age, no distress and Says she feels short of breath Resp: clear to auscultation bilaterally and Anterior Cardio: Currently in sinus rhythm denies any chest pain or discomfort. GI: Multiple abdominal scars.  Abdomen still tender.  Port sites look fine.  JP drain is bloody.  Lab Results:  Recent Labs    12/30/17 1101 12/31/17 0314  WBC  15.1* 12.6*  HGB 15.1* 12.5  HCT 46.9* 39.5  PLT 147* 120*    BMET Recent Labs    12/30/17 1059 12/31/17 0314  NA 140 140  K 3.4* 3.4*  CL 103 108  CO2 22 22  GLUCOSE 230* 258*  BUN 31* 34*  CREATININE 1.58* 1.49*  CALCIUM 9.1 7.8*   PT/INR No results for input(s): LABPROT, INR in the last 72 hours.  Recent Labs  Lab 12/30/17 1059  AST 31  ALT 17  ALKPHOS 70  BILITOT 1.7*  PROT 7.9  ALBUMIN 4.0     Lipase     Component Value Date/Time   LIPASE 37 12/30/2017 1059     Medications: . enoxaparin (LOVENOX) injection  40 mg Subcutaneous Q24H  . gabapentin  300 mg Oral BID  . insulin aspart  0-20 Units Subcutaneous TID WC  . levothyroxine  75 mcg Oral Q0600  . potassium chloride  10 mEq Oral TID  . senna  1 tablet Oral BID   Prior to Admission medications   Medication Sig Start Date End Date Taking? Authorizing Provider  amLODipine (NORVASC) 5 MG tablet TAKE 1 TABLET BY MOUTH EVERY DAY Patient taking differently: Take 5 mg by mouth daily.  10/28/17  Yes Panosh, Standley Brooking, MD  chlorthalidone (HYGROTON) 25 MG tablet TAKE 1 TABLET BY MOUTH EVERY DAY Patient taking differently: Take 25 mg by mouth daily.  10/21/17  Yes Panosh, Standley Brooking, MD  cholecalciferol (VITAMIN D) 1000 UNITS tablet  Take 1,000 Units by mouth daily.   Yes [provider]  levothyroxine (SYNTHROID, LEVOTHROID) 75 MCG tablet TAKE 1 TABLET (75 MCG TOTAL) BY MOUTH DAILY BEFORE BREAKFAST. 12/17/17  Yes Panosh, Standley Brooking, MD  losartan (COZAAR) 100 MG tablet TAKE 1 TABLET (100 MG TOTAL) BY MOUTH DAILY. MAY BEGIN AT 50 MG PER DAY AND INCREASE TO 100 MG Patient taking differently: Take 100 mg by mouth daily.  10/21/17  Yes Panosh, Standley Brooking, MD  Misc Natural Products (OSTEO BI-FLEX ADV JOINT SHIELD PO) Take 1 tablet by mouth daily.   Yes [provider]  Multiple Vitamins-Minerals (CENTRUM SILVER PO) Take 1 tablet by mouth daily.     Yes [provider]  potassium chloride (K-DUR) 10 MEQ  tablet TAKE 1 TABLET (10 MEQ TOTAL) BY MOUTH 3 (THREE) TIMES DAILY. 10/22/17  Yes Panosh, Standley Brooking, MD  glucose blood (ACCU-CHEK AVIVA PLUS) test strip Use to check blood sugar daily E11.9 08/26/16   Panosh, Standley Brooking, MD  Lancets (ACCU-CHEK SOFT TOUCH) lancets Use to test blood glucose twice daily 08/26/16   Panosh, Standley Brooking, MD     . dextrose 5 % and 0.45 % NaCl with KCl 20 mEq/L 100 mL/hr at 12/31/17 0108  . diltiazem (CARDIZEM) infusion 5 mg/hr (12/30/17 1355)  . magnesium sulfate 1 - 4 g bolus IVPB 2 g (12/31/17 0849)  . piperacillin-tazobactam (ZOSYN)  IV Stopped (12/31/17 0847)   Anti-infectives (From admission, onward)   Start     Dose/Rate Route Frequency Ordered Stop   12/31/17 0100  piperacillin-tazobactam (ZOSYN) IVPB 3.375 g     3.375 g 12.5 mL/hr over 240 Minutes Intravenous Every 8 hours 12/31/17 0055     12/30/17 1045  ceFEPIme (MAXIPIME) 2 g in sodium chloride 0.9 % 100 mL IVPB     2 g 200 mL/hr over 30 Minutes Intravenous  Once 12/30/17 1044 12/30/17 1342   12/30/17 1045  metroNIDAZOLE (FLAGYL) IVPB 500 mg  Status:  Discontinued     500 mg 100 mL/hr over 60 Minutes Intravenous Every 8 hours 12/30/17 1044 12/31/17 0055      Assessment/Plan Hypertension AKI - new 1.04 (08/04/17)  1.58 >> 1.49 this AM Type 2 diabetes Sleep apnea on CPAP Hypothyroid Morbid obesity    Sepsis Acute appendicitis perforation/localized abscess Laparoscopic appendectomy, drain placement, 12/30/2017 Dr. Stark Klein. Postop SVT placed on Cardizem/postiive Troponin Hx of Mild AR/AS, grade II diastolic dysfunction, EF 42% 12/2014  FEN: IV fluids/Cardizem drip magnesium replacement ID: Maxipime/Flagyl [id number] >> Zosyn 12/5 =>>day 1 DVT: Lovenox Follow-up: To be determined  Plan: Serial CK and troponins.  ABGs, portable chest x-ray, EKG ordered.  She has a magnesium replacement ordered.  I will change her IV to normal saline without the dextrose and continue potassium replacement.  Medicine and  cardiology have been asked to see and evaluate also.  We will keep her n.p.o. for now except for some ice chips, and follow closely.     LOS: 1 day    Jarrod Bodkins 12/31/2017 [phone]

## 2017-12-31 NOTE — Consult Note (Addendum)
Cardiology Consultation:   Patient ID: Rebecca Rich MRN: [id number]; DOB: [birth year]  Admit date: 12/30/2017 Date of Consult: 12/31/2017  Primary Care Provider: Burnis Medin, MD Primary Cardiologist: New (Dr. Harrington Challenger) Primary Electrophysiologist:  None   Patient Profile:   Rebecca Rich is a 72 y.o. female with a hx of DM, HTN, HLD and obesity admitted with acute appendicitis s/p appendectomy who is being seen today for SVT and elevated troponin at the request of Dr. Hal Hope, Internal Medicine.   History of Present Illness:   Rebecca Rich is a 72 y.o. female with a hx of DM, HTN, HLD and obesity admitted with acute appendicitis s/p appendectomy who is being seen today for SVT and elevated troponin at the request of Dr. Hal Hope, Internal Medicine.   She has no prior cardiac history. She came to the ED yesterday with acute abdominal pain. No CP. Abdominal CT showed acute appendicitis and concerns for early perforation. Admitted by general surgery. Underwent appendectomy. She is NPO and on antibiotics.   It was noted that prior to going to the OR, pt had a brief run of SVT and was placed on IV Cardizem. She had another brief run of SVT this morning around 8:30 with HR in the 140s. TSH and troponins were checked. Troponin returned abnormal at 1.11. Repeat troponin 1.09.  TSH normal. EKG today shows NSR. No ischemic abnormalities. She denies CP. Some mild dyspnea and currently on supplemental O2. Additional labs notable for renal insuffiencey. SCr 1.49 today. BUN 34. Also hypokalemia at 3.4. Supplemental K has been ordered.     Past Medical History:  Diagnosis Date  . Acute bronchospasm 08/24/2009  . Acute sphenoidal sinusitis 02/26/2010   Qualifier: Diagnosis of  By: Regis Bill MD, Standley Brooking   . CARDIAC MURMUR 12/08/2006  . COLONIC POLYPS, HX OF 12/08/2006  . DIABETES MELLITUS, TYPE II 12/08/2006  . Gallbladder/common duct stone, without infection, with obstruction 03/01/2010   removed  ercp  . HYPERLIPIDEMIA 12/08/2006  . HYPERTENSION 12/08/2006  . 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 INFECTION, SKIN AND SOFT TISSUE 07/28/2008  . 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 LIVER FUNCTION TESTS, ABNORMAL 07/28/2008  . Morbid obesity ([place]) 12/08/2006  . OBESITY 09/24/2009  . OBSTRUCTIVE SLEEP APNEA 12/08/2006  . OSTEOARTHRITIS 12/08/2006  . RUQ PAIN 06/16/2008  . SHOULDER PAIN, RIGHT 02/07/2008  . Sleep apnea   . Swelling of limb 07/28/2008  . THYROID FUNCTION TEST, ABNORMAL 12/08/2006    Past Surgical History:  Procedure Laterality Date  . ABDOMINAL HYSTERECTOMY    . CHOLECYSTECTOMY  06/27/2008  . COLONOSCOPY N/A 12/13/2012   Procedure: COLONOSCOPY;  Surgeon: Juanita Craver, MD;  Location: [facility];  Service: Endoscopy;  Laterality: N/A;  . COLONOSCOPY Left 02/17/2017   Procedure: COLONOSCOPY;  Surgeon: Carol Ada, MD;  Location: [place];  Service: Endoscopy;  Laterality: Left;  . common duct stone     ERCP   . LAPAROSCOPIC APPENDECTOMY N/A 12/30/2017   Procedure: APPENDECTOMY LAPAROSCOPIC;  Surgeon: Stark Klein, MD;  Location: [facility];  Service: General;  Laterality: N/A;     Home Medications:  Prior to Admission medications   Medication Sig Start Date End Date Taking? Authorizing Provider  amLODipine (NORVASC) 5 MG tablet TAKE 1 TABLET BY MOUTH EVERY DAY Patient taking differently: Take 5 mg by mouth daily.  10/28/17  Yes Panosh, Standley Brooking, MD  chlorthalidone (HYGROTON) 25 MG tablet TAKE 1 TABLET BY MOUTH EVERY DAY Patient taking differently: Take 25 mg by mouth daily.  10/21/17  Yes Panosh, Standley Brooking, MD  cholecalciferol (VITAMIN D) 1000 UNITS tablet Take 1,000 Units by mouth daily.   Yes [provider]  levothyroxine (SYNTHROID, LEVOTHROID) 75 MCG tablet TAKE 1 TABLET (75 MCG TOTAL) BY MOUTH DAILY BEFORE BREAKFAST. 12/17/17  Yes Panosh, Standley Brooking, MD  losartan (COZAAR) 100 MG tablet TAKE 1 TABLET (100 MG TOTAL) BY MOUTH DAILY. MAY BEGIN AT 50 MG PER DAY AND INCREASE TO 100  MG Patient taking differently: Take 100 mg by mouth daily.  10/21/17  Yes Panosh, Standley Brooking, MD  Misc Natural Products (OSTEO BI-FLEX ADV JOINT SHIELD PO) Take 1 tablet by mouth daily.   Yes [provider]  Multiple Vitamins-Minerals (CENTRUM SILVER PO) Take 1 tablet by mouth daily.     Yes [provider]  potassium chloride (K-DUR) 10 MEQ tablet TAKE 1 TABLET (10 MEQ TOTAL) BY MOUTH 3 (THREE) TIMES DAILY. 10/22/17  Yes Panosh, Standley Brooking, MD  glucose blood (ACCU-CHEK AVIVA PLUS) test strip Use to check blood sugar daily E11.9 08/26/16   Panosh, Standley Brooking, MD  Lancets (ACCU-CHEK SOFT TOUCH) lancets Use to test blood glucose twice daily 08/26/16   Panosh, Standley Brooking, MD    Inpatient Medications: Scheduled Meds: . enoxaparin (LOVENOX) injection  40 mg Subcutaneous Q24H  . gabapentin  300 mg Oral BID  . insulin aspart  0-20 Units Subcutaneous TID WC  . levothyroxine  75 mcg Oral Q0600  . potassium chloride  10 mEq Oral TID  . senna  1 tablet Oral BID   Continuous Infusions: . diltiazem (CARDIZEM) infusion 5 mg/hr (12/30/17 1355)  . piperacillin-tazobactam (ZOSYN)  IV Stopped (12/31/17 0602)  . 0.9 % sodium chloride with kcl     PRN Meds: acetaminophen **OR** acetaminophen, diphenhydrAMINE **OR** diphenhydrAMINE, hydrALAZINE, HYDROmorphone (DILAUDID) injection, methocarbamol, oxyCODONE, prochlorperazine **OR** prochlorperazine, simethicone, traMADol  Allergies:   No Known Allergies  Social History:   Social History   Socioeconomic History  . Marital status: Divorced    Spouse name: Not on file  . Number of children: Not on file  . Years of education: Not on file  . Highest education level: Not on file  Occupational History  . Occupation: retired    Fish farm manager: RETIRED  Social Needs  . Financial resource strain: Not on file  . Food insecurity:    Worry: Not on file    Inability: Not on file  . Transportation needs:    Medical: Not on file    Non-medical: Not on file   Tobacco Use  . Smoking status: Never Smoker  . Smokeless tobacco: Never Used  Substance and Sexual Activity  . Alcohol use: No    Alcohol/week: 0.0 standard drinks  . Drug use: No  . Sexual activity: Not on file  Lifestyle  . Physical activity:    Days per week: Not on file    Minutes per session: Not on file  . Stress: Not on file  Relationships  . Social connections:    Talks on phone: Not on file    Gets together: Not on file    Attends religious service: Not on file    Active member of club or organization: Not on file    Attends meetings of clubs or organizations: Not on file    Relationship status: Not on file  . Intimate partner violence:    Fear of current or ex partner: Not on file    Emotionally abused: Not on file    Physically abused:  Not on file    Forced sexual activity: Not on file  Other Topics Concern  . Not on file  Social History Narrative   Master level education in math   Pt is currently retired   Pt is divorced with children   Recently had to move had a break in and thus away from her pool exercise    Family History:    Family History  Problem Relation Age of Onset  . Other Mother        blood clots  . Cervical cancer Mother   . Cancer Mother   . Heart disease Brother   . Diabetes Brother   . Heart disease Sister   . Diabetes Sister   . Heart disease Brother   . Diabetes Brother   . Heart disease Brother   . Diabetes Brother   . Stroke Sister   . Diabetes Sister   . Throat cancer Father   . Cancer Father   . Diabetes Other        all siblings, 4 brothers, 5 sisters     ROS:  Please see the history of present illness.   All other ROS reviewed and negative.     Physical Exam/Data:   Vitals:   12/31/17 0700 12/31/17 0800 12/31/17 0900 12/31/17 1000  BP: (!) 129/43 (!) 133/55 (!) 126/48 (!) 126/48  Pulse: 91 93 93 94  Resp: (!) 32 (!) 38 (!) 43 (!) 33  Temp:  99.8 F (37.7 C)    TempSrc:  Oral    SpO2: 99% 98% 92% 93%   Weight:      Height:        Intake/Output Summary (Last 24 hours) at 12/31/2017 1049 Last data filed at 12/31/2017 1000 Gross per 24 hour  Intake 5565.82 ml  Output 965 ml  Net 4600.82 ml   Filed Weights   12/30/17 1018  Weight: 126.6 kg   Body mass index is 52.72 kg/m.  General:  Obese female, Well nourished, well developed, in no acute distress HEENT: normal Lymph: no adenopathy Neck: no JVD Endocrine:  No thryomegaly Vascular: No carotid bruits; FA pulses 2+ bilaterally without bruits  Cardiac:  normal S1, S2; RRR; no murmur  Lungs:  clear to auscultation bilaterally, no wheezing, rhonchi or rales  Abd: soft, nontender, no hepatomegaly  Ext: no edema Musculoskeletal:  No deformities, BUE and BLE strength normal and equal Skin: warm and dry  Neuro:  CNs 2-12 intact, no focal abnormalities noted Psych:  Normal affect   EKG:  The EKG was personally reviewed and demonstrates:  NSR, nonspecific ST abnormalities  Telemetry:  Telemetry was personally reviewed and demonstrates:  Currently NSR. Brief run of SVT ~8:30 am today. 140s.   Relevant CV Studies: 2D echo pending.   Laboratory Data:  Chemistry Recent Labs  Lab 12/30/17 1059 12/31/17 0314  NA 140 140  K 3.4* 3.4*  CL 103 108  CO2 22 22  GLUCOSE 230* 258*  BUN 31* 34*  CREATININE 1.58* 1.49*  CALCIUM 9.1 7.8*  GFRNONAA 32* 34*  GFRAA 37* 40*  ANIONGAP 15 10    Recent Labs  Lab 12/30/17 1059  PROT 7.9  ALBUMIN 4.0  AST 31  ALT 17  ALKPHOS 70  BILITOT 1.7*   Hematology Recent Labs  Lab 12/30/17 1101 12/31/17 0314  WBC 15.1* 12.6*  RBC 5.01 4.14  HGB 15.1* 12.5  HCT 46.9* 39.5  MCV 93.6 95.4  MCH 30.1 30.2  MCHC 32.2 31.6  RDW 13.5 13.8  PLT 147* 120*   Cardiac Enzymes Recent Labs  Lab 12/31/17 0726 12/31/17 1001  TROPONINI 1.11* 1.09*   No results for input(s): TROPIPOC in the last 168 hours.  BNPNo results for input(s): BNP, PROBNP in the last 168 hours.  DDimer No results for  input(s): DDIMER in the last 168 hours.  Radiology/Studies:  Dg Chest 2 View  Result Date: 12/30/2017 CLINICAL DATA:  Right-sided abdominal pain and fever which started yesterday. EXAM: CHEST - 2 VIEW COMPARISON:  10/31/2014 FINDINGS: The cardiac silhouette is enlarged. Mediastinal contours appear intact. Calcific atherosclerotic disease and tortuosity of the aorta. There is no evidence of focal airspace consolidation, pleural effusion or pneumothorax. Low lung volumes with exaggerated interstitial markings. Osseous structures are without acute abnormality. Soft tissues are grossly normal. IMPRESSION: Enlarged cardiac silhouette. Calcific atherosclerotic disease and tortuosity of the aorta. No evidence of focal airspace consolidation. Electronically Signed   By: Fidela Salisbury M.D.   On: 12/30/2017 12:21   Ct Abdomen Pelvis W Contrast  Result Date: 12/30/2017 CLINICAL DATA:  RIGHT-sided abdominal pain and fever starting yesterday. EXAM: CT ABDOMEN AND PELVIS WITH CONTRAST TECHNIQUE: Multidetector CT imaging of the abdomen and pelvis was performed using the standard protocol following bolus administration of intravenous contrast. CONTRAST:  64mL OMNIPAQUE IOHEXOL 300 MG/ML  SOLN COMPARISON:  None. FINDINGS: Lower chest: Small consolidation at the RIGHT lung base, likely atelectasis. Hepatobiliary: Status post cholecystectomy with commensurate bile duct ectasia and pneumobilia. No focal mass or lesion within the liver. Pancreas: Unremarkable. No pancreatic ductal dilatation or surrounding inflammatory changes. Spleen: Normal in size without focal abnormality. Adrenals/Urinary Tract: Adrenal glands appear normal. Kidneys are unremarkable without mass, stone or hydronephrosis. No perinephric fluid. No ureteral or bladder calculi identified. Bladder is unremarkable. Stomach/Bowel: Appendix: Location: RIGHT lower quadrant Diameter: 1.4 cm Appendicolith: Present, measuring 8 mm. Mucosal hyper-enhancement: Not  convincing. Extraluminal gas: Suspected small foci of extraluminal air near the base of the appendix, highly suspicious for early perforation. Periappendiceal collection: No collection, but fairly large amount of periappendiceal inflammation/fluid stranding. No dilated large or small bowel loops. Fluid throughout the nondistended small bowel, with associated air-fluid levels indicating ileus. Vascular/Lymphatic: Prominent atherosclerosis of the thoracic aorta and upper abdominal aorta. No acute appearing vascular abnormality. Reproductive: Uterus and bilateral adnexa are unremarkable. Other: No abscess collection seen. Musculoskeletal: No acute or suspicious osseous lesion. Advanced degenerative spondylosis throughout the lumbar spine. IMPRESSION: 1. Acute appendicitis. Appendix is distended to approximately 1.4 cm. Appendicoliths present. Suspected small foci of extraluminal air near the base of the appendix, highly suspicious for early perforation. No abscess collection, but fairly large amount of periappendiceal inflammation/fluid stranding. 2. Fluid throughout the nondistended small bowel, with associated air-fluid levels indicating associated ileus. Aortic Atherosclerosis (ICD10-I70.0). These results were called by telephone at the time of interpretation on 12/30/2017 at 6:00 pm to Dr. Vanita Panda , who verbally acknowledged these results. Electronically Signed   By: Franki Cabot M.D.   On: 12/30/2017 18:02    Assessment and Plan:   Rebecca Rich is a 72 y.o. female with a hx of DM, HTN, HLD and obesity admitted with acute appendicitis s/p appendectomy who is being seen today for SVT and elevated troponin at the request of Dr. Hal Hope, Internal Medicine.   1. Elevated Troponin: troponin was checked given occurrence of SVT during the perioperative period. Initial troponin was 1.11. Repeat troponin 1.09. She denies CP. EKG shows no concerning ST/ Twave abnormalties. Suspect elevated troponin is likely  demand ischemia in the setting of recent GI illness and SVT. However will obtain a 2D Echo to assess LVEF and r/o WMAs. Continue to monitor closely on tele. Agree with potassium supplementation. Continue to monitor electrolytes. She does have multiple risk factors for CAD, including DM, HTN and HLD. Once she recovers from her recent surgery, may consider nuclear stress testing to screen for coronary ischemia.   2. SVT: in the setting of recent appendicitis w/ perforation w/ localized abscess, s/p appendectomy. Also hypokalemia. Suspect arrhthymias likely 2/2 acute GI illness. Continue post surgical management per general surgery and continue to monitor on tele. We will continue to follow along with you.    For questions or updates, please contact [place] Please consult [email or web address] for contact info under     Signed, Lyda Jester, PA-C  12/31/2017 10:49 AM   Pt seen and examined   I agree with findings as noted by B Simmons above Pt is a 72 yo with histroy of HTN, DM, HL   Presented with acute appendicities    Developed SVT in periop period   Now with increased troponin  Pt currently resting  Morbidly obese 72 yo in NAD  Denies CP   No prior history of CP     Neck is full Lungs are relativelty clear anteriorly Cardiac exam:   RRR   II/VI sysotlic murmur Abd  Decreased BS    Ext are without edema    EKG on 12/4   SVT  173 bpm   ST depression inferiorly Today   SR   First degree AV block       Troponin elev to 1.1  The troponin elevation most likely refects demand ischemia in the setting of SVT    Currently without CP   Volume status is not bad  Keep on tele.  Cycle enzymes Echo to evaluate LV and RV function  Dorris Carnes

## 2017-12-31 NOTE — Progress Notes (Signed)
Echocardiogram 2D Echocardiogram has been performed.  Rebecca Rich 12/31/2017, 3:28 PM

## 2018-01-01 ENCOUNTER — Inpatient Hospital Stay (HOSPITAL_COMMUNITY): Payer: Medicare Other

## 2018-01-01 DIAGNOSIS — R34 Anuria and oliguria: Secondary | ICD-10-CM

## 2018-01-01 LAB — URINE CULTURE: Culture: NO GROWTH

## 2018-01-01 LAB — BASIC METABOLIC PANEL
Anion gap: 11 (ref 5–15)
Anion gap: 11 (ref 5–15)
BUN: 43 mg/dL — ABNORMAL HIGH (ref 8–23)
BUN: 43 mg/dL — ABNORMAL HIGH (ref 8–23)
CHLORIDE: 110 mmol/L (ref 98–111)
CO2: 18 mmol/L — ABNORMAL LOW (ref 22–32)
CO2: 21 mmol/L — ABNORMAL LOW (ref 22–32)
CREATININE: 1.63 mg/dL — AB (ref 0.44–1.00)
Calcium: 8.2 mg/dL — ABNORMAL LOW (ref 8.9–10.3)
Calcium: 8.1 mg/dL — ABNORMAL LOW (ref 8.9–10.3)
Chloride: 108 mmol/L (ref 98–111)
Creatinine, Ser: 1.52 mg/dL — ABNORMAL HIGH (ref 0.44–1.00)
GFR calc Af Amer: 36 mL/min — ABNORMAL LOW (ref >60)
GFR calc Af Amer: 39 mL/min — ABNORMAL LOW (ref >60)
GFR calc non Af Amer: 31 mL/min — ABNORMAL LOW (ref >60)
GFR calc non Af Amer: 33 mL/min — ABNORMAL LOW (ref >60)
Glucose, Bld: 190 mg/dL — ABNORMAL HIGH (ref 70–99)
Glucose, Bld: 160 mg/dL — ABNORMAL HIGH (ref 70–99)
POTASSIUM: 4.2 mmol/L (ref 3.5–5.1)
Potassium: 3.9 mmol/L (ref 3.5–5.1)
Sodium: 139 mmol/L (ref 135–145)
Sodium: 140 mmol/L (ref 135–145)

## 2018-01-01 LAB — URINALYSIS, ROUTINE W REFLEX MICROSCOPIC
Bilirubin Urine: NEGATIVE
GLUCOSE, UA: NEGATIVE mg/dL
Hgb urine dipstick: NEGATIVE
KETONES UR: NEGATIVE mg/dL
Leukocytes, UA: NEGATIVE
Nitrite: NEGATIVE
Protein, ur: 30 mg/dL — AB
Specific Gravity, Urine: 1.032 — ABNORMAL HIGH (ref 1.005–1.030)
pH: 5 (ref 5.0–8.0)

## 2018-01-01 LAB — CBC
HCT: 41.9 % (ref 36.0–46.0)
HEMOGLOBIN: 13.2 g/dL (ref 12.0–15.0)
MCH: 30 pg (ref 26.0–34.0)
MCHC: 31.5 g/dL (ref 30.0–36.0)
MCV: 95.2 fL (ref 80.0–100.0)
Platelets: 126 10*3/uL — ABNORMAL LOW (ref 150–400)
RBC: 4.4 MIL/uL (ref 3.87–5.11)
RDW: 13.9 % (ref 11.5–15.5)
WBC: 11.7 10*3/uL — ABNORMAL HIGH (ref 4.0–10.5)
nRBC: 0 % (ref 0.0–0.2)

## 2018-01-01 LAB — CREATININE, URINE, RANDOM: Creatinine, Urine: 257.54 mg/dL

## 2018-01-01 LAB — GLUCOSE, CAPILLARY
Glucose-Capillary: 177 mg/dL — ABNORMAL HIGH (ref 70–99)
Glucose-Capillary: 190 mg/dL — ABNORMAL HIGH (ref 70–99)
Glucose-Capillary: 155 mg/dL — ABNORMAL HIGH (ref 70–99)
Glucose-Capillary: 147 mg/dL — ABNORMAL HIGH (ref 70–99)

## 2018-01-01 LAB — NA AND K (SODIUM & POTASSIUM), RAND UR
Potassium Urine: 64 mmol/L
Sodium, Ur: <10 mmol/L

## 2018-01-01 LAB — MAGNESIUM: Magnesium: 2.3 mg/dL (ref 1.7–2.4)

## 2018-01-01 MED ORDER — SODIUM CHLORIDE 0.9 % IV BOLUS
1000.0000 mL | Freq: Once | INTRAVENOUS | Status: AC
  Administered 2018-01-01: 1000 mL via INTRAVENOUS

## 2018-01-01 MED ORDER — ORAL CARE MOUTH RINSE
15.0000 mL | Freq: Two times a day (BID) | OROMUCOSAL | Status: DC
  Administered 2018-01-01 – 2018-01-09 (×5): 15 mL via OROMUCOSAL

## 2018-01-01 NOTE — Progress Notes (Signed)
PROGRESS NOTE    Rebecca Rich  RCV:[id number] DOB: Dec 14, 1945 DOA: 12/30/2017 PCP: Burnis Medin, MD   Brief Narrative:  The patient is 72 year old female with history of morbid obesity, sleep apnea, hypertension, diabetes mellitus type 2 presents to the ER because of 2 days of abdominal pain mostly in the lower quadrants with nausea.  Pain has been worsening with increased on minimal movement.  Had gone to urgent care and was referred to the ER.  In the ER CT of the abdomen and pelvis done shows possible perforated appendicitis and was taken to the OR.  Before taken to the OR patient had a brief run of SVT and was placed on Cardizem infusion.  Following the surgery patient's heart rate is in the normal range sinus.  Medicine was consulted for assistance and medical management.   Assessment & Plan:   Active Problems:   Diabetes mellitus type 2 in obese (HCC)   Morbid obesity (HCC)   OSA on CPAP   Hypothyroid   Acute appendicitis with perforation and localized peritonitis   SVT (supraventricular tachycardia) (HCC)  Diabetes mellitus type 2  -We will keep patient on sliding scale coverage.  Closely follow CBGs. -CBG's ranging from 155-190  Hypertension  -We will keep patient on PRN IV hydralazine and hold off antihypertensives due to low normal blood pressure initially. -Avoid Hypotension  Sleep Apnea  -C/w  CPAP  Possible developing sepsis from perforated appendix and possible developing ileus  --appreciate general surgery input patient is on Zosyn and clear liquids.   -Continue to monitor closely.  Follow lactate.  Hypothyroidism  -C/w Synthroid  Brief run of SVT  -Which improved with IV Cardizem.   -Presently in sinus rhythm.  Will check TSH and troponin  Elevated Troponin -Suspect Demand ischemia in the Setting of SVT and Perforated Appendicitis as patient has denied any CP -Troponin Trending Down -Cardiology consulted and recommending ECHO -ECHOCardiogram done and  showed Normal EF of 60-65% and G1DD with Normal Wall motion and no wall motion abnormalities  Super Morbid Obesity -Estimated body mass index is 56.28 kg/m as calculated from the following:   Height as of this encounter: 5\' 1"  (1.549 m).   Weight as of this encounter: 135.1 kg. -Weight Loss Counseling Given  Poor Urine Output -Foley placed for Measurement and Strict I's/O's -Bolused 3 Liters of NS since last night -C/w IV Maintenance Fluide of NS + 30 mEQ of KCl at 100 mL/hr -Renal U/S Normal -Cr slightly bumped since yesterday but improved back to 1.52 -? Mediated by SVT and Surgery -If Cr worsening will obtain a Renal Consultation  -Continue to Monitor Carefully and exteremly closely -Check UA and Urine Cx   DVT prophylaxis: Enoxaparin per Primary Code Status: FULL CODE Family Communication: Discussed with Friend at bedside Disposition Plan: Per Primary Team  Consultants:   [facility]  Cardiology   Procedures:  Renal U/S  ECHOCARDIOGRAM ------------------------------------------------------------------- Study Conclusions  - Left ventricle: The cavity size was normal. There was moderate   concentric hypertrophy. Systolic function was normal. The   estimated ejection fraction was in the range of 60% to 65%. Wall   motion was normal; there were no regional wall motion   abnormalities. Doppler parameters are consistent with abnormal   left ventricular relaxation (grade 1 diastolic dysfunction).   Doppler parameters are consistent with elevated ventricular   end-diastolic filling pressure. - Aortic valve: Trileaflet; moderately thickened, moderately   calcified leaflets. 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 There was moderate  regurgitation. Mean gradient (S): 10 mm Hg. Peak gradient (S): 19   mm Hg. Valve area (VTI): 2.44 cm^2. Valve area (Vmax): 2.54 cm^2.   Valve area (Vmean): 2.63 cm^2. - Mitral valve: Calcified annulus. Mildly thickened leaflets .   There was no  regurgitation. - Right ventricle: The cavity size was mildly dilated. Wall   thickness was normal. Systolic function was mildly reduced. - Tricuspid valve: There was mild regurgitation. - Pulmonary arteries: Systolic pressure was within the normal   range. - Pericardium, extracardiac: There was no pericardial effusion.  Antimicrobials:  Anti-infectives (From admission, onward)   Start     Dose/Rate Route Frequency Ordered Stop   12/31/17 0100  piperacillin-tazobactam (ZOSYN) IVPB 3.375 g     3.375 g 12.5 mL/hr over 240 Minutes Intravenous Every 8 hours 12/31/17 0055     12/30/17 1045  ceFEPIme (MAXIPIME) 2 g in sodium chloride 0.9 % 100 mL IVPB     2 g 200 mL/hr over 30 Minutes Intravenous  Once 12/30/17 1044 12/30/17 1342   12/30/17 1045  metroNIDAZOLE (FLAGYL) IVPB 500 mg  Status:  Discontinued     500 mg 100 mL/hr over 60 Minutes Intravenous Every 8 hours 12/30/17 1044 12/31/17 0055     Subjective: Seen and Examined this morning states that she was having some abdominal pain which is slightly worse than yesterday.  No nausea or vomiting.  States that she drank some apple juice which did not bring with her.  No other concerns or complaints at this time and feels okay but wanted to sleep.  Objective: Vitals:   01/01/18 0900 01/01/18 1200 01/01/18 1600 01/01/18 1700  BP: (!) 130/52 (!) 123/52  (!) 149/49  Pulse: 84 89  82  Resp: (!) 31 (!) 25  (!) 21  Temp:  98.1 F (36.7 C) 98.2 F (36.8 C)   TempSrc:  Oral Oral   SpO2: 94% 93%  90%  Weight:      Height:        Intake/Output Summary (Last 24 hours) at 01/01/2018 1752 Last data filed at 01/01/2018 1700 Gross per 24 hour  Intake 4881.86 ml  Output 575 ml  Net 4306.86 ml   Filed Weights   12/30/17 1018 01/01/18 0700  Weight: 126.6 kg 135.1 kg   Examination: Physical Exam:  Constitutional: WN/WD morbidly obese female NAD and appears calm and comfortable Eyes: Llids and conjunctivae normal, sclerae anicteric  ENMT:  External Ears, Nose appear normal. Grossly normal hearing.  Neck: Appears normal, supple, no cervical masses, normal ROM, no appreciable thyromegaly; no JVD Respiratory: Diminished to auscultation bilaterally, no wheezing, rales, rhonchi or crackles. Normal respiratory effort and patient is not tachypenic. No accessory muscle use.  Cardiovascular: RRR, no murmurs / rubs / gallops. S1 and S2 auscultated.  Abdomen: Soft, non-tender, Distended 2/2 body habitus. No masses palpated. No appreciable hepatosplenomegaly. Bowel sounds positive x4.  JP drain in Left Abdomen GU: Deferred. Musculoskeletal: No clubbing / cyanosis of digits/nails. No joint deformity upper and lower extremities.  Skin: No rashes, lesions, ulcers on a limited skin evaluation. No induration; Warm and dry.  Neurologic: CN 2-12 grossly intact with no focal deficits. Romberg sign and cerebellar reflexes not assessed.  Psychiatric: Normal judgment and insight. Alert and oriented x 3. Normal mood and appropriate affect.   Data Reviewed: I have personally reviewed following labs and imaging studies  CBC: Recent Labs  Lab 12/30/17 1101 12/31/17 0314 01/01/18 0328  WBC 15.1* 12.6* 11.7*  NEUTROABS 14.2*  --   --  HGB 15.1* 12.5 13.2  HCT 46.9* 39.5 41.9  MCV 93.6 95.4 95.2  PLT 147* 120* 102*   Basic Metabolic Panel: Recent Labs  Lab 12/30/17 1059 12/31/17 0314 01/01/18 0328 01/01/18 1630  NA 140 140 139 140  K 3.4* 3.4* 3.9 4.2  CL 103 108 110 108  CO2 22 22 18* 21*  GLUCOSE 230* 258* 190* 160*  BUN 31* 34* 43* 43*  CREATININE 1.58* 1.49* 1.63* 1.52*  CALCIUM 9.1 7.8* 8.2* 8.1*  MG  --  1.5* 2.3  --    GFR: Estimated Creatinine Clearance: 42.4 mL/min (A) (by C-G formula based on SCr of 1.52 mg/dL (H)). Liver Function Tests: Recent Labs  Lab 12/30/17 1059  AST 31  ALT 17  ALKPHOS 70  BILITOT 1.7*  PROT 7.9  ALBUMIN 4.0   Recent Labs  Lab 12/30/17 1059  LIPASE 37   No results for input(s): AMMONIA  in the last 168 hours. Coagulation Profile: No results for input(s): INR, PROTIME in the last 168 hours. Cardiac Enzymes: Recent Labs  Lab 12/31/17 0726 12/31/17 1001 12/31/17 1556 12/31/17 2106  TROPONINI 1.11* 1.09* 0.75* 0.75*   BNP (last 3 results) No results for input(s): PROBNP in the last 8760 hours. HbA1C: Recent Labs    12/31/17 0314  HGBA1C 7.0*   CBG: Recent Labs  Lab 12/31/17 1702 12/31/17 2102 01/01/18 0734 01/01/18 1204 01/01/18 1606  GLUCAP 168* 172* 177* 190* 155*   Lipid Profile: No results for input(s): CHOL, HDL, LDLCALC, TRIG, CHOLHDL, LDLDIRECT in the last 72 hours. Thyroid Function Tests: Recent Labs    12/31/17 0726  TSH 1.696   Anemia Panel: No results for input(s): VITAMINB12, FOLATE, FERRITIN, TIBC, IRON, RETICCTPCT in the last 72 hours. Sepsis Labs: Recent Labs  Lab 12/30/17 1106 12/30/17 1305 12/31/17 0726 12/31/17 1001  LATICACIDVEN 3.97* 2.95* 2.8* 2.3*    Recent Results (from the past 240 hour(s))  Urine culture     Status: None   Collection Time: 12/30/17  1:46 AM  Result Value Ref Range Status   Specimen Description   Final    URINE, RANDOM Performed at [facility] [address] [place], [place] [address]    Special Requests   Final    NONE Performed at [facility], [place] [address], [place], [place] [address]    Culture   Final    NO GROWTH Performed at [facility], [place] [address] [place], [place] [address]    Report Status 01/01/2018 FINAL  Final  Blood Culture (routine x 2)     Status: None (Preliminary result)   Collection Time: 12/30/17 11:01 AM  Result Value Ref Range Status   Specimen Description   Final    RIGHT ANTECUBITAL Performed at [facility] [address] [place], [place] [address]    Special Requests   Final    Blood Culture adequate volume BOTTLES DRAWN AEROBIC AND ANAEROBIC   Culture   Final    NO GROWTH 2  DAYS Performed at [facility], [place] [address], [place], [place] [address]    Report Status PENDING  Incomplete  Blood Culture (routine x 2)     Status: None (Preliminary result)   Collection Time: 12/30/17 11:01 AM  Result Value Ref Range Status   Specimen Description   Final    BLOOD LEFT HAND Performed at [facility] [address], [place], [place] [address]    Special  Requests   Final    Blood Culture results may not be optimal due to an inadequate volume of blood received in culture bottles BOTTLES DRAWN AEROBIC AND ANAEROBIC   Culture   Final    NO GROWTH 2 DAYS Performed at [facility], [place] [address], [place], [place] [address]    Report Status PENDING  Incomplete  MRSA PCR Screening     Status: None   Collection Time: 12/31/17 12:53 AM  Result Value Ref Range Status   MRSA by PCR NEGATIVE NEGATIVE Final    Comment:        The GeneXpert MRSA Assay (FDA approved for NASAL specimens only), is one component of a comprehensive MRSA colonization surveillance program. It is not intended to diagnose MRSA infection nor to guide or monitor treatment for MRSA infections. Performed at [facility], [place] [address], [place], [place] [address]     Radiology Studies: US Renal  Result Date: 01/01/2018 CLINICAL DATA:  Acute kidney injury EXAM: RENAL / URINARY TRACT ULTRASOUND COMPLETE COMPARISON:  None. FINDINGS: Right Kidney: Renal measurements: 11.5 x 5.3 x 6.4 cm = volume: 202 mL . Echogenicity within normal limits. No mass or hydronephrosis visualized. Left Kidney: Renal measurements: 12.3 x 6 x 4.5 cm = volume: 174 mL. Echogenicity within normal limits. No mass or hydronephrosis visualized. Bladder: Bladder not seen, presumably decompressed. IMPRESSION: Normal renal ultrasound. Electronically Signed   By: Franki Cabot M.D.   On: 01/01/2018 09:58   Dg Chest Port 1v Same Day  Result Date: 12/31/2017 CLINICAL DATA:  Shortness  of breath. EXAM: PORTABLE CHEST 1 VIEW COMPARISON:  Radiograph of December 30, 2017. FINDINGS: Stable cardiomegaly is noted with central pulmonary vascular congestion. Atherosclerosis of thoracic aorta is noted. No pneumothorax or pleural effusion is noted. No consolidative process is noted. Bony thorax is unremarkable. IMPRESSION: Stable cardiomegaly with central pulmonary vascular congestion. Aortic Atherosclerosis (ICD10-I70.0). Electronically Signed   By: Marijo Conception, M.D.   On: 12/31/2017 10:53   Scheduled Meds: . enoxaparin (LOVENOX) injection  40 mg Subcutaneous Q24H  . gabapentin  300 mg Oral BID  . insulin aspart  0-20 Units Subcutaneous TID WC  . levothyroxine  75 mcg Oral Q0600  . mouth rinse  15 mL Mouth Rinse BID  . potassium chloride  10 mEq Oral TID  . senna  1 tablet Oral BID   Continuous Infusions: . diltiazem (CARDIZEM) infusion 5 mg/hr (12/30/17 1355)  . piperacillin-tazobactam (ZOSYN)  IV 12.5 mL/hr at 01/01/18 1700  . 0.9 % sodium chloride with kcl 100 mL/hr at 01/01/18 1700  . sodium chloride 1,000 mL (01/01/18 1700)    LOS: 2 days   Kerney Elbe, DO [facility] PAGER is on [place]  If 7PM-7AM, please contact night-coverage [email or web address] Password [email or web address] 01/01/2018, 5:52 PM

## 2018-01-01 NOTE — Progress Notes (Signed)
Pt. Has had little urine output today. MD has been made aware and new orders given. Foley placed and will continue to monitor patient.

## 2018-01-01 NOTE — Progress Notes (Signed)
2 Days Post-Op    CC: Right abdominal pain  Subjective: She is up in the chair, complaining of abdominal discomfort but looks fairly comfortable.  No further SVT, no chest pain, no complaints of shortness of breath. Objective: Vital signs in last 24 hours: Temp:  [97.7 F (36.5 C)-99.8 F (37.7 C)] 97.7 F (36.5 C) (12/06 0800) Pulse Rate:  [80-101] 82 (12/06 0800) Resp:  [16-45] 29 (12/06 0800) BP: (117-143)/(35-87) 126/55 (12/06 0800) SpO2:  [89 %-98 %] 96 % (12/06 0800) Weight:  [135.1 kg] 135.1 kg (12/06 0700) Last BM Date: 12/28/17 360 p.o. 3500 IV 550 urine Drain 95 Afebrile vital signs are stable Creatinine 1.58>> 1.49>> 1.63 today Troponin  1.11 >>1.09 >> 0.75 >> 0.75   Intake/Output from previous day: 12/05 0701 - 12/06 0700 In: 4088.9 [P.O.:360; I.V.:2808.6; IV Piggyback:920.3] Out: 645 [Urine:550; Drains:95] Intake/Output this shift: No intake/output data recorded.  General appearance: alert, cooperative, no distress and Uncomfortable, but resting Resp: clear to auscultation bilaterally GI: Large abdomen, port sites all look good.  JP drainage is serosanguineous and clear.  No bowel sounds, no flatus  Lab Results:  Recent Labs    12/31/17 0314 01/01/18 0328  WBC 12.6* 11.7*  HGB 12.5 13.2  HCT 39.5 41.9  PLT 120* 126*    BMET Recent Labs    12/31/17 0314 01/01/18 0328  NA 140 139  K 3.4* 3.9  CL 108 110  CO2 22 18*  GLUCOSE 258* 190*  BUN 34* 43*  CREATININE 1.49* 1.63*  CALCIUM 7.8* 8.2*   PT/INR No results for input(s): LABPROT, INR in the last 72 hours.  Recent Labs  Lab 12/30/17 1059  AST 31  ALT 17  ALKPHOS 70  BILITOT 1.7*  PROT 7.9  ALBUMIN 4.0     Lipase     Component Value Date/Time   LIPASE 37 12/30/2017 1059     Medications: . enoxaparin (LOVENOX) injection  40 mg Subcutaneous Q24H  . gabapentin  300 mg Oral BID  . insulin aspart  0-20 Units Subcutaneous TID WC  . levothyroxine  75 mcg Oral Q0600  . mouth  rinse  15 mL Mouth Rinse BID  . potassium chloride  10 mEq Oral TID  . senna  1 tablet Oral BID    Assessment/Plan Hypertension AKI - new 1.04 (08/04/17)  1.58 >> 1.49 >> 1.63 this AM/UO 550 for last 24 hours.   Type 2 diabetes Sleep apnea on CPAP Hypothyroid Morbid obesity    Sepsis Acute appendicitis perforation/localized abscess Laparoscopic appendectomy, drain placement, 12/30/2017 Dr. Stark Klein. Postop SVT placed on Cardizem/postiive Troponin 1.11 >>1.09 >> 0.75 >> 0.75 Hx of Mild AR/AS, grade II diastolic dysfunction, EF 40% 12/2014  FEN: IV fluids/Clears ID: [id number]/Flagyl [id number] >> Zosyn 12/5 =>>day 2 DVT: Lovenox Follow-up: TBD   Plan: Continue IV antibiotics, continue IV fluids..  Cardiology is following and troponins are trending down, no further SVT.  Dr. Chana Bode following and getting Renal US.    LOS: 2 days    Rebakah Cokley 01/01/2018 [phone]

## 2018-01-01 NOTE — Progress Notes (Signed)
Progress Note  Patient Name: Rebecca Rich Date of Encounter: 01/01/2018  Primary Cardiologist: New  Subjective   No CP   No SOBat rest  Complains of abdominal discomfort  Inpatient Medications    Scheduled Meds: . enoxaparin (LOVENOX) injection  40 mg Subcutaneous Q24H  . gabapentin  300 mg Oral BID  . insulin aspart  0-20 Units Subcutaneous TID WC  . levothyroxine  75 mcg Oral Q0600  . mouth rinse  15 mL Mouth Rinse BID  . potassium chloride  10 mEq Oral TID  . senna  1 tablet Oral BID   Continuous Infusions: . diltiazem (CARDIZEM) infusion 5 mg/hr (12/30/17 1355)  . piperacillin-tazobactam (ZOSYN)  IV Stopped (01/01/18 5643)  . 0.9 % sodium chloride with kcl 100 mL/hr at 01/01/18 1300   PRN Meds: acetaminophen **OR** acetaminophen, diphenhydrAMINE **OR** diphenhydrAMINE, hydrALAZINE, HYDROmorphone (DILAUDID) injection, methocarbamol, ondansetron (ZOFRAN) IV, oxyCODONE, prochlorperazine **OR** prochlorperazine, simethicone, traMADol   Vital Signs    Vitals:   01/01/18 0700 01/01/18 0800 01/01/18 0900 01/01/18 1200  BP: (!) 141/45 (!) 126/55 (!) 130/52 (!) 123/52  Pulse: 80 82 84 89  Resp: (!) 22 (!) 29 (!) 31 (!) 25  Temp:  97.7 F (36.5 C)  98.1 F (36.7 C)  TempSrc:  Axillary  Oral  SpO2: 96% 96% 94% 93%  Weight: 135.1 kg     Height:        Intake/Output Summary (Last 24 hours) at 01/01/2018 1436 Last data filed at 01/01/2018 1300 Gross per 24 hour  Intake 4274.66 ml  Output 320 ml  Net 3954.66 ml   Filed Weights   12/30/17 1018 01/01/18 0700  Weight: 126.6 kg 135.1 kg    Telemetry    SR   - Personally Reviewed  ECG      Physical Exam   GEN: No acute distress.   Neck: Neck full   Cardiac: RRR, no murmurs, rubs, or gallops.  Respiratory:   Relatively clear antioerly   . GI: Sl distended   Deferred   MS: No edema; No deformity. Neuro:  Nonfocal  Psych: Normal affect   Labs    Chemistry Recent Labs  Lab 12/30/17 1059 12/31/17 0314  01/01/18 0328  NA 140 140 139  K 3.4* 3.4* 3.9  CL 103 108 110  CO2 22 22 18*  GLUCOSE 230* 258* 190*  BUN 31* 34* 43*  CREATININE 1.58* 1.49* 1.63*  CALCIUM 9.1 7.8* 8.2*  PROT 7.9  --   --   ALBUMIN 4.0  --   --   AST 31  --   --   ALT 17  --   --   ALKPHOS 70  --   --   BILITOT 1.7*  --   --   GFRNONAA 32* 34* 31*  GFRAA 37* 40* 36*  ANIONGAP 15 10 11      Hematology Recent Labs  Lab 12/30/17 1101 12/31/17 0314 01/01/18 0328  WBC 15.1* 12.6* 11.7*  RBC 5.01 4.14 4.40  HGB 15.1* 12.5 13.2  HCT 46.9* 39.5 41.9  MCV 93.6 95.4 95.2  MCH 30.1 30.2 30.0  MCHC 32.2 31.6 31.5  RDW 13.5 13.8 13.9  PLT 147* 120* 126*    Cardiac Enzymes Recent Labs  Lab 12/31/17 0726 12/31/17 1001 12/31/17 1556 12/31/17 2106  TROPONINI 1.11* 1.09* 0.75* 0.75*   No results for input(s): TROPIPOC in the last 168 hours.   BNPNo results for input(s): BNP, PROBNP in the last 168 hours.  DDimer No results for input(s): DDIMER in the last 168 hours.   Radiology    Ct Abdomen Pelvis W Contrast  Result Date: 12/30/2017 CLINICAL DATA:  RIGHT-sided abdominal pain and fever starting yesterday. EXAM: CT ABDOMEN AND PELVIS WITH CONTRAST TECHNIQUE: Multidetector CT imaging of the abdomen and pelvis was performed using the standard protocol following bolus administration of intravenous contrast. CONTRAST:  9mL OMNIPAQUE IOHEXOL 300 MG/ML  SOLN COMPARISON:  None. FINDINGS: Lower chest: Small consolidation at the RIGHT lung base, likely atelectasis. Hepatobiliary: Status post cholecystectomy with commensurate bile duct ectasia and pneumobilia. No focal mass or lesion within the liver. Pancreas: Unremarkable. No pancreatic ductal dilatation or surrounding inflammatory changes. Spleen: Normal in size without focal abnormality. Adrenals/Urinary Tract: Adrenal glands appear normal. Kidneys are unremarkable without mass, stone or hydronephrosis. No perinephric fluid. No ureteral or bladder calculi  identified. Bladder is unremarkable. Stomach/Bowel: Appendix: Location: RIGHT lower quadrant Diameter: 1.4 cm Appendicolith: Present, measuring 8 mm. Mucosal hyper-enhancement: Not convincing. Extraluminal gas: Suspected small foci of extraluminal air near the base of the appendix, highly suspicious for early perforation. Periappendiceal collection: No collection, but fairly large amount of periappendiceal inflammation/fluid stranding. No dilated large or small bowel loops. Fluid throughout the nondistended small bowel, with associated air-fluid levels indicating ileus. Vascular/Lymphatic: Prominent atherosclerosis of the thoracic aorta and upper abdominal aorta. No acute appearing vascular abnormality. Reproductive: Uterus and bilateral adnexa are unremarkable. Other: No abscess collection seen. Musculoskeletal: No acute or suspicious osseous lesion. Advanced degenerative spondylosis throughout the lumbar spine. IMPRESSION: 1. Acute appendicitis. Appendix is distended to approximately 1.4 cm. Appendicoliths present. Suspected small foci of extraluminal air near the base of the appendix, highly suspicious for early perforation. No abscess collection, but fairly large amount of periappendiceal inflammation/fluid stranding. 2. Fluid throughout the nondistended small bowel, with associated air-fluid levels indicating associated ileus. Aortic Atherosclerosis (ICD10-I70.0). These results were called by telephone at the time of interpretation on 12/30/2017 at 6:00 pm to Dr. Vanita Panda , who verbally acknowledged these results. Electronically Signed   By: Franki Cabot M.D.   On: 12/30/2017 18:02   US Renal  Result Date: 01/01/2018 CLINICAL DATA:  Acute kidney injury EXAM: RENAL / URINARY TRACT ULTRASOUND COMPLETE COMPARISON:  None. FINDINGS: Right Kidney: Renal measurements: 11.5 x 5.3 x 6.4 cm = volume: 202 mL . Echogenicity within normal limits. No mass or hydronephrosis visualized. Left Kidney: Renal measurements:  12.3 x 6 x 4.5 cm = volume: 174 mL. Echogenicity within normal limits. No mass or hydronephrosis visualized. Bladder: Bladder not seen, presumably decompressed. IMPRESSION: Normal renal ultrasound. Electronically Signed   By: Franki Cabot M.D.   On: 01/01/2018 09:58   Dg Chest Port 1v Same Day  Result Date: 12/31/2017 CLINICAL DATA:  Shortness of breath. EXAM: PORTABLE CHEST 1 VIEW COMPARISON:  Radiograph of December 30, 2017. FINDINGS: Stable cardiomegaly is noted with central pulmonary vascular congestion. Atherosclerosis of thoracic aorta is noted. No pneumothorax or pleural effusion is noted. No consolidative process is noted. Bony thorax is unremarkable. IMPRESSION: Stable cardiomegaly with central pulmonary vascular congestion. Aortic Atherosclerosis (ICD10-I70.0). Electronically Signed   By: Marijo Conception, M.D.   On: 12/31/2017 10:53    Cardiac Studies   Echo 01/03/18  ------------------------------------------------------------------- LV EF: 60% -   65%  ------------------------------------------------------------------- History:   PMH:  s/p Appendectomy, SVT, Obesity. Elevated Troponin.  Risk factors:  Hypertension. Diabetes mellitus. Dyslipidemia.  ------------------------------------------------------------------- Study Conclusions  - Left ventricle: The cavity size was normal. There was moderate  concentric hypertrophy. Systolic function was normal. The   estimated ejection fraction was in the range of 60% to 65%. Wall   motion was normal; there were no regional wall motion   abnormalities. Doppler parameters are consistent with abnormal   left ventricular relaxation (grade 1 diastolic dysfunction).   Doppler parameters are consistent with elevated ventricular   end-diastolic filling pressure. - Aortic valve: Trileaflet; moderately thickened, moderately   calcified leaflets. 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 There was moderate   regurgitation. Mean gradient (S): 10 mm Hg. Peak  gradient (S): 19   mm Hg. Valve area (VTI): 2.44 cm^2. Valve area (Vmax): 2.54 cm^2.   Valve area (Vmean): 2.63 cm^2. - Mitral valve: Calcified annulus. Mildly thickened leaflets .   There was no regurgitation. - Right ventricle: The cavity size was mildly dilated. Wall   thickness was normal. Systolic function was mildly reduced. - Tricuspid valve: There was mild regurgitation. - Pulmonary arteries: Systolic pressure was within the normal   range. - Pericardium, extracardiac: There was no pericardial effusion.  ------------------------------------------------------------------- Study data:  Comparison was made to the study of 12/28/2014.  Study status:  Routine.  Procedure:  The patient reported no pain pre or post test. Transthoracic echocardiography. Image quality was adequate. The study was technically difficult, as a result of poor sound wave transmission and body habitus. Intravenous contrast (Definity) was administered.  Study completion:  There were no complications.          Transthoracic echocardiography.  M-mode, complete 2D, spectral Doppler, and color Doppler.  Birthdate: Patient birthdate: [birth year].  Age:  Patient is 72 yr old.  Sex: Gender: female.    BMI: 52.7 kg/m^2.  Blood pressure:     128/44 Patient status:  Inpatient.  Study date:  Study date: 12/31/2017. Study time: 02:51 PM.  Location:  Bedside.  -------------------------------------------------------------------  ------------------------------------------------------------------- Left ventricle:  The cavity size was normal. There was moderate concentric hypertrophy. Systolic function was normal. The estimated ejection fraction was in the range of 60% to 65%. Wall motion was normal; there were no regional wall motion abnormalities. Doppler parameters are consistent with abnormal left ventricular relaxation (grade 1 diastolic dysfunction). Doppler parameters are consistent with elevated ventricular  end-diastolic filling pressure.  ------------------------------------------------------------------- Aortic valve:   Trileaflet; moderately thickened, moderately calcified leaflets. Mobility was not restricted.  Doppler:   There was mild stenosis.   There was moderate regurgitation.    VTI ratio of LVOT to aortic valve: 0.86. Valve area (VTI): 2.44 cm^2. Indexed valve area (VTI): 1.01 cm^2/m^2. Peak velocity ratio of LVOT to aortic valve: 0.89. Valve area (Vmax): 2.54 cm^2. Indexed valve area (Vmax): 1.05 cm^2/m^2. Mean velocity ratio of LVOT to aortic valve: 0.93. Valve area (Vmean): 2.63 cm^2. Indexed valve area (Vmean): 1.09 cm^2/m^2.    Mean gradient (S): 10 mm Hg. Peak gradient (S): 19 mm Hg.  ------------------------------------------------------------------- Aorta:  Aortic root: The aortic root was normal in size.  ------------------------------------------------------------------- Mitral valve:   Calcified annulus. Mildly thickened leaflets . Mobility was not restricted.  Doppler:  Transvalvular velocity was within the normal range. There was no evidence for stenosis. There was no regurgitation.    Valve area by pressure half-time: 6.29 cm^2. Indexed valve area by pressure half-time: 2.6 cm^2/m^2.  ------------------------------------------------------------------- Left atrium:  The atrium was normal in size.  ------------------------------------------------------------------- Right ventricle:  The cavity size was mildly dilated. Wall thickness was normal. Systolic function was mildly reduced.  ------------------------------------------------------------------- Pulmonic valve:    Structurally normal valve.   Cusp  separation was normal.  Doppler:  Transvalvular velocity was within the normal range. There was no evidence for stenosis. There was no regurgitation.  ------------------------------------------------------------------- Tricuspid valve:   Structurally  normal valve.    Doppler: Transvalvular velocity was within the normal range. There was mild regurgitation.  ------------------------------------------------------------------- Pulmonary artery:   The main pulmonary artery was normal-sized. Systolic pressure was within the normal range.  ------------------------------------------------------------------- Right atrium:  The atrium was normal in size.  ------------------------------------------------------------------- Pericardium:  There was no pericardial effusion.  ------------------------------------------------------------------- Systemic veins:  Not visualized.  ------------------------------------------------------------------- Measurements   Left ventricle                           Value          Reference  LV ID, ED, PLAX chordal          (L)     36.9  mm       43 - 52  LV ID, ES, PLAX chordal                  25.5  mm       23 - 38  LV fx shortening, PLAX chordal           31    %        >=29  LV PW thickness, ED                      12.1  mm       ----------  IVS/LV PW ratio, ED                      1.25           <=1.3  Stroke volume, 2D                        89    ml       ----------  Stroke volume/bsa, 2D                    37    ml/m^2   ----------  LV e&', lateral                           11.2  cm/s     ----------  LV E/e&', lateral                         5.81           ----------  LV e&', medial                            4.79  cm/s     ----------  LV E/e&', medial                          13.59          ----------  LV e&', average                           8     cm/s     ----------  LV E/e&', average  8.14           ----------    Ventricular septum                       Value          Reference  IVS thickness, ED                        15.1  mm       ----------    LVOT                                     Value          Reference  LVOT ID, S                               19    mm        ----------  LVOT area                                2.84  cm^2     ----------  LVOT peak velocity, S                    186   cm/s     ----------  LVOT mean velocity, S                    127   cm/s     ----------  LVOT VTI, S                              31.3  cm       ----------  LVOT peak gradient, S                    14    mm Hg    ----------    Aortic valve                             Value          Reference  Aortic valve peak velocity, S            208   cm/s     ----------  Aortic valve mean velocity, S            137   cm/s     ----------  Aortic valve VTI, S                      36.4  cm       ----------  Aortic mean gradient, S                  10    mm Hg    ----------  Aortic peak gradient, S                  17    mm Hg    ----------  VTI ratio, LVOT/AV                       0.86           ----------  Aortic valve area, VTI                   2.44  cm^2     ----------  Aortic valve area/bsa, VTI               1.01  cm^2/m^2 ----------  Velocity ratio, peak, LVOT/AV            0.89           ----------  Aortic valve area, peak velocity         2.54  cm^2     ----------  Aortic valve area/bsa, peak              1.05  cm^2/m^2 ----------  velocity  Velocity ratio, mean, LVOT/AV            0.93           ----------  Aortic valve area, mean velocity         2.63  cm^2     ----------  Aortic valve area/bsa, mean              1.09  cm^2/m^2 ----------  velocity  Aortic regurg pressure half-time         429   ms       ----------    Aorta                                    Value          Reference  Aortic root ID, ED                       34    mm       ----------    Left atrium                              Value          Reference  LA ID, A-P, ES                           37    mm       ----------  LA ID/bsa, A-P                           1.53  cm/m^2   <=2.2  LA volume, S                             56.2  ml       ----------  LA volume/bsa, S                         23.2   ml/m^2   ----------  LA volume, ES, 1-p A4C                   52.1  ml       ----------  LA volume/bsa, ES, 1-p A4C               21.5  ml/m^2   ----------  LA volume, ES, 1-p A2C                   58.4  ml       ----------  LA volume/bsa, ES, 1-p A2C               24.1  ml/m^2   ----------    Mitral valve                             Value          Reference  Mitral E-wave peak velocity              65.1  cm/s     ----------  Mitral A-wave peak velocity              69.5  cm/s     ----------  Mitral deceleration time         (L)     120   ms       150 - 230  Mitral pressure half-time                35    ms       ----------  Mitral E/A ratio, peak                   0.9            ----------  Mitral valve area, PHT, DP               6.29  cm^2     ----------  Mitral valve area/bsa, PHT, DP           2.6   cm^2/m^2 ----------    Right atrium                             Value          Reference  RA ID, S-I, ES, A4C              (H)     61.8  mm       34 - 49  RA area, ES, A4C                         18.8  cm^2     8.3 - 19.5  RA volume, ES, A/L                       48.1  ml       ----------  RA volume/bsa, ES, A/L                   19.9  ml/m^2   ----------    Right ventricle                          Value          Reference  RV ID, minor axis, ED, A4C base          26.2  mm       ----------  RV s&', lateral, S                        6.41  cm/s     ----------  Legend: (L)  and  (H)  mark values outside specified reference range.  ------------------------------------------------------------------- Prepared and Electronically Authenticated by  Ena Dawley, M.D. 2019-12-05T16:20:16      Patient Profile  72 y.o. female admitted for appendicitis  Called for eval of SVT and elevated troponin    Assessment & Plan   1  SVT  No recurrence    2   Elevated troponin  Peak troponin 1.11  Coming down  Most likely demand in setting of SVT, surgery     LVEF normal   Would not  plan ischemic eval for now.    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     For questions or updates, please contact [facility] Please consult [email or web address] for contact info under        Signed, Dorris Carnes, MD  01/01/2018, 2:36 PM

## 2018-01-01 NOTE — Care Management Note (Signed)
Case Management Note  Patient Details  Name: TEMEKA PORE MRN: [id number] Date of Birth: 01/15/1946  Subjective/Objective:                  Sepsis Acute appendicitis perforation/localized abscess Laparoscopic appendectomy, drain placement, 12/30/2017 Dr. Stark Klein. Postop SVT placed on Cardizem/postiive Troponin Hx of Mild AR/AS, grade II diastolic dysfunction, EF 62% 12/2014  FEN: IV fluids/Cardizem drip magnesium replacement ID: Maxipime/Flagyl x112/4 >> Zosyn 12/5 =>>day 1 DVT: Lovenox  Action/Plan: Lives alone Will follow for progression of care and clinical status. Will follow for case management needs none present at this time.  Expected Discharge Date:  (unknown)               Expected Discharge Plan:     In-House Referral:     Discharge planning Services     Post Acute Care Choice:    Choice offered to:     DME Arranged:    DME Agency:     HH Arranged:    HH Agency:     Status of Service:     If discussed at [employer], dates discussed:    Additional Comments:  Leeroy Cha, RN 01/01/2018, 9:09 AM

## 2018-01-01 NOTE — Progress Notes (Signed)
  Pt. assisted with placement of home CPAP nasal mask, placed on 3 lpm Oxygen bled into to circuit, family member at bedside, tolerating well.

## 2018-01-02 DIAGNOSIS — I350 Nonrheumatic aortic (valve) stenosis: Secondary | ICD-10-CM

## 2018-01-02 DIAGNOSIS — I10 Essential (primary) hypertension: Secondary | ICD-10-CM

## 2018-01-02 DIAGNOSIS — I517 Cardiomegaly: Secondary | ICD-10-CM

## 2018-01-02 LAB — CBC WITH DIFFERENTIAL/PLATELET
Abs Immature Granulocytes: 0.09 10*3/uL — ABNORMAL HIGH (ref 0.00–0.07)
Basophils Absolute: 0 10*3/uL (ref 0.0–0.1)
Basophils Relative: 0 %
Eosinophils Absolute: 0 10*3/uL (ref 0.0–0.5)
Eosinophils Relative: 0 %
HCT: 39.8 % (ref 36.0–46.0)
Hemoglobin: 12.2 g/dL (ref 12.0–15.0)
IMMATURE GRANULOCYTES: 1 %
Lymphocytes Relative: 5 %
Lymphs Abs: 0.6 10*3/uL — ABNORMAL LOW (ref 0.7–4.0)
MCH: 29.8 pg (ref 26.0–34.0)
MCHC: 30.7 g/dL (ref 30.0–36.0)
MCV: 97.1 fL (ref 80.0–100.0)
MONO ABS: 0.5 10*3/uL (ref 0.1–1.0)
Monocytes Relative: 4 %
Neutro Abs: 11.2 10*3/uL — ABNORMAL HIGH (ref 1.7–7.7)
Neutrophils Relative %: 90 %
Platelets: 141 10*3/uL — ABNORMAL LOW (ref 150–400)
RBC: 4.1 MIL/uL (ref 3.87–5.11)
RDW: 14 % (ref 11.5–15.5)
WBC: 12.5 10*3/uL — ABNORMAL HIGH (ref 4.0–10.5)
nRBC: 0 % (ref 0.0–0.2)

## 2018-01-02 LAB — OSMOLALITY, URINE: Osmolality, Ur: 830 mOsm/kg (ref 300–900)

## 2018-01-02 LAB — TSH: TSH: 4.906 u[IU]/mL — ABNORMAL HIGH (ref 0.350–4.500)

## 2018-01-02 LAB — MAGNESIUM: Magnesium: 2.3 mg/dL (ref 1.7–2.4)

## 2018-01-02 LAB — COMPREHENSIVE METABOLIC PANEL
ALT: 13 U/L (ref 0–44)
AST: 12 U/L — ABNORMAL LOW (ref 15–41)
Albumin: 2.8 g/dL — ABNORMAL LOW (ref 3.5–5.0)
Alkaline Phosphatase: 47 U/L (ref 38–126)
Anion gap: 7 (ref 5–15)
BUN: 40 mg/dL — ABNORMAL HIGH (ref 8–23)
CO2: 22 mmol/L (ref 22–32)
Calcium: 8 mg/dL — ABNORMAL LOW (ref 8.9–10.3)
Chloride: 110 mmol/L (ref 98–111)
Creatinine, Ser: 1.28 mg/dL — ABNORMAL HIGH (ref 0.44–1.00)
GFR calc Af Amer: 48 mL/min — ABNORMAL LOW (ref >60)
GFR calc non Af Amer: 41 mL/min — ABNORMAL LOW (ref >60)
Glucose, Bld: 183 mg/dL — ABNORMAL HIGH (ref 70–99)
POTASSIUM: 4.4 mmol/L (ref 3.5–5.1)
Sodium: 139 mmol/L (ref 135–145)
Total Bilirubin: 0.7 mg/dL (ref 0.3–1.2)
Total Protein: 6.3 g/dL — ABNORMAL LOW (ref 6.5–8.1)

## 2018-01-02 LAB — GLUCOSE, CAPILLARY
Glucose-Capillary: 178 mg/dL — ABNORMAL HIGH (ref 70–99)
Glucose-Capillary: 164 mg/dL — ABNORMAL HIGH (ref 70–99)
Glucose-Capillary: 140 mg/dL — ABNORMAL HIGH (ref 70–99)
Glucose-Capillary: 148 mg/dL — ABNORMAL HIGH (ref 70–99)

## 2018-01-02 LAB — URINE CULTURE: Culture: NO GROWTH

## 2018-01-02 LAB — PHOSPHORUS: Phosphorus: 2.4 mg/dL — ABNORMAL LOW (ref 2.5–4.6)

## 2018-01-02 MED ORDER — FUROSEMIDE 10 MG/ML IJ SOLN: 40.0000 mg | Freq: Once | INTRAMUSCULAR | Status: DC

## 2018-01-02 MED ORDER — FUROSEMIDE 10 MG/ML IJ SOLN
60.0000 mg | Freq: Once | INTRAMUSCULAR | Status: AC
  Administered 2018-01-02: 60 mg via INTRAVENOUS
  Filled 2018-01-02: qty 6

## 2018-01-02 MED ORDER — MENTHOL 3 MG MT LOZG
1.0000 | LOZENGE | OROMUCOSAL | Status: DC | PRN
  Filled 2018-01-02: qty 9

## 2018-01-02 MED ORDER — GUAIFENESIN-DM 100-10 MG/5ML PO SYRP: 10.0000 mL | ORAL_SOLUTION | ORAL | Status: DC | PRN

## 2018-01-02 MED ORDER — HYDROCORTISONE 1 % EX CREA: 1.0000 "application " | TOPICAL_CREAM | Freq: Three times a day (TID) | CUTANEOUS | Status: DC | PRN

## 2018-01-02 MED ORDER — ALUM & MAG HYDROXIDE-SIMETH 200-200-20 MG/5ML PO SUSP: 30.0000 mL | Freq: Four times a day (QID) | ORAL | Status: DC | PRN

## 2018-01-02 MED ORDER — MAGIC MOUTHWASH
15.0000 mL | Freq: Four times a day (QID) | ORAL | Status: DC | PRN
  Filled 2018-01-02: qty 15

## 2018-01-02 MED ORDER — K PHOS MONO-SOD PHOS DI & MONO 155-852-130 MG PO TABS
500.0000 mg | ORAL_TABLET | Freq: Two times a day (BID) | ORAL | Status: AC
  Administered 2018-01-02 (×2): 500 mg via ORAL
  Filled 2018-01-02 (×2): qty 2

## 2018-01-02 MED ORDER — HYDROCORTISONE 2.5 % RE CREA: 1.0000 "application " | TOPICAL_CREAM | Freq: Four times a day (QID) | RECTAL | Status: DC | PRN

## 2018-01-02 MED ORDER — LIP MEDEX EX OINT
1.0000 "application " | TOPICAL_OINTMENT | Freq: Two times a day (BID) | CUTANEOUS | Status: DC
  Administered 2018-01-02 (×2): 1 via TOPICAL
  Filled 2018-01-02: qty 7

## 2018-01-02 MED ORDER — PHENOL 1.4 % MT LIQD: 1.0000 | OROMUCOSAL | Status: DC | PRN

## 2018-01-02 MED ORDER — BISACODYL 10 MG RE SUPP
10.0000 mg | Freq: Every day | RECTAL | Status: DC
  Administered 2018-01-02 – 2018-01-04 (×3): 10 mg via RECTAL
  Filled 2018-01-02 (×4): qty 1

## 2018-01-02 MED ORDER — ACETAMINOPHEN 500 MG PO TABS
1000.0000 mg | ORAL_TABLET | Freq: Three times a day (TID) | ORAL | Status: DC
  Administered 2018-01-02 – 2018-01-06 (×10): 1000 mg via ORAL
  Filled 2018-01-02 (×13): qty 2

## 2018-01-02 MED ORDER — METHOCARBAMOL 1000 MG/10ML IJ SOLN
500.0000 mg | Freq: Four times a day (QID) | INTRAVENOUS | Status: DC | PRN
  Filled 2018-01-02: qty 5

## 2018-01-02 NOTE — Progress Notes (Signed)
PROGRESS NOTE    Rebecca Rich  HUD:[id number] DOB: [birth year] DOA: 12/30/2017 PCP: Burnis Medin, MD   Brief Narrative:  The patient is 72 year old female with history of morbid obesity, sleep apnea, hypertension, diabetes mellitus type 2 presents to the ER because of 2 days of abdominal pain mostly in the lower quadrants with nausea.  Pain has been worsening with increased on minimal movement.  Had gone to urgent care and was referred to the ER.  In the ER CT of the abdomen and pelvis done shows possible perforated appendicitis and was taken to the OR.  Before taken to the OR patient had a brief run of SVT and was placed on Cardizem infusion.  Following the surgery patient's heart rate is in the normal range sinus.  Medicine was consulted for assistance and medical management.   Assessment & Plan:   Principal Problem:   Acute appendicitis with perforation and localized peritonitis Active Problems:   Diabetes mellitus type 2 in obese (HCC)   Morbid obesity (HCC)   OSA on CPAP   Essential hypertension   Idiopathic cardiomegaly   Hypothyroid   Mild aortic stenosis   SVT (supraventricular tachycardia) (HCC)  Diabetes mellitus type 2  -We will keep patient on sliding scale coverage with resistant scale AC -Closely follow CBGs. -CBG's ranging from 155-190  Hypertension  -We will keep patient on PRN IV hydralazine and held off antihypertensives due to low normal blood pressure initially. -Given dose of IV he is more -Likely can be restarted on her home antihypertensive medications now -Avoid Hypotension  Sleep Apnea  -C/w  CPAP  Sepsis from perforated appendix and possible developing ileus -Sepsis physiology is improved -Patient is status post laparoscopic appendectomy and drain placement on 12/30/2017 by Dr. Stark Klein -Appreciate general surgery input patient is on Zosyn and clear liquids and general surgery going to a full liquid diet/dysphagia 1 diet but patient was having  significant nausea this morning -Continue with nausea control -Per general surgery of persistently elevated white count or evidence of ileus or other concerns consider repeating a CT scan of few days -Continue to monitor closely.  Follow lactate.  Hypothyroidism  -C/w Synthroid 75 mcg p.o. daily  Brief run of SVT  -Which improved with IV Cardizem.   -Presently in sinus rhythm.  Will check TSH and troponin  Elevated Troponin -Suspect Demand ischemia in the Setting of SVT and Perforated Appendicitis as patient has denied any CP -Troponin Trending Down -Cardiology consulted and recommending ECHO -ECHOCardiogram done and showed Normal EF of 60-65% and G1DD with Normal Wall motion and no wall motion abnormalities  Super Morbid Obesity -Estimated body mass index is 56.69 kg/m as calculated from the following:   Height as of this encounter: 5\' 1"  (1.549 m).   Weight as of this encounter: 136.1 kg. -Weight Loss Counseling Given  Poor Urine Output, improved  -Foley placed for Measurement and Strict I's/O's -Bolused 3 Liters of NS  -IV Maintenance Fluide of NS + 30 mEQ of KCl at 100 mL/hr reduced to 50 mL/hr  -Given a dose of IV Lasix 60 mg this morning and had good urine output -Renal U/S Normal -Cr slightly bumped since but improved back to 1.52 is now even better at 1.28 -? Mediated by SVT and Surgery -If Cr worsening will obtain a Renal Consultation  -Continue to Monitor Carefully and exteremly closely -Check UA and Urine Cx ; urinalysis not really remarkable but did show specific gravity 1.32 -Urine culture still  pending -Urine Osm was 830, urine potassium 64, urine sodium was less than 10, urine creatinine is 257.4 -Has improved output since her Lasix and will continue to monitor her volume status very carefully -General surgery advancing her diet slowly  DVT prophylaxis: Enoxaparin per Primary Code Status: FULL CODE Family Communication: Discussed with Friend at  bedside Disposition Plan: Per Primary Team  Consultants:   [facility]  Cardiology   Procedures:  Renal U/S  ECHOCARDIOGRAM ------------------------------------------------------------------- Study Conclusions  - Left ventricle: The cavity size was normal. There was moderate   concentric hypertrophy. Systolic function was normal. The   estimated ejection fraction was in the range of 60% to 65%. Wall   motion was normal; there were no regional wall motion   abnormalities. Doppler parameters are consistent with abnormal   left ventricular relaxation (grade 1 diastolic dysfunction).   Doppler parameters are consistent with elevated ventricular   end-diastolic filling pressure. - Aortic valve: Trileaflet; moderately thickened, moderately   calcified leaflets. 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 There was moderate   regurgitation. Mean gradient (S): 10 mm Hg. Peak gradient (S): 19   mm Hg. Valve area (VTI): 2.44 cm^2. Valve area (Vmax): 2.54 cm^2.   Valve area (Vmean): 2.63 cm^2. - Mitral valve: Calcified annulus. Mildly thickened leaflets .   There was no regurgitation. - Right ventricle: The cavity size was mildly dilated. Wall   thickness was normal. Systolic function was mildly reduced. - Tricuspid valve: There was mild regurgitation. - Pulmonary arteries: Systolic pressure was within the normal   range. - Pericardium, extracardiac: There was no pericardial effusion.  Antimicrobials:  Anti-infectives (From admission, onward)   Start     Dose/Rate Route Frequency Ordered Stop   12/31/17 0100  piperacillin-tazobactam (ZOSYN) IVPB 3.375 g     3.375 g 12.5 mL/hr over 240 Minutes Intravenous Every 8 hours 12/31/17 0055     12/30/17 1045  ceFEPIme (MAXIPIME) 2 g in sodium chloride 0.9 % 100 mL IVPB     2 g 200 mL/hr over 30 Minutes Intravenous  Once 12/30/17 1044 12/30/17 1342   12/30/17 1045  metroNIDAZOLE (FLAGYL) IVPB 500 mg  Status:  Discontinued     500 mg 100 mL/hr over 60 Minutes  Intravenous Every 8 hours 12/30/17 1044 12/31/17 0055     Subjective: Seen and Examined this morning states she is feeling significantly nauseous.  No chest pain, lightheadedness or dizziness but her main complaint was nausea.  Given a dose of IV Lasix morning and had significant amount of urine output.  We will continue monitor strict I's and O's and will cut down her fluid rate to 50 mils per hour.  Patient feels okay.  No other concerns or complaints at this time  Objective: Vitals:   01/02/18 0810 01/02/18 0900 01/02/18 1000 01/02/18 1100  BP: (!) 162/36 (!) 165/38 (!) 166/45 (!) 171/52  Pulse: 89 88 87 94  Resp: (!) 23 (!) 25 (!) 31   Temp:      TempSrc:      SpO2: 95% 95% 95% 95%  Weight:      Height:        Intake/Output Summary (Last 24 hours) at 01/02/2018 1127 Last data filed at 01/02/2018 1000 Gross per 24 hour  Intake 3084.26 ml  Output 1790 ml  Net 1294.26 ml   Filed Weights   12/30/17 1018 01/01/18 0700 01/02/18 0500  Weight: 126.6 kg 135.1 kg (!) 136.1 kg   Examination: Physical Exam:  Constitutional: Well nourished, well  developed morbidly obese female currently in mild distress appears uncomfortable and is nauseous Eyes: Lids and conjunctive are normal.  Sclera nonicteric ENMT: External ears and nose appear normal.  Grossly normal hearing Neck: Appears supple no JVD Respiratory: Diminished to auscultation bilaterally no appreciable wheezing, rales, rhonchi.  Patient not tachypneic wheezing excess muscle breathe Cardiovascular: Regular rate and rhythm.  No appreciable murmurs, rubs or gallops Abdomen: Soft, mildly tender, distended secondary body habitus.  Bowel sounds present and she has a JP drain in the left side of the abdomen GU: Deferred Musculoskeletal: No contractures or cyanosis.  No joint deformities noted Skin: No appreciable rashes or lesions on a limited skin evaluation but does have a skin tag on her neck Neurologic: Cranial nerves II through XII  grossly intact with no appreciable focal deficits Psychiatric: Slightly withdrawn.  Normal judgment and insight.  Data Reviewed: I have personally reviewed following labs and imaging studies  CBC: Recent Labs  Lab 12/30/17 1101 12/31/17 0314 01/01/18 0328 01/02/18 0320  WBC 15.1* 12.6* 11.7* 12.5*  NEUTROABS 14.2*  --   --  11.2*  HGB 15.1* 12.5 13.2 12.2  HCT 46.9* 39.5 41.9 39.8  MCV 93.6 95.4 95.2 97.1  PLT 147* 120* 126* 017*   Basic Metabolic Panel: Recent Labs  Lab 12/30/17 1059 12/31/17 0314 01/01/18 0328 01/01/18 1630 01/02/18 0320  NA 140 140 139 140 139  K 3.4* 3.4* 3.9 4.2 4.4  CL 103 108 110 108 110  CO2 22 22 18* 21* 22  GLUCOSE 230* 258* 190* 160* 183*  BUN 31* 34* 43* 43* 40*  CREATININE 1.58* 1.49* 1.63* 1.52* 1.28*  CALCIUM 9.1 7.8* 8.2* 8.1* 8.0*  MG  --  1.5* 2.3  --  2.3  PHOS  --   --   --   --  2.4*   GFR: Estimated Creatinine Clearance: 50.6 mL/min (A) (by C-G formula based on SCr of 1.28 mg/dL (H)). Liver Function Tests: Recent Labs  Lab 12/30/17 1059 01/02/18 0320  AST 31 12*  ALT 17 13  ALKPHOS 70 47  BILITOT 1.7* 0.7  PROT 7.9 6.3*  ALBUMIN 4.0 2.8*   Recent Labs  Lab 12/30/17 1059  LIPASE 37   No results for input(s): AMMONIA in the last 168 hours. Coagulation Profile: No results for input(s): INR, PROTIME in the last 168 hours. Cardiac Enzymes: Recent Labs  Lab 12/31/17 0726 12/31/17 1001 12/31/17 1556 12/31/17 2106  TROPONINI 1.11* 1.09* 0.75* 0.75*   BNP (last 3 results) No results for input(s): PROBNP in the last 8760 hours. HbA1C: Recent Labs    12/31/17 0314  HGBA1C 7.0*   CBG: Recent Labs  Lab 01/01/18 0734 01/01/18 1204 01/01/18 1606 01/01/18 2145 01/02/18 0732  GLUCAP 177* 190* 155* 147* 178*   Lipid Profile: No results for input(s): CHOL, HDL, LDLCALC, TRIG, CHOLHDL, LDLDIRECT in the last 72 hours. Thyroid Function Tests: Recent Labs    12/31/17 0726  TSH 1.696   Anemia Panel: No  results for input(s): VITAMINB12, FOLATE, FERRITIN, TIBC, IRON, RETICCTPCT in the last 72 hours. Sepsis Labs: Recent Labs  Lab 12/30/17 1106 12/30/17 1305 12/31/17 0726 12/31/17 1001  LATICACIDVEN 3.97* 2.95* 2.8* 2.3*    Recent Results (from the past 240 hour(s))  Urine culture     Status: None   Collection Time: 12/30/17  1:46 AM  Result Value Ref Range Status   Specimen Description   Final    URINE, RANDOM Performed at [facility]  [address], [place], [place] [address]    Special Requests   Final    NONE Performed at [facility], [place] [address], [place], [place] [address]    Culture   Final    NO GROWTH Performed at [facility], [place] [address] [place], [place] [address]    Report Status 01/01/2018 FINAL  Final  Blood Culture (routine x 2)     Status: None (Preliminary result)   Collection Time: 12/30/17 11:01 AM  Result Value Ref Range Status   Specimen Description   Final    RIGHT ANTECUBITAL Performed at [facility] [address] [place], [place] [address]    Special Requests   Final    Blood Culture adequate volume BOTTLES DRAWN AEROBIC AND ANAEROBIC   Culture   Final    NO GROWTH 2 DAYS Performed at [facility], [place] [address], [place],  [address]    Report Status PENDING  Incomplete  Blood Culture (routine x 2)     Status: None (Preliminary result)   Collection Time: 12/30/17 11:01 AM  Result Value Ref Range Status   Specimen Description   Final    BLOOD LEFT HAND Performed at [facility] [address], [place],  [address]    Special Requests   Final    Blood Culture results may not be optimal due to an inadequate volume of blood received in culture bottles BOTTLES DRAWN AEROBIC AND ANAEROBIC   Culture   Final    NO GROWTH 2 DAYS Performed at [facility], [place] [address] [place],  [address]    Report Status PENDING   Incomplete  MRSA PCR Screening     Status: None   Collection Time: 12/31/17 12:53 AM  Result Value Ref Range Status   MRSA by PCR NEGATIVE NEGATIVE Final    Comment:        The GeneXpert MRSA Assay (FDA approved for NASAL specimens only), is one component of a comprehensive MRSA colonization surveillance program. It is not intended to diagnose MRSA infection nor to guide or monitor treatment for MRSA infections. Performed at [facility], [place] [address] [place],  [address]     Radiology Studies: US Renal  Result Date: 01/01/2018 CLINICAL DATA:  Acute kidney injury EXAM: RENAL / URINARY TRACT ULTRASOUND COMPLETE COMPARISON:  None. FINDINGS: Right Kidney: Renal measurements: 11.5 x 5.3 x 6.4 cm = volume: 202 mL . Echogenicity within normal limits. No mass or hydronephrosis visualized. Left Kidney: Renal measurements: 12.3 x 6 x 4.5 cm = volume: 174 mL. Echogenicity within normal limits. No mass or hydronephrosis visualized. Bladder: Bladder not seen, presumably decompressed. IMPRESSION: Normal renal ultrasound. Electronically Signed   By: Franki Cabot M.D.   On: 01/01/2018 09:58   Scheduled Meds: . acetaminophen  1,000 mg Oral TID  . bisacodyl  10 mg Rectal Daily  . enoxaparin (LOVENOX) injection  40 mg Subcutaneous Q24H  . gabapentin  300 mg Oral BID  . insulin aspart  0-20 Units Subcutaneous TID WC  . levothyroxine  75 mcg Oral Q0600  . lip balm  1 application Topical BID  . mouth rinse  15 mL Mouth Rinse BID  . phosphorus  500 mg Oral BID   Continuous Infusions: . methocarbamol (ROBAXIN) IV    . piperacillin-tazobactam (ZOSYN)  IV Stopped (01/02/18 1007)  . 0.9 % sodium chloride with kcl 50 mL/hr at 01/02/18 [phone]  LOS: 3 days   Kerney Elbe, DO [facility] PAGER is on [place]  If 7PM-7AM, please contact night-coverage [email or web address] Password [email or web address] 01/02/2018, 11:27 AM

## 2018-01-02 NOTE — Progress Notes (Addendum)
RN came in patient's room to find blood on the sheets, IV pulled, and JP bulb pulled on LLQ. RN asked patient if she was pulling on her tubes and she stated, "Yes, I was trying to get untangled." JP bulb replaced. Will continue to monitor patient.

## 2018-01-02 NOTE — Progress Notes (Signed)
Rebecca Rich [id number] [birth year]  CARE TEAM:  PCP: Burnis Medin, MD  Outpatient Care Team: Patient Care Team: Panosh, Standley Brooking, MD as PCP - General Alphonsa Overall, MD (General Surgery) Carol Ada, MD (Gastroenterology) Gean Birchwood, DPM (Podiatry) Adrian Prows, MD as Consulting Physician (Cardiology) Juanita Craver, MD as Consulting Physician (Gastroenterology) Zadie Rhine Clent Demark, MD as Consulting Physician (Ophthalmology)  Inpatient Treatment Team: Treatment Team: Attending Provider: Edison Pace, Md, MD; Rounding Team: Kim, Md, MD; Rounding Team: Fatima Blank, MD; Registered Nurse: Cyril Loosen, RN   Problem List:   Principal Problem:   Acute appendicitis with perforation and localized peritonitis Active Problems:   Diabetes mellitus type 2 in obese [facility])   Morbid obesity ([place])   OSA on CPAP   Essential hypertension   Idiopathic cardiomegaly   Hypothyroid   Mild aortic stenosis   SVT (supraventricular tachycardia) ([place])   3 Days Post-Op  12/30/2017  Procedure(s): APPENDECTOMY LAPAROSCOPIC    Assessment  Sepsis from Acute appendicitis perforation/localized abscess Laparoscopic appendectomy, drain placement, 12/30/2017 Dr. Stark Klein.  Slowly improving but guarded  [facility] Stay = 3 days)  Plan:  Offer full liquid/dysphagia 1 diet.  Nausea control would hold there until moves bowels are seems consistently better.  Low threshold to go back down to sips of feels worse.  ID: Maxipime/Flagyl - Started 12/4>>Zosyn 12/5=>>day 2/5.  Mild increase in white count -most likely within standard deviation and not significant.  Follow.  If persistently increasing or evidence of ileus or other concerns, consider repeat CT scan around postoperative day #5 to rule out abscess, etc.  Postop SVT placed on Cardizem/postiive Troponin 1.11 >>1.09 >> 0.75 >> 0.75 Hx of Mild AR/AS, grade II diastolic dysfunction, EF 63% 12/2014 Tachycardia resolved.  Most likely some mild  demand ischemia.  Cardiology underwhelmed and signed off.  Acute renal failure resolving.  Renal ultrasound shows no obstruction, reassuring.  FEN: IV fluids.  Liquid diet.  Okay to advance to full liquid diet as/dysphagia 1 diet as tolerated.  Suspected will go slowly.  Nausea control.  Consider transferring to floor if remains hemodynamically stable with no recurrent tachycardia today  DVT: Lovenox  Follow-up: TBD      25 minutes spent in review, evaluation, examination, counseling, and coordination of care.  More than 50% of that time was spent in counseling.  01/02/2018    Subjective: (Chief complaint)  Confused and pulled out lines and some tubes while trying to get untangled.  Sore but stable in bed.  Friend at bedside.  Stepdown nurse at bedside.  Had some nausea this morning but was tolerating some liquids with flatus.  Objective:  Vital signs:  Vitals:   01/02/18 0333 01/02/18 0400 01/02/18 0500 01/02/18 0600  BP:  (!) 176/53 (!) 176/49 (!) 164/48  Pulse:  82 83 89  Resp:  19  (!) 21  Temp: 98.3 F (36.8 C)     TempSrc: Oral     SpO2:  98% 93% 95%  Weight:   (!) 136.1 kg   Height:        Last BM Date: 12/28/17  Intake/Output   Yesterday:  12/06 0701 - 12/07 0700 In: 3184.5 [P.O.:600; I.V.:1472.9; IV Piggyback:1111.7] Out: 665 [Urine:610; Drains:55] This shift:  No intake/output data recorded.  Bowel function:  Flatus: YES  BM:  No  Drain: Serosanguinous   Physical Exam:  General: Pt awake/alert/oriented x4 in mild acute distress Eyes: PERRL, normal EOM.  Sclera clear.  No icterus Neuro: CN  II-XII intact w/o focal sensory/motor deficits. Lymph: No head/neck/groin lymphadenopathy Psych:  No delerium/psychosis/paranoia HENT: Normocephalic, Mucus membranes moist.  No thrush Neck: Supple, No tracheal deviation Chest: No chest wall pain w good excursion CV:  Pulses intact.  Regular rhythm MS: Normal AROM mjr joints.  No obvious  deformity  Abdomen: Morbidly obese incisions closed from prior laparoscopic appendectomy Somewhat firm.  Moderately distended.  Mildly tender at incisions only.  No evidence of peritonitis.  No incarcerated hernias.  Ext:   No deformity.  1-2+ mjr edema.  No cyanosis Skin: No petechiae / purpura  Results:   Labs: Results for orders placed or performed during the hospital encounter of 12/30/17 (from the past 48 hour(s))  Glucose, capillary     Status: Abnormal   Collection Time: 12/31/17  8:23 AM  Result Value Ref Range   Glucose-Capillary 260 (H) 70 - 99 mg/dL   Comment 1 Notify RN    Comment 2 Document in Chart   Lactic acid, plasma     Status: Abnormal   Collection Time: 12/31/17 10:01 AM  Result Value Ref Range   Lactic Acid, Venous 2.3 (HH) 0.5 - 1.9 mmol/L    Comment: CRITICAL RESULT CALLED TO, READ BACK BY AND VERIFIED WITH: ROMINES,H @ 1038 ON [id number] BY POTEAT,S Performed at [facility] [address], [place], [place] [address]   Troponin I - Now Then Q6H     Status: Abnormal   Collection Time: 12/31/17 10:01 AM  Result Value Ref Range   Troponin I 1.09 (HH) <0.03 ng/mL    Comment: CRITICAL VALUE NOTED.  VALUE IS CONSISTENT WITH PREVIOUSLY REPORTED AND CALLED VALUE. Performed at [facility], [place] [address] [place], [place] [address]   Blood gas, arterial     Status: Abnormal   Collection Time: 12/31/17 10:31 AM  Result Value Ref Range   O2 Content 2.0 L/min   Delivery systems NASAL CANNULA    pH, Arterial 7.412 7.350 - 7.450   pCO2 arterial 38.1 32.0 - 48.0 mmHg   pO2, Arterial 65.8 (L) 83.0 - 108.0 mmHg   Bicarbonate 23.6 20.0 - 28.0 mmol/L   Acid-base deficit 0.1 0.0 - 2.0 mmol/L   O2 Saturation 92.3 %   Patient temperature 99.8    Collection site RIGHT RADIAL    Drawn by [id number]    Sample type ARTERIAL DRAW    Allens test (pass/fail) PASS PASS    Comment: Performed at [facility], [place] [address], [place], [place] [address]  Glucose, capillary     Status: Abnormal   Collection Time: 12/31/17 11:39 AM  Result Value Ref Range   Glucose-Capillary 227 (H) 70 - 99 mg/dL  Troponin I - Now Then Q6H     Status: Abnormal   Collection Time: 12/31/17  3:56 PM  Result Value Ref Range   Troponin I 0.75 (HH) <0.03 ng/mL    Comment: CRITICAL VALUE NOTED.  VALUE IS CONSISTENT WITH PREVIOUSLY REPORTED AND CALLED VALUE. Performed at [facility], [place] [address] [place], [place] [address]   Glucose, capillary     Status: Abnormal   Collection Time: 12/31/17  5:02 PM  Result Value Ref Range   Glucose-Capillary 168 (H) 70 - 99 mg/dL   Comment 1 Notify RN    Comment 2 Document in Chart   Glucose, capillary     Status: Abnormal   Collection Time: 12/31/17  9:02 PM  Result Value Ref Range  Glucose-Capillary 172 (H) 70 - 99 mg/dL   Comment 1 Notify RN    Comment 2 Document in Chart   Troponin I - Now Then Q6H     Status: Abnormal   Collection Time: 12/31/17  9:06 PM  Result Value Ref Range   Troponin I 0.75 (HH) <0.03 ng/mL    Comment: CRITICAL VALUE NOTED.  VALUE IS CONSISTENT WITH PREVIOUSLY REPORTED AND CALLED VALUE. Performed at [facility], [place] [address], [place], [place] [address]   CBC     Status: Abnormal   Collection Time: 01/01/18  3:28 AM  Result Value Ref Range   WBC 11.7 (H) 4.0 - 10.5 K/uL   RBC 4.40 3.87 - 5.11 MIL/uL   Hemoglobin 13.2 12.0 - 15.0 g/dL   HCT 41.9 36.0 - 46.0 %   MCV 95.2 80.0 - 100.0 fL   MCH 30.0 26.0 - 34.0 pg   MCHC 31.5 30.0 - 36.0 g/dL   RDW 13.9 11.5 - 15.5 %   Platelets 126 (L) 150 - 400 K/uL   nRBC 0.0 0.0 - 0.2 %    Comment: Performed at [facility], [place] [address] [place], [place] [address]  Basic metabolic panel     Status: Abnormal   Collection Time: 01/01/18  3:28 AM  Result Value Ref Range   Sodium 139 135 - 145 mmol/L   Potassium 3.9 3.5 - 5.1 mmol/L   Chloride 110 98 - 111  mmol/L   CO2 18 (L) 22 - 32 mmol/L   Glucose, Bld 190 (H) 70 - 99 mg/dL   BUN 43 (H) 8 - 23 mg/dL   Creatinine, Ser 1.63 (H) 0.44 - 1.00 mg/dL   Calcium 8.2 (L) 8.9 - 10.3 mg/dL   GFR calc non Af Amer 31 (L) >60 mL/min   GFR calc Af Amer 36 (L) >60 mL/min   Anion gap 11 5 - 15    Comment: Performed at [facility], [place] [address], [place], [place] [address]  Magnesium     Status: None   Collection Time: 01/01/18  3:28 AM  Result Value Ref Range   Magnesium 2.3 1.7 - 2.4 mg/dL    Comment: Performed at [facility], [place] [address], [place], [place] [address]  Glucose, capillary     Status: Abnormal   Collection Time: 01/01/18  7:34 AM  Result Value Ref Range   Glucose-Capillary 177 (H) 70 - 99 mg/dL   Comment 1 Notify RN    Comment 2 Document in Chart   Glucose, capillary     Status: Abnormal   Collection Time: 01/01/18 12:04 PM  Result Value Ref Range   Glucose-Capillary 190 (H) 70 - 99 mg/dL   Comment 1 Notify RN    Comment 2 Document in Chart   Glucose, capillary     Status: Abnormal   Collection Time: 01/01/18  4:06 PM  Result Value Ref Range   Glucose-Capillary 155 (H) 70 - 99 mg/dL   Comment 1 Notify RN    Comment 2 Document in Chart   Urinalysis, Routine w reflex microscopic     Status: Abnormal   Collection Time: 01/01/18  4:15 PM  Result Value Ref Range   Color, Urine AMBER (A) YELLOW    Comment: BIOCHEMICALS MAY BE AFFECTED BY COLOR   APPearance CLEAR CLEAR   Specific Gravity, Urine 1.032 (H) 1.005 - 1.030   pH 5.0 5.0 - 8.0   Glucose, UA NEGATIVE  NEGATIVE mg/dL   Hgb urine dipstick NEGATIVE NEGATIVE   Bilirubin Urine NEGATIVE NEGATIVE   Ketones, ur NEGATIVE NEGATIVE mg/dL   Protein, ur 30 (A) NEGATIVE mg/dL   Nitrite NEGATIVE NEGATIVE   Leukocytes, UA NEGATIVE NEGATIVE   RBC / HPF 0-5 0 - 5 RBC/hpf   WBC, UA 0-5 0 - 5 WBC/hpf   Bacteria, UA RARE (A) NONE SEEN   Squamous Epithelial / LPF 0-5 0 - 5   Amorphous Crystal  PRESENT     Comment: Performed at [facility], [place] [address] [place], [place] [address]  Na and K (sodium & potassium), rand urine     Status: None   Collection Time: 01/01/18  4:15 PM  Result Value Ref Range   Sodium, Ur <10 mmol/L    Comment: REPEATED TO VERIFY   Potassium Urine 64 mmol/L    Comment: Performed at [facility], [place] [address], [place], [place] [address]  Creatinine, urine, random     Status: None   Collection Time: 01/01/18  4:15 PM  Result Value Ref Range   Creatinine, Urine 257.54 mg/dL    Comment: Performed at [facility], [place] [address] [place], [place] [address]  Osmolality, urine     Status: None   Collection Time: 01/01/18  4:15 PM  Result Value Ref Range   Osmolality, Ur 830 300 - 900 mOsm/kg    Comment: Performed at [facility] [address] [place], [place] [address]  Basic metabolic panel     Status: Abnormal   Collection Time: 01/01/18  4:30 PM  Result Value Ref Range   Sodium 140 135 - 145 mmol/L   Potassium 4.2 3.5 - 5.1 mmol/L   Chloride 108 98 - 111 mmol/L   CO2 21 (L) 22 - 32 mmol/L   Glucose, Bld 160 (H) 70 - 99 mg/dL   BUN 43 (H) 8 - 23 mg/dL   Creatinine, Ser 1.52 (H) 0.44 - 1.00 mg/dL   Calcium 8.1 (L) 8.9 - 10.3 mg/dL   GFR calc non Af Amer 33 (L) >60 mL/min   GFR calc Af Amer 39 (L) >60 mL/min   Anion gap 11 5 - 15    Comment: Performed at [facility], [place] [address], [place], [place] [address]  Glucose, capillary     Status: Abnormal   Collection Time: 01/01/18  9:45 PM  Result Value Ref Range   Glucose-Capillary 147 (H) 70 - 99 mg/dL  Magnesium     Status: None   Collection Time: 01/02/18  3:20 AM  Result Value Ref Range   Magnesium 2.3 1.7 - 2.4 mg/dL    Comment: Performed at [facility], [place] [address] [place], [place] [address]  CBC with Differential/Platelet     Status: Abnormal   Collection Time: 01/02/18  3:20 AM   Result Value Ref Range   WBC 12.5 (H) 4.0 - 10.5 K/uL   RBC 4.10 3.87 - 5.11 MIL/uL   Hemoglobin 12.2 12.0 - 15.0 g/dL   HCT 39.8 36.0 - 46.0 %   MCV 97.1 80.0 - 100.0 fL   MCH 29.8 26.0 - 34.0 pg   MCHC 30.7 30.0 - 36.0 g/dL   RDW 14.0 11.5 - 15.5 %   Platelets 141 (L) 150 - 400 K/uL   nRBC 0.0 0.0 - 0.2 %   Neutrophils Relative % 90 %   Neutro Abs 11.2 (H) 1.7 - 7.7 K/uL  Lymphocytes Relative 5 %   Lymphs Abs 0.6 (L) 0.7 - 4.0 K/uL   Monocytes Relative 4 %   Monocytes Absolute 0.5 0.1 - 1.0 K/uL   Eosinophils Relative 0 %   Eosinophils Absolute 0.0 0.0 - 0.5 K/uL   Basophils Relative 0 %   Basophils Absolute 0.0 0.0 - 0.1 K/uL   Immature Granulocytes 1 %   Abs Immature Granulocytes 0.09 (H) 0.00 - 0.07 K/uL    Comment: Performed at [facility], [place] [address], [place], [place] [address]  Comprehensive metabolic panel     Status: Abnormal   Collection Time: 01/02/18  3:20 AM  Result Value Ref Range   Sodium 139 135 - 145 mmol/L   Potassium 4.4 3.5 - 5.1 mmol/L   Chloride 110 98 - 111 mmol/L   CO2 22 22 - 32 mmol/L   Glucose, Bld 183 (H) 70 - 99 mg/dL   BUN 40 (H) 8 - 23 mg/dL   Creatinine, Ser 1.28 (H) 0.44 - 1.00 mg/dL   Calcium 8.0 (L) 8.9 - 10.3 mg/dL   Total Protein 6.3 (L) 6.5 - 8.1 g/dL   Albumin 2.8 (L) 3.5 - 5.0 g/dL   AST 12 (L) 15 - 41 U/L   ALT 13 0 - 44 U/L   Alkaline Phosphatase 47 38 - 126 U/L   Total Bilirubin 0.7 0.3 - 1.2 mg/dL   GFR calc non Af Amer 41 (L) >60 mL/min   GFR calc Af Amer 48 (L) >60 mL/min   Anion gap 7 5 - 15    Comment: Performed at [facility], [place] [address], [place], [place] [address]  Phosphorus     Status: Abnormal   Collection Time: 01/02/18  3:20 AM  Result Value Ref Range   Phosphorus 2.4 (L) 2.5 - 4.6 mg/dL    Comment: Performed at [facility], [place] [address] [place], [place] [address]  Glucose, capillary     Status: Abnormal   Collection Time: 01/02/18  7:32  AM  Result Value Ref Range   Glucose-Capillary 178 (H) 70 - 99 mg/dL   Comment 1 Notify RN    Comment 2 Document in Chart     Imaging / Studies: US Renal  Result Date: 01/01/2018 CLINICAL DATA:  Acute kidney injury EXAM: RENAL / URINARY TRACT ULTRASOUND COMPLETE COMPARISON:  None. FINDINGS: Right Kidney: Renal measurements: 11.5 x 5.3 x 6.4 cm = volume: 202 mL . Echogenicity within normal limits. No mass or hydronephrosis visualized. Left Kidney: Renal measurements: 12.3 x 6 x 4.5 cm = volume: 174 mL. Echogenicity within normal limits. No mass or hydronephrosis visualized. Bladder: Bladder not seen, presumably decompressed. IMPRESSION: Normal renal ultrasound. Electronically Signed   By: Franki Cabot M.D.   On: 01/01/2018 09:58   Dg Chest Port 1v Same Day  Result Date: 12/31/2017 CLINICAL DATA:  Shortness of breath. EXAM: PORTABLE CHEST 1 VIEW COMPARISON:  Radiograph of December 30, 2017. FINDINGS: Stable cardiomegaly is noted with central pulmonary vascular congestion. Atherosclerosis of thoracic aorta is noted. No pneumothorax or pleural effusion is noted. No consolidative process is noted. Bony thorax is unremarkable. IMPRESSION: Stable cardiomegaly with central pulmonary vascular congestion. Aortic Atherosclerosis (ICD10-I70.0). Electronically Signed   By: Marijo Conception, M.D.   On: 12/31/2017 10:53    Medications / Allergies: per chart  Antibiotics: Anti-infectives (From admission, onward)   Start     Dose/Rate Route Frequency Ordered Stop   12/31/17 0100  piperacillin-tazobactam (  ZOSYN) IVPB 3.375 g     3.375 g 12.5 mL/hr over 240 Minutes Intravenous Every 8 hours 12/31/17 0055     12/30/17 1045  ceFEPIme (MAXIPIME) 2 g in sodium chloride 0.9 % 100 mL IVPB     2 g 200 mL/hr over 30 Minutes Intravenous  Once 12/30/17 1044 12/30/17 1342   12/30/17 1045  metroNIDAZOLE (FLAGYL) IVPB 500 mg  Status:  Discontinued     500 mg 100 mL/hr over 60 Minutes Intravenous Every 8 hours 12/30/17  1044 12/31/17 0055        Note: Portions of this report may have been transcribed using voice recognition software. Every effort was made to ensure accuracy; however, inadvertent computerized transcription errors may be present.   Any transcriptional errors that result from this process are unintentional.     Adin Hector, MD, FACS, MASCRS Gastrointestinal and Minimally Invasive Surgery    [address], [place], [place] [address] [phone] Main / Paging [phone] Fax

## 2018-01-03 ENCOUNTER — Inpatient Hospital Stay (HOSPITAL_COMMUNITY): Payer: Medicare Other

## 2018-01-03 DIAGNOSIS — R140 Abdominal distension (gaseous): Secondary | ICD-10-CM

## 2018-01-03 DIAGNOSIS — K560 Paralytic ileus: Secondary | ICD-10-CM

## 2018-01-03 LAB — CBC WITH DIFFERENTIAL/PLATELET
ABS IMMATURE GRANULOCYTES: 0.12 10*3/uL — AB (ref 0.00–0.07)
Basophils Absolute: 0 10*3/uL (ref 0.0–0.1)
Basophils Relative: 0 %
EOS PCT: 2 %
Eosinophils Absolute: 0.2 10*3/uL (ref 0.0–0.5)
HCT: 38.9 % (ref 36.0–46.0)
Hemoglobin: 12 g/dL (ref 12.0–15.0)
Immature Granulocytes: 1 %
Lymphocytes Relative: 12 %
Lymphs Abs: 1.2 10*3/uL (ref 0.7–4.0)
MCH: 28.9 pg (ref 26.0–34.0)
MCHC: 30.8 g/dL (ref 30.0–36.0)
MCV: 93.7 fL (ref 80.0–100.0)
Monocytes Absolute: 0.6 10*3/uL (ref 0.1–1.0)
Monocytes Relative: 6 %
Neutro Abs: 7.9 10*3/uL — ABNORMAL HIGH (ref 1.7–7.7)
Neutrophils Relative %: 79 %
Platelets: 172 10*3/uL (ref 150–400)
RBC: 4.15 MIL/uL (ref 3.87–5.11)
RDW: 13.8 % (ref 11.5–15.5)
WBC: 9.9 10*3/uL (ref 4.0–10.5)
nRBC: 0 % (ref 0.0–0.2)

## 2018-01-03 LAB — COMPREHENSIVE METABOLIC PANEL
ALBUMIN: 2.6 g/dL — AB (ref 3.5–5.0)
ALT: 15 U/L (ref 0–44)
AST: 15 U/L (ref 15–41)
Alkaline Phosphatase: 51 U/L (ref 38–126)
Anion gap: 9 (ref 5–15)
BUN: 32 mg/dL — ABNORMAL HIGH (ref 8–23)
CHLORIDE: 109 mmol/L (ref 98–111)
CO2: 24 mmol/L (ref 22–32)
Calcium: 8.4 mg/dL — ABNORMAL LOW (ref 8.9–10.3)
Creatinine, Ser: 1.16 mg/dL — ABNORMAL HIGH (ref 0.44–1.00)
GFR calc Af Amer: 54 mL/min — ABNORMAL LOW (ref >60)
GFR calc non Af Amer: 46 mL/min — ABNORMAL LOW (ref >60)
Glucose, Bld: 155 mg/dL — ABNORMAL HIGH (ref 70–99)
Potassium: 3.9 mmol/L (ref 3.5–5.1)
Sodium: 142 mmol/L (ref 135–145)
Total Bilirubin: 1.2 mg/dL (ref 0.3–1.2)
Total Protein: 5.9 g/dL — ABNORMAL LOW (ref 6.5–8.1)

## 2018-01-03 LAB — GLUCOSE, CAPILLARY
Glucose-Capillary: 172 mg/dL — ABNORMAL HIGH (ref 70–99)
Glucose-Capillary: 206 mg/dL — ABNORMAL HIGH (ref 70–99)
Glucose-Capillary: 164 mg/dL — ABNORMAL HIGH (ref 70–99)
Glucose-Capillary: 173 mg/dL — ABNORMAL HIGH (ref 70–99)

## 2018-01-03 LAB — PHOSPHORUS: Phosphorus: 3 mg/dL (ref 2.5–4.6)

## 2018-01-03 LAB — MAGNESIUM: Magnesium: 2.2 mg/dL (ref 1.7–2.4)

## 2018-01-03 MED ORDER — LOSARTAN POTASSIUM 50 MG PO TABS
100.0000 mg | ORAL_TABLET | Freq: Every day | ORAL | Status: DC
  Administered 2018-01-03 – 2018-01-10 (×8): 100 mg via ORAL
  Filled 2018-01-03 (×8): qty 2

## 2018-01-03 MED ORDER — AMLODIPINE BESYLATE 5 MG PO TABS
5.0000 mg | ORAL_TABLET | Freq: Every day | ORAL | Status: DC
  Administered 2018-01-03 – 2018-01-10 (×8): 5 mg via ORAL
  Filled 2018-01-03 (×8): qty 1

## 2018-01-03 MED ORDER — FUROSEMIDE 10 MG/ML IJ SOLN
60.0000 mg | Freq: Once | INTRAMUSCULAR | Status: AC
  Administered 2018-01-03: 60 mg via INTRAVENOUS
  Filled 2018-01-03: qty 6

## 2018-01-03 MED ORDER — LIP MEDEX EX OINT: 1.0000 "application " | TOPICAL_OINTMENT | CUTANEOUS | Status: DC | PRN

## 2018-01-03 NOTE — Progress Notes (Signed)
Paged on call surgery, Dr. Marlou Starks to make him aware of the pt abdominal x-ray results. He said NG tube was not needed and did not give any new orders.

## 2018-01-03 NOTE — Progress Notes (Signed)
Rebecca Rich [id number] Apr 07, 1945  CARE TEAM:  PCP: Burnis Medin, MD  Outpatient Care Team: Patient Care Team: Panosh, Standley Brooking, MD as PCP - General Alphonsa Overall, MD (General Surgery) Carol Ada, MD (Gastroenterology) Gean Birchwood, DPM (Podiatry) Adrian Prows, MD as Consulting Physician (Cardiology) Juanita Craver, MD as Consulting Physician (Gastroenterology) Zadie Rhine Clent Demark, MD as Consulting Physician (Ophthalmology)  Inpatient Treatment Team: Treatment Team: Attending Provider: Edison Pace, Md, MD; Rounding Team: [place], Md, MD; Rounding Team: Fatima Blank, MD; Registered Nurse: Eliane Decree, RN; Technician: Bethel Born, NT; Registered Nurse: Cyril Loosen, RN   Problem List:   Principal Problem:   Acute perforated appendicitis w abscess s/p lap appendectomy 12/30/2017 Active Problems:   Diabetes mellitus type 2 in obese ([place])   HYPERLIPIDEMIA   Morbid obesity with BMI of 50.0-59.9, adult ([place])   OSA on CPAP   Essential hypertension   Idiopathic cardiomegaly   Hypothyroid   Hx of gout   Mild aortic stenosis   SVT (supraventricular tachycardia) ([place])   4 Days Post-Op  12/30/2017  Procedure(s): APPENDECTOMY LAPAROSCOPIC    Assessment  Sepsis from Acute appendicitis perforation/localized abscess Laparoscopic appendectomy, drain placement, 12/30/2017 Dr. Stark Klein.  Slowly improving but guarded  [facility] Stay = 4 days)  Plan:  Offer full liquid/dysphagia 1 diet.  Did have a bowel movement but still with some nausea.  Would hold there until nausea abates.  Low threshold to go back down to sips of feels worse.  ID: Maxipime/Flagyl - Started 12/4>>Zosyn 12/5=>>.  Leukocytosis resolving.   Follow.  If persistently increasing or evidence of ileus or other concerns, consider repeat CT scan around postoperative day #5 to rule out abscess, etc.  Postop SVT placed on Cardizem/postiive Troponin 1.11 >>1.09 >> 0.75 >> 0.75 Hx of Mild AR/AS, grade II  diastolic dysfunction, EF 19% 12/2014 Tachycardia resolved.  Most likely some mild demand ischemia.  Cardiology underwhelmed and signed off.  Acute renal failure resolving.  Cr almost WNLRenal ultrasound shows no obstruction, reassuring.  FEN: IV fluids.  Liquid diet.  Okay to advance to full liquid diet as/dysphagia 1 diet as tolerated.  Suspected will go slowly.  Nausea control.  Consider transferring to floor if remains hemodynamically stable with no recurrent tachycardia today  DVT: Lovenox  Follow-up: TBD      30 minutes spent in review, evaluation, examination, counseling, and coordination of care.  More than 50% of that time was spent in counseling.  01/03/2018    Subjective: (Chief complaint) Sitting up in chair using nasal CPAP.  Tired.  Small bowel movement.  Not much of an appetite.  Friend and stepdown nurse at bedside..  Objective:  Vital signs:  Vitals:   01/03/18 0400 01/03/18 0500 01/03/18 0600 01/03/18 0625  BP: (!) 158/60 (!) 162/62 (!) 170/61 (!) 170/61  Pulse: 93 98 96   Resp: 18 17 (!) 29   Temp: 99.4 F (37.4 C)     TempSrc: Oral     SpO2: 96% 96% 98%   Weight:  (!) 141.6 kg    Height:        Last BM Date: 01/02/18  Intake/Output   Yesterday:  12/07 0701 - 12/08 0700 In: 1569 [I.V.:1418.7; IV Piggyback:150.2] Out: 2870 [Urine:2825; Drains:45] This shift:  No intake/output data recorded.  Bowel function:  Flatus: YES  BM:  YES  Drain: Serosanguinous   Physical Exam:  General: Pt awake/alert/oriented x4 in no acute distress.  Tired but not particularly toxic. Eyes:  PERRL, normal EOM.  Sclera clear.  No icterus Neuro: CN II-XII intact w/o focal sensory/motor deficits. Lymph: No head/neck/groin lymphadenopathy Psych:  No delerium/psychosis/paranoia HENT: Normocephalic, Mucus membranes moist.  No thrush Neck: Supple, No tracheal deviation Chest: No chest wall pain w good excursion CV:  Pulses intact.  Regular rhythm MS:  Normal AROM mjr joints.  No obvious deformity  Abdomen: Morbidly obese incisions closed from prior laparoscopic appendectomy Soft.  Moderately distended.  Mildly tender at incisions only.  No evidence of peritonitis.  No incarcerated hernias.  Ext:   No deformity.  1-2+ mjr edema.  No cyanosis Skin: No petechiae / purpura  Results:   Labs: Results for orders placed or performed during the hospital encounter of 12/30/17 (from the past 48 hour(s))  Glucose, capillary     Status: Abnormal   Collection Time: 01/01/18 12:04 PM  Result Value Ref Range   Glucose-Capillary 190 (H) 70 - 99 mg/dL   Comment 1 Notify RN    Comment 2 Document in Chart   Glucose, capillary     Status: Abnormal   Collection Time: 01/01/18  4:06 PM  Result Value Ref Range   Glucose-Capillary 155 (H) 70 - 99 mg/dL   Comment 1 Notify RN    Comment 2 Document in Chart   Urinalysis, Routine w reflex microscopic     Status: Abnormal   Collection Time: 01/01/18  4:15 PM  Result Value Ref Range   Color, Urine AMBER (A) YELLOW    Comment: BIOCHEMICALS MAY BE AFFECTED BY COLOR   APPearance CLEAR CLEAR   Specific Gravity, Urine 1.032 (H) 1.005 - 1.030   pH 5.0 5.0 - 8.0   Glucose, UA NEGATIVE NEGATIVE mg/dL   Hgb urine dipstick NEGATIVE NEGATIVE   Bilirubin Urine NEGATIVE NEGATIVE   Ketones, ur NEGATIVE NEGATIVE mg/dL   Protein, ur 30 (A) NEGATIVE mg/dL   Nitrite NEGATIVE NEGATIVE   Leukocytes, UA NEGATIVE NEGATIVE   RBC / HPF 0-5 0 - 5 RBC/hpf   WBC, UA 0-5 0 - 5 WBC/hpf   Bacteria, UA RARE (A) NONE SEEN   Squamous Epithelial / LPF 0-5 0 - 5   Amorphous Crystal PRESENT     Comment: Performed at [facility], [place] [address] [place], [place] [address]  Culture, Urine     Status: None   Collection Time: 01/01/18  4:15 PM  Result Value Ref Range   Specimen Description      URINE, RANDOM Performed at [facility], [place] [address] [place], [place] [address]    Special  Requests      NONE Performed at [facility], [place] [address], [place], [place] [address]    Culture      NO GROWTH Performed at [facility], [place] [address], [place], [place] [address]    Report Status 01/02/2018 FINAL   Na and K (sodium & potassium), rand urine     Status: None   Collection Time: 01/01/18  4:15 PM  Result Value Ref Range   Sodium, Ur <10 mmol/L    Comment: REPEATED TO VERIFY   Potassium Urine 64 mmol/L    Comment: Performed at [facility], [place] [address], [place], [place] [address]  Creatinine, urine, random     Status: None   Collection Time: 01/01/18  4:15 PM  Result Value Ref Range   Creatinine, Urine 257.54 mg/dL    Comment: Performed at [facility], [address] [place], [place] [address]  Osmolality, urine     Status: None   Collection Time: 01/01/18  4:15 PM  Result Value Ref Range   Osmolality, Ur 830 300 - 900 mOsm/kg    Comment: Performed at [place] [address] [place], [place] [address]  Basic metabolic panel     Status: Abnormal   Collection Time: 01/01/18  4:30 PM  Result Value Ref Range   Sodium 140 135 - 145 mmol/L   Potassium 4.2 3.5 - 5.1 mmol/L   Chloride 108 98 - 111 mmol/L   CO2 21 (L) 22 - 32 mmol/L   Glucose, Bld 160 (H) 70 - 99 mg/dL   BUN 43 (H) 8 - 23 mg/dL   Creatinine, Ser 1.52 (H) 0.44 - 1.00 mg/dL   Calcium 8.1 (L) 8.9 - 10.3 mg/dL   GFR calc non Af Amer 33 (L) >60 mL/min   GFR calc Af Amer 39 (L) >60 mL/min   Anion gap 11 5 - 15    Comment: Performed at [facility], [place] [address], [place], [place] [address]  Glucose, capillary     Status: Abnormal   Collection Time: 01/01/18  9:45 PM  Result Value Ref Range   Glucose-Capillary 147 (H) 70 - 99 mg/dL  Magnesium     Status: None   Collection Time: 01/02/18  3:20 AM  Result Value Ref Range   Magnesium 2.3 1.7 - 2.4 mg/dL    Comment: Performed at [facility], [place] [address] [place], [place] [address]  CBC with Differential/Platelet     Status: Abnormal   Collection Time: 01/02/18  3:20 AM  Result Value Ref Range   WBC 12.5 (H) 4.0 - 10.5 K/uL   RBC 4.10 3.87 - 5.11 MIL/uL   Hemoglobin 12.2 12.0 - 15.0 g/dL   HCT 39.8 36.0 - 46.0 %   MCV 97.1 80.0 - 100.0 fL   MCH 29.8 26.0 - 34.0 pg   MCHC 30.7 30.0 - 36.0 g/dL   RDW 14.0 11.5 - 15.5 %   Platelets 141 (L) 150 - 400 K/uL   nRBC 0.0 0.0 - 0.2 %   Neutrophils Relative % 90 %   Neutro Abs 11.2 (H) 1.7 - 7.7 K/uL   Lymphocytes Relative 5 %   Lymphs Abs 0.6 (L) 0.7 - 4.0 K/uL   Monocytes Relative 4 %   Monocytes Absolute 0.5 0.1 - 1.0 K/uL   Eosinophils Relative 0 %   Eosinophils Absolute 0.0 0.0 - 0.5 K/uL   Basophils Relative 0 %   Basophils Absolute 0.0 0.0 - 0.1 K/uL   Immature Granulocytes 1 %   Abs Immature Granulocytes 0.09 (H) 0.00 - 0.07 K/uL    Comment: Performed at [facility], [place] [address] [place] [address] [place],  [address]  Comprehensive metabolic panel     Status: Abnormal   Collection Time: 01/02/18  3:20 AM  Result Value Ref Range   Sodium 139 135 - 145 mmol/L   Potassium 4.4 3.5 - 5.1 mmol/L   Chloride 110 98 - 111 mmol/L   CO2 22 22 - 32 mmol/L   Glucose, Bld 183 (H) 70 - 99 mg/dL   BUN 40 (H) 8 - 23 mg/dL   Creatinine, Ser 1.28 (H) 0.44 - 1.00 mg/dL   Calcium 8.0 (L) 8.9 - 10.3 mg/dL   Total Protein 6.3 (L) 6.5 - 8.1 g/dL   Albumin 2.8 (L) 3.5 - 5.0  g/dL   AST 12 (L) 15 - 41 U/L   ALT 13 0 - 44 U/L   Alkaline Phosphatase 47 38 - 126 U/L   Total Bilirubin 0.7 0.3 - 1.2 mg/dL   GFR calc non Af Amer 41 (L) >60 mL/min   GFR calc Af Amer 48 (L) >60 mL/min   Anion gap 7 5 - 15    Comment: Performed at [facility], [place] [address], [place], [place] [address]  Phosphorus     Status: Abnormal   Collection Time: 01/02/18  3:20 AM  Result Value Ref Range   Phosphorus 2.4 (L) 2.5 - 4.6 mg/dL    Comment: Performed at [facility], [place] [address], [place], [place] [address]  Glucose, capillary     Status: Abnormal   Collection Time: 01/02/18  7:32 AM  Result Value Ref Range   Glucose-Capillary 178 (H) 70 - 99 mg/dL   Comment 1 Notify RN    Comment 2 Document in Chart   Glucose, capillary     Status: Abnormal   Collection Time: 01/02/18 11:29 AM  Result Value Ref Range   Glucose-Capillary 164 (H) 70 - 99 mg/dL  TSH     Status: Abnormal   Collection Time: 01/02/18 12:55 PM  Result Value Ref Range   TSH 4.906 (H) 0.350 - 4.500 uIU/mL    Comment: Performed by a 3rd Generation assay with a functional sensitivity of <=0.01 uIU/mL. Performed at [facility], [place] [address] [place], [place] [address]   Glucose, capillary     Status: Abnormal   Collection Time: 01/02/18  3:46 PM  Result Value Ref Range   Glucose-Capillary 140 (H) 70 - 99 mg/dL   Comment 1 Notify RN    Comment 2 Document in Chart   Glucose, capillary     Status: Abnormal   Collection Time: 01/02/18  9:06 PM  Result Value Ref Range   Glucose-Capillary 148 (H) 70 - 99 mg/dL  Magnesium     Status: None   Collection Time: 01/03/18  3:37 AM  Result Value Ref Range   Magnesium 2.2 1.7 - 2.4 mg/dL    Comment: Performed at [facility], [place] [address], [place], [place] [address]  CBC with Differential/Platelet     Status: Abnormal   Collection Time: 01/03/18  3:37 AM  Result Value Ref Range   WBC 9.9 4.0 - 10.5 K/uL   RBC 4.15 3.87 - 5.11 MIL/uL   Hemoglobin 12.0 12.0 - 15.0 g/dL   HCT 38.9 36.0 - 46.0 %   MCV 93.7 80.0 - 100.0 fL   MCH 28.9 26.0 - 34.0 pg   MCHC 30.8 30.0 - 36.0 g/dL   RDW 13.8 11.5 - 15.5 %   Platelets 172 150 - 400 K/uL   nRBC 0.0 0.0 - 0.2 %   Neutrophils Relative % 79 %   Neutro Abs 7.9 (H) 1.7 - 7.7 K/uL   Lymphocytes Relative 12 %   Lymphs Abs 1.2 0.7 - 4.0 K/uL   Monocytes Relative 6 %   Monocytes Absolute 0.6 0.1 - 1.0 K/uL   Eosinophils Relative 2 %    Eosinophils Absolute 0.2 0.0 - 0.5 K/uL   Basophils Relative 0 %   Basophils Absolute 0.0 0.0 - 0.1 K/uL   Immature Granulocytes 1 %   Abs Immature Granulocytes 0.12 (H) 0.00 - 0.07 K/uL    Comment: Performed at [facility], [place]  [address] [place], [place] [address]  Comprehensive metabolic panel     Status: Abnormal   Collection Time: 01/03/18  3:37 AM  Result Value Ref Range   Sodium 142 135 - 145 mmol/L   Potassium 3.9 3.5 - 5.1 mmol/L   Chloride 109 98 - 111 mmol/L   CO2 24 22 - 32 mmol/L   Glucose, Bld 155 (H) 70 - 99 mg/dL   BUN 32 (H) 8 - 23 mg/dL   Creatinine, Ser 1.16 (H) 0.44 - 1.00 mg/dL   Calcium 8.4 (L) 8.9 - 10.3 mg/dL   Total Protein 5.9 (L) 6.5 - 8.1 g/dL   Albumin 2.6 (L) 3.5 - 5.0 g/dL   AST 15 15 - 41 U/L   ALT 15 0 - 44 U/L   Alkaline Phosphatase 51 38 - 126 U/L   Total Bilirubin 1.2 0.3 - 1.2 mg/dL   GFR calc non Af Amer 46 (L) >60 mL/min   GFR calc Af Amer 54 (L) >60 mL/min   Anion gap 9 5 - 15    Comment: Performed at [facility], [place] [address], [place], [place] [address]  Phosphorus     Status: None   Collection Time: 01/03/18  3:37 AM  Result Value Ref Range   Phosphorus 3.0 2.5 - 4.6 mg/dL    Comment: Performed at [facility], [place] [address], [place], [place] [address]    Imaging / Studies: US Renal  Result Date: 01/01/2018 CLINICAL DATA:  Acute kidney injury EXAM: RENAL / URINARY TRACT ULTRASOUND COMPLETE COMPARISON:  None. FINDINGS: Right Kidney: Renal measurements: 11.5 x 5.3 x 6.4 cm = volume: 202 mL . Echogenicity within normal limits. No mass or hydronephrosis visualized. Left Kidney: Renal measurements: 12.3 x 6 x 4.5 cm = volume: 174 mL. Echogenicity within normal limits. No mass or hydronephrosis visualized. Bladder: Bladder not seen, presumably decompressed. IMPRESSION: Normal renal ultrasound. Electronically Signed   By: Franki Cabot M.D.   On: 01/01/2018 09:58    Medications /  Allergies: per chart  Antibiotics: Anti-infectives (From admission, onward)   Start     Dose/Rate Route Frequency Ordered Stop   12/31/17 0100  piperacillin-tazobactam (ZOSYN) IVPB 3.375 g     3.375 g 12.5 mL/hr over 240 Minutes Intravenous Every 8 hours 12/31/17 0055     12/30/17 1045  ceFEPIme (MAXIPIME) 2 g in sodium chloride 0.9 % 100 mL IVPB     2 g 200 mL/hr over 30 Minutes Intravenous  Once 12/30/17 1044 12/30/17 1342   12/30/17 1045  metroNIDAZOLE (FLAGYL) IVPB 500 mg  Status:  Discontinued     500 mg 100 mL/hr over 60 Minutes Intravenous Every 8 hours 12/30/17 1044 12/31/17 0055        Note: Portions of this report may have been transcribed using voice recognition software. Every effort was made to ensure accuracy; however, inadvertent computerized transcription errors may be present.   Any transcriptional errors that result from this process are unintentional.     Adin Hector, MD, FACS, MASCRS Gastrointestinal and Minimally Invasive Surgery    [address], [place], [place] [address] [phone] Main / Paging [phone] Fax

## 2018-01-03 NOTE — Progress Notes (Signed)
PROGRESS NOTE    Rebecca Rich  VQM:[id number] DOB: [birth year] DOA: 12/30/2017 PCP: Burnis Medin, MD   Brief Narrative:  The patient is 72 year old female with history of morbid obesity, sleep apnea, hypertension, diabetes mellitus type 2 presents to the ER because of 2 days of abdominal pain mostly in the lower quadrants with nausea.  Pain has been worsening with increased on minimal movement.  Had gone to urgent care and was referred to the ER.  In the ER CT of the abdomen and pelvis done shows possible perforated appendicitis and was taken to the OR.  Before taken to the OR patient had a brief run of SVT and was placed on Cardizem infusion.  Following the surgery patient's heart rate is in the normal range sinus.  Medicine was consulted for assistance and medical management.   Assessment & Plan:   Principal Problem:   Acute perforated appendicitis w abscess s/p lap appendectomy 12/30/2017 Active Problems:   Diabetes mellitus type 2 in obese ([place])   HYPERLIPIDEMIA   Morbid obesity with BMI of 50.0-59.9, adult (HCC)   OSA on CPAP   Essential hypertension   Idiopathic cardiomegaly   Hypothyroid   Hx of gout   Mild aortic stenosis   SVT (supraventricular tachycardia) (HCC)  Diabetes mellitus type 2  -We will keep patient on sliding scale coverage with resistant scale AC -Closely follow CBGs. -CBG's ranging from 140-206  Hypertension  -We will keep patient on PRN IV hydralazine and held off antihypertensives due to low normal blood pressure initially. -Given dose of IV Lasix yesterday and willl give another one today -Likely can be restarted on her home antihypertensive medications now and started back on Losartan 100 mg po Daily -Continue to hold Chlorthalidone -Avoid Hypotension  Sleep Apnea  -C/w  CPAP  Sepsis from perforated appendix and developing ileus -Sepsis physiology is improved -Patient is status post laparoscopic appendectomy and drain placement on 12/30/2017 by  Dr. Stark Klein -Appreciate general surgery input patient is on Zosyn and clear liquids and general surgery going to a full liquid diet/dysphagia 1 diet but patient was having significant nausea this morning -Continue with nausea control -Per general surgery of persistently elevated white count or evidence of ileus or other concerns consider repeating a CT scan of few days; Has more abdominal Distention -Checked KUB today and showed "Gastric and small bowel distension, favored to represent adynamic ileus. Low-grade partial small bowel obstruction could look similar.Possible right upper quadrant free intraperitoneal air, presumably Postoperative." -Likely to repeat CT Scan of Abdomen tomorrow -Will defer to Surgery for possible NG -Continue to Monitor Carefully -Still remains Nauseous; Diet Advancement per Surgery but told to come back to n.p.o. except meds and sips -Continue to monitor closely.  Follow lactate.  Probable Ileus -Diet per General Surgery -C/w Antiemetics with IV Zofran and IV Compazine -Bowel Regimen per Surgery and they are providing patient with 10 mg of Bisacodyl Suppositories daily  Hypothyroidism  -C/w Synthroid 75 mcg p.o. daily  Brief run of SVT  -Which improved with IV Cardizem.   -Presently in sinus rhythm.   -Check TSH and was 4.906 -Troponins elevated in the setting of SVT but trending down -Continue monitor and check free T4 and T3  Elevated Troponin -Suspect Demand ischemia in the Setting of SVT and Perforated Appendicitis as patient has denied any CP -Troponin Trending Down -Cardiology consulted and recommending ECHO -ECHOCardiogram done and showed Normal EF of 60-65% and G1DD with Normal Wall motion and no  wall motion abnormalities  Super Morbid Obesity -Estimated body mass index is 58.98 kg/m as calculated from the following:   Height as of this encounter: 5\' 1"  (1.549 m).   Weight as of this encounter: 141.6 kg. -Weight Loss Counseling  Given  Poor Urine Output, improved  -Foley placed for Measurement and Strict I's/O's -Bolused 3 Liters of NS  -IV Maintenance Fluide of NS + 30 mEQ of KCl at 100 mL/hr reduced to 50 mL/hr and stopped today -Given a dose of IV Lasix 60 mg yesterday morning and had good urine output and will give another dose today -Renal U/S Normal -Cr slightly bumped since but improved back to 1.52 is now even better at 1.16 -? Mediated by SVT and Surgery -If Cr worsening will obtain a Renal Consultation  -Continue to Monitor Carefully and exteremly closely -Check UA and Urine Cx ; urinalysis not really remarkable but did show specific gravity 1.32 -Urine culture still pending -Urine Osm was 830, urine potassium 64, urine sodium was less than 10, urine creatinine is 257.4 -Has improved output since her Lasix and will continue to monitor her volume status very carefully given another dose today -General surgery advancing her diet slowly -Stopped IV fluids as she appeared euvolemic and slightly hypervolemic and will defer to general surgery restart  DVT prophylaxis: Enoxaparin per Primary Code Status: FULL CODE Family Communication: Discussed with Friend at bedside Disposition Plan: Per Primary Team  Consultants:   [facility]  Cardiology   Procedures:  Renal U/S  ECHOCARDIOGRAM ------------------------------------------------------------------- Study Conclusions  - Left ventricle: The cavity size was normal. There was moderate   concentric hypertrophy. Systolic function was normal. The   estimated ejection fraction was in the range of 60% to 65%. Wall   motion was normal; there were no regional wall motion   abnormalities. Doppler parameters are consistent with abnormal   left ventricular relaxation (grade 1 diastolic dysfunction).   Doppler parameters are consistent with elevated ventricular   end-diastolic filling pressure. - Aortic valve: Trileaflet; moderately thickened, moderately    calcified leaflets. 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 There was moderate   regurgitation. Mean gradient (S): 10 mm Hg. Peak gradient (S): 19   mm Hg. Valve area (VTI): 2.44 cm^2. Valve area (Vmax): 2.54 cm^2.   Valve area (Vmean): 2.63 cm^2. - Mitral valve: Calcified annulus. Mildly thickened leaflets .   There was no regurgitation. - Right ventricle: The cavity size was mildly dilated. Wall   thickness was normal. Systolic function was mildly reduced. - Tricuspid valve: There was mild regurgitation. - Pulmonary arteries: Systolic pressure was within the normal   range. - Pericardium, extracardiac: There was no pericardial effusion.  Antimicrobials:  Anti-infectives (From admission, onward)   Start     Dose/Rate Route Frequency Ordered Stop   12/31/17 0100  piperacillin-tazobactam (ZOSYN) IVPB 3.375 g     3.375 g 12.5 mL/hr over 240 Minutes Intravenous Every 8 hours 12/31/17 0055     12/30/17 1045  ceFEPIme (MAXIPIME) 2 g in sodium chloride 0.9 % 100 mL IVPB     2 g 200 mL/hr over 30 Minutes Intravenous  Once 12/30/17 1044 12/30/17 1342   12/30/17 1045  metroNIDAZOLE (FLAGYL) IVPB 500 mg  Status:  Discontinued     500 mg 100 mL/hr over 60 Minutes Intravenous Every 8 hours 12/30/17 1044 12/31/17 0055     Subjective: Seen and Examined this morning states she is still feeling nauseous and her abdomen is more distended and taut.  States that  she had a bowel movement after suppository but still feels distended.  No chest pain but does state that her abdomen is also hurting.  Sitting in a chair and had no trouble urinating and has had significant urine output since Lasix.  No other concerns or complaints at this time except her nausea and abdominal pain.  Objective: Vitals:   01/03/18 1000 01/03/18 1100 01/03/18 1200 01/03/18 1300  BP: (!) 182/71 (!) 153/57 (!) 156/46 (!) 148/60  Pulse: (!) 104 (!) 102 (!) 104 (!) 103  Resp: (!) 24 15 (!) 28 (!) 25  Temp:      TempSrc:      SpO2: 93% 94% 95%  96%  Weight:      Height:        Intake/Output Summary (Last 24 hours) at 01/03/2018 1418 Last data filed at 01/03/2018 1130 Gross per 24 hour  Intake 1232.97 ml  Output 2565 ml  Net -1332.03 ml   Filed Weights   01/01/18 0700 01/02/18 0500 01/03/18 0500  Weight: 135.1 kg (!) 136.1 kg (!) 141.6 kg   Examination: Physical Exam:  Constitutional: Well-nourished, well-developed morbidly obese female currently in some distress appears very comfortable and still remains nauseous Eyes: Lids and conjunctive are normal.  Sclera are anicteric ENMT: External ears and nose appear normal.  Grossly normal hearing Neck: Appears supple no JVD Respiratory: Diminished to auscultation bilaterally no appreciable wheezing, rales, rhonchi.  Patient was not tachypneic using accessory muscles to breathe Cardiovascular: Slightly tachycardic rate and rhythm but is sinus rhythm.  Has trace lower extremity edema. Abdomen: Soft, tender to palpate.  Abdomen is taut and distended today.  Bowel sounds present but slightly diminished and does have hypertympanic bowel sounds to percussion.  Has a JP drain in the left side GU: Deferred Musculoskeletal: No contractures or cyanosis.  No joint fluid noted Skin: Skin is warm and dry no appreciable rashes or lesions limited skin evaluation.  Does have a skin tag on her neck Neurologic: Cranial nerves II through XII grossly intact no appreciable focal deficits Psychiatric: Still slightly withdrawn.  Normal judgment and insight. appears somewhat anxious  Data Reviewed: I have personally reviewed following labs and imaging studies  CBC: Recent Labs  Lab 12/30/17 1101 12/31/17 0314 01/01/18 0328 01/02/18 0320 01/03/18 0337  WBC 15.1* 12.6* 11.7* 12.5* 9.9  NEUTROABS 14.2*  --   --  11.2* 7.9*  HGB 15.1* 12.5 13.2 12.2 12.0  HCT 46.9* 39.5 41.9 39.8 38.9  MCV 93.6 95.4 95.2 97.1 93.7  PLT 147* 120* 126* 141* 056   Basic Metabolic Panel: Recent Labs  Lab  12/31/17 0314 01/01/18 0328 01/01/18 1630 01/02/18 0320 01/03/18 0337  NA 140 139 140 139 142  K 3.4* 3.9 4.2 4.4 3.9  CL 108 110 108 110 109  CO2 22 18* 21* 22 24  GLUCOSE 258* 190* 160* 183* 155*  BUN 34* 43* 43* 40* 32*  CREATININE 1.49* 1.63* 1.52* 1.28* 1.16*  CALCIUM 7.8* 8.2* 8.1* 8.0* 8.4*  MG 1.5* 2.3  --  2.3 2.2  PHOS  --   --   --  2.4* 3.0   GFR: Estimated Creatinine Clearance: 57.3 mL/min (A) (by C-G formula based on SCr of 1.16 mg/dL (H)). Liver Function Tests: Recent Labs  Lab 12/30/17 1059 01/02/18 0320 01/03/18 0337  AST 31 12* 15  ALT 17 13 15   ALKPHOS 70 47 51  BILITOT 1.7* 0.7 1.2  PROT 7.9 6.3* 5.9*  ALBUMIN 4.0 2.8* 2.6*  Recent Labs  Lab 12/30/17 1059  LIPASE 37   No results for input(s): AMMONIA in the last 168 hours. Coagulation Profile: No results for input(s): INR, PROTIME in the last 168 hours. Cardiac Enzymes: Recent Labs  Lab 12/31/17 0726 12/31/17 1001 12/31/17 1556 12/31/17 2106  TROPONINI 1.11* 1.09* 0.75* 0.75*   BNP (last 3 results) No results for input(s): PROBNP in the last 8760 hours. HbA1C: No results for input(s): HGBA1C in the last 72 hours. CBG: Recent Labs  Lab 01/02/18 1129 01/02/18 1546 01/02/18 2106 01/03/18 0736 01/03/18 1145  GLUCAP 164* 140* 148* 172* 206*   Lipid Profile: No results for input(s): CHOL, HDL, LDLCALC, TRIG, CHOLHDL, LDLDIRECT in the last 72 hours. Thyroid Function Tests: Recent Labs    01/02/18 1255  TSH 4.906*   Anemia Panel: No results for input(s): VITAMINB12, FOLATE, FERRITIN, TIBC, IRON, RETICCTPCT in the last 72 hours. Sepsis Labs: Recent Labs  Lab 12/30/17 1106 12/30/17 1305 12/31/17 0726 12/31/17 1001  LATICACIDVEN 3.97* 2.95* 2.8* 2.3*    Recent Results (from the past 240 hour(s))  Urine culture     Status: None   Collection Time: 12/30/17  1:46 AM  Result Value Ref Range Status   Specimen Description   Final    URINE, RANDOM Performed at [facility] [address] [place], [place] [address]    Special Requests   Final    NONE Performed at [facility], [place] [address], [place], [place] [address]    Culture   Final    NO GROWTH Performed at [facility], [place] [address] [place], [place] [address]    Report Status 01/01/2018 FINAL  Final  Blood Culture (routine x 2)     Status: None (Preliminary result)   Collection Time: 12/30/17 11:01 AM  Result Value Ref Range Status   Specimen Description   Final    RIGHT ANTECUBITAL Performed at [facility] [address], [place], [place] [address]    Special Requests   Final    Blood Culture adequate volume BOTTLES DRAWN AEROBIC AND ANAEROBIC   Culture   Final    NO GROWTH 3 DAYS Performed at [facility], [place] [address] [place], [place] [address]    Report Status PENDING  Incomplete  Blood Culture (routine x 2)     Status: None (Preliminary result)   Collection Time: 12/30/17 11:01 AM  Result Value Ref Range Status   Specimen Description   Final    BLOOD LEFT HAND Performed at [facility] [address] [place], [place] [address]    Special Requests   Final    Blood Culture results may not be optimal due to an inadequate volume of blood received in culture bottles BOTTLES DRAWN AEROBIC AND ANAEROBIC   Culture   Final    NO GROWTH 3 DAYS Performed at [facility], [place] [address] [place], [place] [address]    Report Status PENDING  Incomplete  MRSA PCR Screening     Status: None   Collection Time: 12/31/17 12:53 AM  Result Value Ref Range Status   MRSA by PCR NEGATIVE NEGATIVE Final    Comment:        The GeneXpert MRSA Assay (FDA approved for NASAL specimens only), is one component of a comprehensive MRSA colonization surveillance program. It is not intended to diagnose MRSA infection nor to guide or monitor treatment for MRSA infections. Performed at  [facility], [place] [address], [place], [place] [address]   Culture, Urine     Status: None   Collection Time: 01/01/18  4:15 PM  Result Value Ref Range Status   Specimen Description   Final    URINE, RANDOM Performed at [facility] [address], [place], [place] [address]    Special Requests   Final    NONE Performed at [facility], [place] [address], [place], [place] [address]    Culture   Final    NO GROWTH Performed at [facility], [place] [address] [place], [place] [address]    Report Status 01/02/2018 FINAL  Final    Radiology Studies: Dg Abd 1 View  Result Date: 01/03/2018 CLINICAL DATA:  Abdominal distension. Postop for laparoscopic appendectomy. EXAM: ABDOMEN - 1 VIEW COMPARISON:  CT of 12/30/2017 FINDINGS: Two supine views. Suspect free intraperitoneal air in the right upper quadrant, postoperative. Moderate gastric distension. Small bowel loops are distended and gas-filled within the lower abdomen, including at 5.3 cm. Limitations secondary to patient body habitus. IMPRESSION: Gastric and small bowel distension, favored to represent adynamic ileus. Low-grade partial small bowel obstruction could look similar. Consider nasogastric tube placement. Mild limitations, as detailed above. Possible right upper quadrant free intraperitoneal air, presumably postoperative. Electronically Signed   By: Abigail Miyamoto M.D.   On: 01/03/2018 14:04   Scheduled Meds: . acetaminophen  1,000 mg Oral TID  . amLODipine  5 mg Oral Daily  . bisacodyl  10 mg Rectal Daily  . enoxaparin (LOVENOX) injection  40 mg Subcutaneous Q24H  . gabapentin  300 mg Oral BID  . insulin aspart  0-20 Units Subcutaneous TID WC  . levothyroxine  75 mcg Oral Q0600  . losartan  100 mg Oral Daily  . mouth rinse  15 mL Mouth Rinse BID   Continuous Infusions: . methocarbamol (ROBAXIN) IV    . piperacillin-tazobactam (ZOSYN)  IV 3.375 g (01/03/18 1323)    LOS: 4 days    Kerney Elbe, DO [facility] PAGER is on [place]  If 7PM-7AM, please contact night-coverage [email or web address] Password [email or web address] 01/03/2018, 2:18 PM

## 2018-01-04 LAB — COMPREHENSIVE METABOLIC PANEL
ALT: 22 U/L (ref 0–44)
AST: 22 U/L (ref 15–41)
Albumin: 2.6 g/dL — ABNORMAL LOW (ref 3.5–5.0)
Alkaline Phosphatase: 51 U/L (ref 38–126)
Anion gap: 10 (ref 5–15)
BUN: 28 mg/dL — ABNORMAL HIGH (ref 8–23)
CO2: 25 mmol/L (ref 22–32)
Calcium: 8.6 mg/dL — ABNORMAL LOW (ref 8.9–10.3)
Chloride: 104 mmol/L (ref 98–111)
Creatinine, Ser: 1.04 mg/dL — ABNORMAL HIGH (ref 0.44–1.00)
GFR calc Af Amer: >60 mL/min (ref >60)
GFR calc non Af Amer: 53 mL/min — ABNORMAL LOW (ref >60)
GLUCOSE: 187 mg/dL — AB (ref 70–99)
Potassium: 3.7 mmol/L (ref 3.5–5.1)
Sodium: 139 mmol/L (ref 135–145)
Total Bilirubin: 1 mg/dL (ref 0.3–1.2)
Total Protein: 5.8 g/dL — ABNORMAL LOW (ref 6.5–8.1)

## 2018-01-04 LAB — CBC WITH DIFFERENTIAL/PLATELET
Abs Immature Granulocytes: 0.22 10*3/uL — ABNORMAL HIGH (ref 0.00–0.07)
Basophils Absolute: 0 10*3/uL (ref 0.0–0.1)
Basophils Relative: 0 %
Eosinophils Absolute: 0.2 10*3/uL (ref 0.0–0.5)
Eosinophils Relative: 2 %
HCT: 37.6 % (ref 36.0–46.0)
Hemoglobin: 12 g/dL (ref 12.0–15.0)
IMMATURE GRANULOCYTES: 2 %
Lymphocytes Relative: 13 %
Lymphs Abs: 1.1 10*3/uL (ref 0.7–4.0)
MCH: 30.3 pg (ref 26.0–34.0)
MCHC: 31.9 g/dL (ref 30.0–36.0)
MCV: 94.9 fL (ref 80.0–100.0)
Monocytes Absolute: 0.8 10*3/uL (ref 0.1–1.0)
Monocytes Relative: 8 %
NEUTROS PCT: 75 %
NRBC: 0 % (ref 0.0–0.2)
Neutro Abs: 6.8 10*3/uL (ref 1.7–7.7)
Platelets: 182 10*3/uL (ref 150–400)
RBC: 3.96 MIL/uL (ref 3.87–5.11)
RDW: 13.9 % (ref 11.5–15.5)
WBC: 9 10*3/uL (ref 4.0–10.5)

## 2018-01-04 LAB — CULTURE, BLOOD (ROUTINE X 2)
Culture: NO GROWTH
Culture: NO GROWTH
Special Requests: ADEQUATE

## 2018-01-04 LAB — T4, FREE: Free T4: 0.73 ng/dL — ABNORMAL LOW (ref 0.82–1.77)

## 2018-01-04 LAB — GLUCOSE, CAPILLARY
GLUCOSE-CAPILLARY: 199 mg/dL — AB (ref 70–99)
Glucose-Capillary: 194 mg/dL — ABNORMAL HIGH (ref 70–99)
Glucose-Capillary: 188 mg/dL — ABNORMAL HIGH (ref 70–99)
Glucose-Capillary: 189 mg/dL — ABNORMAL HIGH (ref 70–99)

## 2018-01-04 LAB — PHOSPHORUS: Phosphorus: 3.4 mg/dL (ref 2.5–4.6)

## 2018-01-04 LAB — MAGNESIUM: Magnesium: 2 mg/dL (ref 1.7–2.4)

## 2018-01-04 LAB — PREALBUMIN: Prealbumin: 9.9 mg/dL — ABNORMAL LOW (ref 18–38)

## 2018-01-04 MED ORDER — POTASSIUM CHLORIDE CRYS ER 10 MEQ PO TBCR
10.0000 meq | EXTENDED_RELEASE_TABLET | Freq: Four times a day (QID) | ORAL | Status: DC
  Administered 2018-01-04 – 2018-01-10 (×26): 10 meq via ORAL
  Filled 2018-01-04 (×25): qty 1

## 2018-01-04 MED ORDER — ENOXAPARIN SODIUM 80 MG/0.8ML ~~LOC~~ SOLN
0.5000 mg/kg | SUBCUTANEOUS | Status: DC
  Administered 2018-01-05 – 2018-01-08 (×4): 70 mg via SUBCUTANEOUS
  Filled 2018-01-04 (×4): qty 0.8

## 2018-01-04 MED ORDER — FUROSEMIDE 10 MG/ML IJ SOLN
60.0000 mg | Freq: Once | INTRAMUSCULAR | Status: AC
  Administered 2018-01-04: 60 mg via INTRAVENOUS
  Filled 2018-01-04: qty 6

## 2018-01-04 NOTE — Progress Notes (Signed)
Inpatient Diabetes Program Recommendations  AACE/ADA: New Consensus Statement on Inpatient Glycemic Control (2015)  Target Ranges:  Prepandial:   less than 140 mg/dL      Peak postprandial:   less than 180 mg/dL (1-2 hours)      Critically ill patients:  140 - 180 mg/dL   Lab Results  Component Value Date   GLUCAP 188 (H) 01/04/2018   HGBA1C 7.0 (H) 12/31/2017    Review of Glycemic Control  Diabetes history: DM2 Outpatient Diabetes medications: None Current orders for Inpatient glycemic control: Novolog 0-20 units tidwc  Spoke with pt and sister this afternoon in ICU. Pt states she is burping a lot and had eaten a little bit of ice cream, and it didn't sit well on her stomach. Drinking cranberry juice and diet soda, along with cream of chcken soup, and potato soup. Requested applesauce when diet is advanced. Discussed HgbA1C of 7.0%. Pt checks blood sugars 3x/week and has f/u with PCP on a regular basis. Discussed importance of walking and how it affects her blood sugars. Answered questions. Discussed above with RN.  Thank you. Lorenda Peck, RD, LDN, CDE Inpatient Diabetes Coordinator [phone]

## 2018-01-04 NOTE — Progress Notes (Signed)
PROGRESS NOTE    Rebecca Rich  CWC:[id number] DOB: 04/02/1945 DOA: 12/30/2017 PCP: Burnis Medin, MD   Brief Narrative:  The patient is 72 year old female with history of morbid obesity, sleep apnea, hypertension, diabetes mellitus type 2 presents to the ER because of 2 days of abdominal pain mostly in the lower quadrants with nausea.  Pain has been worsening with increased on minimal movement.  Had gone to urgent care and was referred to the ER.  In the ER CT of the abdomen and pelvis done shows possible perforated appendicitis and was taken to the OR.  Before taken to the OR patient had a brief run of SVT and was placed on Cardizem infusion.  Following the surgery patient's heart rate is in the normal range sinus.  Medicine was consulted for assistance and medical management.   Assessment & Plan:   Principal Problem:   Acute perforated appendicitis w abscess s/p lap appendectomy 12/30/2017 Active Problems:   Diabetes mellitus type 2 in obese ([place])   HYPERLIPIDEMIA   Morbid obesity with BMI of 50.0-59.9, adult (HCC)   OSA on CPAP   Essential hypertension   Idiopathic cardiomegaly   Hypothyroid   Hx of gout   Mild aortic stenosis   SVT (supraventricular tachycardia) (HCC)  Diabetes Mellitus Type 2  -We will keep patient on sliding scale coverage with resistant scale AC -Closely follow CBGs. -CBG's ranging from 164-199  Hypertension  -We will keep patient on PRN IV hydralazine and held off antihypertensives due to low normal blood pressure initially. -Given IV Lasix 60 mg Daily for the last 2 days and will repeat today -Likely can be restarted on her home antihypertensive medications now and started back on Losartan 100 mg po Daily, continue with home amlodipine 5 mg p.o. daily and if necessary can be increased to 10 mg p.o. daily -Continue to hold Chlorthalidone and resume in AM -Avoid Hypotension  Sleep Apnea  -C/w  CPAP  Sepsis from perforated appendix and developing  ileus -Sepsis physiology is improved -Patient is status post laparoscopic appendectomy and drain placement on 12/30/2017 by Dr. Stark Klein -Appreciate general surgery input patient is on Zosyn and clear liquids and general surgery going to a full liquid diet/dysphagia 1 diet but patient was having significant nausea this morning -Continue with nausea control -Per general surgery of persistently elevated white count or evidence of ileus or other concerns consider repeating a CT scan of few days; Had more abdominal Distention and likely developed ileus but will defer to Surgery  -Checked KUB 01/03/18 and showed "Gastric and small bowel distension, favored to represent adynamic ileus. Low-grade partial small bowel obstruction could look similar.Possible right upper quadrant free intraperitoneal air, presumably Postoperative." -We will defer to general surgery to repeat a CT of the abdomen; per general surgery will hold on NG tube placement -Continue to Monitor Carefully -Still remains somewhat Nauseous; Diet Advancement per Surgery but told to come back to n.p.o. except meds and sips and is currently on Clears  -Continue to monitor closely.  Follow lactate.  Ileus -Diet per General Surgery -Ambulate and Mobilize -C/w Antiemetics with IV Zofran and IV Compazine -Bowel Regimen per Surgery and they are providing patient with 10 mg of Bisacodyl Suppositories daily -Foley to be D/C'd   Hypothyroidism  -C/w Synthroid 75 mcg p.o. daily  Brief run of SVT  -Which improved with IV Cardizem.   -Presently in sinus rhythm.   -Check TSH and was 4.906 -Troponins elevated in the setting  of SVT but trending down -Continue monitor and check free T4 and was 0.73 and T3 is still pending   Elevated Troponin -Suspect Demand ischemia in the Setting of SVT and Perforated Appendicitis as patient has denied any CP -Troponin Trending Down -Cardiology consulted and recommending ECHO -ECHOCardiogram done and  showed Normal EF of 60-65% and G1DD with Normal Wall motion and no wall motion abnormalities  Super Morbid Obesity -Estimated body mass index is 56.94 kg/m as calculated from the following:   Height as of this encounter: 5\' 1"  (1.549 m).   Weight as of this encounter: 136.7 kg. -Weight Loss Counseling Given  Poor Urine Output, much improved  -Foley placed for Measurement and Strict I's/O's -Bolused 3 Liters of NS  -IV Maintenance Fluide of NS + 30 mEQ of KCl at 100 mL/hr reduced to 50 mL/hr and stopped today -Given IV Lasix 60 mg Daily for the last 2 days and will repeat today -Renal U/S Normal -Cr slightly bumped since but improved back to 1.52 is now even better at 1.04 -? Mediated by SVT and Surgery -If Cr worsening will obtain a Renal Consultation  -Continue to Monitor Carefully and exteremly closely -Check UA and Urine Cx ; urinalysis not really remarkable but did show specific gravity 1.32 -Urine culture Showed NGTD -Patient is +7.096 Since admit -Urine Osm was 830, urine potassium 64, urine sodium was less than 10, urine creatinine is 257.4 -Has improved output since her Lasix and will continue to monitor her volume status very carefully given another dose today -General surgery advancing her diet slowly -Stopped IV fluids as she appeared euvolemic and slightly hypervolemic and will defer to general surgery restart  DVT prophylaxis: Enoxaparin per Primary Code Status: FULL CODE Family Communication: Discussed with Friend at bedside Disposition Plan: Per Primary Team but ok from a Medicine Standpoint to Transfer out of ICU  Consultants:   [facility]  Cardiology   Procedures:  Renal U/S  ECHOCARDIOGRAM ------------------------------------------------------------------- Study Conclusions  - Left ventricle: The cavity size was normal. There was moderate   concentric hypertrophy. Systolic function was normal. The   estimated ejection fraction was in the range of 60% to  65%. Wall   motion was normal; there were no regional wall motion   abnormalities. Doppler parameters are consistent with abnormal   left ventricular relaxation (grade 1 diastolic dysfunction).   Doppler parameters are consistent with elevated ventricular   end-diastolic filling pressure. - Aortic valve: Trileaflet; moderately thickened, moderately   calcified leaflets. 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 There was moderate   regurgitation. Mean gradient (S): 10 mm Hg. Peak gradient (S): 19   mm Hg. Valve area (VTI): 2.44 cm^2. Valve area (Vmax): 2.54 cm^2.   Valve area (Vmean): 2.63 cm^2. - Mitral valve: Calcified annulus. Mildly thickened leaflets .   There was no regurgitation. - Right ventricle: The cavity size was mildly dilated. Wall   thickness was normal. Systolic function was mildly reduced. - Tricuspid valve: There was mild regurgitation. - Pulmonary arteries: Systolic pressure was within the normal   range. - Pericardium, extracardiac: There was no pericardial effusion.  Antimicrobials:  Anti-infectives (From admission, onward)   Start     Dose/Rate Route Frequency Ordered Stop   12/31/17 0100  piperacillin-tazobactam (ZOSYN) IVPB 3.375 g     3.375 g 12.5 mL/hr over 240 Minutes Intravenous Every 8 hours 12/31/17 0055     12/30/17 1045  ceFEPIme (MAXIPIME) 2 g in sodium chloride 0.9 % 100 mL IVPB  2 g 200 mL/hr over 30 Minutes Intravenous  Once 12/30/17 1044 12/30/17 1342   12/30/17 1045  metroNIDAZOLE (FLAGYL) IVPB 500 mg  Status:  Discontinued     500 mg 100 mL/hr over 60 Minutes Intravenous Every 8 hours 12/30/17 1044 12/31/17 0055     Subjective: Seen and Examined this morning states her abdomen is still hurting and she is still nauseous and quantified it as an 8/10. Has been making more urine. No CP, SOB, Lightheadedness or dizziness.  She does admit to having some bowel movements but not passing any gas.  No other concerns or complaints at this time  Objective: Vitals:     01/04/18 0700 01/04/18 0800 01/04/18 0900 01/04/18 1200  BP: (!) 161/53 (!) 158/54 (!) 160/52   Pulse: 99 94 95   Resp: (!) 25 (!) 26 (!) 25   Temp:  98.5 F (36.9 C)  98.2 F (36.8 C)  TempSrc:  Oral  Oral  SpO2: 97% 96% 98%   Weight:      Height:        Intake/Output Summary (Last 24 hours) at 01/04/2018 1246 Last data filed at 01/04/2018 1238 Gross per 24 hour  Intake 1396.68 ml  Output 2220 ml  Net -823.32 ml   Filed Weights   01/02/18 0500 01/03/18 0500 01/04/18 0500  Weight: (!) 136.1 kg (!) 141.6 kg (!) 136.7 kg   Examination: Physical Exam:  Constitutional: Well-nourished, well-developed morbidly obese female currently in mild distress and appears very uncomfortable and about to get to the bedside commode Eyes: Lids and conjunctive are normal.  Sclera nonicteric ENMT: External ears and nose appear normal.  Grossly normal hearing Neck: Appears supple no JVD Respiratory: Diminished auscultation bilaterally with no appreciable wheezing, rales, rhonchi.  Patient not tachypneic or using any accessory muscles to breathe Cardiovascular: Regular rate and rhythm but site and the pressure is high.  Has trace lower extremity edema still Abdomen: Soft, tender to palpate and abdomen is taut and distended.  Bowel sounds are present slightly diminished.  She does have hypertympanic bowel sounds to percussion.  JP drain in the left side is drained about 5 mL's of blood-tinged GU: Deferred Musculoskeletal: No contractures or cyanosis.  No joint deformities noted Skin: Skin is warm and dry no appreciable rashes or lesions on a limited skin evaluation Neurologic: Cranial nerves II through XII grossly intact locatable focal deficits Psychiatric: Slightly anxious mood and affect.  Normal judgment intact.  Data Reviewed: I have personally reviewed following labs and imaging studies  CBC: Recent Labs  Lab 12/30/17 1101 12/31/17 0314 01/01/18 0328 01/02/18 0320 01/03/18 0337  01/04/18 0327  WBC 15.1* 12.6* 11.7* 12.5* 9.9 9.0  NEUTROABS 14.2*  --   --  11.2* 7.9* 6.8  HGB 15.1* 12.5 13.2 12.2 12.0 12.0  HCT 46.9* 39.5 41.9 39.8 38.9 37.6  MCV 93.6 95.4 95.2 97.1 93.7 94.9  PLT 147* 120* 126* 141* 172 902   Basic Metabolic Panel: Recent Labs  Lab 12/31/17 0314 01/01/18 0328 01/01/18 1630 01/02/18 0320 01/03/18 0337 01/04/18 0327  NA 140 139 140 139 142 139  K 3.4* 3.9 4.2 4.4 3.9 3.7  CL 108 110 108 110 109 104  CO2 22 18* 21* 22 24 25   GLUCOSE 258* 190* 160* 183* 155* 187*  BUN 34* 43* 43* 40* 32* 28*  CREATININE 1.49* 1.63* 1.52* 1.28* 1.16* 1.04*  CALCIUM 7.8* 8.2* 8.1* 8.0* 8.4* 8.6*  MG 1.5* 2.3  --  2.3 2.2 2.0  PHOS  --   --   --  2.4* 3.0 3.4   GFR: Estimated Creatinine Clearance: 62.5 mL/min (A) (by C-G formula based on SCr of 1.04 mg/dL (H)). Liver Function Tests: Recent Labs  Lab 12/30/17 1059 01/02/18 0320 01/03/18 0337 01/04/18 0327  AST 31 12* 15 22  ALT 17 13 15 22   ALKPHOS 70 47 51 51  BILITOT 1.7* 0.7 1.2 1.0  PROT 7.9 6.3* 5.9* 5.8*  ALBUMIN 4.0 2.8* 2.6* 2.6*   Recent Labs  Lab 12/30/17 1059  LIPASE 37   No results for input(s): AMMONIA in the last 168 hours. Coagulation Profile: No results for input(s): INR, PROTIME in the last 168 hours. Cardiac Enzymes: Recent Labs  Lab 12/31/17 0726 12/31/17 1001 12/31/17 1556 12/31/17 2106  TROPONINI 1.11* 1.09* 0.75* 0.75*   BNP (last 3 results) No results for input(s): PROBNP in the last 8760 hours. HbA1C: No results for input(s): HGBA1C in the last 72 hours. CBG: Recent Labs  Lab 01/03/18 1145 01/03/18 1624 01/03/18 2148 01/04/18 0755 01/04/18 1148  GLUCAP 206* 164* 173* 194* 199*   Lipid Profile: No results for input(s): CHOL, HDL, LDLCALC, TRIG, CHOLHDL, LDLDIRECT in the last 72 hours. Thyroid Function Tests: Recent Labs    01/02/18 1255 01/04/18 0327  TSH 4.906*  --   FREET4  --  0.73*   Anemia Panel: No results for input(s): VITAMINB12,  FOLATE, FERRITIN, TIBC, IRON, RETICCTPCT in the last 72 hours. Sepsis Labs: Recent Labs  Lab 12/30/17 1106 12/30/17 1305 12/31/17 0726 12/31/17 1001  LATICACIDVEN 3.97* 2.95* 2.8* 2.3*    Recent Results (from the past 240 hour(s))  Urine culture     Status: None   Collection Time: 12/30/17  1:46 AM  Result Value Ref Range Status   Specimen Description   Final    URINE, RANDOM Performed at [facility] [address], [place], [place] [address]    Special Requests   Final    NONE Performed at [facility], [place] [address], [place], [place] [address]    Culture   Final    NO GROWTH Performed at [facility], [place] [address], [place], [place] [address]    Report Status 01/01/2018 FINAL  Final  Blood Culture (routine x 2)     Status: None (Preliminary result)   Collection Time: 12/30/17 11:01 AM  Result Value Ref Range Status   Specimen Description   Final    RIGHT ANTECUBITAL Performed at [place] [address], [place], [place] [address]    Special Requests   Final    Blood Culture adequate volume BOTTLES DRAWN AEROBIC AND ANAEROBIC   Culture   Final    NO GROWTH 4 DAYS Performed at [facility], [place] [address] [place],  [address]    Report Status PENDING  Incomplete  Blood Culture (routine x 2)     Status: None (Preliminary result)   Collection Time: 12/30/17 11:01 AM  Result Value Ref Range Status   Specimen Description   Final    BLOOD LEFT HAND Performed at [facility] [address], [place],  [address]    Special Requests   Final    Blood Culture results may not be optimal due to an inadequate volume of blood received in culture bottles BOTTLES DRAWN AEROBIC AND ANAEROBIC   Culture   Final    NO GROWTH 4 DAYS Performed at [facility],  [address] [place], [place] [address]    Report Status PENDING  Incomplete  MRSA PCR Screening      Status: None   Collection Time: 12/31/17 12:53 AM  Result Value Ref Range Status   MRSA by PCR NEGATIVE NEGATIVE Final    Comment:        The GeneXpert MRSA Assay (FDA approved for NASAL specimens only), is one component of a comprehensive MRSA colonization surveillance program. It is not intended to diagnose MRSA infection nor to guide or monitor treatment for MRSA infections. Performed at [facility], [place] [address], [place], [place] [address]   Culture, Urine     Status: None   Collection Time: 01/01/18  4:15 PM  Result Value Ref Range Status   Specimen Description   Final    URINE, RANDOM Performed at [facility] [address] [place], [place] [address]    Special Requests   Final    NONE Performed at [facility], [place] [address] [place], [place] [address]    Culture   Final    NO GROWTH Performed at [facility], [place] [address] [place], [place] [address]    Report Status 01/02/2018 FINAL  Final    Radiology Studies: Dg Abd 1 View  Result Date: 01/03/2018 CLINICAL DATA:  Abdominal distension. Postop for laparoscopic appendectomy. EXAM: ABDOMEN - 1 VIEW COMPARISON:  CT of 12/30/2017 FINDINGS: Two supine views. Suspect free intraperitoneal air in the right upper quadrant, postoperative. Moderate gastric distension. Small bowel loops are distended and gas-filled within the lower abdomen, including at 5.3 cm. Limitations secondary to patient body habitus. IMPRESSION: Gastric and small bowel distension, favored to represent adynamic ileus. Low-grade partial small bowel obstruction could look similar. Consider nasogastric tube placement. Mild limitations, as detailed above. Possible right upper quadrant free intraperitoneal air, presumably postoperative. Electronically Signed   By: Abigail Miyamoto M.D.   On: 01/03/2018 14:04   Scheduled Meds: . acetaminophen  1,000 mg Oral TID  . amLODipine  5 mg Oral Daily  .  bisacodyl  10 mg Rectal Daily  . [START ON 01/05/2018] enoxaparin (LOVENOX) injection  0.5 mg/kg Subcutaneous Q24H  . gabapentin  300 mg Oral BID  . insulin aspart  0-20 Units Subcutaneous TID WC  . levothyroxine  75 mcg Oral Q0600  . losartan  100 mg Oral Daily  . mouth rinse  15 mL Mouth Rinse BID  . potassium chloride  10 mEq Oral QID   Continuous Infusions: . methocarbamol (ROBAXIN) IV    . piperacillin-tazobactam (ZOSYN)  IV Stopped (01/04/18 0905)    LOS: 5 days   Kerney Elbe, DO [facility] PAGER is on AMION  If 7PM-7AM, please contact night-coverage [email or web address] Password [email or web address] 01/04/2018, 12:46 PM

## 2018-01-04 NOTE — Progress Notes (Addendum)
5 Days Post-Op    CC:  Right abdominal pain.    Subjective: Uncomfortable in bed, CPAP is on.  She has no BS, complains of some nausea, but also has had 2 Bm's.  The BM's were formed but, no flatus.  Port sites OK, drain is serous.    Objective: Vital signs in last 24 hours: Temp:  [97.6 F (36.4 C)-98.5 F (36.9 C)] 98.5 F (36.9 C) (12/09 0800) Pulse Rate:  [77-104] 94 (12/09 0800) Resp:  [15-28] 26 (12/09 0800) BP: (139-182)/(43-71) 158/54 (12/09 0800) SpO2:  [93 %-100 %] 96 % (12/09 0800) Weight:  [136.7 kg] 136.7 kg (12/09 0500) Last BM Date: 01/03/18 Nothing PO recorded 1009 IV 1550 urine,  Drain 35 Stool x 2 recorded Afebrile, VSS Labs OK - glucose still up some, creatinine better, CBC normal and stable 1 View abd 12/8:Gastric and small bowel distension, favored to represent adynamic ileus. Low-grade partial small bowel obstruction could look similar.  Possible right upper quadrant free intraperitoneal air, presumably postoperative. Intake/Output from previous day: 12/08 0701 - 12/09 0700 In: 1009.7 [I.V.:201.6; IV Piggyback:158.1] Out: 1585 [Urine:1550; Drains:35] Intake/Output this shift: Total I/O In: 18.7 [IV Piggyback:18.7] Out: 155 [Urine:150; Drains:5]  General appearance: alert, cooperative and no distress Resp: on CPAP, clear anterior CArdiac:  3 strips on Telem review I just see SR. GI: very large abdomen no BS, no flatus, 2 BM, serous drainage from the JP.  NG out no vomiting  Lab Results:  Recent Labs    01/03/18 0337 01/04/18 0327  WBC 9.9 9.0  HGB 12.0 12.0  HCT 38.9 37.6  PLT 172 182    BMET Recent Labs    01/03/18 0337 01/04/18 0327  NA 142 139  K 3.9 3.7  CL 109 104  CO2 24 25  GLUCOSE 155* 187*  BUN 32* 28*  CREATININE 1.16* 1.04*  CALCIUM 8.4* 8.6*   PT/INR No results for input(s): LABPROT, INR in the last 72 hours.  Recent Labs  Lab 12/30/17 1059 01/02/18 0320 01/03/18 0337 01/04/18 0327  AST 31 12* 15 22  ALT 17  13 15 22   ALKPHOS 70 47 51 51  BILITOT 1.7* 0.7 1.2 1.0  PROT 7.9 6.3* 5.9* 5.8*  ALBUMIN 4.0 2.8* 2.6* 2.6*     Lipase     Component Value Date/Time   LIPASE 37 12/30/2017 1059     Medications: . acetaminophen  1,000 mg Oral TID  . amLODipine  5 mg Oral Daily  . bisacodyl  10 mg Rectal Daily  . enoxaparin (LOVENOX) injection  40 mg Subcutaneous Q24H  . gabapentin  300 mg Oral BID  . insulin aspart  0-20 Units Subcutaneous TID WC  . levothyroxine  75 mcg Oral Q0600  . losartan  100 mg Oral Daily  . mouth rinse  15 mL Mouth Rinse BID   . methocarbamol (ROBAXIN) IV    . piperacillin-tazobactam (ZOSYN)  IV 12.5 mL/hr at 01/04/18 0730   Anti-infectives (From admission, onward)   Start     Dose/Rate Route Frequency Ordered Stop   12/31/17 0100  piperacillin-tazobactam (ZOSYN) IVPB 3.375 g     3.375 g 12.5 mL/hr over 240 Minutes Intravenous Every 8 hours 12/31/17 0055     12/30/17 1045  ceFEPIme (MAXIPIME) 2 g in sodium chloride 0.9 % 100 mL IVPB     2 g 200 mL/hr over 30 Minutes Intravenous  Once 12/30/17 1044 12/30/17 1342   12/30/17 1045  metroNIDAZOLE (FLAGYL) IVPB 500 mg  Status:  Discontinued     500 mg 100 mL/hr over 60 Minutes Intravenous Every 8 hours 12/30/17 1044 12/31/17 0055     Assessment/Plan Hypertension AKI - new 1.04 (08/04/17) 1.58 >>1.49 >> 1.63 >> 1.04  this AM Type 2 diabetes Sleep apnea on CPAP Hypothyroid - synthroid replacement Morbid obesity 56.94   Sepsis Acute appendicitis perforation/localized abscess Post op ileus Laparoscopic appendectomy, drain placement, 12/30/2017 Dr. Stark Klein. Postop SVT placed on Cardizem/postiive Troponin 1.11 >>1.09 >> 0.75 >> 0.75 Hx of Mild AR/AS, grade II diastolic dysfunction, EF 14% 12/2014  FEN: IV fluids/full liquids - but taking almost noting and mostly  ID: Maxipime/Flagyl x112/4>>Zosyn 12/5=>>day 5 DVT: Lovenox Follow-up: TBD   Plan:  Discussed with Medicine, I would like to get foley out  later after Lasix, get her up and moving. She is still fluid overloaded, and getting more lasix this AM, will pull foley later today.  Tx to telem. PT/OT/Diabetes coordinator requested.        LOS: 5 days    Rebecca Rich 01/04/2018 [phone]

## 2018-01-04 NOTE — Progress Notes (Signed)
ANTICOAGULATION CONSULT NOTE - Initial Consult  Pharmacy Consult for :Lovenox Indication: VTE prophylaxis  No Known Allergies  Patient Measurements: Height: 5\' 1"  (154.9 cm) Weight: (!) 301 lb 5.9 oz (136.7 kg) IBW/kg (Calculated) : 47.8   Vital Signs: Temp: 98.5 F (36.9 C) (12/09 0800) Temp Source: Oral (12/09 0800) BP: 158/54 (12/09 0800) Pulse Rate: 94 (12/09 0800)  Labs: Recent Labs    01/02/18 0320 01/03/18 0337 01/04/18 0327  HGB 12.2 12.0 12.0  HCT 39.8 38.9 37.6  PLT 141* 172 182  CREATININE 1.28* 1.16* 1.04*    Estimated Creatinine Clearance: 62.5 mL/min (A) (by C-G formula based on SCr of 1.04 mg/dL (H)).   Medical History: Past Medical History:  Diagnosis Date  . Acute bronchospasm 08/24/2009  . Acute sphenoidal sinusitis 02/26/2010   Qualifier: Diagnosis of  By: Regis Bill MD, Standley Brooking   . CARDIAC MURMUR 12/08/2006  . COLONIC POLYPS, HX OF 12/08/2006  . DIABETES MELLITUS, TYPE II 12/08/2006  . Gallbladder/common duct stone, without infection, with obstruction 03/01/2010   removed ercp  . HYPERLIPIDEMIA 12/08/2006  . HYPERTENSION 12/08/2006  . 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 INFECTION, SKIN AND SOFT TISSUE 07/28/2008  . 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 LIVER FUNCTION TESTS, ABNORMAL 07/28/2008  . Morbid obesity ([place]) 12/08/2006  . OBESITY 09/24/2009  . OBSTRUCTIVE SLEEP APNEA 12/08/2006  . OSTEOARTHRITIS 12/08/2006  . RUQ PAIN 06/16/2008  . SHOULDER PAIN, RIGHT 02/07/2008  . Sleep apnea   . Swelling of limb 07/28/2008  . THYROID FUNCTION TEST, ABNORMAL 12/08/2006    Medications:  Lovenox 40 mg daily  Assessment: 72 y/o F with perforated appendicitis s/p lap appy ordered Lovenox for DVT ppx. Pharmacy consulted for dose adjustment due to BMI of 57.   Goal of Therapy:  Monitor platelets by anticoagulation protocol: Yes   Plan:  Lovenox 0.5 mg/kg daily. Will sign-off note writing and monitor peripherally.   Ulice Dash D 01/04/2018,9:19 AM

## 2018-01-05 LAB — CBC
HCT: 34.2 % — ABNORMAL LOW (ref 36.0–46.0)
Hemoglobin: 10.9 g/dL — ABNORMAL LOW (ref 12.0–15.0)
MCH: 29.8 pg (ref 26.0–34.0)
MCHC: 31.9 g/dL (ref 30.0–36.0)
MCV: 93.4 fL (ref 80.0–100.0)
Platelets: 200 10*3/uL (ref 150–400)
RBC: 3.66 MIL/uL — ABNORMAL LOW (ref 3.87–5.11)
RDW: 13.8 % (ref 11.5–15.5)
WBC: 7.9 10*3/uL (ref 4.0–10.5)
nRBC: 0 % (ref 0.0–0.2)

## 2018-01-05 LAB — GLUCOSE, CAPILLARY
GLUCOSE-CAPILLARY: 169 mg/dL — AB (ref 70–99)
Glucose-Capillary: 137 mg/dL — ABNORMAL HIGH (ref 70–99)
Glucose-Capillary: 185 mg/dL — ABNORMAL HIGH (ref 70–99)
Glucose-Capillary: 182 mg/dL — ABNORMAL HIGH (ref 70–99)

## 2018-01-05 LAB — BASIC METABOLIC PANEL
Anion gap: 7 (ref 5–15)
BUN: 23 mg/dL (ref 8–23)
CO2: 27 mmol/L (ref 22–32)
Calcium: 8.4 mg/dL — ABNORMAL LOW (ref 8.9–10.3)
Chloride: 102 mmol/L (ref 98–111)
Creatinine, Ser: 1.05 mg/dL — ABNORMAL HIGH (ref 0.44–1.00)
GFR calc Af Amer: >60 mL/min (ref >60)
GFR calc non Af Amer: 52 mL/min — ABNORMAL LOW (ref >60)
Glucose, Bld: 185 mg/dL — ABNORMAL HIGH (ref 70–99)
Potassium: 3.2 mmol/L — ABNORMAL LOW (ref 3.5–5.1)
Sodium: 136 mmol/L (ref 135–145)

## 2018-01-05 LAB — T3: T3, Total: 69 ng/dL — ABNORMAL LOW (ref 71–180)

## 2018-01-05 LAB — MAGNESIUM: Magnesium: 1.7 mg/dL (ref 1.7–2.4)

## 2018-01-05 LAB — PREALBUMIN: Prealbumin: 11.6 mg/dL — ABNORMAL LOW (ref 18–38)

## 2018-01-05 MED ORDER — POTASSIUM CHLORIDE CRYS ER 20 MEQ PO TBCR
40.0000 meq | EXTENDED_RELEASE_TABLET | Freq: Once | ORAL | Status: AC
  Administered 2018-01-05: 40 meq via ORAL
  Filled 2018-01-05: qty 2

## 2018-01-05 MED ORDER — CHLORTHALIDONE 25 MG PO TABS
25.0000 mg | ORAL_TABLET | Freq: Every day | ORAL | Status: DC
  Administered 2018-01-05 – 2018-01-10 (×6): 25 mg via ORAL
  Filled 2018-01-05 (×6): qty 1

## 2018-01-05 MED ORDER — MAGNESIUM SULFATE 2 GM/50ML IV SOLN
2.0000 g | Freq: Once | INTRAVENOUS | Status: AC
  Administered 2018-01-05: 2 g via INTRAVENOUS
  Filled 2018-01-05: qty 50

## 2018-01-05 NOTE — Evaluation (Signed)
Physical Therapy Evaluation Patient Details Name: Rebecca Rich MRN: [id number] DOB: Jun 03, 1945 Today's Date: 01/05/2018   History of Present Illness  s/p acute perforated appendicitis with abscess; s/p lap appendectomy.  PMH:  obesity, DM, HTN  Clinical Impression  The patient is very motivated to [employer] Ambulated x 250' with RW and 50' withput. Patient is more SOB when not using RW. Continue to assess need for RW. Pt admitted with above diagnosis. Pt currently with functional limitations due to the deficits listed below (see PT Problem List). Pt will benefit from skilled PT to increase their independence and safety with mobility to allow discharge to the venue listed below.       Follow Up Recommendations Home health PT    Equipment Recommendations  Rolling walker with 5" wheels(possible a rollator?)    Recommendations for Other Services       Precautions / Restrictions Precautions Precautions: Fall Precaution Comments: left JP abd Restrictions Weight Bearing Restrictions: No      Mobility  Bed Mobility               General bed mobility comments: oob  Transfers Overall transfer level: Needs assistance Equipment used: Rolling walker (2 wheeled);None Transfers: Sit to/from Stand Sit to Stand: Min guard         General transfer comment: one LOB when she first stood without AD  Ambulation/Gait Ambulation/Gait assistance: Min guard Gait Distance (Feet): 300 Feet Assistive device: Rolling walker (2 wheeled) Gait Pattern/deviations: Step-through pattern;Shuffle;Decreased stride length Gait velocity: decr   General Gait Details: ambulated x 20' with RW,  then 27' without RW, gait is slower and more lateral shifting than with RW. Then used RW for ramainde rof  ambulation  Financial trader Rankin (Stroke Patients Only)       Balance Overall balance assessment: Needs assistance           Standing  balance-Leahy Scale: (poor to fair)                               Pertinent Vitals/Pain Pain Assessment: Faces Faces Pain Scale: Hurts a little bit Pain Location: abdomen Pain Descriptors / Indicators: Sore Pain Intervention(s): Monitored during session    Home Living Family/patient expects to be discharged to:: Private residence Living Arrangements: Alone   Type of Home: Apartment       Home Layout: One level Home Equipment: None      Prior Function Level of Independence: Independent         Comments: does not use AD     Hand Dominance        Extremity/Trunk Assessment   Upper Extremity Assessment Upper Extremity Assessment: Overall WFL for tasks assessed    Lower Extremity Assessment Lower Extremity Assessment: Generalized weakness    Cervical / Trunk Assessment Cervical / Trunk Assessment: Normal  Communication   Communication: No difficulties  Cognition Arousal/Alertness: Awake/alert Behavior During Therapy: WFL for tasks assessed/performed Overall Cognitive Status: Within Functional Limits for tasks assessed                                        General Comments      Exercises     Assessment/Plan    PT Assessment Patient needs continued PT  services  PT Problem List Decreased strength;Decreased activity tolerance;Decreased mobility       PT Treatment Interventions DME instruction;Gait training;Functional mobility training;Therapeutic activities;Patient/family education    PT Goals (Current goals can be found in the Care Plan section)  Acute Rehab PT Goals Patient Stated Goal: return to independence PT Goal Formulation: With patient Time For Goal Achievement: 01/19/18 Potential to Achieve Goals: Good    Frequency Min 2X/week   Barriers to discharge        Co-evaluation               AM-PAC PT "6 Clicks" Mobility  Outcome Measure Help needed turning from your back to your side while in a flat  bed without using bedrails?: A Lot Help needed moving from lying on your back to sitting on the side of a flat bed without using bedrails?: A Lot Help needed moving to and from a bed to a chair (including a wheelchair)?: A Little Help needed standing up from a chair using your arms (e.g., wheelchair or bedside chair)?: A Little Help needed to walk in hospital room?: A Lot Help needed climbing 3-5 steps with a railing? : A Lot 6 Click Score: 14    End of Session   Activity Tolerance: Patient tolerated treatment well Patient left: in chair;with call bell/phone within reach Nurse Communication: Mobility status PT Visit Diagnosis: Unsteadiness on feet (R26.81)    Time: 1212-1228 PT Time Calculation (min) (ACUTE ONLY): 16 min   Charges:   PT Evaluation $PT Eval Low Complexity: Theresa Pager [phone] Office [phone]  Rebecca Rich 01/05/2018, 1:15 PM

## 2018-01-05 NOTE — Progress Notes (Addendum)
PROGRESS NOTE    Rebecca Rich  OXB:[id number] DOB: 04-16-1945 DOA: 12/30/2017 PCP: Burnis Medin, MD   Brief Narrative:  The patient is 72 year old female with history of morbid obesity, sleep apnea, hypertension, diabetes mellitus type 2 presents to the ER because of 2 days of abdominal pain mostly in the lower quadrants with nausea.  Pain has been worsening with increased on minimal movement.  Had gone to urgent care and was referred to the ER.  In the ER CT of the abdomen and pelvis done shows possible perforated appendicitis and was taken to the OR.  Before taken to the OR patient had a brief run of SVT and was placed on Cardizem infusion.  Following the surgery patient's heart rate is in the normal range sinus.  Medicine was consulted for assistance and medical management she did well.  Medicine will sign off currently and please do not hesitate to call or reconsult if have any further questions or need further assistance.  Assessment & Plan:   Principal Problem:   Acute perforated appendicitis w abscess s/p lap appendectomy 12/30/2017 Active Problems:   Diabetes mellitus type 2 in obese ([place])   HYPERLIPIDEMIA   Morbid obesity with BMI of 50.0-59.9, adult (HCC)   OSA on CPAP   Essential hypertension   Idiopathic cardiomegaly   Hypothyroid   Hx of gout   Mild aortic stenosis   SVT (supraventricular tachycardia) (HCC)  Diabetes Mellitus Type 2  -We will keep patient on sliding scale coverage with resistant scale AC -Closely follow CBGs. -CBG's ranging from 137-189 -Diabetes Education Coordinator Consulted and her sugars are well controlled -Patient has a HbA1c of 7.0 and recommend following up with PCP  Hypertension  -We will keep patient on PRN IV hydralazine and held off antihypertensives due to low normal blood pressure initially. -Given IV Lasix 60 mg Daily for the last 2 days and will repeat today -Likely can be restarted on her home antihypertensive medications now and  started back on Losartan 100 mg po Daily, continue with home amlodipine 5 mg p.o. daily and if necessary can be increased to 10 mg p.o. daily -Resume Chlorthalidone today -Avoid Hypotension  Sleep Apnea  -C/w  CPAP  Sepsis from perforated appendix and developing ileus -Sepsis physiology is improved -Patient is status post laparoscopic appendectomy and drain placement on 12/30/2017 by Dr. Stark Klein -Appreciate general surgery input patient is on Zosyn and clear liquids and general surgery going to a full liquid diet/dysphagia 1 diet but patient was having significant nausea this morning -Continue with nausea control and is improved  -Per general surgery of persistently elevated white count or evidence of ileus or other concerns consider repeating a CT scan of few days; Had more abdominal Distention and likely developed ileus but will defer to Surgery  -Checked KUB 01/03/18 and showed "Gastric and small bowel distension, favored to represent adynamic ileus. Low-grade partial small bowel obstruction could look similar.Possible right upper quadrant free intraperitoneal air, presumably Postoperative." -We will defer to general surgery to repeat a CT of the abdomen; per general surgery will hold on NG tube placement -Continue to Monitor Carefully -Diet Advancement per Surgery -Continue to monitor closely.  Follow lactate.  Ileus -Diet per General Surgery -Ambulate and Mobilize -C/w Antiemetics with IV Zofran and IV Compazine -Bowel Regimen per Surgery and they are providing patient with 10 mg of Bisacodyl Suppositories daily -Foley to be D/C'd  -Improving   Hypothyroidism  -C/w Synthroid 75 mcg p.o. Daily  Brief run of SVT  -Which improved with IV Cardizem.   -Presently in sinus rhythm but had another 15 seconds of Atrial Flutter in the 160's this Afternoon but back in NSR at 86  -Check TSH and was 4.906 -Troponins elevated in the setting of SVT but trending down -Continue monitor and  check free T4 and was 0.73 and T3 is 69 -Replete Electrolytes as below -Recommend repeating TFT's within 4-6 weeks after discharge and have PCP manage  -C/w Telemetry while hospitalized   Elevated Troponin -Suspect Demand ischemia in the Setting of SVT and Perforated Appendicitis as patient has denied any CP -Troponin Trending Down -Cardiology consulted and recommending ECHO -ECHOCardiogram done and showed Normal EF of 60-65% and G1DD with Normal Wall motion and no wall motion abnormalities  Super Morbid Obesity -Estimated body mass index is 56.85 kg/m as calculated from the following:   Height as of this encounter: 5\' 1"  (1.549 m).   Weight as of this encounter: 136.5 kg. -Weight Loss Counseling Given  Poor Urine Output, much improved  -Foley placed for Measurement and Strict I's/O's -Bolused 3 Liters of NS  -IV Maintenance Fluide of NS + 30 mEQ of KCl at 100 mL/hr reduced to 50 mL/hr and stopped  -Given IV Lasix 60 mg Daily for the last 3 days and has done well -Renal U/S Normal -Cr slightly bumped since but improved back to 1.52 is now even better at 1.05 -? Mediated by SVT and Surgery -If Cr worsening will obtain a Renal Consultation  -Continue to Monitor Carefully and exteremly closely -Check UA and Urine Cx ; urinalysis not really remarkable but did show specific gravity 1.32 -Urine culture Showed NGTD -Patient is +6.364 Since admit -Urine Osm was 830, urine potassium 64, urine sodium was less than 10, urine creatinine is 257.4 -Has improved output since her Lasix and will continue to monitor her volume status very carefully; Resume Home Chlorthalidone -General surgery advancing her diet slowly -Stopped IV fluids as she appeared euvolemic and slightly hypervolemic and will defer to general surgery restart  Hypokalemia -Patient's K+ is 3.2 -Replete with po KCl 40 mEQ BID x 2 dose -C/w Daily KCl 10 mEQ QID -Continue to Monitor and Replete as Necessary -Mag Level was  Normal but low normal at 1.7; Will replete with IV Mag Sulfate 2 grams -Repeat CMP and Mag Level in AM    DVT prophylaxis: Enoxaparin per Primary Code Status: FULL CODE Family Communication: Discussed with Friend at bedside Disposition Plan: Per Primary Team; Medicine will sign off. Please consult for any further assistance or questions.   Consultants:   [facility]  Cardiology   Procedures:  Renal U/S  ECHOCARDIOGRAM ------------------------------------------------------------------- Study Conclusions  - Left ventricle: The cavity size was normal. There was moderate   concentric hypertrophy. Systolic function was normal. The   estimated ejection fraction was in the range of 60% to 65%. Wall   motion was normal; there were no regional wall motion   abnormalities. Doppler parameters are consistent with abnormal   left ventricular relaxation (grade 1 diastolic dysfunction).   Doppler parameters are consistent with elevated ventricular   end-diastolic filling pressure. - Aortic valve: Trileaflet; moderately thickened, moderately   calcified leaflets. 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 There was moderate   regurgitation. Mean gradient (S): 10 mm Hg. Peak gradient (S): 19   mm Hg. Valve area (VTI): 2.44 cm^2. Valve area (Vmax): 2.54 cm^2.   Valve area (Vmean): 2.63 cm^2. - Mitral valve: Calcified annulus. Mildly thickened leaflets .  There was no regurgitation. - Right ventricle: The cavity size was mildly dilated. Wall   thickness was normal. Systolic function was mildly reduced. - Tricuspid valve: There was mild regurgitation. - Pulmonary arteries: Systolic pressure was within the normal   range. - Pericardium, extracardiac: There was no pericardial effusion.  Antimicrobials:  Anti-infectives (From admission, onward)   Start     Dose/Rate Route Frequency Ordered Stop   12/31/17 0100  piperacillin-tazobactam (ZOSYN) IVPB 3.375 g  Status:  Discontinued     3.375 g 12.5 mL/hr over 240  Minutes Intravenous Every 8 hours 12/31/17 0055 01/05/18 1025   12/30/17 1045  ceFEPIme (MAXIPIME) 2 g in sodium chloride 0.9 % 100 mL IVPB     2 g 200 mL/hr over 30 Minutes Intravenous  Once 12/30/17 1044 12/30/17 1342   12/30/17 1045  metroNIDAZOLE (FLAGYL) IVPB 500 mg  Status:  Discontinued     500 mg 100 mL/hr over 60 Minutes Intravenous Every 8 hours 12/30/17 1044 12/31/17 0055     Subjective: Seen and Examined this a.m. at bedside and she is doing remarkably well.  No chest pain, lightheadedness or dizziness.  Felt well and states that she is having bowel movements and was ambulatory ambulate.  No nausea or vomiting.  Worked well with therapy.  No other concerns or complaints at this time and feels a lot better today.  Objective: Vitals:   01/04/18 2250 01/05/18 0708 01/05/18 1015 01/05/18 1256  BP: (!) 137/53 (!) 123/44  (!) 149/48  Pulse: 87 85  80  Resp: 18 20  16   Temp: 98.3 F (36.8 C) 98.8 F (37.1 C)  98.5 F (36.9 C)  TempSrc: Oral Oral  Oral  SpO2: 95% 94%  96%  Weight:   (!) 136.5 kg   Height:        Intake/Output Summary (Last 24 hours) at 01/05/2018 1424 Last data filed at 01/05/2018 1332 Gross per 24 hour  Intake 743.66 ml  Output 1465 ml  Net -721.34 ml   Filed Weights   01/03/18 0500 01/04/18 0500 01/05/18 1015  Weight: (!) 141.6 kg (!) 136.7 kg (!) 136.5 kg   Examination: Physical Exam:  Constitutional: Alert, well-developed morbidly obese African-American female currently in no acute distress and appears more comfortable and is improved from yesterday. Eyes: Lids and conjunctive are normal.  Sclera is anicteric ENMT: External ears and nose appear normal.  Grossly normal hearing Neck: Appears supple no JVD Respiratory: Diminished auscultation bilaterally no visible wheezing, rales, crepitations not tachypneic or using any accessory muscles to breathe  Cardiovascular: Regular rate and rhythm.  No appreciable murmurs, rubs, gallops.  Lower extremity  edema is improving Abdomen: Soft, not as tender to palpate.  And is not as taut or distended as yesterday.  Bowel sounds are present.  JP drain in left side GU: Deferred Musculoskeletal: No contractures or cyanosis.  No deformities noted Skin: Skin is warm and dry.  No appreciable rashes or lesions on mental skin evaluation.  Does have a JP drain Neurologic: Cranial nerves II through XII grossly intact no appreciable focal deficit Psychiatric: Normal mood and affect.  Intact judgment site.  Patient is awake, alert and oriented x3  Data Reviewed: I have personally reviewed following labs and imaging studies  CBC: Recent Labs  Lab 12/30/17 1101  01/01/18 0328 01/02/18 0320 01/03/18 0337 01/04/18 0327 01/05/18 0441  WBC 15.1*   < > 11.7* 12.5* 9.9 9.0 7.9  NEUTROABS 14.2*  --   --  11.2* 7.9* 6.8  --   HGB 15.1*   < > 13.2 12.2 12.0 12.0 10.9*  HCT 46.9*   < > 41.9 39.8 38.9 37.6 34.2*  MCV 93.6   < > 95.2 97.1 93.7 94.9 93.4  PLT 147*   < > 126* 141* 172 182 200   < > = values in this interval not displayed.   Basic Metabolic Panel: Recent Labs  Lab 01/01/18 0328 01/01/18 1630 01/02/18 0320 01/03/18 0337 01/04/18 0327 01/05/18 0441  NA 139 140 139 142 139 136  K 3.9 4.2 4.4 3.9 3.7 3.2*  CL 110 108 110 109 104 102  CO2 18* 21* 22 24 25 27   GLUCOSE 190* 160* 183* 155* 187* 185*  BUN 43* 43* 40* 32* 28* 23  CREATININE 1.63* 1.52* 1.28* 1.16* 1.04* 1.05*  CALCIUM 8.2* 8.1* 8.0* 8.4* 8.6* 8.4*  MG 2.3  --  2.3 2.2 2.0 1.7  PHOS  --   --  2.4* 3.0 3.4  --    GFR: Estimated Creatinine Clearance: 61.8 mL/min (A) (by C-G formula based on SCr of 1.05 mg/dL (H)). Liver Function Tests: Recent Labs  Lab 12/30/17 1059 01/02/18 0320 01/03/18 0337 01/04/18 0327  AST 31 12* 15 22  ALT 17 13 15 22   ALKPHOS 70 47 51 51  BILITOT 1.7* 0.7 1.2 1.0  PROT 7.9 6.3* 5.9* 5.8*  ALBUMIN 4.0 2.8* 2.6* 2.6*   Recent Labs  Lab 12/30/17 1059  LIPASE 37   No results for input(s):  AMMONIA in the last 168 hours. Coagulation Profile: No results for input(s): INR, PROTIME in the last 168 hours. Cardiac Enzymes: Recent Labs  Lab 12/31/17 0726 12/31/17 1001 12/31/17 1556 12/31/17 2106  TROPONINI 1.11* 1.09* 0.75* 0.75*   BNP (last 3 results) No results for input(s): PROBNP in the last 8760 hours. HbA1C: No results for input(s): HGBA1C in the last 72 hours. CBG: Recent Labs  Lab 01/04/18 1148 01/04/18 1617 01/04/18 2249 01/05/18 0757 01/05/18 1145  GLUCAP 199* 188* 189* 137* 185*   Lipid Profile: No results for input(s): CHOL, HDL, LDLCALC, TRIG, CHOLHDL, LDLDIRECT in the last 72 hours. Thyroid Function Tests: Recent Labs    01/04/18 0327  FREET4 0.73*   Anemia Panel: No results for input(s): VITAMINB12, FOLATE, FERRITIN, TIBC, IRON, RETICCTPCT in the last 72 hours. Sepsis Labs: Recent Labs  Lab 12/30/17 1106 12/30/17 1305 12/31/17 0726 12/31/17 1001  LATICACIDVEN 3.97* 2.95* 2.8* 2.3*    Recent Results (from the past 240 hour(s))  Urine culture     Status: None   Collection Time: 12/30/17  1:46 AM  Result Value Ref Range Status   Specimen Description   Final    URINE, RANDOM Performed at [facility] [address], [place], [place] [address]    Special Requests   Final    NONE Performed at [facility], [place] [address] [place], [place] [address]    Culture   Final    NO GROWTH Performed at [facility], [place] [address] [place], [place] [address]    Report Status 01/01/2018 FINAL  Final  Blood Culture (routine x 2)     Status: None   Collection Time: 12/30/17 11:01 AM  Result Value Ref Range Status   Specimen Description   Final    RIGHT ANTECUBITAL Performed at [facility] [address], [place], [place] [address]    Special Requests   Final    Blood  Culture adequate volume BOTTLES DRAWN AEROBIC AND ANAEROBIC   Culture   Final    NO GROWTH 5 DAYS Performed  at [facility], [place] [address] [place], [place] [address]    Report Status 01/04/2018 FINAL  Final  Blood Culture (routine x 2)     Status: None   Collection Time: 12/30/17 11:01 AM  Result Value Ref Range Status   Specimen Description   Final    BLOOD LEFT HAND Performed at [facility] [address], [place], [place] [address]    Special Requests   Final    Blood Culture results may not be optimal due to an inadequate volume of blood received in culture bottles BOTTLES DRAWN AEROBIC AND ANAEROBIC   Culture   Final    NO GROWTH 5 DAYS Performed at [facility], [place] [address] [place], [place] [address]    Report Status 01/04/2018 FINAL  Final  MRSA PCR Screening     Status: None   Collection Time: 12/31/17 12:53 AM  Result Value Ref Range Status   MRSA by PCR NEGATIVE NEGATIVE Final    Comment:        The GeneXpert MRSA Assay (FDA approved for NASAL specimens only), is one component of a comprehensive MRSA colonization surveillance program. It is not intended to diagnose MRSA infection nor to guide or monitor treatment for MRSA infections. Performed at [facility], [place] [address], [place], [place] [address]   Culture, Urine     Status: None   Collection Time: 01/01/18  4:15 PM  Result Value Ref Range Status   Specimen Description   Final    URINE, RANDOM Performed at [facility] [address], [place], [place] [address]    Special Requests   Final    NONE Performed at [facility], [place] [address] [place], [place] [address]    Culture   Final    NO GROWTH Performed at [facility], [place] [address] [place], [place] [address]    Report Status 01/02/2018 FINAL  Final    Radiology Studies: No results found. Scheduled Meds: . acetaminophen  1,000 mg Oral TID  . amLODipine  5 mg Oral Daily  . bisacodyl  10 mg Rectal Daily  . enoxaparin (LOVENOX) injection  0.5  mg/kg Subcutaneous Q24H  . insulin aspart  0-20 Units Subcutaneous TID WC  . levothyroxine  75 mcg Oral Q0600  . losartan  100 mg Oral Daily  . mouth rinse  15 mL Mouth Rinse BID  . potassium chloride  10 mEq Oral QID   Continuous Infusions: . methocarbamol (ROBAXIN) IV      LOS: 6 days   Kerney Elbe, DO [facility] PAGER is on AMION  If 7PM-7AM, please contact night-coverage [email or web address] Password [email or web address] 01/05/2018, 2:24 PM

## 2018-01-05 NOTE — Care Management Important Message (Signed)
Important Message  Patient Details  Name: Rebecca Rich MRN: [id number] Date of Birth: 07-13-1945   Medicare Important Message Given:  Yes    Kerin Salen 01/05/2018, 12:11 [place] Message  Patient Details  Name: Rebecca Rich MRN: [id number] Date of Birth: [birth year]   Medicare Important Message Given:  Yes    Kerin Salen 01/05/2018, 12:11 PM

## 2018-01-05 NOTE — Evaluation (Signed)
Occupational Therapy Evaluation Patient Details Name: Rebecca Rich MRN: [id number] DOB: 1945/03/28 Today's Date: 01/05/2018    History of Present Illness 72 year old female admitted with acute perforated appendicitis with abscess; s/p lap appendectomy.  PMH:  obesity, DM, HTN   Clinical Impression   This 72 year female was admitted for the above. At baseline, she is independent with adls. She needs up to mod A with AE at this time and min guard for mobility. Will follow in acute setting with supervision level goals. Pt will return home with intermittent, less than daily, assistance    Follow Up Recommendations  Home health OT    Equipment Recommendations  None recommended by OT    Recommendations for Other Services       Precautions / Restrictions Precautions Precautions: Fall Restrictions Weight Bearing Restrictions: No      Mobility Bed Mobility               General bed mobility comments: oob  Transfers Overall transfer level: Needs assistance Equipment used: Rolling walker (2 wheeled);None Transfers: Sit to/from Stand Sit to Stand: Min guard         General transfer comment: one LOB when she first stood without AD    Balance Overall balance assessment: Needs assistance           Standing balance-Leahy Scale: (poor to fair)                             ADL either performed or assessed with clinical judgement   ADL Overall ADL's : Needs assistance/impaired Eating/Feeding: Independent   Grooming: Set up;Sitting   Upper Body Bathing: Set up;Sitting   Lower Body Bathing: Moderate assistance;Sit to/from stand(min with AE)   Upper Body Dressing : Set up;Sitting   Lower Body Dressing: Maximal assistance;Sit to/from stand(mod with AE)   Toilet Transfer: Min guard;Ambulation;RW(chair)   Toileting- Clothing Manipulation and Hygiene: Moderate assistance;Sit to/from stand         General ADL Comments: educated on AE options. Pt  has a reacher, sock aide, which she's never used and long sponge at home.  She did have one posterior LOB when she first stood up.  Used RW for stability when walking to bathroom.  Pt would benefit from toilet aide also     Vision         Perception     Praxis      Pertinent Vitals/Pain Pain Assessment: Faces Faces Pain Scale: Hurts a little bit Pain Location: abdomen Pain Descriptors / Indicators: Sore Pain Intervention(s): Limited activity within patient's tolerance;Monitored during session;Repositioned     Hand Dominance     Extremity/Trunk Assessment Upper Extremity Assessment Upper Extremity Assessment: Overall WFL for tasks assessed           Communication Communication Communication: No difficulties   Cognition Arousal/Alertness: Awake/alert Behavior During Therapy: WFL for tasks assessed/performed Overall Cognitive Status: Within Functional Limits for tasks assessed                                     General Comments       Exercises     Shoulder Instructions      Home Living Family/patient expects to be discharged to:: Private residence Living Arrangements: Alone                 Bathroom  Shower/Tub: Teacher, early years/pre: Standard                Prior Functioning/Environment Level of Independence: Independent        Comments: does not use AD        OT Problem List: Decreased strength;Decreased activity tolerance;Impaired balance (sitting and/or standing);Decreased knowledge of precautions;Decreased knowledge of use of DME or AE;Pain;energy conservation      OT Treatment/Interventions: Self-care/ADL training;Energy conservation;DME and/or AE instruction;Patient/family education;Balance training;Therapeutic activities    OT Goals(Current goals can be found in the care plan section) Acute Rehab OT Goals Patient Stated Goal: return to independence OT Goal Formulation: With patient Time For Goal  Achievement: 01/19/18 Potential to Achieve Goals: Good ADL Goals Pt Will Transfer to Toilet: with supervision;regular height toilet;ambulating Pt Will Perform Toileting - Clothing Manipulation and hygiene: with supervision;with adaptive equipment;sit to/from stand Pt Will Perform Tub/Shower Transfer: Tub transfer;ambulating;with supervision(with DME as needed) Additional ADL Goal #1: pt will complete LB adls with supervision to gather clothes and then no supervision for tasks, using AE as needed  OT Frequency: Min 2X/week   Barriers to D/C:            Co-evaluation              AM-PAC OT "6 Clicks" Daily Activity     Outcome Measure Help from another person eating meals?: None Help from another person taking care of personal grooming?: A Little Help from another person toileting, which includes using toliet, bedpan, or urinal?: A Lot Help from another person bathing (including washing, rinsing, drying)?: A Lot Help from another person to put on and taking off regular upper body clothing?: A Little Help from another person to put on and taking off regular lower body clothing?: A Lot 6 Click Score: 16   End of Session    Activity Tolerance: Patient tolerated treatment well Patient left: in chair;with call bell/phone within reach;with family/visitor present  OT Visit Diagnosis: Unsteadiness on feet (R26.81);Muscle weakness (generalized) (M62.81)                Time: 5400-8676 OT Time Calculation (min): 24 min Charges:  OT General Charges $OT Visit: 1 Visit OT Evaluation $OT Eval Low Complexity: [place], OTR/L Acute Rehabilitation Services [phone] [facility] pager [phone] office 01/05/2018  [place] 01/05/2018, 11:14 AM

## 2018-01-05 NOTE — Plan of Care (Signed)
Pt ambulated 2X this shift. Tol well.

## 2018-01-05 NOTE — Progress Notes (Signed)
MD notified of 15 seconds of atrial flutter then back to a sinus rhythm in the 80's.  No new orders received.  Potassium and Magnesium already being replaced.

## 2018-01-05 NOTE — Progress Notes (Addendum)
6 Days Post-Op    CC:  Abdominal pain  Subjective: Up walking from BR with PT/OT.  Tolerating full liquids, and having BM's.  Has done well with Medical issues and making great progress. She lives alone.   Objective: Vital signs in last 24 hours: Temp:  [98.2 F (36.8 C)-98.8 F (37.1 C)] 98.8 F (37.1 C) (12/10 0708) Pulse Rate:  [85-90] 85 (12/10 0708) Resp:  [15-20] 20 (12/10 0708) BP: (123-153)/(44-116) 123/44 (12/10 0708) SpO2:  [94 %-96 %] 94 % (12/10 0708) Last BM Date: 01/03/18 1262 IV Urine 2050 Drain 70  Stool x 3 Afebrile, BP up some x 1,  K+ 3.2 - being replaced Prealbumin 11 WBC is normal   Intake/Output from previous day: 12/09 0701 - 12/10 0700 In: 1262.2 [P.O.:1080; IV Piggyback:182.2] Out: 2120 [Urine:2050; Drains:70] Intake/Output this shift: Total I/O In: 360 [P.O.:360] Out: 180 [Urine:150; Drains:30]  General appearance: alert, cooperative, no distress and up walking, appreciative of the OT assist/advice Resp: clear to auscultation bilaterally GI: soft, sore sites OK, drain is serosanguinous.    Lab Results:  Recent Labs    01/04/18 0327 01/05/18 0441  WBC 9.0 7.9  HGB 12.0 10.9*  HCT 37.6 34.2*  PLT 182 200    BMET Recent Labs    01/04/18 0327 01/05/18 0441  NA 139 136  K 3.7 3.2*  CL 104 102  CO2 25 27  GLUCOSE 187* 185*  BUN 28* 23  CREATININE 1.04* 1.05*  CALCIUM 8.6* 8.4*   PT/INR No results for input(s): LABPROT, INR in the last 72 hours.  Recent Labs  Lab 12/30/17 1059 01/02/18 0320 01/03/18 0337 01/04/18 0327  AST 31 12* 15 22  ALT 17 13 15 22   ALKPHOS 70 47 51 51  BILITOT 1.7* 0.7 1.2 1.0  PROT 7.9 6.3* 5.9* 5.8*  ALBUMIN 4.0 2.8* 2.6* 2.6*     Lipase     Component Value Date/Time   LIPASE 37 12/30/2017 1059     Medications: . acetaminophen  1,000 mg Oral TID  . amLODipine  5 mg Oral Daily  . bisacodyl  10 mg Rectal Daily  . enoxaparin (LOVENOX) injection  0.5 mg/kg Subcutaneous Q24H  .  gabapentin  300 mg Oral BID  . insulin aspart  0-20 Units Subcutaneous TID WC  . levothyroxine  75 mcg Oral Q0600  . losartan  100 mg Oral Daily  . mouth rinse  15 mL Mouth Rinse BID  . potassium chloride  10 mEq Oral QID   Anti-infectives (From admission, onward)   Start     Dose/Rate Route Frequency Ordered Stop   12/31/17 0100  piperacillin-tazobactam (ZOSYN) IVPB 3.375 g     3.375 g 12.5 mL/hr over 240 Minutes Intravenous Every 8 hours 12/31/17 0055     12/30/17 1045  ceFEPIme (MAXIPIME) 2 g in sodium chloride 0.9 % 100 mL IVPB     2 g 200 mL/hr over 30 Minutes Intravenous  Once 12/30/17 1044 12/30/17 1342   12/30/17 1045  metroNIDAZOLE (FLAGYL) IVPB 500 mg  Status:  Discontinued     500 mg 100 mL/hr over 60 Minutes Intravenous Every 8 hours 12/30/17 1044 12/31/17 0055      Assessment/Plan  Hypertension - controlled AKI - new 1.04 (08/04/17) 1.58 >>1.49>> 1.63 >> 1.04  Type 2 diabetes Sleep apnea on CPAP Hypothyroid - synthroid replacement Morbid obesity 56.94 Hypokalemia - replaced Fluid overload - resolved   Sepsis Acute appendicitis perforation/localized abscess Post op ileus Laparoscopic appendectomy, drain  placement, 12/30/2017 Dr. Stark Klein. POD#6 Postop SVT placed on Cardizem/postiive Troponin1.11>>1.09 >> 0.75 >> 0.75 Hx of Mild AR/AS, grade II diastolic dysfunction, EF 30% 12/2014  FEN: IV fluids/full liquids - she had fulls this AM and is taking more; will advance diet  ID: Maxipime/Flagyl x1; 12/4>>Zosyn 12/5=>>day 5 completed, will DC today DVT: Lovenox Follow-up: [facility]   Plan:  Advance diet, keep her walking, stop Zosyn and if doing well home tomorrow.  Appreciate Dr. Alfredia Ferguson assist and getting her back on preop medicines.  I have personally reviewed the patients medication history on the [place] controlled substance database. I have ask Case management to help with setting up home OT/PT. Note from PT still pending.    LOS: 6 days     Baltazar Pekala 01/05/2018 [phone]

## 2018-01-05 NOTE — Plan of Care (Signed)
Patient tolerating carb mod diet without issue, Walked in hallway x 3 on 7 a to 7 p shift, last time without walker.  Patient denies pain.  BM x 3 this shift.

## 2018-01-06 ENCOUNTER — Other Ambulatory Visit: Payer: Self-pay | Admitting: *Deleted

## 2018-01-06 LAB — BASIC METABOLIC PANEL
Anion gap: 7 (ref 5–15)
BUN: 15 mg/dL (ref 8–23)
CHLORIDE: 106 mmol/L (ref 98–111)
CO2: 25 mmol/L (ref 22–32)
Calcium: 8.5 mg/dL — ABNORMAL LOW (ref 8.9–10.3)
Creatinine, Ser: 0.83 mg/dL (ref 0.44–1.00)
GFR calc Af Amer: >60 mL/min (ref >60)
GFR calc non Af Amer: >60 mL/min (ref >60)
Glucose, Bld: 161 mg/dL — ABNORMAL HIGH (ref 70–99)
Potassium: 4.3 mmol/L (ref 3.5–5.1)
Sodium: 138 mmol/L (ref 135–145)

## 2018-01-06 LAB — CBC
HCT: 34 % — ABNORMAL LOW (ref 36.0–46.0)
Hemoglobin: 10.7 g/dL — ABNORMAL LOW (ref 12.0–15.0)
MCH: 29.4 pg (ref 26.0–34.0)
MCHC: 31.5 g/dL (ref 30.0–36.0)
MCV: 93.4 fL (ref 80.0–100.0)
PLATELETS: 236 10*3/uL (ref 150–400)
RBC: 3.64 MIL/uL — ABNORMAL LOW (ref 3.87–5.11)
RDW: 13.9 % (ref 11.5–15.5)
WBC: 8.1 10*3/uL (ref 4.0–10.5)
nRBC: 0 % (ref 0.0–0.2)

## 2018-01-06 LAB — GLUCOSE, CAPILLARY
Glucose-Capillary: 156 mg/dL — ABNORMAL HIGH (ref 70–99)
Glucose-Capillary: 153 mg/dL — ABNORMAL HIGH (ref 70–99)
Glucose-Capillary: 167 mg/dL — ABNORMAL HIGH (ref 70–99)
Glucose-Capillary: 139 mg/dL — ABNORMAL HIGH (ref 70–99)

## 2018-01-06 LAB — CREATININE, SERUM
Creatinine, Ser: 0.82 mg/dL (ref 0.44–1.00)
GFR calc Af Amer: >60 mL/min (ref >60)
GFR calc non Af Amer: >60 mL/min (ref >60)

## 2018-01-06 LAB — MAGNESIUM: Magnesium: 2 mg/dL (ref 1.7–2.4)

## 2018-01-06 MED ORDER — BENZOIN EX LIQD
Freq: Once | CUTANEOUS | Status: DC
  Filled 2018-01-06: qty 120

## 2018-01-06 MED ORDER — METOPROLOL SUCCINATE ER 25 MG PO TB24
25.0000 mg | ORAL_TABLET | Freq: Every day | ORAL | Status: DC
  Administered 2018-01-06 – 2018-01-10 (×5): 25 mg via ORAL
  Filled 2018-01-06 (×5): qty 1

## 2018-01-06 NOTE — Progress Notes (Addendum)
7 Days Post-Op    CC: abdominal pain  Subjective: Pt up some yesterday, doing OK with PO, a little unsteady walking yesterday.  PT recommends Rolling walker, Rollator, home PT.  Pt unaware of irregular HR yesterday.  On CPAP, currently.  Port sites OK, and drain is serous fluid.    Objective: Vital signs in last 24 hours: Temp:  [98.5 F (36.9 C)-99.5 F (37.5 C)] 98.5 F (36.9 C) (12/11 0610) Pulse Rate:  [80-83] 81 (12/11 0610) Resp:  [16-24] 20 (12/11 0610) BP: (138-149)/(43-51) 138/51 (12/11 0610) SpO2:  [96 %-97 %] 96 % (12/11 0610) Weight:  [136.1 kg-136.5 kg] 136.1 kg (12/11 0610) Last BM Date: 01/05/18 840 PO Urine 1600 Drain 95 Stool x 5 TM 99.5 Labs pending 2 runs of AF yesterday, short, but seen on telem. Glucose up some yesterday on soft diet.  Intake/Output from previous day: 12/10 0701 - 12/11 0700 In: 840 [P.O.:840] Out: 5188 [Urine:1600; Drains:95] Intake/Output this shift: No intake/output data recorded.  General appearance: alert, cooperative, no distress and I woke her up she is on her CPAP Resp: clear to auscultation bilaterally GI: Port sites OK, tolerating diet, drain is clear serous fluid.    Lab Results:  Recent Labs    01/04/18 0327 01/05/18 0441  WBC 9.0 7.9  HGB 12.0 10.9*  HCT 37.6 34.2*  PLT 182 200    BMET Recent Labs    01/04/18 0327 01/05/18 0441 01/06/18 0515  NA 139 136  --   K 3.7 3.2*  --   CL 104 102  --   CO2 25 27  --   GLUCOSE 187* 185*  --   BUN 28* 23  --   CREATININE 1.04* 1.05* 0.82  CALCIUM 8.6* 8.4*  --    PT/INR No results for input(s): LABPROT, INR in the last 72 hours.  Recent Labs  Lab 12/30/17 1059 01/02/18 0320 01/03/18 0337 01/04/18 0327  AST 31 12* 15 22  ALT 17 13 15 22   ALKPHOS 70 47 51 51  BILITOT 1.7* 0.7 1.2 1.0  PROT 7.9 6.3* 5.9* 5.8*  ALBUMIN 4.0 2.8* 2.6* 2.6*     Lipase     Component Value Date/Time   LIPASE 37 12/30/2017 1059     Medications: . acetaminophen   1,000 mg Oral TID  . amLODipine  5 mg Oral Daily  . bisacodyl  10 mg Rectal Daily  . chlorthalidone  25 mg Oral Daily  . enoxaparin (LOVENOX) injection  0.5 mg/kg Subcutaneous Q24H  . insulin aspart  0-20 Units Subcutaneous TID WC  . levothyroxine  75 mcg Oral Q0600  . losartan  100 mg Oral Daily  . mouth rinse  15 mL Mouth Rinse BID  . potassium chloride  10 mEq Oral QID   . methocarbamol (ROBAXIN) IV      Assessment/Plan Hypertension - controlled AKI - resolved  Type 2 diabetes Sleep apnea on CPAP Hypothyroid- synthroid replacement Morbid obesity56.94 Hypokalemia - replaced Fluid overload - resolved   Sepsis Acute appendicitis perforation/localized abscess Post op ileus Laparoscopic appendectomy, drain placement, 12/30/2017 Dr. Stark Klein. POD#6 Postop SVT placed on Cardizem/postiive Troponin1.11>>1.09 >> 0.75 >> 0.75 Hx of MildAS/Moderate AR, grade I diastolic dysfunction, EF 41-66% 2 short runs AF 01/05/18 on telem.   FEN: IV fluids/soft diet ID: Maxipime/Flagyl x1; 12/4>>Zosyn 12/5=>>day5 completed,  DVT: Lovenox Follow-up: [facility]    Plan:  I think from the appendicitis aspect she is OK to go.  I have called Medicine  back to look at the recurrent AF.  She is not on anything to treat AF.  She is not on an anticoagulant. I will wait and see what their recommendations are.  I am getting her labs done stat so we have that information, AM CBC not done.  If OK we will pull the drain later and see about going home.  She would like another day because she is still a little unsteady walking solo.  He sister is going to spend some time with her.    I have talked to Medicine and they are recommending we talk with Cardiology see again.   Dr. Stanford Breed added Toprol 25 and no further recommendations.    LOS: 7 days    Carmelle Bamberg 01/06/2018 [phone]

## 2018-01-06 NOTE — Progress Notes (Signed)
Progress Note  Patient Name: Rebecca Rich Date of Encounter: 01/06/2018  Primary Cardiologist: Dr Harrington Challenger  Subjective   No CP, dyspnea or palpitations  Inpatient Medications    Scheduled Meds: . acetaminophen  1,000 mg Oral TID  . amLODipine  5 mg Oral Daily  . bisacodyl  10 mg Rectal Daily  . chlorthalidone  25 mg Oral Daily  . enoxaparin (LOVENOX) injection  0.5 mg/kg Subcutaneous Q24H  . insulin aspart  0-20 Units Subcutaneous TID WC  . levothyroxine  75 mcg Oral Q0600  . losartan  100 mg Oral Daily  . mouth rinse  15 mL Mouth Rinse BID  . potassium chloride  10 mEq Oral QID   Continuous Infusions: . methocarbamol (ROBAXIN) IV     PRN Meds: alum & mag hydroxide-simeth, diphenhydrAMINE **OR** diphenhydrAMINE, guaiFENesin-dextromethorphan, hydrALAZINE, hydrocortisone, hydrocortisone cream, HYDROmorphone (DILAUDID) injection, lip balm, magic mouthwash, menthol-cetylpyridinium, methocarbamol (ROBAXIN) IV, methocarbamol, ondansetron (ZOFRAN) IV, oxyCODONE, phenol, prochlorperazine **OR** prochlorperazine, simethicone, traMADol   Vital Signs    Vitals:   01/05/18 1015 01/05/18 1256 01/05/18 2146 01/06/18 0610  BP:  (!) 149/48 (!) 145/43 (!) 138/51  Pulse:  80 83 81  Resp:  16 (!) 24 20  Temp:  98.5 F (36.9 C) 99.5 F (37.5 C) 98.5 F (36.9 C)  TempSrc:  Oral Oral Oral  SpO2:  96% 97% 96%  Weight: (!) 136.5 kg   136.1 kg  Height:        Intake/Output Summary (Last 24 hours) at 01/06/2018 1222 Last data filed at 01/06/2018 1029 Gross per 24 hour  Intake 480 ml  Output 1345 ml  Net -865 ml   Filed Weights   01/04/18 0500 01/05/18 1015 01/06/18 0610  Weight: (!) 136.7 kg (!) 136.5 kg 136.1 kg    Telemetry    Sinus - Personally Reviewed  Physical Exam   GEN: No acute distress.  Obese Neck: No JVD Cardiac: RRR, no murmurs, rubs, or gallops.  Respiratory: Clear to auscultation bilaterally. GI: Soft, nontender, non-distended, s/p Abd surgery MS: No  edema Neuro:  Nonfocal  Psych: Normal affect   Labs    Chemistry Recent Labs  Lab 01/02/18 0320 01/03/18 0337 01/04/18 0327 01/05/18 0441 01/06/18 0515 01/06/18 0935  NA 139 142 139 136  --  138  K 4.4 3.9 3.7 3.2*  --  4.3  CL 110 109 104 102  --  106  CO2 22 24 25 27   --  25  GLUCOSE 183* 155* 187* 185*  --  161*  BUN 40* 32* 28* 23  --  15  CREATININE 1.28* 1.16* 1.04* 1.05* 0.82 0.83  CALCIUM 8.0* 8.4* 8.6* 8.4*  --  8.5*  PROT 6.3* 5.9* 5.8*  --   --   --   ALBUMIN 2.8* 2.6* 2.6*  --   --   --   AST 12* 15 22  --   --   --   ALT 13 15 22   --   --   --   ALKPHOS 47 51 51  --   --   --   BILITOT 0.7 1.2 1.0  --   --   --   GFRNONAA 41* 46* 53* 52* >60 >60  GFRAA 48* 54* >60 >60 >60 >60  ANIONGAP 7 9 10 7   --  7     Hematology Recent Labs  Lab 01/04/18 0327 01/05/18 0441 01/06/18 0935  WBC 9.0 7.9 8.1  RBC 3.96 3.66* 3.64*  HGB  12.0 10.9* 10.7*  HCT 37.6 34.2* 34.0*  MCV 94.9 93.4 93.4  MCH 30.3 29.8 29.4  MCHC 31.9 31.9 31.5  RDW 13.9 13.8 13.9  PLT 182 200 236    Cardiac Enzymes Recent Labs  Lab 12/31/17 0726 12/31/17 1001 12/31/17 1556 12/31/17 2106  TROPONINI 1.11* 1.09* 0.75* 0.75*    Patient Profile     72 y.o. female admitted with appendicitis.  Noted to have an episode of supraventricular tachycardia but has had no recurrences.  Also with elevated troponin.  Assessment & Plan    1 supraventricular tachycardia-I can find no evidence of recurrence on telemetry.  I also see no strips showing atrial fibrillation.  We will add low-dose Toprol 25 mg daily.  2 elevated troponin-this is felt to be demand ischemia in the setting of supraventricular tachycardia.  Patient denies chest pain.  Once she recovers from her recent surgery we will likely arrange nuclear study following discharge.  3 mild aortic stenosis/moderate aortic insufficiency-she will need follow-up echoes in the future.  4 hypertension-continue present medications.  5 status  post appendectomy/postop ileus-Per general surgery.  [facility] will sign off.   Medication Recommendations: Continue present medications but I am also adding Toprol 25 mg daily. Other recommendations (labs, testing, etc): No other testing from a cardiac standpoint prior to discharge. Follow up as an outpatient: Dr. Harrington Challenger in 4 to 6 weeks.  She will likely arrange nuclear study at that time. For questions or updates, please contact [place] Please consult [email or web address] for contact info under        Signed, Kirk Ruths, MD  01/06/2018, 12:22 PM

## 2018-01-06 NOTE — Discharge Summary (Signed)
Physician Discharge Summary  Patient ID: CHI WOODHAM MRN: [id number] DOB/AGE: [birth year] 72 y.o.  Admit date: 12/30/2017 Discharge date: 01/10/2018  Admission Diagnoses:  Acute appendicitis with probable perforation Tachycardia DM- insulin tx OSA HTN- home meds + IV as needed for excessively high BP OSA - CPAP Mild AS  Discharge Diagnoses:  Sepsis Acute appendicitis perforation/localized abscess Post op ileus Postop SVT placed on Cardizem/postiive Troponin1.11>>1.09 >>0.75 >>0.75 Hx of MildAS/Moderate AR, grade I diastolic dysfunction, EF 66-06% 2 short runs AF 01/05/18 on telem.  C diff colitis - treated 10 day PO vancomycin Hypokalemia - replaced Fluid overload - resolved Hypertension- controlled AKI - resolved  Type 2 diabetes - diet controlled Sleep apnea on CPAP Hypothyroid Morbid obesity56.94    Laparoscopic appendectomy, drain placement, 12/30/2017 Dr. Stark Klein.POD#7  Principal Problem:   Acute perforated appendicitis w abscess s/p lap appendectomy 12/30/2017 Active Problems:   Diabetes mellitus type 2 in obese ([place])   HYPERLIPIDEMIA   Morbid obesity with BMI of 50.0-59.9, adult (HCC)   OSA on CPAP   Essential hypertension   Idiopathic cardiomegaly   Hypothyroid   Hx of gout   Mild aortic stenosis   SVT (supraventricular tachycardia) (HCC)   PROCEDURES: Laparoscopic appendectomy, drain placement, 12/30/2017 Dr. Sallye Lat Course:   Pt is a 72 yo F who presented to the ED with around 1-2 days of abdominal pain.  The pain woke her from sleep Monday AM.  It has gotten a little better since then but has been persistent and severe.  Moving makes the pain worse. She hasn't tried anything for the pain.  She also had fevers and hyperglycemia.  She has been passing gas.  She denies bloating.  She hasn't had BM since this started.  Her appetite has been decreased today.   She was seen at [facility] clinic earlier and came to the ED for  continued pain.  She has had nausea but no vomiting.  She has never had pain like this before. She was found to have tachyarrthymias in the ED and required IV diltiazem.  She was seen by DR. Byerly in the ED and admitted and taken to the OR.  In the OR she was found to have a perforated appendix with localized abscess. The small intestines were stuck in the pelvis.  Immediately visible was purulent drainage and fibrinous rind on the small intestine.  This was slowly and carefully dissected away from the RLQ.  Sharp and blunt dissection was used where appropriate.  The patient was found to have an enlarged and inflamed appendix that was stuck behind the small intestine.  The appendix was friable and came apart with retraction.  The multiple pieces of the appendix as well as the appendicolith were removed from the abdomen with an Endocatch bag through the umbilical port.  There was no evidence of bleeding, leakage, or complication after division of the appendix.  She was transferred to the SDU post op.  She remained stable hemodynamically, she had a new rise in creatinine, and troponin's were elevated.   She was seen by Medicine and cardiology.  She had no further SVT noted in the SDU.  She was slowly mobilized.  Troponin elevation was attributed to Stress from the SVT.   She had a post op ileus that slowly improved and ultimately resolved without issue.  Her diet was advanced and she is up to a regular carb modified diet.   For her diabetes we are going to have her  follow up with her PCP, and continue Diet control as an out patient. Hypertension is controlled back on home medicines. Toprol 25 was added for rate control on 01/06/18 by Dr. Stanford Breed.  She had some fluid overload that has resolved with diuresis.  CKD has resolved even with diuresis.  She will continue CPAP at home for her sleep apnea. 01/07/18 she was having multiple loose stool, not eating because she was afraid to have another loose BM.  C  diff PCR was positive for both the antigen and toxin.  She was started on PO Vancomycin per protocol and will get 10 days of Oral vancomycin.  Her diarrhea improved after a couple of days and she felt more comfortable going home on 01/10/18.  She was otherwise stable.     Disposition: Home   Allergies as of 01/08/2018   No Known Allergies     Medication List    TAKE these medications   accu-chek soft touch lancets Use to test blood glucose twice daily   acetaminophen 500 MG tablet Commonly known as:  TYLENOL Take 2 tablets (1,000 mg total) by mouth every 8 (eight) hours as needed.   amLODipine 5 MG tablet Commonly known as:  NORVASC TAKE 1 TABLET BY MOUTH EVERY DAY   CENTRUM SILVER PO Take 1 tablet by mouth daily.   chlorthalidone 25 MG tablet Commonly known as:  HYGROTON TAKE 1 TABLET BY MOUTH EVERY DAY   cholecalciferol 1000 units tablet Commonly known as:  VITAMIN D Take 1,000 Units by mouth daily.   glucose blood test strip Commonly known as:  ACCU-CHEK AVIVA PLUS Use to check blood sugar daily E11.9   levothyroxine 75 MCG tablet Commonly known as:  SYNTHROID, LEVOTHROID TAKE 1 TABLET (75 MCG TOTAL) BY MOUTH DAILY BEFORE BREAKFAST.   losartan 100 MG tablet Commonly known as:  COZAAR Take 1 tablet (100 mg total) by mouth daily. May begin at 50 mg per day and increase to 100 mg What changed:  additional instructions   metoprolol succinate 25 MG 24 hr tablet Commonly known as:  TOPROL-XL Take 1 tablet (25 mg total) by mouth daily. Call cardiology for refills, you should see them before you need a refill   OSTEO BI-FLEX ADV JOINT SHIELD PO Take 1 tablet by mouth daily.   potassium chloride 10 MEQ tablet Commonly known as:  K-DUR TAKE 1 TABLET (10 MEQ TOTAL) BY MOUTH 3 (THREE) TIMES DAILY.   saccharomyces boulardii 250 MG capsule Commonly known as:  FLORASTOR You can buy this or any other probiotic over the counter at any drug store.  I would take it for  at least the next month.  Follow package directions.   traMADol 50 MG tablet Commonly known as:  ULTRAM Take 1 tablet (50 mg total) by mouth every 6 (six) hours as needed (pain not relieved by Tylenol).   vancomycin 50 mg/mL  oral solution Commonly known as:  VANCOCIN Take 2.5 mLs (125 mg total) by mouth 4 (four) times daily.            Durable Medical Equipment  (From admission, onward)         Start     Ordered   01/06/18 0936  For home use only DME Walker rolling  Once    Comments:  5 inch wheels, rollator if possible To help patient transfer and ambulate.  Physical / Occupational Therapy may change type of walker PRN. [facility] [phone]  01/06/2018  9:36 AM  Question:  Patient needs a walker to treat with the following condition  Answer:  Balance problems   01/06/18 0936         Follow-up Information    [place], [place], PA. Go on 02/22/2018.   Specialty:  Cardiology Why:  @10 :00am for hospital cardiology follow up with Dr. Alan Ripper PA Contact information: [place] [address] [phone]        [facility], [place] Follow up on 01/21/2018.   Specialty:  General Surgery Why:  Your appointment is at 10 AM.  Be at the office 30 minute early for check in.  Bring photo ID and insurance information. Contact information: [address] [place] [address] [phone]        [facility], Standley Brooking, MD Follow up.   Specialties:  Internal Medicine, Pediatrics Why:  CAll for follow up in 1-2 weeks and let her review your medical issues.  Let them know you had surgery.  Review your diabetes and be sure you are well controlled. Contact information: [place] [address] [phone]           Signed: Earnstine Regal 01/08/2018, 10:21 AM  \

## 2018-01-06 NOTE — Progress Notes (Signed)
Occupational Therapy Treatment Patient Details Name: Rebecca Rich MRN: [id number] DOB: [birth year] Today's Date: 01/06/2018    History of present illness s/p acute perforated appendicitis with abscess; s/p lap appendectomy.  PMH:  obesity, DM, HTN   OT comments  Educated on and used tub bench and wide sock aide. Pt plans to get both.   Follow Up Recommendations  Home health OT    Equipment Recommendations  (tub bench:  pt will get)    Recommendations for Other Services      Precautions / Restrictions Precautions Precautions: Fall Precaution Comments: left JP abd Restrictions Weight Bearing Restrictions: No       Mobility Bed Mobility                  Transfers       Sit to Stand: Supervision              Balance                                           ADL either performed or assessed with clinical judgement   ADL                       Lower Body Dressing: Minimal assistance;Sit to/from stand;With adaptive equipment           Tub/ Shower Transfer: Min guard;Tub transfer;Tub bench     General ADL Comments: practiced with wide sock aide with min A. She would be more successful with cornstarch to decrease friction. Practiced stepping over tub sideways:  unsafe as she barely clears ledge. Practiced tub bench twice and she plans to get this     Vision       Perception     Praxis      Cognition Arousal/Alertness: Awake/alert Behavior During Therapy: WFL for tasks assessed/performed Overall Cognitive Status: Within Functional Limits for tasks assessed                                          Exercises     Shoulder Instructions       General Comments      Pertinent Vitals/ Pain       Pain Assessment: Faces Faces Pain Scale: Hurts a little bit Pain Location: abdomen Pain Descriptors / Indicators: Sore Pain Intervention(s): Limited activity within patient's tolerance;Monitored during  session;Repositioned  Home Living                                          Prior Functioning/Environment              Frequency           Progress Toward Goals  OT Goals(current goals can now be found in the care plan section)  Progress towards OT goals: Progressing toward goals(defer further OT to HHOT)     Plan      Co-evaluation                 AM-PAC OT "6 Clicks" Daily Activity     Outcome Measure   Help from another person eating meals?: None Help from another person taking care of personal grooming?:  A Little Help from another person toileting, which includes using toliet, bedpan, or urinal?: A Lot Help from another person bathing (including washing, rinsing, drying)?: A Little Help from another person to put on and taking off regular upper body clothing?: A Little Help from another person to put on and taking off regular lower body clothing?: A Little 6 Click Score: 18    End of Session        Activity Tolerance Patient tolerated treatment well   Patient Left in chair;with call bell/phone within reach;with family/visitor present   Nurse Communication          Time: 1537-1610 OT Time Calculation (min): 33 min  Charges: OT General Charges $OT Visit: 1 Visit OT Treatments $Self Care/Home Management : 23-37 mins  Rebecca Rich, OTR/L Acute Rehabilitation Services [phone] [facility] pager [phone] office 01/06/2018   Rebecca Rich 01/06/2018, 4:15 PM

## 2018-01-07 LAB — C DIFFICILE QUICK SCREEN W PCR REFLEX
C Diff antigen: POSITIVE — AB
C Diff interpretation: DETECTED
C Diff toxin: POSITIVE — AB

## 2018-01-07 LAB — GLUCOSE, CAPILLARY
GLUCOSE-CAPILLARY: 157 mg/dL — AB (ref 70–99)
Glucose-Capillary: 142 mg/dL — ABNORMAL HIGH (ref 70–99)
Glucose-Capillary: 125 mg/dL — ABNORMAL HIGH (ref 70–99)
Glucose-Capillary: 136 mg/dL — ABNORMAL HIGH (ref 70–99)

## 2018-01-07 MED ORDER — ACETAMINOPHEN 500 MG PO TABS: 1000.0000 mg | ORAL_TABLET | Freq: Three times a day (TID) | ORAL | 0 refills | Status: DC | PRN

## 2018-01-07 MED ORDER — VANCOMYCIN 50 MG/ML ORAL SOLUTION
125.0000 mg | Freq: Four times a day (QID) | ORAL | Status: DC
  Administered 2018-01-07 – 2018-01-10 (×13): 125 mg via ORAL
  Filled 2018-01-07 (×14): qty 2.5
  Filled 2018-01-07: qty 70
  Filled 2018-01-07 (×2): qty 2.5

## 2018-01-07 MED ORDER — SACCHAROMYCES BOULARDII 250 MG PO CAPS
250.0000 mg | ORAL_CAPSULE | Freq: Two times a day (BID) | ORAL | Status: DC
  Administered 2018-01-07 – 2018-01-10 (×7): 250 mg via ORAL
  Filled 2018-01-07 (×7): qty 1

## 2018-01-07 MED ORDER — METRONIDAZOLE IN NACL 5-0.79 MG/ML-% IV SOLN: 500.0000 mg | Freq: Three times a day (TID) | INTRAVENOUS | Status: DC

## 2018-01-07 MED ORDER — LOSARTAN POTASSIUM 100 MG PO TABS: 100.0000 mg | ORAL_TABLET | Freq: Every day | ORAL | Status: DC

## 2018-01-07 MED ORDER — METOPROLOL SUCCINATE ER 25 MG PO TB24: 25.0000 mg | ORAL_TABLET | Freq: Every day | ORAL | 0 refills | Status: DC

## 2018-01-07 MED ORDER — VANCOMYCIN 50 MG/ML ORAL SOLUTION: 500.0000 mg | Freq: Four times a day (QID) | ORAL | Status: DC

## 2018-01-07 MED ORDER — TRAMADOL HCL 50 MG PO TABS: 50.0000 mg | ORAL_TABLET | Freq: Four times a day (QID) | ORAL | 0 refills | Status: DC | PRN

## 2018-01-07 NOTE — Progress Notes (Addendum)
8 Days Post-Op    VP:XTGGY appendicitis  Subjective: Had 8 loose BM's yesterday, and she won't eat because she is afraid she will have to go to BR.   No further arrhythmias noted in the Telem review.  I did look yesterday after Dr. Stanford Breed said he could not find strips and they were gone.  Still not available as far as I can tell.   Drainage is serous, and clear. Objective: Vital signs in last 24 hours: Temp:  [98 F (36.7 C)-99.6 F (37.6 C)] 98 F (36.7 C) (12/12 0450) Pulse Rate:  [73-85] 73 (12/12 0851) Resp:  [16-20] 20 (12/12 0450) BP: (103-148)/(46-64) 145/46 (12/12 0851) SpO2:  [97 %-98 %] 98 % (12/12 0450) Weight:  [132.5 kg] 132.5 kg (12/12 0613) Last BM Date: 01/06/18 480 PO 1050 urine Drain 100,  stool x 8 Afebrile, VSS No labs this AM Intake/Output from previous day: 12/11 0701 - 12/12 0700 In: 480 [P.O.:480] Out: 1150 [Urine:1050; Drains:100] Intake/Output this shift: No intake/output data recorded.  General appearance: alert, cooperative and no distress Resp: clear to auscultation bilaterally Cardio: Telem shows SR Abd:  Large, sites OK drain is serosanguinous, mostly serous and clear.  Just having allot of loose stools.  Tolerating what she will eat. Lab Results:  Recent Labs    01/05/18 0441 01/06/18 0935  WBC 7.9 8.1  HGB 10.9* 10.7*  HCT 34.2* 34.0*  PLT 200 236    BMET Recent Labs    01/05/18 0441 01/06/18 0515 01/06/18 0935  NA 136  --  138  K 3.2*  --  4.3  CL 102  --  106  CO2 27  --  25  GLUCOSE 185*  --  161*  BUN 23  --  15  CREATININE 1.05* 0.82 0.83  CALCIUM 8.4*  --  8.5*   PT/INR No results for input(s): LABPROT, INR in the last 72 hours.  Recent Labs  Lab 01/02/18 0320 01/03/18 0337 01/04/18 0327  AST 12* 15 22  ALT 13 15 22   ALKPHOS 47 51 51  BILITOT 0.7 1.2 1.0  PROT 6.3* 5.9* 5.8*  ALBUMIN 2.8* 2.6* 2.6*     Lipase     Component Value Date/Time   LIPASE 37 12/30/2017 1059     Medications: .  acetaminophen  1,000 mg Oral TID  . amLODipine  5 mg Oral Daily  . benzoin tincture   Topical Once  . bisacodyl  10 mg Rectal Daily  . chlorthalidone  25 mg Oral Daily  . enoxaparin (LOVENOX) injection  0.5 mg/kg Subcutaneous Q24H  . insulin aspart  0-20 Units Subcutaneous TID WC  . levothyroxine  75 mcg Oral Q0600  . losartan  100 mg Oral Daily  . mouth rinse  15 mL Mouth Rinse BID  . metoprolol succinate  25 mg Oral Daily  . potassium chloride  10 mEq Oral QID    Assessment/Plan Hypertension- controlled AKI - resolved  Type 2 diabetes Sleep apnea on CPAP Hypothyroid- synthroid replacement Morbid obesity56.94 Hypokalemia - replaced Fluid overload - resolved Diarrhea x 8 01/06/18:    C difficile positive for antigen and toxin     Sepsis Acute appendicitis perforation/localized abscess Post op ileus Laparoscopic appendectomy, drain placement, 12/30/2017 Dr. Stark Klein.POD#6 Postop SVT placed on Cardizem/postiive Troponin1.11>>1.09 >>0.75 >>0.75 Hx of MildAS/Moderate AR, grade I diastolic dysfunction, EF 69-48% 2 short runs AF 01/05/18 on telem.   FEN: IV fluids/soft diet ID: Maxipime/Flagyl x1;12/4>>Zosyn 12/5=>>day5 completed,  DVT: Lovenox Follow-up:[facility] clinic  Plan:  Check C diff, add probiotic, if she is negative for C diff home later today.  I removed drain and steri stripped it discussed care with patient and family present.   Phone report from nursing shows pt is positive for C diff/ results: Positive antigen and toxin. I will start rx with po vancomycin per protocol.     LOS: 8 days    Jasa Dundon 01/07/2018 [phone]

## 2018-01-08 LAB — GLUCOSE, CAPILLARY
Glucose-Capillary: 122 mg/dL — ABNORMAL HIGH (ref 70–99)
Glucose-Capillary: 122 mg/dL — ABNORMAL HIGH (ref 70–99)
Glucose-Capillary: 161 mg/dL — ABNORMAL HIGH (ref 70–99)
Glucose-Capillary: 129 mg/dL — ABNORMAL HIGH (ref 70–99)
Glucose-Capillary: 105 mg/dL — ABNORMAL HIGH (ref 70–99)

## 2018-01-08 LAB — CBC
HCT: 34.2 % — ABNORMAL LOW (ref 36.0–46.0)
Hemoglobin: 10.6 g/dL — ABNORMAL LOW (ref 12.0–15.0)
MCH: 29.3 pg (ref 26.0–34.0)
MCHC: 31 g/dL (ref 30.0–36.0)
MCV: 94.5 fL (ref 80.0–100.0)
Platelets: 250 10*3/uL (ref 150–400)
RBC: 3.62 MIL/uL — ABNORMAL LOW (ref 3.87–5.11)
RDW: 13.8 % (ref 11.5–15.5)
WBC: 7.8 10*3/uL (ref 4.0–10.5)
nRBC: 0 % (ref 0.0–0.2)

## 2018-01-08 MED ORDER — ENOXAPARIN SODIUM 60 MG/0.6ML ~~LOC~~ SOLN
60.0000 mg | SUBCUTANEOUS | Status: DC
  Administered 2018-01-09 – 2018-01-10 (×2): 60 mg via SUBCUTANEOUS
  Filled 2018-01-08 (×2): qty 0.6

## 2018-01-08 MED ORDER — SACCHAROMYCES BOULARDII 250 MG PO CAPS: ORAL_CAPSULE | ORAL | Status: DC

## 2018-01-08 MED ORDER — VANCOMYCIN 50 MG/ML ORAL SOLUTION: 125.0000 mg | Freq: Four times a day (QID) | ORAL | 0 refills | Status: DC

## 2018-01-08 NOTE — Progress Notes (Signed)
9 Days Post-Op    PY:PPJKD appendicitis  Subjective:  She is in BR again, stomach aching this AM. She is reporting fewer stools.  She just feels bad this AM.  She is trying to eat some breakfast now.    Objective: Vital signs in last 24 hours: Temp:  [99 F (37.2 C)-99.2 F (37.3 C)] 99.2 F (37.3 C) (12/13 0520) Pulse Rate:  [76-87] 87 (12/13 0520) Resp:  [18] 18 (12/13 0520) BP: (131-151)/(54-56) 151/54 (12/13 0520) SpO2:  [98 %] 98 % (12/13 0520) Weight:  [130.8 kg] 130.8 kg (12/13 0520) Last BM Date: 01/07/18 240 PO No urine recorded  Down to 2 stools recorded Afebrile, VSS C. difficile antigen/C. difficile toxin positive Afebrile vital signs are stable   Intake/Output from previous day: 12/12 0701 - 12/13 0700 In: 240 [P.O.:240] Out: 0  Intake/Output this shift: No intake/output data recorded.  General appearance: alert, cooperative and no distress Resp: clear to auscultation bilaterally GI: soft sore, complains of some abdominal pain.  Loose stools are much less frequent, she says some of her pills were in the stool yesterday.  Stool has a little more substance today.    Lab Results:  Recent Labs    01/06/18 0935  WBC 8.1  HGB 10.7*  HCT 34.0*  PLT 236    BMET Recent Labs    01/06/18 0515 01/06/18 0935  NA  --  138  K  --  4.3  CL  --  106  CO2  --  25  GLUCOSE  --  161*  BUN  --  15  CREATININE 0.82 0.83  CALCIUM  --  8.5*   PT/INR No results for input(s): LABPROT, INR in the last 72 hours.  Recent Labs  Lab 01/02/18 0320 01/03/18 0337 01/04/18 0327  AST 12* 15 22  ALT 13 15 22   ALKPHOS 47 51 51  BILITOT 0.7 1.2 1.0  PROT 6.3* 5.9* 5.8*  ALBUMIN 2.8* 2.6* 2.6*     Lipase     Component Value Date/Time   LIPASE 37 12/30/2017 1059     Medications: . acetaminophen  1,000 mg Oral TID  . amLODipine  5 mg Oral Daily  . benzoin tincture   Topical Once  . chlorthalidone  25 mg Oral Daily  . enoxaparin (LOVENOX) injection  0.5  mg/kg Subcutaneous Q24H  . insulin aspart  0-20 Units Subcutaneous TID WC  . levothyroxine  75 mcg Oral Q0600  . losartan  100 mg Oral Daily  . mouth rinse  15 mL Mouth Rinse BID  . metoprolol succinate  25 mg Oral Daily  . potassium chloride  10 mEq Oral QID  . saccharomyces boulardii  250 mg Oral BID  . vancomycin  125 mg Oral QID    Assessment/Plan Hypertension- controlled AKI -resolved Type 2 diabetes Sleep apnea on CPAP Hypothyroid- synthroid replacement Morbid obesity56.94 Hypokalemia - replaced Fluid overload - resolved Diarrhea x 8 01/06/18:    C difficile positive for antigen and toxin     Sepsis Acute appendicitis perforation/localized abscess Post op ileus Laparoscopic appendectomy, drain placement, 12/30/2017 Dr. Stark Klein.POD#9 Postop SVT placed on Cardizem/postiive Troponin1.11>>1.09 >>0.75 >>0.75 Hx of MildAS/Moderate AR, grade I diastolic dysfunction, TO67-12% 2 short runs AF 01/05/18 on telemetry - resolved  FEN: IV fluids/soft diet ID: Maxipime/Flagyl x1;12/4>>Zosyn 12/5=>>day5 completed,  DVT: Lovenox Follow-up:[facility] clinic  Plan:  I think she can go from her appendectomy standpoint.  She is still getting over the C diff.  I will  check on her later today.         LOS: 9 days    Alexander Aument 01/08/2018 [phone]

## 2018-01-08 NOTE — Progress Notes (Signed)
Patient still not feeling great from C diff standpoint.  Reassured when we discussed that her diarrhea will gradually improve as she continues treatment and that it is expected for her to have diarrhea after eating right now.  She would like to continue treatment tonight and plan for DC home tomorrow.  I think this is reasonable.  All of her DC stuff is ready for tomorrow.  Henreitta Cea 1:59 PM 01/08/2018

## 2018-01-08 NOTE — Discharge Instructions (Signed)
[facility] ______[facility] °LAPAROSCOPIC SURGERY: POST OP INSTRUCTIONS °Always review your discharge instruction sheet given to you by the facility where your surgery was performed. °IF YOU HAVE DISABILITY OR FAMILY LEAVE FORMS, YOU MUST BRING THEM TO THE OFFICE FOR PROCESSING.   °DO NOT GIVE THEM TO YOUR DOCTOR. ° °1. A prescription for pain medication may be given to you upon discharge.  Take your pain medication as prescribed, if needed.  If narcotic pain medicine is not needed, then you may take acetaminophen (Tylenol) or ibuprofen (Advil) as needed. °2. Take your usually prescribed medications unless otherwise directed. °3. If you need a refill on your pain medication, please contact your pharmacy.  They will contact our office to request authorization. Prescriptions will not be filled after 5pm or on week-ends. °4. You should follow a light diet the first few days after arrival home, such as soup and crackers, etc.  Be sure to include lots of fluids daily. °5. Most patients will experience some swelling and bruising in the area of the incisions.  Ice packs will help.  Swelling and bruising can take several days to resolve.  °6. It is common to experience some constipation if taking pain medication after surgery.  Increasing fluid intake and taking a stool softener (such as Colace) will usually help or prevent this problem from occurring.  A mild laxative (Milk of Magnesia or Miralax) should be taken according to package instructions if there are no bowel movements after 48 hours. °7. Unless discharge instructions indicate otherwise, you may remove your bandages 24-48 hours after surgery, and you may shower at that time.  You may have steri-strips (small skin tapes) in place directly over the incision.  These strips should be left on the skin for 7-10 days.  If your surgeon used skin glue on the incision, you may shower in 24 hours.  The glue will flake off over the next 2-3 weeks.  Any sutures or  staples will be removed at the office during your follow-up visit. °8. ACTIVITIES:  You may resume regular (light) daily activities beginning the next day--such as daily self-care, walking, climbing stairs--gradually increasing activities as tolerated.  You may have sexual intercourse when it is comfortable.  Refrain from any heavy lifting or straining until approved by your doctor. °a. You may drive when you are no longer taking prescription pain medication, you can comfortably wear a seatbelt, and you can safely maneuver your car and apply brakes. °b. RETURN TO WORK:  __________________________________________________________ °9. You should see your doctor in the office for a follow-up appointment approximately 2-3 weeks after your surgery.  Make sure that you call for this appointment within a day or two after you arrive home to insure a convenient appointment time. °10. OTHER INSTRUCTIONS: __________________________________________________________________________________________________________________________ __________________________________________________________________________________________________________________________ °WHEN TO CALL YOUR DOCTOR: °1. Fever over 101.0 °2. Inability to urinate °3. Continued bleeding from incision. °4. Increased pain, redness, or drainage from the incision. °5. Increasing abdominal pain ° °The clinic staff is available to answer your questions during regular business hours.  Please don’t hesitate to call and ask to speak to one of the nurses for clinical concerns.  If you have a medical emergency, go to the nearest emergency room or call 911.  A surgeon from [facility] is always on call at the hospital. °[address], [address], [place], [place]  [address] ? [address] [address], [place],    [address] °[phone] ? [phone] ? FAX [phone] °Web site:   [email or web address]   Clostridium Difficile Infection Clostridium difficile  (C. difficile or C. diff) infection causes inflammation of the large intestine (colon). This condition can result in damage to the lining of your colon and may lead to another condition called colitis. This infection can be passed from person to person (is contagious). Follow these instructions at home: Eating and drinking  Drink enough fluid to keep your pee (urine) clear or pale yellow.  Avoid drinking: ? Milk. ? Caffeine. ? Alcohol.  Follow exact instructions from your doctor about how to get enough fluid in your body (rehydrate).  Eat small meals often instead of large meals. Medicines  Take your antibiotic medicine as told by your doctor. Do not stop taking the antibiotic even if you start to feel better unless your doctor told you to do that.  Take over-the-counter and prescription medicines only as told by your doctor.  Do not use medicines to help with watery poop (diarrhea). General instructions  Wash your hands fully before you prepare food and after you use the bathroom. Make sure people who live with you also wash their  hands often.  Clean the surfaces that you touch. Use a product that contains chlorine bleach.  Keep all follow-up visits as told by your doctor. This is important. Contact a doctor if:  Your symptoms do not get better with treatment.  Your symptoms get worse with treatment.  Your symptoms go away and then come back.  You have a fever.  You have new symptoms. Get help right away if:  You have more pain or tenderness in your belly (abdomen).  Your poop (stool) is mostly bloody.  Your poop looks dark black and tarry.  You cannot eat or drink without throwing up (vomiting).  You have signs of dehydration, such as: ? Dark pee, very little pee, or no pee. ? Cracked lips. ? Not making tears when you cry. ? Dry mouth. ? Sunken eyes. ? Feeling sleepy. ? Feeling weak. ? Feeling dizzy. This information is not intended to replace advice  given to you by your health care provider. Make sure you discuss any questions you have with your health care provider. Document Released: 11/10/2008 Document Revised: 06/21/2015 Document Reviewed: 07/17/2014 Elsevier Interactive Patient Education  2017 [employer].

## 2018-01-08 NOTE — Care Management Important Message (Signed)
Important Message  Patient Details  Name: Rebecca Rich MRN: [id number] Date of Birth: [birth year]   Medicare Important Message Given:  Yes    Kerin Salen 01/08/2018, 11:45 AMImportant Message  Patient Details  Name: Rebecca Rich MRN: [id number] Date of Birth: 01-Oct-1945   Medicare Important Message Given:  Yes    Kerin Salen 01/08/2018, 11:45 AM

## 2018-01-08 NOTE — Progress Notes (Signed)
Pharmacy: Lovenox  Patient's a 72 y.o F s/p appendectomy currently on lovenox for VTE prophylaxis.  No bleeding documented.  CBC and renal function stable.  Plan: - adjust lovenox dose to 60 mg SQ q24h (~0.5 mg/kg for high BMI) --> dose rounded to the closest lovenox syringe size. - with stable renal function, pharmacy will sign off for lovenox.  Re-consult [place] if need further assistance.  Dia Sitter, PharmD, BCPS 01/08/2018 1:06 PM

## 2018-01-08 NOTE — Care Management Note (Signed)
Case Management Note  Patient Details  Name: Rebecca Rich MRN: [id number] Date of Birth: [birth year]  Subjective/Objective:                    Action/Plan: Pt discharging with [facility].  Referral was given to in house rep.    Expected Discharge Date:  (unknown)               Expected Discharge Plan:  [place]  In-House Referral:     Discharge planning Services  CM Consult  Post Acute Care Choice:    Choice offered to:     DME Arranged:    DME Agency:     HH Arranged:  RN [place] Agency:  [facility]  Status of Service:  Completed, signed off  If discussed at [place] of Stay Meetings, dates discussed:    Additional CommentsPurcell Mouton, RN 01/08/2018, 3:21 PM

## 2018-01-08 NOTE — Progress Notes (Signed)
Co pay for oral Vancomycin 125 mg 4x day is $5.00.

## 2018-01-09 LAB — GLUCOSE, CAPILLARY
Glucose-Capillary: 110 mg/dL — ABNORMAL HIGH (ref 70–99)
Glucose-Capillary: 145 mg/dL — ABNORMAL HIGH (ref 70–99)
Glucose-Capillary: 131 mg/dL — ABNORMAL HIGH (ref 70–99)
Glucose-Capillary: 119 mg/dL — ABNORMAL HIGH (ref 70–99)

## 2018-01-09 NOTE — Progress Notes (Signed)
Patient ID: Rebecca Rich, female   DOB: 1945/02/26, 72 y.o.   MRN: [id number] 10 Days Post-Op   Subjective: Diarrhea is main complaint.  She says it is not better and possibly worse about every 2 hours.  No abdominal pain.  Tolerating diet.  Objective: Vital signs in last 24 hours: Temp:  [98.2 F (36.8 C)-99.2 F (37.3 C)] 98.2 F (36.8 C) (12/14 0444) Pulse Rate:  [75-79] 79 (12/14 0444) Resp:  [18-22] 18 (12/14 0444) BP: (129-149)/(47-58) 129/47 (12/14 0444) SpO2:  [97 %-98 %] 97 % (12/14 0444) Weight:  [128.6 kg] 128.6 kg (12/14 0444) Last BM Date: 01/08/18  Intake/Output from previous day: 12/13 0701 - 12/14 0700 In: 240 [P.O.:240] Out: 0  Intake/Output this shift: No intake/output data recorded.  General appearance: alert, cooperative, fatigued and morbidly obese Resp: clear to auscultation bilaterally GI: Obese.  Soft and nontender. Incision/Wound: Small area of erythema periumbilical incision without drainage  Lab Results:  Recent Labs    01/06/18 0935 01/08/18 0906  WBC 8.1 7.8  HGB 10.7* 10.6*  HCT 34.0* 34.2*  PLT 236 250   BMET Recent Labs    01/06/18 0935  NA 138  K 4.3  CL 106  CO2 25  GLUCOSE 161*  BUN 15  CREATININE 0.83  CALCIUM 8.5*     Studies/Results: No results found.  Anti-infectives: Anti-infectives (From admission, onward)   Start     Dose/Rate Route Frequency Ordered Stop   01/08/18 0000  vancomycin (VANCOCIN) 50 mg/mL oral solution     125 mg Oral 4 times daily 01/08/18 0926     01/07/18 1400  vancomycin (VANCOCIN) 50 mg/mL oral solution 125 mg     125 mg Oral 4 times daily 01/07/18 1239 01/17/18 1359   01/07/18 1400  metroNIDAZOLE (FLAGYL) IVPB 500 mg  Status:  Discontinued     500 mg 100 mL/hr over 60 Minutes Intravenous Every 8 hours 01/07/18 1239 01/07/18 1246   01/07/18 1245  vancomycin (VANCOCIN) 50 mg/mL oral solution 500 mg  Status:  Discontinued     500 mg Oral Every 6 hours 01/07/18 1239 01/07/18 1246   12/31/17 0100  piperacillin-tazobactam (ZOSYN) IVPB 3.375 g  Status:  Discontinued     3.375 g 12.5 mL/hr over 240 Minutes Intravenous Every 8 hours 12/31/17 0055 01/05/18 1025   12/30/17 1045  ceFEPIme (MAXIPIME) 2 g in sodium chloride 0.9 % 100 mL IVPB     2 g 200 mL/hr over 30 Minutes Intravenous  Once 12/30/17 1044 12/30/17 1342   12/30/17 1045  metroNIDAZOLE (FLAGYL) IVPB 500 mg  Status:  Discontinued     500 mg 100 mL/hr over 60 Minutes Intravenous Every 8 hours 12/30/17 1044 12/31/17 0055      Assessment/Plan: s/p Procedure(s): APPENDECTOMY LAPAROSCOPIC C. difficile colitis.  On oral vancomycin. Patient does not feel she will be able to manage on her own at home with her degree of diarrhea.  She does seem fatigued.  Not quite ready for discharge.  Continue current treatment.    LOS: 10 days    Edward Jolly 01/09/2018

## 2018-01-10 DIAGNOSIS — Z48815 Encounter for surgical aftercare following surgery on the digestive system: Secondary | ICD-10-CM | POA: Diagnosis not present

## 2018-01-10 DIAGNOSIS — A419 Sepsis, unspecified organism: Secondary | ICD-10-CM | POA: Diagnosis not present

## 2018-01-10 DIAGNOSIS — I119 Hypertensive heart disease without heart failure: Secondary | ICD-10-CM | POA: Diagnosis not present

## 2018-01-10 DIAGNOSIS — E1165 Type 2 diabetes mellitus with hyperglycemia: Secondary | ICD-10-CM | POA: Diagnosis not present

## 2018-01-10 DIAGNOSIS — K3533 Acute appendicitis with perforation and localized peritonitis, with abscess: Secondary | ICD-10-CM | POA: Diagnosis not present

## 2018-01-10 DIAGNOSIS — Z4801 Encounter for change or removal of surgical wound dressing: Secondary | ICD-10-CM | POA: Diagnosis not present

## 2018-01-10 LAB — GLUCOSE, CAPILLARY
Glucose-Capillary: 124 mg/dL — ABNORMAL HIGH (ref 70–99)
Glucose-Capillary: 135 mg/dL — ABNORMAL HIGH (ref 70–99)

## 2018-01-10 NOTE — Progress Notes (Signed)
Per pt, she has RW at home. [facility] notified of scheduled dc home today with HH. Please see previous NCM notes. Jonnie Finner RN CCM Case Mgmt phone [phone]

## 2018-01-10 NOTE — Progress Notes (Signed)
Patient ID: Rebecca Rich, female   DOB: 07-20-1945, 72 y.o.   MRN: [id number] 11 Days Post-Op   Subjective: Feels much better today.  Diarrhea resolved and had 2 formed bowel movements.  Denies abdominal pain.  Tolerating diet.  Anxious to go home.  Objective: Vital signs in last 24 hours: Temp:  [98.7 F (37.1 C)-99.4 F (37.4 C)] 99.1 F (37.3 C) (12/15 0530) Pulse Rate:  [75-80] 79 (12/15 0530) Resp:  [20-24] 20 (12/15 0530) BP: (134-148)/(44-48) 136/44 (12/15 0530) SpO2:  [98 %-99 %] 98 % (12/15 0530) Last BM Date: 01/09/18  Intake/Output from previous day: No intake/output data recorded. Intake/Output this shift: No intake/output data recorded.  General appearance: alert, cooperative and no distress GI: normal findings: soft, non-tender and Obese Incision/Wound: Small amount of erythema near central trocar site without drainage, stable  Lab Results:  Recent Labs    01/08/18 0906  WBC 7.8  HGB 10.6*  HCT 34.2*  PLT 250   BMET No results for input(s): NA, K, CL, CO2, GLUCOSE, BUN, CREATININE, CALCIUM in the last 72 hours.   Studies/Results: No results found.  Anti-infectives: Anti-infectives (From admission, onward)   Start     Dose/Rate Route Frequency Ordered Stop   01/08/18 0000  vancomycin (VANCOCIN) 50 mg/mL oral solution     125 mg Oral 4 times daily 01/08/18 0926     01/07/18 1400  vancomycin (VANCOCIN) 50 mg/mL oral solution 125 mg     125 mg Oral 4 times daily 01/07/18 1239 01/17/18 1359   01/07/18 1400  metroNIDAZOLE (FLAGYL) IVPB 500 mg  Status:  Discontinued     500 mg 100 mL/hr over 60 Minutes Intravenous Every 8 hours 01/07/18 1239 01/07/18 1246   01/07/18 1245  vancomycin (VANCOCIN) 50 mg/mL oral solution 500 mg  Status:  Discontinued     500 mg Oral Every 6 hours 01/07/18 1239 01/07/18 1246   12/31/17 0100  piperacillin-tazobactam (ZOSYN) IVPB 3.375 g  Status:  Discontinued     3.375 g 12.5 mL/hr over 240 Minutes Intravenous Every 8 hours  12/31/17 0055 01/05/18 1025   12/30/17 1045  ceFEPIme (MAXIPIME) 2 g in sodium chloride 0.9 % 100 mL IVPB     2 g 200 mL/hr over 30 Minutes Intravenous  Once 12/30/17 1044 12/30/17 1342   12/30/17 1045  metroNIDAZOLE (FLAGYL) IVPB 500 mg  Status:  Discontinued     500 mg 100 mL/hr over 60 Minutes Intravenous Every 8 hours 12/30/17 1044 12/31/17 0055      Assessment/Plan: Perforated appendicitis s/p Procedure(s): APPENDECTOMY LAPAROSCOPIC C. difficile colitis on vancomycin clinically resolving Doing well.  Okay for discharge today.  To finish course of oral vancomycin at home.    LOS: 11 days    Edward Jolly 01/10/2018

## 2018-01-11 ENCOUNTER — Telehealth: Payer: Self-pay | Admitting: Internal Medicine

## 2018-01-11 ENCOUNTER — Other Ambulatory Visit: Payer: Self-pay | Admitting: *Deleted

## 2018-01-11 DIAGNOSIS — E1165 Type 2 diabetes mellitus with hyperglycemia: Secondary | ICD-10-CM | POA: Diagnosis not present

## 2018-01-11 DIAGNOSIS — I119 Hypertensive heart disease without heart failure: Secondary | ICD-10-CM | POA: Diagnosis not present

## 2018-01-11 DIAGNOSIS — A419 Sepsis, unspecified organism: Secondary | ICD-10-CM | POA: Diagnosis not present

## 2018-01-11 DIAGNOSIS — Z48815 Encounter for surgical aftercare following surgery on the digestive system: Secondary | ICD-10-CM | POA: Diagnosis not present

## 2018-01-11 DIAGNOSIS — Z4801 Encounter for change or removal of surgical wound dressing: Secondary | ICD-10-CM | POA: Diagnosis not present

## 2018-01-11 DIAGNOSIS — K3533 Acute appendicitis with perforation and localized peritonitis, with abscess: Secondary | ICD-10-CM | POA: Diagnosis not present

## 2018-01-11 NOTE — Patient Outreach (Signed)
Confirmed with Tommi Rumps with [facility]  that Ms. Alvizo enrolled in the [place] program.   Telephone call made to Ms. Guevarra to make aware that [facility] Management could potentially assist with transportation to MD appointments if needed. Ms. Dobkins denies any transportation needs. States her sister takes her to appointments. Appreciative of the call however.  Notification to sent to [place] Management office to make aware of [facility] enrollment.   Marthenia Rolling, MSN-Ed, RN,BSN [facility] Liaison [phone]

## 2018-01-11 NOTE — Anesthesia Postprocedure Evaluation (Signed)
Anesthesia Post Note  Patient: Rebecca Rich  Procedure(s) Performed: APPENDECTOMY LAPAROSCOPIC (N/A )     Patient location during evaluation: PACU Anesthesia Type: General Level of consciousness: sedated Pain management: pain level controlled Vital Signs Assessment: post-procedure vital signs reviewed and stable Respiratory status: spontaneous breathing Cardiovascular status: stable Postop Assessment: no apparent nausea or vomiting Anesthetic complications: no    Last Vitals:  Vitals:   01/10/18 0530 01/10/18 1246  BP: (!) 136/44 (!) 135/46  Pulse: 79 78  Resp: 20 18  Temp: 37.3 C 36.7 C  SpO2: 98% 98%    Last Pain:  Vitals:   01/10/18 1246  TempSrc: Oral  PainSc:    Pain Goal: Patients Stated Pain Goal: 0 (01/04/18 0800)               Huston Foley

## 2018-01-11 NOTE — Telephone Encounter (Signed)
Copied from [place] [id number]. Topic: Quick Communication - Home Health Verbal Orders >> Jan 11, 2018  1:21 PM Vernona Rieger wrote: Caller/Agency: Sree, physical therapy with [facility] Number: [phone] Requesting OT/PT/Skilled Nursing/Social Work: physical therapy Frequency: 2 week 2, 1 week 3

## 2018-01-12 DIAGNOSIS — Z48815 Encounter for surgical aftercare following surgery on the digestive system: Secondary | ICD-10-CM | POA: Diagnosis not present

## 2018-01-12 DIAGNOSIS — A419 Sepsis, unspecified organism: Secondary | ICD-10-CM | POA: Diagnosis not present

## 2018-01-12 DIAGNOSIS — I119 Hypertensive heart disease without heart failure: Secondary | ICD-10-CM | POA: Diagnosis not present

## 2018-01-12 DIAGNOSIS — K3533 Acute appendicitis with perforation and localized peritonitis, with abscess: Secondary | ICD-10-CM | POA: Diagnosis not present

## 2018-01-12 DIAGNOSIS — Z4801 Encounter for change or removal of surgical wound dressing: Secondary | ICD-10-CM | POA: Diagnosis not present

## 2018-01-12 DIAGNOSIS — E1165 Type 2 diabetes mellitus with hyperglycemia: Secondary | ICD-10-CM | POA: Diagnosis not present

## 2018-01-12 NOTE — Telephone Encounter (Signed)
Please advise 

## 2018-01-13 ENCOUNTER — Telehealth: Payer: Self-pay | Admitting: Internal Medicine

## 2018-01-13 NOTE — Telephone Encounter (Signed)
Please advise Dr Panosh, thanks.   

## 2018-01-13 NOTE — Telephone Encounter (Signed)
Copied from [place] [phone]. Topic: Quick Communication - Home Health Verbal Orders >> Jan 13, 2018  8:14 AM Alanda Slim E wrote: Caller/Agency: [facility] Callback Number: [phone] (secure to leave a voicemail) Requesting OT for ADL transfers, IADL and exercise  Frequency: 1x a week for 3 weeks

## 2018-01-14 DIAGNOSIS — A419 Sepsis, unspecified organism: Secondary | ICD-10-CM | POA: Diagnosis not present

## 2018-01-14 DIAGNOSIS — I119 Hypertensive heart disease without heart failure: Secondary | ICD-10-CM | POA: Diagnosis not present

## 2018-01-14 DIAGNOSIS — K3533 Acute appendicitis with perforation and localized peritonitis, with abscess: Secondary | ICD-10-CM | POA: Diagnosis not present

## 2018-01-14 DIAGNOSIS — Z4801 Encounter for change or removal of surgical wound dressing: Secondary | ICD-10-CM | POA: Diagnosis not present

## 2018-01-14 DIAGNOSIS — E1165 Type 2 diabetes mellitus with hyperglycemia: Secondary | ICD-10-CM | POA: Diagnosis not present

## 2018-01-14 DIAGNOSIS — Z48815 Encounter for surgical aftercare following surgery on the digestive system: Secondary | ICD-10-CM | POA: Diagnosis not present

## 2018-01-15 DIAGNOSIS — I119 Hypertensive heart disease without heart failure: Secondary | ICD-10-CM | POA: Diagnosis not present

## 2018-01-15 DIAGNOSIS — Z48815 Encounter for surgical aftercare following surgery on the digestive system: Secondary | ICD-10-CM | POA: Diagnosis not present

## 2018-01-15 DIAGNOSIS — E1165 Type 2 diabetes mellitus with hyperglycemia: Secondary | ICD-10-CM | POA: Diagnosis not present

## 2018-01-15 DIAGNOSIS — A419 Sepsis, unspecified organism: Secondary | ICD-10-CM | POA: Diagnosis not present

## 2018-01-15 DIAGNOSIS — Z4801 Encounter for change or removal of surgical wound dressing: Secondary | ICD-10-CM | POA: Diagnosis not present

## 2018-01-15 DIAGNOSIS — K3533 Acute appendicitis with perforation and localized peritonitis, with abscess: Secondary | ICD-10-CM | POA: Diagnosis not present

## 2018-01-15 NOTE — Telephone Encounter (Signed)
See my previous answer .  She has post hospital check anc should be able to proceed with these orders  Confirm at her OV

## 2018-01-15 NOTE — Telephone Encounter (Signed)
She has  Post hospital visit next week and can  Approve  For this post hospital at that time

## 2018-01-15 NOTE — Telephone Encounter (Signed)
Left a detailed message that all orders will be determined at [facility] next week.   Nothing further needed.

## 2018-01-15 NOTE — Telephone Encounter (Signed)
Panosh, Standley Brooking, MD 7 hours ago (8:20 AM)     She has  Post hospital visit next week and can  Approve  For this post hospital at that time     Left a detailed message that all orders will be determined at [facility] next week.   Nothing further needed.

## 2018-01-18 DIAGNOSIS — I119 Hypertensive heart disease without heart failure: Secondary | ICD-10-CM | POA: Diagnosis not present

## 2018-01-18 DIAGNOSIS — A419 Sepsis, unspecified organism: Secondary | ICD-10-CM | POA: Diagnosis not present

## 2018-01-18 DIAGNOSIS — K3533 Acute appendicitis with perforation and localized peritonitis, with abscess: Secondary | ICD-10-CM | POA: Diagnosis not present

## 2018-01-18 DIAGNOSIS — Z4801 Encounter for change or removal of surgical wound dressing: Secondary | ICD-10-CM | POA: Diagnosis not present

## 2018-01-18 DIAGNOSIS — E1165 Type 2 diabetes mellitus with hyperglycemia: Secondary | ICD-10-CM | POA: Diagnosis not present

## 2018-01-18 DIAGNOSIS — Z48815 Encounter for surgical aftercare following surgery on the digestive system: Secondary | ICD-10-CM | POA: Diagnosis not present

## 2018-01-19 ENCOUNTER — Other Ambulatory Visit: Payer: Self-pay | Admitting: Internal Medicine

## 2018-01-19 DIAGNOSIS — A419 Sepsis, unspecified organism: Secondary | ICD-10-CM | POA: Diagnosis not present

## 2018-01-19 DIAGNOSIS — E1165 Type 2 diabetes mellitus with hyperglycemia: Secondary | ICD-10-CM | POA: Diagnosis not present

## 2018-01-19 DIAGNOSIS — Z48815 Encounter for surgical aftercare following surgery on the digestive system: Secondary | ICD-10-CM | POA: Diagnosis not present

## 2018-01-19 DIAGNOSIS — K3533 Acute appendicitis with perforation and localized peritonitis, with abscess: Secondary | ICD-10-CM | POA: Diagnosis not present

## 2018-01-19 DIAGNOSIS — I119 Hypertensive heart disease without heart failure: Secondary | ICD-10-CM | POA: Diagnosis not present

## 2018-01-19 DIAGNOSIS — Z4801 Encounter for change or removal of surgical wound dressing: Secondary | ICD-10-CM | POA: Diagnosis not present

## 2018-01-21 DIAGNOSIS — K3533 Acute appendicitis with perforation and localized peritonitis, with abscess: Secondary | ICD-10-CM | POA: Diagnosis not present

## 2018-01-21 DIAGNOSIS — E1165 Type 2 diabetes mellitus with hyperglycemia: Secondary | ICD-10-CM | POA: Diagnosis not present

## 2018-01-21 DIAGNOSIS — Z48815 Encounter for surgical aftercare following surgery on the digestive system: Secondary | ICD-10-CM | POA: Diagnosis not present

## 2018-01-21 DIAGNOSIS — I119 Hypertensive heart disease without heart failure: Secondary | ICD-10-CM | POA: Diagnosis not present

## 2018-01-21 DIAGNOSIS — Z4801 Encounter for change or removal of surgical wound dressing: Secondary | ICD-10-CM | POA: Diagnosis not present

## 2018-01-21 DIAGNOSIS — A419 Sepsis, unspecified organism: Secondary | ICD-10-CM | POA: Diagnosis not present

## 2018-01-22 ENCOUNTER — Encounter: Payer: Self-pay | Admitting: Internal Medicine

## 2018-01-22 ENCOUNTER — Ambulatory Visit (INDEPENDENT_AMBULATORY_CARE_PROVIDER_SITE_OTHER): Payer: Medicare Other | Admitting: Internal Medicine

## 2018-01-22 VITALS — BP 108/54 | HR 79 | Temp 98.3°F | Wt 269.1 lb

## 2018-01-22 DIAGNOSIS — Z9049 Acquired absence of other specified parts of digestive tract: Secondary | ICD-10-CM | POA: Diagnosis not present

## 2018-01-22 DIAGNOSIS — E039 Hypothyroidism, unspecified: Secondary | ICD-10-CM | POA: Diagnosis not present

## 2018-01-22 DIAGNOSIS — E669 Obesity, unspecified: Secondary | ICD-10-CM

## 2018-01-22 DIAGNOSIS — I350 Nonrheumatic aortic (valve) stenosis: Secondary | ICD-10-CM | POA: Diagnosis not present

## 2018-01-22 DIAGNOSIS — E1169 Type 2 diabetes mellitus with other specified complication: Secondary | ICD-10-CM

## 2018-01-22 DIAGNOSIS — I471 Supraventricular tachycardia: Secondary | ICD-10-CM | POA: Diagnosis not present

## 2018-01-22 DIAGNOSIS — I10 Essential (primary) hypertension: Secondary | ICD-10-CM

## 2018-01-22 DIAGNOSIS — D649 Anemia, unspecified: Secondary | ICD-10-CM | POA: Diagnosis not present

## 2018-01-22 DIAGNOSIS — Z9989 Dependence on other enabling machines and devices: Secondary | ICD-10-CM

## 2018-01-22 DIAGNOSIS — Z09 Encounter for follow-up examination after completed treatment for conditions other than malignant neoplasm: Secondary | ICD-10-CM | POA: Diagnosis not present

## 2018-01-22 DIAGNOSIS — A0472 Enterocolitis due to Clostridium difficile, not specified as recurrent: Secondary | ICD-10-CM

## 2018-01-22 DIAGNOSIS — G4733 Obstructive sleep apnea (adult) (pediatric): Secondary | ICD-10-CM

## 2018-01-22 DIAGNOSIS — Z79899 Other long term (current) drug therapy: Secondary | ICD-10-CM | POA: Diagnosis not present

## 2018-01-22 MED ORDER — POTASSIUM CHLORIDE ER 10 MEQ PO TBCR: 10.0000 meq | EXTENDED_RELEASE_TABLET | Freq: Three times a day (TID) | ORAL | 5 refills | Status: DC

## 2018-01-22 NOTE — Progress Notes (Signed)
Chief Complaint  Patient presents with  . Hospitalization Follow-up    Appendectomy 12/30/17 - doing well since d/c. Pt was d/c'd home on 01/10/18    HPI: DARILYN STORBECK 72 y.o. come in for post hosp check for  Perforated appenidix  w sepsis and   With rx   And now   Released from surgery serice.  Here with sis   Eating bid  No sugar and  No fried food.   Stools firm and black  Just  finished  Vancomycin  rx for pos c diff and  rx .  On probiotic   bp has been good no falling.  Needs potassium refilled   No bleeding   Has blister where tape was taken off  No pain at this time covered  By bandage per [place]   Needs new pulm   For osa as prev  Currently NA  To have a stress test in jan  -per Cards  Had svt in hospital and has mild AS   Needs to see her foot doctor  At some point    ROS: See pertinent positives and negatives per HPI. No current cpsob bleeding  New sx  ocass cough since hospital dry  Past Medical History:  Diagnosis Date  . Acute bronchospasm 08/24/2009  . Acute sphenoidal sinusitis 02/26/2010   Qualifier: Diagnosis of  By: Regis Bill MD, Standley Brooking   . CARDIAC MURMUR 12/08/2006  . COLONIC POLYPS, HX OF 12/08/2006  . DIABETES MELLITUS, TYPE II 12/08/2006  . Gallbladder/common duct stone, without infection, with obstruction 03/01/2010   removed ercp  . HYPERLIPIDEMIA 12/08/2006  . HYPERTENSION 12/08/2006  . 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 INFECTION, SKIN AND SOFT TISSUE 07/28/2008  . 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 LIVER FUNCTION TESTS, ABNORMAL 07/28/2008  . Morbid obesity ([place]) 12/08/2006  . OBESITY 09/24/2009  . OBSTRUCTIVE SLEEP APNEA 12/08/2006  . OSTEOARTHRITIS 12/08/2006  . RUQ PAIN 06/16/2008  . SHOULDER PAIN, RIGHT 02/07/2008  . Sleep apnea   . Swelling of limb 07/28/2008  . THYROID FUNCTION TEST, ABNORMAL 12/08/2006    Family History  Problem Relation Age of Onset  . Other Mother        blood clots  . Cervical cancer Mother   . Cancer Mother   . Heart  disease Brother   . Diabetes Brother   . Heart disease Sister   . Diabetes Sister   . Heart disease Brother   . Diabetes Brother   . Heart disease Brother   . Diabetes Brother   . Stroke Sister   . Diabetes Sister   . Throat cancer Father   . Cancer Father   . Diabetes Other        all siblings, 4 brothers, 5 sisters    Social History   Socioeconomic History  . Marital status: Divorced    Spouse name: Not on file  . Number of children: Not on file  . Years of education: Not on file  . Highest education level: Not on file  Occupational History  . Occupation: retired    Fish farm manager: RETIRED  Social Needs  . Financial resource strain: Not on file  . Food insecurity:    Worry: Not on file    Inability: Not on file  . Transportation needs:    Medical: Not on file    Non-medical: Not on file  Tobacco Use  . Smoking status: Never Smoker  . Smokeless tobacco: Never Used  Substance and Sexual Activity  .  Alcohol use: No    Alcohol/week: 0.0 standard drinks  . Drug use: No  . Sexual activity: Not on file  Lifestyle  . Physical activity:    Days per week: Not on file    Minutes per session: Not on file  . Stress: Not on file  Relationships  . Social connections:    Talks on phone: Not on file    Gets together: Not on file    Attends religious service: Not on file    Active member of club or organization: Not on file    Attends meetings of clubs or organizations: Not on file    Relationship status: Not on file  Other Topics Concern  . Not on file  Social History Narrative   Master level education in math   Pt is currently retired   Pt is divorced with children   Recently had to move had a break in and thus away from her pool exercise    Outpatient Medications Prior to Visit  Medication Sig Dispense Refill  . acetaminophen (TYLENOL) 500 MG tablet Take 2 tablets (1,000 mg total) by mouth every 8 (eight) hours as needed. 30 tablet 0  . amLODipine (NORVASC) 5 MG  tablet TAKE 1 TABLET BY MOUTH EVERY DAY (Patient taking differently: Take 5 mg by mouth daily. ) 90 tablet 0  . chlorthalidone (HYGROTON) 25 MG tablet TAKE 1 TABLET BY MOUTH EVERY DAY (Patient taking differently: Take 25 mg by mouth daily. ) 90 tablet 0  . cholecalciferol (VITAMIN D) 1000 UNITS tablet Take 1,000 Units by mouth daily.    Marland Kitchen glucose blood (ACCU-CHEK AVIVA PLUS) test strip Use to check blood sugar daily E11.9 100 each 12  . Lancets (ACCU-CHEK SOFT TOUCH) lancets Use to test blood glucose twice daily 50 each 12  . levothyroxine (SYNTHROID, LEVOTHROID) 75 MCG tablet TAKE 1 TABLET (75 MCG TOTAL) BY MOUTH DAILY BEFORE BREAKFAST. 90 tablet 0  . losartan (COZAAR) 100 MG tablet Take 1 tablet (100 mg total) by mouth daily. May begin at 50 mg per day and increase to 100 mg    . metoprolol succinate (TOPROL-XL) 25 MG 24 hr tablet Take 1 tablet (25 mg total) by mouth daily. Call cardiology for refills, you should see them before you need a refill 30 tablet 0  . Misc Natural Products (OSTEO BI-FLEX ADV JOINT SHIELD PO) Take 1 tablet by mouth daily.    . Multiple Vitamins-Minerals (CENTRUM SILVER PO) Take 1 tablet by mouth daily.      Marland Kitchen saccharomyces boulardii (FLORASTOR) 250 MG capsule You can buy this or any other probiotic over the counter at any drug store.  I would take it for at least the next month.  Follow package directions.    . potassium chloride (K-DUR) 10 MEQ tablet TAKE 1 TABLET (10 MEQ TOTAL) BY MOUTH 3 (THREE) TIMES DAILY. 90 tablet 2  . traMADol (ULTRAM) 50 MG tablet Take 1 tablet (50 mg total) by mouth every 6 (six) hours as needed (pain not relieved by Tylenol). (Patient not taking: Reported on 01/22/2018) 15 tablet 0  . vancomycin (VANCOCIN) 50 mg/mL oral solution Take 2.5 mLs (125 mg total) by mouth 4 (four) times daily. (Patient not taking: Reported on 01/22/2018) 2.5 mL 0   No facility-administered medications prior to visit.      EXAM:  BP (!) 108/54 (BP Location: Left  Wrist, Patient Position: Sitting, Cuff Size: Normal)   Pulse 79   Temp 98.3 F (36.8 C) (  Oral)   Wt 269 lb 1.6 oz (122.1 kg)   BMI 50.85 kg/m   Body mass index is 50.85 kg/m.  GENERAL: vitals reviewed and listed above, alert, oriented, appears well hydrated and in no acute distress HEENT: atraumatic, conjunctiva  clear, no obvious abnormalities on inspection of external nose and ears NECK: no obvious masses on inspection palpation  LUNGS: clear to auscultation bilaterally, no wheezes, rales or rhonchi, good air movement CV: HRRR, no clubbing cyanosis or  peripheral edema nl cap refill  2/6 sem usb MS: moves all extremities without noticeable focal  Abnormality abd skin left lq 1.5 cm falt blister  No redness or pus noted   Dry recovered  PSYCH: pleasant and cooperative, no obvious depression or anxiety Lab Results  Component Value Date   WBC 7.8 01/08/2018   HGB 10.6 (L) 01/08/2018   HCT 34.2 (L) 01/08/2018   PLT 250 01/08/2018   GLUCOSE 161 (H) 01/06/2018   CHOL 124 06/09/2017   TRIG 87.0 06/09/2017   HDL 53.50 06/09/2017   LDLCALC 53 06/09/2017   ALT 22 01/04/2018   AST 22 01/04/2018   NA 138 01/06/2018   K 4.3 01/06/2018   CL 106 01/06/2018   CREATININE 0.83 01/06/2018   BUN 15 01/06/2018   CO2 25 01/06/2018   TSH 4.906 (H) 01/02/2018   HGBA1C 7.0 (H) 12/31/2017   MICROALBUR 0.4 08/02/2009   BP Readings from Last 3 Encounters:  01/22/18 (!) 108/54  01/10/18 (!) 135/46  12/08/17 140/62   Wt Readings from Last 3 Encounters:  01/22/18 269 lb 1.6 oz (122.1 kg)  01/09/18 283 lb 8 oz (128.6 kg)  12/08/17 287 lb 14.4 oz (130.6 kg)    ASSESSMENT AND PLAN:  Discussed the following assessment and plan:  Hospital discharge follow-up  Diabetes mellitus type 2 in obese ([place]) - Plan: TSH, Basic metabolic panel, CBC with Differential/Platelet, Lipid panel, Hemoglobin A1c  Essential hypertension - Plan: TSH, Basic metabolic panel, CBC with Differential/Platelet, Lipid  panel  Medication management - Plan: TSH, Basic metabolic panel, CBC with Differential/Platelet, Lipid panel  Hypothyroidism, unspecified type - Plan: TSH, Basic metabolic panel, CBC with Differential/Platelet, Lipid panel  Anemia, unspecified type - Plan: TSH, Basic metabolic panel, CBC with Differential/Platelet, Lipid panel  Mild aortic stenosis  SVT (supraventricular tachycardia) (HCC)  OSA on CPAP - Plan: Ambulatory referral to Pulmonology  C. difficile enteritis - resolved  post vancomycin rx based on  sx at this time    S/P appendectomy Hx c diff  just finished  Vancomycin   Disc   Avoiding  antibiotic unless  Life saving  Etc benefit more than risk .  Currently seems to be better [facility] to refill the ;potassium as requested.  Plan lab  Before next visit .    Expectant management.   Had  rov in march and will do labs pre visit  [place] skin care   Blister from tape is clean    Cover avoid trauma .  14  # weight loss from illness   Recovering  Counseled. ?s about the c difficile issue  Total visit 70mins > 50% spent counseling and coordinating care as indicated in above note and in instructions to patient .   -Patient advised to return or notify health care team  if  new concerns arise.  Patient Instructions  Get lab  before next visit  ( ordre have been placed ).  Keep  March visit .   Let us know  If diarrhea or fever returns .   Stay on probiotic .   Keep card  Follow up.   Will do referral for pulmonary sleep .   Since  Your previous is not available .     Standley Brooking. Labaron Digirolamo M.D.  Admit date: 12/30/2017 Discharge date: 01/10/2018  Admission Diagnoses:  Acute appendicitis with probable perforation Tachycardia DM- insulin tx OSA HTN- home meds + IV as needed for excessively high BP OSA - CPAP Mild AS  Discharge Diagnoses:  Sepsis Acute appendicitis perforation/localized abscess Post op ileus Postop SVT placed on Cardizem/postiive Troponin1.11>>1.09  >>0.75 >>0.75 Hx of MildAS/Moderate AR, grade I diastolic dysfunction, HD62-22% 2 short runs AF 01/05/18 on telem. C diff colitis - treated 10 day PO vancomycin Hypokalemia - replaced Fluid overload - resolved Hypertension- controlled AKI -resolved Type 2 diabetes - diet controlled Sleep apnea on CPAP Hypothyroid Morbid obesity56.94    Laparoscopic appendectomy, drain placement, 12/30/2017 Dr. Stark Klein.POD#[id number]  Principal Problem:   Acute perforated appendicitis w abscess s/p lap appendectomy 12/30/2017 Active Problems:   Diabetes mellitus type 2 in obese ([employer])   HYPERLIPIDEMIA   Morbid obesity with BMI of 50.0-59.9, adult (HCC)   OSA on CPAP   Essential hypertension   Idiopathic cardiomegaly   Hypothyroid   Hx of gout   Mild aortic stenosis   SVT (supraventricular tachycardia) (HCC)   PROCEDURES: Laparoscopic appendectomy, drain placement, 12/30/2017 Dr. Sallye Lat Course:   Pt is a 72 yo F who presented to the ED with around1-2 daysof abdominal pain. The pain woke her from sleep Monday AM. It has gotten a little better since then but has been persistent and severe. Moving makes the pain worse. She hasn't tried anything for the pain. She also had fevers and hyperglycemia.She has been passing gas. She denies bloating. She hasn't had BM since this started. Her appetite has been decreased today.  She was [facility] at [facility] clinic earlier and came to the ED forcontinuedpain. She has had nausea but no vomiting. She has never had pain like this before. She was found to have tachyarrthymias in the ED and required IV diltiazem. She was seen by DR. Byerly in the ED and admitted and taken to the OR.  In the OR she was found to have a perforated appendix with localized abscess. The small intestines were stuck in the pelvis. Immediately visible was purulent drainage and fibrinous rind on the small intestine. This was slowly and carefully  dissected away from the RLQ. Sharp and blunt dissection was used where appropriate. The patient was found to have an enlarged and inflamed appendix that wasstuck behind the small intestine.  The appendix was friable and came apart with retraction. Themultiple pieces of theappendixas well as the appendicolithwereremoved from the abdomen with an Endocatch bag through the umbilicalport. There was no evidence of bleeding, leakage, or complication after division of the appendix.  She was transferred to the SDU post op.  She remained stable hemodynamically, she had a new rise in creatinine, and troponin's were elevated.   She was seen by Medicine and cardiology.  She had no further SVT noted in the SDU.  She was slowly mobilized.  Troponin elevation was attributed to Stress from the SVT.   She had a post op ileus that slowly improved and ultimately resolved without issue.  Her diet was advanced and she is up to a regular carb modified diet.   For her diabetes we are going to have her follow up  with her PCP, and continue Diet control as an out patient. Hypertension is controlled back on home medicines. Toprol 25 was added for rate control on 01/06/18 by Dr. Stanford Breed.  She had some fluid overload that has resolved with diuresis.  CKD has resolved even with diuresis.  She will continue CPAP at home for her sleep apnea. 01/07/18 she was having multiple loose stool, not eating because she was afraid to have another loose BM.  C diff PCR was positive for both the antigen and toxin.  She was started on PO Vancomycin per protocol and will get 10 days of Oral vancomycin.  Her diarrhea improved after a couple of days and she felt more comfortable going home on 01/10/18.  She was otherwise stable.

## 2018-01-22 NOTE — Patient Instructions (Addendum)
Get lab  before next visit  ( ordre have been placed ).  Keep  March visit .   Let us know  If diarrhea or fever returns .   Stay on probiotic .   Keep card  Follow up.   Will do referral for pulmonary sleep .   Since  Your previous is not available .

## 2018-01-25 DIAGNOSIS — I119 Hypertensive heart disease without heart failure: Secondary | ICD-10-CM | POA: Diagnosis not present

## 2018-01-25 DIAGNOSIS — Z4801 Encounter for change or removal of surgical wound dressing: Secondary | ICD-10-CM | POA: Diagnosis not present

## 2018-01-25 DIAGNOSIS — Z48815 Encounter for surgical aftercare following surgery on the digestive system: Secondary | ICD-10-CM | POA: Diagnosis not present

## 2018-01-25 DIAGNOSIS — K3533 Acute appendicitis with perforation and localized peritonitis, with abscess: Secondary | ICD-10-CM | POA: Diagnosis not present

## 2018-01-25 DIAGNOSIS — A419 Sepsis, unspecified organism: Secondary | ICD-10-CM | POA: Diagnosis not present

## 2018-01-25 DIAGNOSIS — E1165 Type 2 diabetes mellitus with hyperglycemia: Secondary | ICD-10-CM | POA: Diagnosis not present

## 2018-01-26 DIAGNOSIS — K3533 Acute appendicitis with perforation and localized peritonitis, with abscess: Secondary | ICD-10-CM | POA: Diagnosis not present

## 2018-01-26 DIAGNOSIS — Z48815 Encounter for surgical aftercare following surgery on the digestive system: Secondary | ICD-10-CM | POA: Diagnosis not present

## 2018-01-26 DIAGNOSIS — A419 Sepsis, unspecified organism: Secondary | ICD-10-CM | POA: Diagnosis not present

## 2018-01-26 DIAGNOSIS — E1165 Type 2 diabetes mellitus with hyperglycemia: Secondary | ICD-10-CM | POA: Diagnosis not present

## 2018-01-26 DIAGNOSIS — Z4801 Encounter for change or removal of surgical wound dressing: Secondary | ICD-10-CM | POA: Diagnosis not present

## 2018-01-26 DIAGNOSIS — I119 Hypertensive heart disease without heart failure: Secondary | ICD-10-CM | POA: Diagnosis not present

## 2018-02-01 ENCOUNTER — Ambulatory Visit: Payer: Self-pay | Admitting: *Deleted

## 2018-02-01 NOTE — Telephone Encounter (Signed)
Returned call to pt and advised pt that Coricidin was used specifically for people with a history of high blood pressure and is used to treat symptoms of a cold. Pt asked if medication was an antibiotic and pt notified that the medication was not an antibiotic. Pt verbalized understanding. No additional questions at this time.   Reason for Disposition . Caller has medication question, adult has minor symptoms, caller declines triage, and triager answers question  Answer Assessment - Initial Assessment Questions 1. SYMPTOMS: "Do you have any symptoms?"     Cold symptoms  Protocols used: MEDICATION QUESTION CALL-A-AH

## 2018-02-01 NOTE — Telephone Encounter (Signed)
Message from [place] sent at 02/01/2018 9:23 AM EST   Patient is calling to verify if its okay to take coricidin With her having high blood pressure. Please advise    Left message for pt to return call to office.

## 2018-02-09 DIAGNOSIS — K3533 Acute appendicitis with perforation and localized peritonitis, with abscess: Secondary | ICD-10-CM | POA: Diagnosis not present

## 2018-02-09 DIAGNOSIS — A419 Sepsis, unspecified organism: Secondary | ICD-10-CM | POA: Diagnosis not present

## 2018-02-09 DIAGNOSIS — Z48815 Encounter for surgical aftercare following surgery on the digestive system: Secondary | ICD-10-CM | POA: Diagnosis not present

## 2018-02-09 DIAGNOSIS — I119 Hypertensive heart disease without heart failure: Secondary | ICD-10-CM | POA: Diagnosis not present

## 2018-02-09 DIAGNOSIS — E1165 Type 2 diabetes mellitus with hyperglycemia: Secondary | ICD-10-CM | POA: Diagnosis not present

## 2018-02-09 DIAGNOSIS — Z4801 Encounter for change or removal of surgical wound dressing: Secondary | ICD-10-CM | POA: Diagnosis not present

## 2018-02-15 DIAGNOSIS — Z9181 History of falling: Secondary | ICD-10-CM

## 2018-02-15 DIAGNOSIS — Z6841 Body Mass Index (BMI) 40.0 and over, adult: Secondary | ICD-10-CM

## 2018-02-15 DIAGNOSIS — M1990 Unspecified osteoarthritis, unspecified site: Secondary | ICD-10-CM | POA: Diagnosis not present

## 2018-02-15 DIAGNOSIS — R000 Tachycardia, unspecified: Secondary | ICD-10-CM

## 2018-02-15 DIAGNOSIS — E1165 Type 2 diabetes mellitus with hyperglycemia: Secondary | ICD-10-CM | POA: Diagnosis not present

## 2018-02-15 DIAGNOSIS — G4733 Obstructive sleep apnea (adult) (pediatric): Secondary | ICD-10-CM

## 2018-02-15 DIAGNOSIS — A419 Sepsis, unspecified organism: Secondary | ICD-10-CM | POA: Diagnosis not present

## 2018-02-15 DIAGNOSIS — M25511 Pain in right shoulder: Secondary | ICD-10-CM | POA: Diagnosis not present

## 2018-02-15 DIAGNOSIS — Z48815 Encounter for surgical aftercare following surgery on the digestive system: Secondary | ICD-10-CM | POA: Diagnosis not present

## 2018-02-15 DIAGNOSIS — Z9089 Acquired absence of other organs: Secondary | ICD-10-CM

## 2018-02-15 DIAGNOSIS — Z86010 Personal history of colonic polyps: Secondary | ICD-10-CM

## 2018-02-15 DIAGNOSIS — I351 Nonrheumatic aortic (valve) insufficiency: Secondary | ICD-10-CM | POA: Diagnosis not present

## 2018-02-15 DIAGNOSIS — Z4801 Encounter for change or removal of surgical wound dressing: Secondary | ICD-10-CM | POA: Diagnosis not present

## 2018-02-15 DIAGNOSIS — E785 Hyperlipidemia, unspecified: Secondary | ICD-10-CM

## 2018-02-15 DIAGNOSIS — Z9981 Dependence on supplemental oxygen: Secondary | ICD-10-CM

## 2018-02-15 DIAGNOSIS — Z90711 Acquired absence of uterus with remaining cervical stump: Secondary | ICD-10-CM

## 2018-02-15 DIAGNOSIS — I119 Hypertensive heart disease without heart failure: Secondary | ICD-10-CM | POA: Diagnosis not present

## 2018-02-15 DIAGNOSIS — I350 Nonrheumatic aortic (valve) stenosis: Secondary | ICD-10-CM

## 2018-02-15 DIAGNOSIS — Z9049 Acquired absence of other specified parts of digestive tract: Secondary | ICD-10-CM

## 2018-02-15 DIAGNOSIS — I471 Supraventricular tachycardia: Secondary | ICD-10-CM | POA: Diagnosis not present

## 2018-02-15 DIAGNOSIS — E039 Hypothyroidism, unspecified: Secondary | ICD-10-CM | POA: Diagnosis not present

## 2018-02-15 DIAGNOSIS — M109 Gout, unspecified: Secondary | ICD-10-CM | POA: Diagnosis not present

## 2018-02-15 DIAGNOSIS — K3533 Acute appendicitis with perforation and localized peritonitis, with abscess: Secondary | ICD-10-CM | POA: Diagnosis not present

## 2018-02-15 DIAGNOSIS — E876 Hypokalemia: Secondary | ICD-10-CM

## 2018-02-15 DIAGNOSIS — S301XXD Contusion of abdominal wall, subsequent encounter: Secondary | ICD-10-CM

## 2018-02-16 ENCOUNTER — Telehealth: Payer: Self-pay | Admitting: Internal Medicine

## 2018-02-16 NOTE — Telephone Encounter (Signed)
HH Cert and Plan of Care form received from [place].  Placed in Dr Velora Mediate red folder.

## 2018-02-19 NOTE — Telephone Encounter (Signed)
Form was completed yestaerday

## 2018-02-19 NOTE — Telephone Encounter (Signed)
Form placed to be faxed back

## 2018-02-22 ENCOUNTER — Ambulatory Visit: Payer: Medicare Other | Admitting: Physician Assistant

## 2018-02-23 ENCOUNTER — Institutional Professional Consult (permissible substitution): Payer: Medicare Other | Admitting: Pulmonary Disease

## 2018-02-23 NOTE — Progress Notes (Signed)
Chief Complaint  Patient presents with  . Knot on throat    Pt states she has a knot on her throat. Been there for 4 days. Pt states its not painful and has gone down since monday. Pt has not tried anything for it.     HPI: Rebecca Rich 73 y.o. come in for new problem Noticed a few days ago incidentally by touching after washing her face a lump in the left anterior neck.  This was not tender it was bigger the day after and then today is smaller.  There is no pain sore throat fever change in health otherwise and no trauma.  She has not noticed it before She continues to recover from the appendectomy has lost 20 pounds in just over 2 months and her blood sugars are good. No sore throat coughing neck pain. ROS: See pertinent positives and negatives per HPI.  Past Medical History:  Diagnosis Date  . Acute bronchospasm 08/24/2009  . Acute sphenoidal sinusitis 02/26/2010   Qualifier: Diagnosis of  By: Regis Bill MD, Standley Brooking   . CARDIAC MURMUR 12/08/2006  . COLONIC POLYPS, HX OF 12/08/2006  . DIABETES MELLITUS, TYPE II 12/08/2006  . Gallbladder/common duct stone, without infection, with obstruction 03/01/2010   removed ercp  . HYPERLIPIDEMIA 12/08/2006  . HYPERTENSION 12/08/2006  . 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 INFECTION, SKIN AND SOFT TISSUE 07/28/2008  . 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 LIVER FUNCTION TESTS, ABNORMAL 07/28/2008  . Morbid obesity ([place]) 12/08/2006  . OBESITY 09/24/2009  . OBSTRUCTIVE SLEEP APNEA 12/08/2006  . OSTEOARTHRITIS 12/08/2006  . RUQ PAIN 06/16/2008  . SHOULDER PAIN, RIGHT 02/07/2008  . Sleep apnea   . Swelling of limb 07/28/2008  . THYROID FUNCTION TEST, ABNORMAL 12/08/2006    Family History  Problem Relation Age of Onset  . Other Mother        blood clots  . Cervical cancer Mother   . Cancer Mother   . Heart disease Brother   . Diabetes Brother   . Heart disease Sister   . Diabetes Sister   . Heart disease Brother   . Diabetes Brother   . Heart disease Brother     . Diabetes Brother   . Stroke Sister   . Diabetes Sister   . Throat cancer Father   . Cancer Father   . Diabetes Other        all siblings, 4 brothers, 5 sisters    Social History   Socioeconomic History  . Marital status: Divorced    Spouse name: Not on file  . Number of children: Not on file  . Years of education: Not on file  . Highest education level: Not on file  Occupational History  . Occupation: retired    Fish farm manager: RETIRED  Social Needs  . Financial resource strain: Not on file  . Food insecurity:    Worry: Not on file    Inability: Not on file  . Transportation needs:    Medical: Not on file    Non-medical: Not on file  Tobacco Use  . Smoking status: Never Smoker  . Smokeless tobacco: Never Used  Substance and Sexual Activity  . Alcohol use: No    Alcohol/week: 0.0 standard drinks  . Drug use: No  . Sexual activity: Not on file  Lifestyle  . Physical activity:    Days per week: Not on file    Minutes per session: Not on file  . Stress: Not on file  Relationships  .  Social connections:    Talks on phone: Not on file    Gets together: Not on file    Attends religious service: Not on file    Active member of club or organization: Not on file    Attends meetings of clubs or organizations: Not on file    Relationship status: Not on file  Other Topics Concern  . Not on file  Social History Narrative   Master level education in math   Pt is currently retired   Pt is divorced with children   Recently had to move had a break in and thus away from her pool exercise    Outpatient Medications Prior to Visit  Medication Sig Dispense Refill  . acetaminophen (TYLENOL) 500 MG tablet Take 2 tablets (1,000 mg total) by mouth every 8 (eight) hours as needed. 30 tablet 0  . amLODipine (NORVASC) 5 MG tablet TAKE 1 TABLET BY MOUTH EVERY DAY 90 tablet 0  . chlorthalidone (HYGROTON) 25 MG tablet TAKE 1 TABLET BY MOUTH EVERY DAY 90 tablet 0  . cholecalciferol  (VITAMIN D) 1000 UNITS tablet Take 1,000 Units by mouth daily.    Marland Kitchen glucose blood (ACCU-CHEK AVIVA PLUS) test strip Use to check blood sugar daily E11.9 100 each 12  . Lancets (ACCU-CHEK SOFT TOUCH) lancets Use to test blood glucose twice daily 50 each 12  . levothyroxine (SYNTHROID, LEVOTHROID) 75 MCG tablet TAKE 1 TABLET (75 MCG TOTAL) BY MOUTH DAILY BEFORE BREAKFAST. 90 tablet 0  . losartan (COZAAR) 100 MG tablet Take 1 tablet (100 mg total) by mouth daily. May begin at 50 mg per day and increase to 100 mg    . Misc Natural Products (OSTEO BI-FLEX ADV JOINT SHIELD PO) Take 1 tablet by mouth daily.    . Multiple Vitamins-Minerals (CENTRUM SILVER PO) Take 1 tablet by mouth daily.      . potassium chloride (K-DUR) 10 MEQ tablet Take 1 tablet (10 mEq total) by mouth 3 (three) times daily. 90 tablet 5  . saccharomyces boulardii (FLORASTOR) 250 MG capsule You can buy this or any other probiotic over the counter at any drug store.  I would take it for at least the next month.  Follow package directions.    . metoprolol succinate (TOPROL-XL) 25 MG 24 hr tablet Take 1 tablet (25 mg total) by mouth daily. Call cardiology for refills, you should see them before you need a refill (Patient not taking: Reported on 02/24/2018) 30 tablet 0   No facility-administered medications prior to visit.      EXAM:  BP (!) 144/82 (BP Location: Right Arm, Patient Position: Sitting, Cuff Size: Large)   Pulse 85   Temp 98.6 F (37 C) (Oral)   Wt 263 lb 12.8 oz (119.7 kg)   BMI 49.84 kg/m   Body mass index is 49.84 kg/m.  GENERAL: vitals reviewed and listed above, alert, oriented, appears well hydrated and in no acute distress HEENT: atraumatic, conjunctiva  clear, no obvious abnormalities on inspection of external nose and ears e  NECK: no obvious masses on inspection palpation healed scar the area of concern is at the proximal clavicle at the clavicular sternal joint.  There is no pain or mass but possibly  prominence like a bony prominence compared to the right.  There is no scoliosis obviously and no sternal pain Cardiovascular S1-S2 no gallops there is a 2/6 systolic murmur that is nonradiating and also not new.  MS: moves all extremities without noticeable focal  abnormality PSYCH: pleasant and cooperative, no obvious depression or anxiety Lab Results  Component Value Date   WBC 7.8 01/08/2018   HGB 10.6 (L) 01/08/2018   HCT 34.2 (L) 01/08/2018   PLT 250 01/08/2018   GLUCOSE 161 (H) 01/06/2018   CHOL 124 06/09/2017   TRIG 87.0 06/09/2017   HDL 53.50 06/09/2017   LDLCALC 53 06/09/2017   ALT 22 01/04/2018   AST 22 01/04/2018   NA 138 01/06/2018   K 4.3 01/06/2018   CL 106 01/06/2018   CREATININE 0.83 01/06/2018   BUN 15 01/06/2018   CO2 25 01/06/2018   TSH 4.906 (H) 01/02/2018   HGBA1C 7.0 (H) 12/31/2017   MICROALBUR 0.4 08/02/2009   BP Readings from Last 3 Encounters:  02/24/18 (!) 144/82  01/22/18 (!) 108/54  01/10/18 (!) 135/46   Wt Readings from Last 3 Encounters:  02/24/18 263 lb 12.8 oz (119.7 kg)  01/22/18 269 lb 1.6 oz (122.1 kg)  01/09/18 283 lb 8 oz (128.6 kg)     ASSESSMENT AND PLAN:  Discussed the following assessment and plan:  Lump on neck - Plan: DG Clavicle Left, DG Sternum, DG Sternum, DG Clavicle Left  Morbid obesity ([place]) - w complications  dm ht hld This is most likely a bony prominence that is more obvious since she has lost weight.  Discussed with her get plain x-ray film and watch it if progressive or painful can do more imaging. After patient left x-ray is consistent with initial impression reassurance to be given.  Will follow exam.  Encouragement given to continue healthy weight loss for health reasons. -Patient advised to return or notify health care team  if  new concerns arise.  Patient Instructions  I think this is a bony prominence  at end of clavicle .     And may be noted because you have lost weigh tin your neck area.    Usually a benign process . If x ray ok lets observe and  Recheck if changing .       Standley Brooking. Chinwe Lope M.D.

## 2018-02-24 ENCOUNTER — Ambulatory Visit (INDEPENDENT_AMBULATORY_CARE_PROVIDER_SITE_OTHER): Payer: Medicare Other | Admitting: Internal Medicine

## 2018-02-24 ENCOUNTER — Encounter: Payer: Self-pay | Admitting: Internal Medicine

## 2018-02-24 ENCOUNTER — Ambulatory Visit (INDEPENDENT_AMBULATORY_CARE_PROVIDER_SITE_OTHER): Payer: Medicare Other

## 2018-02-24 VITALS — BP 144/82 | HR 85 | Temp 98.6°F | Wt 263.8 lb

## 2018-02-24 DIAGNOSIS — E1165 Type 2 diabetes mellitus with hyperglycemia: Secondary | ICD-10-CM | POA: Diagnosis not present

## 2018-02-24 DIAGNOSIS — I119 Hypertensive heart disease without heart failure: Secondary | ICD-10-CM | POA: Diagnosis not present

## 2018-02-24 DIAGNOSIS — A419 Sepsis, unspecified organism: Secondary | ICD-10-CM | POA: Diagnosis not present

## 2018-02-24 DIAGNOSIS — R221 Localized swelling, mass and lump, neck: Secondary | ICD-10-CM

## 2018-02-24 DIAGNOSIS — Z48815 Encounter for surgical aftercare following surgery on the digestive system: Secondary | ICD-10-CM | POA: Diagnosis not present

## 2018-02-24 DIAGNOSIS — M19012 Primary osteoarthritis, left shoulder: Secondary | ICD-10-CM | POA: Diagnosis not present

## 2018-02-24 DIAGNOSIS — R0789 Other chest pain: Secondary | ICD-10-CM | POA: Diagnosis not present

## 2018-02-24 DIAGNOSIS — K3533 Acute appendicitis with perforation and localized peritonitis, with abscess: Secondary | ICD-10-CM | POA: Diagnosis not present

## 2018-02-24 DIAGNOSIS — Z4801 Encounter for change or removal of surgical wound dressing: Secondary | ICD-10-CM | POA: Diagnosis not present

## 2018-02-24 NOTE — Patient Instructions (Signed)
I think this is a bony prominence  at end of clavicle .     And may be noted because you have lost weigh tin your neck area.   Usually a benign process . If x ray ok lets observe and  Recheck if changing .

## 2018-02-26 ENCOUNTER — Ambulatory Visit (INDEPENDENT_AMBULATORY_CARE_PROVIDER_SITE_OTHER): Payer: Medicare Other | Admitting: Pulmonary Disease

## 2018-02-26 ENCOUNTER — Encounter: Payer: Self-pay | Admitting: Pulmonary Disease

## 2018-02-26 DIAGNOSIS — Z6841 Body Mass Index (BMI) 40.0 and over, adult: Secondary | ICD-10-CM

## 2018-02-26 DIAGNOSIS — I10 Essential (primary) hypertension: Secondary | ICD-10-CM | POA: Diagnosis not present

## 2018-02-26 DIAGNOSIS — Z9989 Dependence on other enabling machines and devices: Secondary | ICD-10-CM

## 2018-02-26 DIAGNOSIS — G4733 Obstructive sleep apnea (adult) (pediatric): Secondary | ICD-10-CM | POA: Diagnosis not present

## 2018-02-26 NOTE — Assessment & Plan Note (Signed)
If she loses significant weight, may need lower pressure on CPAP

## 2018-02-26 NOTE — Progress Notes (Signed)
Subjective:    Patient ID: Rebecca Rich, female    DOB: 11-Aug-1945, 73 y.o.   MRN: [id number]  HPI  Chief Complaint  Patient presents with  . Sleep Consult    Former [facility] patient for sleep. Believes she needs a new CPAP machine. Per patient she is not sleeping as well. Current machine is at least 75 years old.    73 year old obese woman presents to reestablish care for obstructive sleep apnea. She was diagnosed in 1995 at [facility] and has been on CPAP since then she used her first machine for about 20 years.  She is on her second machine now which is a Print production planner and she has had this for at least 8 years.  The humidifier is broken down on this.  She feels that this puts out too much air and she is not sure if she is febrile with the settings or the settings have changed.  I am unable to get a download on the machine to understand what settings it is on.  It seems to have a ramp of 5 minutes. She uses nasal pillows. She had a prolonged admission for acute perforated appendicitis in 12/2017 and required appendectomy, postop course was complicated by C. difficile and she has lost about 30 pounds since then to her current weight of 262 pounds.  Epworth sleepiness score is 10 and she reports excessive daytime somnolence and fatigue while watching TV, as a passenger in a car or lying down to rest in the afternoon.  She admits to frequent naps. Bedtime is variable can be as early as 11:30 PM or as late as 2 AM, she tends to read in bed Sleep latency is now increased to about 30 minutes even with her CPAP on, reports 2-3 nocturnal awakenings due to too much air on the CPAP and dryness, she sleeps on her back with one pillow, is out of bed latest by 10 AM feeling tired with dryness of mouth.   There is no history suggestive of cataplexy, sleep paralysis or parasomnias     Significant tests/ events reviewed  08/1993 NPSG -at [facility] 85/hour corrected by CPAP 10 cm  12/1998-weight 3 1 4   pounds-CPAP titration 17 cm?   Past Medical History:  Diagnosis Date  . Acute bronchospasm 08/24/2009  . Acute lower GI bleeding 02/16/2017  . Acute sphenoidal sinusitis 02/26/2010   Qualifier: Diagnosis of  By: Regis Bill MD, Standley Brooking   . CARDIAC MURMUR 12/08/2006  . COLONIC POLYPS, HX OF 12/08/2006  . DIABETES MELLITUS, TYPE II 12/08/2006  . Gallbladder/common duct stone, without infection, with obstruction 03/01/2010   removed ercp  . HYPERLIPIDEMIA 12/08/2006  . HYPERTENSION 12/08/2006  . 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 INFECTION, SKIN AND SOFT TISSUE 07/28/2008  . 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 LIVER FUNCTION TESTS, ABNORMAL 07/28/2008  . Morbid obesity ([place]) 12/08/2006  . OBESITY 09/24/2009  . OBSTRUCTIVE SLEEP APNEA 12/08/2006  . OSTEOARTHRITIS 12/08/2006  . RUQ PAIN 06/16/2008  . SHOULDER PAIN, RIGHT 02/07/2008  . Sleep apnea   . Swelling of limb 07/28/2008  . THYROID FUNCTION TEST, ABNORMAL 12/08/2006    Past Surgical History:  Procedure Laterality Date  . ABDOMINAL HYSTERECTOMY    . CHOLECYSTECTOMY  06/27/2008  . COLONOSCOPY N/A 12/13/2012   Procedure: COLONOSCOPY;  Surgeon: Juanita Craver, MD;  Location: [facility];  Service: Endoscopy;  Laterality: N/A;  . COLONOSCOPY Left 02/17/2017   Procedure: COLONOSCOPY;  Surgeon: Carol Ada, MD;  Location: [facility];  Service: Endoscopy;  Laterality: Left;  . common duct stone     ERCP   . LAPAROSCOPIC APPENDECTOMY N/A 12/30/2017   Procedure: APPENDECTOMY LAPAROSCOPIC;  Surgeon: Stark Klein, MD;  Location: [facility];  Service: General;  Laterality: N/A;    No Known Allergies Social History   Socioeconomic History  . Marital status: Divorced    Spouse name: Not on file  . Number of children: Not on file  . Years of education: Not on file  . Highest education level: Not on file  Occupational History  . Occupation: retired    Fish farm manager: RETIRED  Social Needs  . Financial resource strain: Not on file  . Food insecurity:    Worry: Not  on file    Inability: Not on file  . Transportation needs:    Medical: Not on file    Non-medical: Not on file  Tobacco Use  . Smoking status: Never Smoker  . Smokeless tobacco: Never Used  Substance and Sexual Activity  . Alcohol use: No    Alcohol/week: 0.0 standard drinks  . Drug use: No  . Sexual activity: Not on file  Lifestyle  . Physical activity:    Days per week: Not on file    Minutes per session: Not on file  . Stress: Not on file  Relationships  . Social connections:    Talks on phone: Not on file    Gets together: Not on file    Attends religious service: Not on file    Active member of club or organization: Not on file    Attends meetings of clubs or organizations: Not on file    Relationship status: Not on file  . Intimate partner violence:    Fear of current or ex partner: Not on file    Emotionally abused: Not on file    Physically abused: Not on file    Forced sexual activity: Not on file  Other Topics Concern  . Not on file  Social History Narrative   Master level education in math   Pt is currently retired   Pt is divorced with children   Recently had to move had a break in and thus away from her pool exercise    Family History  Problem Relation Age of Onset  . Other Mother        blood clots  . Cervical cancer Mother   . Cancer Mother   . Heart disease Brother   . Diabetes Brother   . Heart disease Sister   . Diabetes Sister   . Heart disease Brother   . Diabetes Brother   . Heart disease Brother   . Diabetes Brother   . Stroke Sister   . Diabetes Sister   . Throat cancer Father   . Cancer Father   . Diabetes Other        all siblings, 4 brothers, 5 sisters     Review of Systems  Constitutional: Negative for fever and unexpected weight change.  HENT: Negative for congestion, dental problem, ear pain, nosebleeds, postnasal drip, rhinorrhea, sinus pressure, sneezing, sore throat and trouble swallowing.   Eyes: Negative for redness  and itching.  Respiratory: Negative for cough, chest tightness, shortness of breath and wheezing.   Cardiovascular: Negative for palpitations and leg swelling.  Gastrointestinal: Negative for nausea and vomiting.  Genitourinary: Negative for dysuria.  Musculoskeletal: Negative for joint swelling.  Skin: Negative for rash.  Allergic/Immunologic: Negative.  Negative for environmental allergies, food allergies and immunocompromised state.  Neurological:  Negative for headaches.  Hematological: Does not bruise/bleed easily.  Psychiatric/Behavioral: Negative for dysphoric mood. The patient is not nervous/anxious.        Objective:   Physical Exam   Gen. Pleasant, obese, in no distress, normal affect ENT - no pallor,icterus, no post nasal drip, class 3 airway Neck: No JVD, no thyromegaly, no carotid bruits Lungs: no use of accessory muscles, no dullness to percussion, decreased without rales or rhonchi  Cardiovascular: Rhythm regular, heart sounds  normal, no murmurs or gallops, no peripheral edema Abdomen: soft and non-tender, no hepatosplenomegaly, BS normal. Musculoskeletal: No deformities, no cyanosis or clubbing Neuro:  alert, non focal, no tremors        Assessment & Plan:

## 2018-02-26 NOTE — Patient Instructions (Signed)
Call us with the name of your DME company and we will have them change settings on your machine to auto  Schedule home sleep study. Based on this we will get you a new machine

## 2018-02-26 NOTE — Assessment & Plan Note (Signed)
She is complaining about high pressure in the machine.  It is conceivable that due to weight loss she needs lower pressure.  I am unable to get a download on her machine today but it is old and they are unable to get a humidifier for her and she needs replacement anyways  Call us with the name of your DME company and we will have them change settings on your machine to auto  Schedule home sleep study. Based on this we will get you a new machine on auto settings with nasal pillows.  We can then obtain a download and tweak settings as required  Weight loss encouraged, compliance with goal of at least 4-6 hrs every night is the expectation. Advised against medications with sedative side effects Cautioned against driving when sleepy - understanding that sleepiness will vary on a day to day basis

## 2018-02-26 NOTE — Assessment & Plan Note (Signed)
Blood pressure controlled on 3 meds

## 2018-03-02 DIAGNOSIS — A419 Sepsis, unspecified organism: Secondary | ICD-10-CM | POA: Diagnosis not present

## 2018-03-02 DIAGNOSIS — K3533 Acute appendicitis with perforation and localized peritonitis, with abscess: Secondary | ICD-10-CM | POA: Diagnosis not present

## 2018-03-02 DIAGNOSIS — I119 Hypertensive heart disease without heart failure: Secondary | ICD-10-CM | POA: Diagnosis not present

## 2018-03-02 DIAGNOSIS — Z4801 Encounter for change or removal of surgical wound dressing: Secondary | ICD-10-CM | POA: Diagnosis not present

## 2018-03-02 DIAGNOSIS — Z48815 Encounter for surgical aftercare following surgery on the digestive system: Secondary | ICD-10-CM | POA: Diagnosis not present

## 2018-03-02 DIAGNOSIS — E1165 Type 2 diabetes mellitus with hyperglycemia: Secondary | ICD-10-CM | POA: Diagnosis not present

## 2018-03-03 ENCOUNTER — Telehealth: Payer: Self-pay | Admitting: Pulmonary Disease

## 2018-03-03 NOTE — Telephone Encounter (Signed)
Spoke with Jeanett Schlein from [facility].  She was requesting a newer sleep study or a download from pt machine.  The only sleep study we had was from [place] and she already had that study.  No download noted in airview or media.  Has home sleep study ordered but not scheduled. Jeanett Schlein is reaching out to pt to see if she can bring machine to [place] for a download.  PCC's, do you know when this will be scheduled?

## 2018-03-05 NOTE — Progress Notes (Signed)
Cardiology Office Note    Date:  03/08/2018   ID:  Rebecca Rich 05/29/1945, MRN [id number]  PCP:  Burnis Medin, MD  Cardiologist:  Dr. Harrington Challenger  Chief Complaint: Hospital follow up  History of Present Illness:   Rebecca Rich is a 73 y.o. female with a hx of DM, HTN, HLD and obesity who admitted 12/2017 with acute appendicitis s/p appendectomy. Seen by cardiologist for SVT and elevated troponin. No recurrence of tachycardia on BB. Elevated troponin felt to be demand ischemia in the setting of supraventricular tachycardia. Echo showed normal LVEF, grade 1 DD, mild aortic stenosis/moderate aortic insufficiency.  Patient denied chest pain. Plan to get outpatient stress test.   Here today for follow up.  She ran out of metoprolol succinate 2 weeks ago.  Denies palpitation, chest pain, dyspnea, orthopnea, PND, syncope, lower extremity edema or melena.  No regular exercise.  Compliant with medication otherwise.   Past Medical History:  Diagnosis Date  . Acute bronchospasm 08/24/2009  . Acute lower GI bleeding 02/16/2017  . Acute sphenoidal sinusitis 02/26/2010   Qualifier: Diagnosis of  By: Regis Bill MD, Standley Brooking   . CARDIAC MURMUR 12/08/2006  . COLONIC POLYPS, HX OF 12/08/2006  . DIABETES MELLITUS, TYPE II 12/08/2006  . Gallbladder/common duct stone, without infection, with obstruction 03/01/2010   removed ercp  . HYPERLIPIDEMIA 12/08/2006  . HYPERTENSION 12/08/2006  . 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 INFECTION, SKIN AND SOFT TISSUE 07/28/2008  . 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 LIVER FUNCTION TESTS, ABNORMAL 07/28/2008  . Morbid obesity ([place]) 12/08/2006  . OBESITY 09/24/2009  . OBSTRUCTIVE SLEEP APNEA 12/08/2006  . OSTEOARTHRITIS 12/08/2006  . RUQ PAIN 06/16/2008  . SHOULDER PAIN, RIGHT 02/07/2008  . Sleep apnea   . Swelling of limb 07/28/2008  . THYROID FUNCTION TEST, ABNORMAL 12/08/2006    Past Surgical History:  Procedure Laterality Date  . ABDOMINAL HYSTERECTOMY    . CHOLECYSTECTOMY   06/27/2008  . COLONOSCOPY N/A 12/13/2012   Procedure: COLONOSCOPY;  Surgeon: Juanita Craver, MD;  Location: [facility];  Service: Endoscopy;  Laterality: N/A;  . COLONOSCOPY Left 02/17/2017   Procedure: COLONOSCOPY;  Surgeon: Carol Ada, MD;  Location: [facility];  Service: Endoscopy;  Laterality: Left;  . common duct stone     ERCP   . LAPAROSCOPIC APPENDECTOMY N/A 12/30/2017   Procedure: APPENDECTOMY LAPAROSCOPIC;  Surgeon: Stark Klein, MD;  Location: [facility];  Service: General;  Laterality: N/A;    Current Medications: Prior to Admission medications   Medication Sig Start Date End Date Taking? Authorizing Provider  acetaminophen (TYLENOL) 500 MG tablet Take 2 tablets (1,000 mg total) by mouth every 8 (eight) hours as needed. 01/07/18   Earnstine Regal, PA-C  amLODipine (NORVASC) 5 MG tablet TAKE 1 TABLET BY MOUTH EVERY DAY 01/25/18   Panosh, Standley Brooking, MD  chlorthalidone (HYGROTON) 25 MG tablet TAKE 1 TABLET BY MOUTH EVERY DAY 01/25/18   Panosh, Standley Brooking, MD  cholecalciferol (VITAMIN D) 1000 UNITS tablet Take 1,000 Units by mouth daily.    [provider]  glucose blood (ACCU-CHEK AVIVA PLUS) test strip Use to check blood sugar daily E11.9 08/26/16   Panosh, Standley Brooking, MD  Lancets (ACCU-CHEK SOFT TOUCH) lancets Use to test blood glucose twice daily 08/26/16   Panosh, Standley Brooking, MD  levothyroxine (SYNTHROID, LEVOTHROID) 75 MCG tablet TAKE 1 TABLET (75 MCG TOTAL) BY MOUTH DAILY BEFORE BREAKFAST. 12/17/17   Panosh, Standley Brooking, MD  losartan (COZAAR) 100 MG tablet Take 1  tablet (100 mg total) by mouth daily. May begin at 50 mg per day and increase to 100 mg 01/07/18   Earnstine Regal, PA-C  Misc Natural Products (OSTEO BI-FLEX ADV JOINT SHIELD PO) Take 1 tablet by mouth daily.    [provider]  Multiple Vitamins-Minerals (CENTRUM SILVER PO) Take 1 tablet by mouth daily.      [provider]  potassium chloride (K-DUR) 10 MEQ tablet Take 1 tablet (10 mEq total) by mouth 3  (three) times daily. 01/22/18   Panosh, Standley Brooking, MD  saccharomyces boulardii (FLORASTOR) 250 MG capsule You can buy this or any other probiotic over the counter at any drug store.  I would take it for at least the next month.  Follow package directions. 01/08/18   Earnstine Regal, PA-C    Allergies:   Patient has no known allergies.   Social History   Socioeconomic History  . Marital status: Divorced    Spouse name: Not on file  . Number of children: Not on file  . Years of education: Not on file  . Highest education level: Not on file  Occupational History  . Occupation: retired    Fish farm manager: RETIRED  Social Needs  . Financial resource strain: Not on file  . Food insecurity:    Worry: Not on file    Inability: Not on file  . Transportation needs:    Medical: Not on file    Non-medical: Not on file  Tobacco Use  . Smoking status: Never Smoker  . Smokeless tobacco: Never Used  Substance and Sexual Activity  . Alcohol use: No    Alcohol/week: 0.0 standard drinks  . Drug use: No  . Sexual activity: Not on file  Lifestyle  . Physical activity:    Days per week: Not on file    Minutes per session: Not on file  . Stress: Not on file  Relationships  . Social connections:    Talks on phone: Not on file    Gets together: Not on file    Attends religious service: Not on file    Active member of club or organization: Not on file    Attends meetings of clubs or organizations: Not on file    Relationship status: Not on file  Other Topics Concern  . Not on file  Social History Narrative   Master level education in math   Pt is currently retired   Pt is divorced with children   Recently had to move had a break in and thus away from her pool exercise     Family History:  The patient's family history includes Cancer in her father and mother; Cervical cancer in her mother; Diabetes in her brother, brother, brother, sister, sister, and another family member; Heart disease in her  brother, brother, brother, and sister; Other in her mother; Stroke in her sister; Throat cancer in her father.   ROS:   Please see the history of present illness.    ROS All other systems reviewed and are negative.   PHYSICAL EXAM:   VS:  BP 126/60   Pulse 84   Ht 5\' 1"  (1.549 m)   Wt 267 lb 12.8 oz (121.5 kg)   BMI 50.60 kg/m    GEN: Obese female in no acute distress  HEENT: normal  Neck: no JVD, carotid bruits, or masses Cardiac: RRR; no murmurs, rubs, or gallops,no edema  Respiratory:  clear to auscultation bilaterally, normal work of breathing GI: soft, nontender, nondistended, +  BS MS: no deformity or atrophy  Skin: warm and dry, no rash Neuro:  Alert and Oriented x 3, Strength and sensation are intact Psych: euthymic mood, full affect  Wt Readings from Last 3 Encounters:  03/08/18 267 lb 12.8 oz (121.5 kg)  02/26/18 262 lb (118.8 kg)  02/24/18 263 lb 12.8 oz (119.7 kg)      Studies/Labs Reviewed:   EKG:  EKG is ordered today.  The ekg ordered today demonstrates sinus rhythm at rate of 84 bpm, PAC  Recent Labs: 01/02/2018: TSH 4.906 01/04/2018: ALT 22 01/06/2018: BUN 15; Creatinine, Ser 0.83; Magnesium 2.0; Potassium 4.3; Sodium 138 01/08/2018: Hemoglobin 10.6; Platelets 250   Lipid Panel    Component Value Date/Time   CHOL 124 06/09/2017 0937   TRIG 87.0 06/09/2017 0937   HDL 53.50 06/09/2017 0937   CHOLHDL 2 06/09/2017 0937   VLDL 17.4 06/09/2017 0937   LDLCALC 53 06/09/2017 0937    Additional studies/ records that were reviewed today include:   Echocardiogram: 12/31/17  Study Conclusions  - Left ventricle: The cavity size was normal. There was moderate   concentric hypertrophy. Systolic function was normal. The   estimated ejection fraction was in the range of 60% to 65%. Wall   motion was normal; there were no regional wall motion   abnormalities. Doppler parameters are consistent with abnormal   left ventricular relaxation (grade 1 diastolic  dysfunction).   Doppler parameters are consistent with elevated ventricular   end-diastolic filling pressure. - Aortic valve: Trileaflet; moderately thickened, moderately   calcified leaflets. 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 There was moderate   regurgitation. Mean gradient (S): 10 mm Hg. Peak gradient (S): 19   mm Hg. Valve area (VTI): 2.44 cm^2. Valve area (Vmax): 2.54 cm^2.   Valve area (Vmean): 2.63 cm^2. - Mitral valve: Calcified annulus. Mildly thickened leaflets .   There was no regurgitation. - Right ventricle: The cavity size was mildly dilated. Wall   thickness was normal. Systolic function was mildly reduced. - Tricuspid valve: There was mild regurgitation. - Pulmonary arteries: Systolic pressure was within the normal   range. - Pericardium, extracardiac: There was no pericardial effusion.    ASSESSMENT & PLAN:    1. Elevated troponin in setting of appendicitis - No chest pain. Will get stress test as recommended during admission. Cardiac risk factors includes HTN, HLD, DM and obesity.   2. Mild aortic stenosis/moderate aortic regurgitation - Follow with yearly echo  3. HTN -Blood pressure stable on current medication.  4. SVT - No palpitations. Continue BB (ran out recently due to no refill).    Medication Adjustments/Labs and Tests Ordered: Current medicines are reviewed at length with the patient today.  Concerns regarding medicines are outlined above.  Medication changes, Labs and Tests ordered today are listed in the Patient Instructions below. Patient Instructions  Medication Instructions:  Your physician has recommended you make the following change in your medication: Start Toprol XL  ((25 mg ) daily, sent in today to patient's requested pharmacy.    Labwork: -None  Testing/Procedures: Your physician has requested that you have a lexiscan myoview. For further information please visit [email or web address]. Please follow instruction sheet, as  given.    Follow-Up: Your physician recommends that you keep your scheduled follow-up appointment  with Dr.Ross.    Any Other Special Instructions Will Be Listed Below (If Applicable).       If you need a refill on your cardiac medications before your next appointment, please call your  pharmacy.      Jarrett Soho, [place]  03/08/2018 10:38 AM    [facility] [address], [place], [place]  [address] Phone: [phone]; Fax: [phone]

## 2018-03-08 ENCOUNTER — Ambulatory Visit (INDEPENDENT_AMBULATORY_CARE_PROVIDER_SITE_OTHER): Payer: Medicare Other | Admitting: Physician Assistant

## 2018-03-08 ENCOUNTER — Encounter: Payer: Self-pay | Admitting: Physician Assistant

## 2018-03-08 ENCOUNTER — Encounter: Payer: Self-pay | Admitting: *Deleted

## 2018-03-08 VITALS — BP 126/60 | HR 84 | Ht 61.0 in | Wt 267.8 lb

## 2018-03-08 DIAGNOSIS — R7989 Other specified abnormal findings of blood chemistry: Secondary | ICD-10-CM

## 2018-03-08 DIAGNOSIS — I471 Supraventricular tachycardia, unspecified: Secondary | ICD-10-CM

## 2018-03-08 DIAGNOSIS — I10 Essential (primary) hypertension: Secondary | ICD-10-CM

## 2018-03-08 DIAGNOSIS — I350 Nonrheumatic aortic (valve) stenosis: Secondary | ICD-10-CM

## 2018-03-08 DIAGNOSIS — R778 Other specified abnormalities of plasma proteins: Secondary | ICD-10-CM

## 2018-03-08 MED ORDER — METOPROLOL SUCCINATE ER 25 MG PO TB24: 25.0000 mg | ORAL_TABLET | Freq: Every day | ORAL | 2 refills | Status: DC

## 2018-03-08 NOTE — Telephone Encounter (Signed)
I pulled the HST order out of LIbby's office & contacted pt to schedule an appt.  Had to leave a VM.

## 2018-03-08 NOTE — Telephone Encounter (Signed)
Routed to [facility] for update

## 2018-03-08 NOTE — Patient Instructions (Addendum)
Medication Instructions:  Your physician has recommended you make the following change in your medication: Start Toprol XL  ((25 mg ) daily, sent in today to patient's requested pharmacy.    Labwork: -None  Testing/Procedures: Your physician has requested that you have a lexiscan myoview. For further information please visit [email or web address]. Please follow instruction sheet, as given.    Follow-Up: Your physician recommends that you keep your scheduled follow-up appointment  with Dr.Ross.    Any Other Special Instructions Will Be Listed Below (If Applicable).       If you need a refill on your cardiac medications before your next appointment, please call your pharmacy.

## 2018-03-08 NOTE — Telephone Encounter (Signed)
Patient returning [name]'s call.  Patient phone number is [phone].

## 2018-03-09 NOTE — Telephone Encounter (Signed)
lmtcb pt to know holly will call her back once sheis back in the office and she may call me back if she needs to Rebecca Rich

## 2018-03-11 NOTE — Telephone Encounter (Signed)
Spoke to patient & scheduled for pick up on 2/24 @ 3:00.  Nothing further needed at this time per pt.

## 2018-03-14 ENCOUNTER — Other Ambulatory Visit: Payer: Self-pay | Admitting: Internal Medicine

## 2018-03-17 ENCOUNTER — Telehealth (HOSPITAL_COMMUNITY): Payer: Self-pay | Admitting: *Deleted

## 2018-03-17 NOTE — Telephone Encounter (Signed)
Patient given detailed instructions per Myocardial Perfusion Study Information Sheet for the test on 03/22/18 at 1030. Patient notified to arrive 15 minutes early and that it is imperative to arrive on time for appointment to keep from having the test rescheduled.  If you need to cancel or reschedule your appointment, please call the office within 24 hours of your appointment. . Patient verbalized understanding.[name], [place]

## 2018-03-17 NOTE — Telephone Encounter (Signed)
Left message on voicemail in reference to upcoming appointment scheduled for 03/22/18. Phone number given for a call back so details instructions can be given. Madalaine Portier, [place]

## 2018-03-22 ENCOUNTER — Ambulatory Visit (HOSPITAL_COMMUNITY): Payer: Medicare Other | Attending: Cardiology

## 2018-03-22 DIAGNOSIS — G4733 Obstructive sleep apnea (adult) (pediatric): Secondary | ICD-10-CM

## 2018-03-22 DIAGNOSIS — R7989 Other specified abnormal findings of blood chemistry: Secondary | ICD-10-CM | POA: Diagnosis not present

## 2018-03-22 DIAGNOSIS — I471 Supraventricular tachycardia: Secondary | ICD-10-CM | POA: Diagnosis not present

## 2018-03-22 DIAGNOSIS — Z9989 Dependence on other enabling machines and devices: Principal | ICD-10-CM

## 2018-03-22 DIAGNOSIS — I10 Essential (primary) hypertension: Secondary | ICD-10-CM | POA: Diagnosis not present

## 2018-03-22 DIAGNOSIS — R778 Other specified abnormalities of plasma proteins: Secondary | ICD-10-CM

## 2018-03-22 MED ORDER — TECHNETIUM TC 99M TETROFOSMIN IV KIT
31.3000 | PACK | Freq: Once | INTRAVENOUS | Status: AC | PRN
  Administered 2018-03-22: 31.3 via INTRAVENOUS
  Filled 2018-03-22: qty 32

## 2018-03-22 MED ORDER — REGADENOSON 0.4 MG/5ML IV SOLN
0.4000 mg | Freq: Once | INTRAVENOUS | Status: AC
  Administered 2018-03-22: 0.4 mg via INTRAVENOUS

## 2018-03-23 ENCOUNTER — Ambulatory Visit (HOSPITAL_COMMUNITY): Payer: Medicare Other | Attending: Cardiology

## 2018-03-23 DIAGNOSIS — G4733 Obstructive sleep apnea (adult) (pediatric): Secondary | ICD-10-CM | POA: Diagnosis not present

## 2018-03-23 LAB — MYOCARDIAL PERFUSION IMAGING
LV dias vol: 91 mL (ref 46–106)
LV sys vol: 34 mL
Peak HR: 85 {beats}/min
Rest HR: 73 {beats}/min
SDS: 3
SRS: 0
SSS: 3
TID: 0.97

## 2018-03-23 MED ORDER — TECHNETIUM TC 99M TETROFOSMIN IV KIT
30.6000 | PACK | Freq: Once | INTRAVENOUS | Status: AC | PRN
  Administered 2018-03-23: 30.6 via INTRAVENOUS
  Filled 2018-03-23: qty 31

## 2018-03-24 ENCOUNTER — Telehealth: Payer: Self-pay | Admitting: Pulmonary Disease

## 2018-03-24 DIAGNOSIS — G4733 Obstructive sleep apnea (adult) (pediatric): Secondary | ICD-10-CM | POA: Diagnosis not present

## 2018-03-24 DIAGNOSIS — Z9989 Dependence on other enabling machines and devices: Principal | ICD-10-CM

## 2018-03-24 NOTE — Telephone Encounter (Signed)
HST results received. Per RA, pt has severe OSA with 76 apnea events per hour and also had severe desaturations.  Treatment: CPAP titration study.  Attempted to call pt but unable to reach her. Left message for pt to return call.

## 2018-03-25 NOTE — Telephone Encounter (Signed)
Pt is calling back [phone]

## 2018-03-25 NOTE — Telephone Encounter (Signed)
Called and spoke with pt letting her know the results of HST and stated to her based on results, RA wants her to have cpap titration. Pt expressed understanding.  Order for cpap titration has been placed. Nothing further needed.

## 2018-03-29 ENCOUNTER — Other Ambulatory Visit (INDEPENDENT_AMBULATORY_CARE_PROVIDER_SITE_OTHER): Payer: Medicare Other

## 2018-03-29 DIAGNOSIS — I10 Essential (primary) hypertension: Secondary | ICD-10-CM | POA: Diagnosis not present

## 2018-03-29 DIAGNOSIS — E039 Hypothyroidism, unspecified: Secondary | ICD-10-CM

## 2018-03-29 DIAGNOSIS — E669 Obesity, unspecified: Secondary | ICD-10-CM | POA: Diagnosis not present

## 2018-03-29 DIAGNOSIS — E1169 Type 2 diabetes mellitus with other specified complication: Secondary | ICD-10-CM | POA: Diagnosis not present

## 2018-03-29 DIAGNOSIS — Z79899 Other long term (current) drug therapy: Secondary | ICD-10-CM

## 2018-03-29 DIAGNOSIS — D649 Anemia, unspecified: Secondary | ICD-10-CM | POA: Diagnosis not present

## 2018-03-29 LAB — BASIC METABOLIC PANEL
BUN: 20 mg/dL (ref 6–23)
CO2: 25 mEq/L (ref 19–32)
Calcium: 9 mg/dL (ref 8.4–10.5)
Chloride: 105 mEq/L (ref 96–112)
Creatinine, Ser: 0.98 mg/dL (ref 0.40–1.20)
GFR: 66.95 mL/min (ref >60.00)
GLUCOSE: 130 mg/dL — AB (ref 70–99)
Potassium: 3.7 mEq/L (ref 3.5–5.1)
Sodium: 140 mEq/L (ref 135–145)

## 2018-03-29 LAB — CBC WITH DIFFERENTIAL/PLATELET
Basophils Absolute: 0 10*3/uL (ref 0.0–0.1)
Basophils Relative: 0.5 % (ref 0.0–3.0)
Eosinophils Absolute: 0.2 10*3/uL (ref 0.0–0.7)
Eosinophils Relative: 2.9 % (ref 0.0–5.0)
HCT: 36 % (ref 36.0–46.0)
HEMOGLOBIN: 12.1 g/dL (ref 12.0–15.0)
Lymphocytes Relative: 42.9 % (ref 12.0–46.0)
Lymphs Abs: 2.2 10*3/uL (ref 0.7–4.0)
MCHC: 33.6 g/dL (ref 30.0–36.0)
MCV: 88.6 fl (ref 78.0–100.0)
Monocytes Absolute: 0.4 10*3/uL (ref 0.1–1.0)
Monocytes Relative: 8.2 % (ref 3.0–12.0)
Neutro Abs: 2.4 10*3/uL (ref 1.4–7.7)
Neutrophils Relative %: 45.5 % (ref 43.0–77.0)
Platelets: 156 10*3/uL (ref 150.0–400.0)
RBC: 4.07 Mil/uL (ref 3.87–5.11)
RDW: 14.5 % (ref 11.5–15.5)
WBC: 5.2 10*3/uL (ref 4.0–10.5)

## 2018-03-29 LAB — LIPID PANEL
Cholesterol: 151 mg/dL (ref 0–200)
HDL: 55.4 mg/dL (ref >39.00)
LDL Cholesterol: 69 mg/dL (ref 0–99)
NonHDL: 95.72
Total CHOL/HDL Ratio: 3
Triglycerides: 132 mg/dL (ref 0.0–149.0)
VLDL: 26.4 mg/dL (ref 0.0–40.0)

## 2018-03-29 LAB — TSH: TSH: 2.77 u[IU]/mL (ref 0.35–4.50)

## 2018-03-29 LAB — HEMOGLOBIN A1C: Hgb A1c MFr Bld: 6.7 % — ABNORMAL HIGH (ref 4.6–6.5)

## 2018-03-30 ENCOUNTER — Ambulatory Visit (INDEPENDENT_AMBULATORY_CARE_PROVIDER_SITE_OTHER): Payer: Medicare Other | Admitting: Sports Medicine

## 2018-03-30 ENCOUNTER — Encounter: Payer: Self-pay | Admitting: Sports Medicine

## 2018-03-30 DIAGNOSIS — E119 Type 2 diabetes mellitus without complications: Secondary | ICD-10-CM

## 2018-03-30 DIAGNOSIS — M79676 Pain in unspecified toe(s): Secondary | ICD-10-CM

## 2018-03-30 DIAGNOSIS — B351 Tinea unguium: Secondary | ICD-10-CM

## 2018-03-30 DIAGNOSIS — M79609 Pain in unspecified limb: Principal | ICD-10-CM

## 2018-03-30 NOTE — Progress Notes (Signed)
Subjective: Rebecca Rich is a 73 y.o. female patient with history of diabetes who presents to office today complaining of long,mildly painful nails  while ambulating in shoes; unable to trim. Reports that she sometimes get pain to left great toe at the corners. Patient denies any pain to toe today. Patient states that the glucose reading this morning was not recorded but last night was 130 mg/dl. Patient denies any new changes in medication or new problems. Patient denies any new cramping, numbness, burning or tingling in the legs.  Reports that A1c is unknown has blood-work yesterday.   Will see PCP next week  Patient Active Problem List   Diagnosis Date Noted  . SVT (supraventricular tachycardia) ([place]) 12/31/2017  . Acute perforated appendicitis w abscess s/p lap appendectomy 12/30/2017 12/30/2017  . Diverticulosis of intestine with bleeding   . Rectal bleeding   . Hx of gout 08/21/2015  . Mild aortic stenosis 08/21/2015  . Muscle spasm 03/22/2013  . Skin lesion of back 09/13/2012  . Left shoulder pain 01/20/2012  . Hypothyroid 05/31/2011  . Elevated uric acid in blood 05/31/2011  . Palmar nodule 03/25/2011  . Abnormal urine 02/20/2011  . 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 SHOULDER PAIN, RIGHT 02/07/2008  . Diabetes mellitus type 2 in obese ([place]) 12/08/2006  . HYPERLIPIDEMIA 12/08/2006  . Morbid obesity with BMI of 50.0-59.9, adult ([place]) 12/08/2006  . OSA on CPAP 12/08/2006  . Essential hypertension 12/08/2006  . OSTEOARTHRITIS 12/08/2006  . THYROID FUNCTION TEST, ABNORMAL 12/08/2006  . COLONIC POLYPS, HX OF 12/08/2006   Current Outpatient Medications on File Prior to Visit  Medication Sig Dispense Refill  . amLODipine (NORVASC) 5 MG tablet TAKE 1 TABLET BY MOUTH EVERY DAY 90 tablet 0  . chlorthalidone (HYGROTON) 25 MG tablet TAKE 1 TABLET BY MOUTH EVERY DAY 90 tablet 0  . cholecalciferol (VITAMIN D) 1000 UNITS tablet Take 1,000 Units by mouth daily.     Marland Kitchen glucose blood (ACCU-CHEK AVIVA PLUS) test strip Use to check blood sugar daily E11.9 100 each 12  . Lancets (ACCU-CHEK SOFT TOUCH) lancets Use to test blood glucose twice daily 50 each 12  . levothyroxine (SYNTHROID, LEVOTHROID) 75 MCG tablet TAKE 1 TABLET (75 MCG TOTAL) BY MOUTH DAILY BEFORE BREAKFAST. 90 tablet 0  . losartan (COZAAR) 100 MG tablet Take 1 tablet (100 mg total) by mouth daily. May begin at 50 mg per day and increase to 100 mg    . metoprolol succinate (TOPROL XL) 25 MG 24 hr tablet Take 1 tablet (25 mg total) by mouth daily. 90 tablet 2  . Misc Natural Products (OSTEO BI-FLEX ADV JOINT SHIELD PO) Take 1 tablet by mouth daily.    . Multiple Vitamins-Minerals (CENTRUM SILVER PO) Take 1 tablet by mouth daily.      . potassium chloride (K-DUR) 10 MEQ tablet Take 1 tablet (10 mEq total) by mouth 3 (three) times daily. 90 tablet 5   No current facility-administered medications on file prior to visit.    No Known Allergies  Recent Results (from the past 2160 hour(s))  Glucose, capillary     Status: Abnormal   Collection Time: 12/30/17 11:59 PM  Result Value Ref Range   Glucose-Capillary 172 (H) 70 - 99 mg/dL  MRSA PCR Screening     Status: None   Collection Time: 12/31/17 12:53 AM  Result Value Ref Range   MRSA by PCR NEGATIVE NEGATIVE    Comment:  The GeneXpert MRSA Assay (FDA approved for NASAL specimens only), is one component of a comprehensive MRSA colonization surveillance program. It is not intended to diagnose MRSA infection nor to guide or monitor treatment for MRSA infections. Performed at [facility], [place] [address] [place], [place] [address]   Urinalysis, Routine w reflex microscopic     Status: Abnormal   Collection Time: 12/31/17  1:37 AM  Result Value Ref Range   Color, Urine YELLOW YELLOW   APPearance HAZY (A) CLEAR   Specific Gravity, Urine 1.042 (H) 1.005 - 1.030   pH 5.0 5.0 - 8.0   Glucose, UA NEGATIVE NEGATIVE  mg/dL   Hgb urine dipstick NEGATIVE NEGATIVE   Bilirubin Urine NEGATIVE NEGATIVE   Ketones, ur NEGATIVE NEGATIVE mg/dL   Protein, ur 30 (A) NEGATIVE mg/dL   Nitrite NEGATIVE NEGATIVE   Leukocytes, UA NEGATIVE NEGATIVE   RBC / HPF 0-5 0 - 5 RBC/hpf   WBC, UA 0-5 0 - 5 WBC/hpf   Bacteria, UA RARE (A) NONE SEEN   Squamous Epithelial / LPF 0-5 0 - 5   Mucus PRESENT     Comment: Performed at [facility], [place] [address] [place], [place] [address]  Glucose, capillary     Status: Abnormal   Collection Time: 12/31/17  2:33 AM  Result Value Ref Range   Glucose-Capillary 237 (H) 70 - 99 mg/dL   Comment 1 Notify RN    Comment 2 Document in Chart   CBC     Status: Abnormal   Collection Time: 12/31/17  3:14 AM  Result Value Ref Range   WBC 12.6 (H) 4.0 - 10.5 K/uL   RBC 4.14 3.87 - 5.11 MIL/uL   Hemoglobin 12.5 12.0 - 15.0 g/dL   HCT 39.5 36.0 - 46.0 %   MCV 95.4 80.0 - 100.0 fL   MCH 30.2 26.0 - 34.0 pg   MCHC 31.6 30.0 - 36.0 g/dL   RDW 13.8 11.5 - 15.5 %   Platelets 120 (L) 150 - 400 K/uL   nRBC 0.0 0.0 - 0.2 %    Comment: Performed at [facility], [place] [address] [place], [place] [address]  Basic metabolic panel     Status: Abnormal   Collection Time: 12/31/17  3:14 AM  Result Value Ref Range   Sodium 140 135 - 145 mmol/L   Potassium 3.4 (L) 3.5 - 5.1 mmol/L   Chloride 108 98 - 111 mmol/L   CO2 22 22 - 32 mmol/L   Glucose, Bld 258 (H) 70 - 99 mg/dL   BUN 34 (H) 8 - 23 mg/dL   Creatinine, Ser 1.49 (H) 0.44 - 1.00 mg/dL   Calcium 7.8 (L) 8.9 - 10.3 mg/dL   GFR calc non Af Amer 34 (L) >60 mL/min   GFR calc Af Amer 40 (L) >60 mL/min   Anion gap 10 5 - 15    Comment: Performed at [facility], [place] [address] [place], [place] [address]  Magnesium     Status: Abnormal   Collection Time: 12/31/17  3:14 AM  Result Value Ref Range   Magnesium 1.5 (L) 1.7 - 2.4 mg/dL    Comment: Performed at [facility],  [place] [address], [place], [place] [address]  Hemoglobin A1c     Status: Abnormal   Collection Time: 12/31/17  3:14 AM  Result Value Ref Range   Hgb A1c MFr Bld 7.0 (H) 4.8 - 5.6 %  Comment: (NOTE) Pre diabetes:          5.7%-6.4% Diabetes:              >6.4% Glycemic control for   <7.0% adults with diabetes    Mean Plasma Glucose 154.2 mg/dL    Comment: Performed at [place] [address], [place], [place] [address]  Glucose, capillary     Status: Abnormal   Collection Time: 12/31/17  4:37 AM  Result Value Ref Range   Glucose-Capillary 236 (H) 70 - 99 mg/dL   Comment 1 Notify RN    Comment 2 Document in Chart   Lactic acid, plasma     Status: Abnormal   Collection Time: 12/31/17  7:26 AM  Result Value Ref Range   Lactic Acid, Venous 2.8 (HH) 0.5 - 1.9 mmol/L    Comment: CRITICAL RESULT CALLED TO, READ BACK BY AND VERIFIED WITHShon Hale RN AT [phone] 12/31/17 MULLINS,T Performed at [facility], [place] [address], [place], [place] [address]   Troponin I - Once     Status: Abnormal   Collection Time: 12/31/17  7:26 AM  Result Value Ref Range   Troponin I 1.11 (HH) <0.03 ng/mL    Comment: CRITICAL RESULT CALLED TO, READ BACK BY AND VERIFIED WITHShon Hale RN AT [phone] 12/31/17 MULLINS,T Performed at [facility], [place] [address], [place], [place] [address]   TSH     Status: None   Collection Time: 12/31/17  7:26 AM  Result Value Ref Range   TSH 1.696 0.350 - 4.500 uIU/mL    Comment: Performed by a 3rd Generation assay with a functional sensitivity of <=0.01 uIU/mL. Performed at [facility], [place] [address] [place], [place] [address]   Glucose, capillary     Status: Abnormal   Collection Time: 12/31/17  8:23 AM  Result Value Ref Range   Glucose-Capillary 260 (H) 70 - 99 mg/dL   Comment 1 Notify RN    Comment 2 Document in Chart   Lactic acid, plasma     Status: Abnormal   Collection Time: 12/31/17 10:01  AM  Result Value Ref Range   Lactic Acid, Venous 2.3 (HH) 0.5 - 1.9 mmol/L    Comment: CRITICAL RESULT CALLED TO, READ BACK BY AND VERIFIED WITH: ROMINES,H @ [phone] ON [phone] BY POTEAT,S Performed at [place] [address], [place], [place] [address]   Troponin I - Now Then Q6H     Status: Abnormal   Collection Time: 12/31/17 10:01 AM  Result Value Ref Range   Troponin I 1.09 (HH) <0.03 ng/mL    Comment: CRITICAL VALUE NOTED.  VALUE IS CONSISTENT WITH PREVIOUSLY REPORTED AND CALLED VALUE. Performed at [facility], [place] [address] [place], [place] [address]   Blood gas, arterial     Status: Abnormal   Collection Time: 12/31/17 10:31 AM  Result Value Ref Range   O2 Content 2.0 L/min   Delivery systems NASAL CANNULA    pH, Arterial 7.412 7.350 - 7.450   pCO2 arterial 38.1 32.0 - 48.0 mmHg   pO2, Arterial 65.8 (L) 83.0 - 108.0 mmHg   Bicarbonate 23.6 20.0 - 28.0 mmol/L   Acid-base deficit 0.1 0.0 - 2.0 mmol/L   O2 Saturation 92.3 %   Patient temperature 99.8    Collection site RIGHT RADIAL    Drawn by [id number]    Sample type ARTERIAL DRAW    Allens test (pass/fail) PASS PASS  Comment: Performed at [facility], [place] [address], [place], [place] [address]  Glucose, capillary     Status: Abnormal   Collection Time: 12/31/17 11:39 AM  Result Value Ref Range   Glucose-Capillary 227 (H) 70 - 99 mg/dL  ECHOCARDIOGRAM COMPLETE     Status: None   Collection Time: 12/31/17  3:27 PM  Result Value Ref Range   Weight 4,464 oz   Height 61 in   BP 128/44 mmHg  Troponin I - Now Then Q6H     Status: Abnormal   Collection Time: 12/31/17  3:56 PM  Result Value Ref Range   Troponin I 0.75 (HH) <0.03 ng/mL    Comment: CRITICAL VALUE NOTED.  VALUE IS CONSISTENT WITH PREVIOUSLY REPORTED AND CALLED VALUE. Performed at [facility], [place] [address], [place], [place] [address]   Glucose, capillary     Status: Abnormal    Collection Time: 12/31/17  5:02 PM  Result Value Ref Range   Glucose-Capillary 168 (H) 70 - 99 mg/dL   Comment 1 Notify RN    Comment 2 Document in Chart   Glucose, capillary     Status: Abnormal   Collection Time: 12/31/17  9:02 PM  Result Value Ref Range   Glucose-Capillary 172 (H) 70 - 99 mg/dL   Comment 1 Notify RN    Comment 2 Document in Chart   Troponin I - Now Then Q6H     Status: Abnormal   Collection Time: 12/31/17  9:06 PM  Result Value Ref Range   Troponin I 0.75 (HH) <0.03 ng/mL    Comment: CRITICAL VALUE NOTED.  VALUE IS CONSISTENT WITH PREVIOUSLY REPORTED AND CALLED VALUE. Performed at [facility], [place] [address], [place], [place] [address]   CBC     Status: Abnormal   Collection Time: 01/01/18  3:28 AM  Result Value Ref Range   WBC 11.7 (H) 4.0 - 10.5 K/uL   RBC 4.40 3.87 - 5.11 MIL/uL   Hemoglobin 13.2 12.0 - 15.0 g/dL   HCT 41.9 36.0 - 46.0 %   MCV 95.2 80.0 - 100.0 fL   MCH 30.0 26.0 - 34.0 pg   MCHC 31.5 30.0 - 36.0 g/dL   RDW 13.9 11.5 - 15.5 %   Platelets 126 (L) 150 - 400 K/uL   nRBC 0.0 0.0 - 0.2 %    Comment: Performed at [facility], [place] [address] [place], [place] [address]  Basic metabolic panel     Status: Abnormal   Collection Time: 01/01/18  3:28 AM  Result Value Ref Range   Sodium 139 135 - 145 mmol/L   Potassium 3.9 3.5 - 5.1 mmol/L   Chloride 110 98 - 111 mmol/L   CO2 18 (L) 22 - 32 mmol/L   Glucose, Bld 190 (H) 70 - 99 mg/dL   BUN 43 (H) 8 - 23 mg/dL   Creatinine, Ser 1.63 (H) 0.44 - 1.00 mg/dL   Calcium 8.2 (L) 8.9 - 10.3 mg/dL   GFR calc non Af Amer 31 (L) >60 mL/min   GFR calc Af Amer 36 (L) >60 mL/min   Anion gap 11 5 - 15    Comment: Performed at [facility], [place] [address] [place],  [address]  Magnesium     Status: None   Collection Time: 01/01/18  3:28 AM  Result Value Ref Range   Magnesium 2.3 1.7 - 2.4 mg/dL    Comment: Performed at [facility]  [place] [address] [place], [place] [address]  Glucose, capillary     Status: Abnormal   Collection Time: 01/01/18  7:34 AM  Result Value Ref Range   Glucose-Capillary 177 (H) 70 - 99 mg/dL   Comment 1 Notify RN    Comment 2 Document in Chart   Glucose, capillary     Status: Abnormal   Collection Time: 01/01/18 12:04 PM  Result Value Ref Range   Glucose-Capillary 190 (H) 70 - 99 mg/dL   Comment 1 Notify RN    Comment 2 Document in Chart   Glucose, capillary     Status: Abnormal   Collection Time: 01/01/18  4:06 PM  Result Value Ref Range   Glucose-Capillary 155 (H) 70 - 99 mg/dL   Comment 1 Notify RN    Comment 2 Document in Chart   Urinalysis, Routine w reflex microscopic     Status: Abnormal   Collection Time: 01/01/18  4:15 PM  Result Value Ref Range   Color, Urine AMBER (A) YELLOW    Comment: BIOCHEMICALS MAY BE AFFECTED BY COLOR   APPearance CLEAR CLEAR   Specific Gravity, Urine 1.032 (H) 1.005 - 1.030   pH 5.0 5.0 - 8.0   Glucose, UA NEGATIVE NEGATIVE mg/dL   Hgb urine dipstick NEGATIVE NEGATIVE   Bilirubin Urine NEGATIVE NEGATIVE   Ketones, ur NEGATIVE NEGATIVE mg/dL   Protein, ur 30 (A) NEGATIVE mg/dL   Nitrite NEGATIVE NEGATIVE   Leukocytes, UA NEGATIVE NEGATIVE   RBC / HPF 0-5 0 - 5 RBC/hpf   WBC, UA 0-5 0 - 5 WBC/hpf   Bacteria, UA RARE (A) NONE SEEN   Squamous Epithelial / LPF 0-5 0 - 5   Amorphous Crystal PRESENT     Comment: Performed at [facility], [place] [address], [place], [place] [address]  Culture, Urine     Status: None   Collection Time: 01/01/18  4:15 PM  Result Value Ref Range   Specimen Description      URINE, RANDOM Performed at [facility], [place] [address] [place], [place] [address]    Special Requests      NONE Performed at [facility], [place] [address], [place], [place] [address]    Culture      NO GROWTH Performed at [facility], [place] [address],  [place], [place] [address]    Report Status 01/02/2018 FINAL   Na and K (sodium & potassium), rand urine     Status: None   Collection Time: 01/01/18  4:15 PM  Result Value Ref Range   Sodium, Ur <10 mmol/L    Comment: REPEATED TO VERIFY   Potassium Urine 64 mmol/L    Comment: Performed at [facility], [place] [address] [place], [place] [address]  Creatinine, urine, random     Status: None   Collection Time: 01/01/18  4:15 PM  Result Value Ref Range   Creatinine, Urine 257.54 mg/dL    Comment: Performed at [facility], [place] [address] [place], [place] [address]  Osmolality, urine     Status: None   Collection Time: 01/01/18  4:15 PM  Result Value Ref Range   Osmolality, Ur 830 300 - 900 mOsm/kg    Comment: Performed at [place] [address], [place], [place] [address]  Basic metabolic panel     Status: Abnormal   Collection Time: 01/01/18  4:30 PM  Result Value  Ref Range   Sodium 140 135 - 145 mmol/L   Potassium 4.2 3.5 - 5.1 mmol/L   Chloride 108 98 - 111 mmol/L   CO2 21 (L) 22 - 32 mmol/L   Glucose, Bld 160 (H) 70 - 99 mg/dL   BUN 43 (H) 8 - 23 mg/dL   Creatinine, Ser 1.52 (H) 0.44 - 1.00 mg/dL   Calcium 8.1 (L) 8.9 - 10.3 mg/dL   GFR calc non Af Amer 33 (L) >60 mL/min   GFR calc Af Amer 39 (L) >60 mL/min   Anion gap 11 5 - 15    Comment: Performed at [facility], [place] [address], [place], [place] [address]  Glucose, capillary     Status: Abnormal   Collection Time: 01/01/18  9:45 PM  Result Value Ref Range   Glucose-Capillary 147 (H) 70 - 99 mg/dL  Magnesium     Status: None   Collection Time: 01/02/18  3:20 AM  Result Value Ref Range   Magnesium 2.3 1.7 - 2.4 mg/dL    Comment: Performed at [facility], [place] [address], [place], [place] [address]  CBC with Differential/Platelet     Status: Abnormal   Collection Time: 01/02/18  3:20 AM  Result Value Ref Range   WBC 12.5 (H) 4.0 - 10.5  K/uL   RBC 4.10 3.87 - 5.11 MIL/uL   Hemoglobin 12.2 12.0 - 15.0 g/dL   HCT 39.8 36.0 - 46.0 %   MCV 97.1 80.0 - 100.0 fL   MCH 29.8 26.0 - 34.0 pg   MCHC 30.7 30.0 - 36.0 g/dL   RDW 14.0 11.5 - 15.5 %   Platelets 141 (L) 150 - 400 K/uL   nRBC 0.0 0.0 - 0.2 %   Neutrophils Relative % 90 %   Neutro Abs 11.2 (H) 1.7 - 7.7 K/uL   Lymphocytes Relative 5 %   Lymphs Abs 0.6 (L) 0.7 - 4.0 K/uL   Monocytes Relative 4 %   Monocytes Absolute 0.5 0.1 - 1.0 K/uL   Eosinophils Relative 0 %   Eosinophils Absolute 0.0 0.0 - 0.5 K/uL   Basophils Relative 0 %   Basophils Absolute 0.0 0.0 - 0.1 K/uL   Immature Granulocytes 1 %   Abs Immature Granulocytes 0.09 (H) 0.00 - 0.07 K/uL    Comment: Performed at [facility], [place] [address] [place], [place] [address]  Comprehensive metabolic panel     Status: Abnormal   Collection Time: 01/02/18  3:20 AM  Result Value Ref Range   Sodium 139 135 - 145 mmol/L   Potassium 4.4 3.5 - 5.1 mmol/L   Chloride 110 98 - 111 mmol/L   CO2 22 22 - 32 mmol/L   Glucose, Bld 183 (H) 70 - 99 mg/dL   BUN 40 (H) 8 - 23 mg/dL   Creatinine, Ser 1.28 (H) 0.44 - 1.00 mg/dL   Calcium 8.0 (L) 8.9 - 10.3 mg/dL   Total Protein 6.3 (L) 6.5 - 8.1 g/dL   Albumin 2.8 (L) 3.5 - 5.0 g/dL   AST 12 (L) 15 - 41 U/L   ALT 13 0 - 44 U/L   Alkaline Phosphatase 47 38 - 126 U/L   Total Bilirubin 0.7 0.3 - 1.2 mg/dL   GFR calc non Af Amer 41 (L) >60 mL/min   GFR calc Af Amer 48 (L) >60 mL/min   Anion gap 7 5 - 15    Comment: Performed at [facility],  [place] [address], [place], [place] [address]  Phosphorus     Status: Abnormal   Collection Time: 01/02/18  3:20 AM  Result Value Ref Range   Phosphorus 2.4 (L) 2.5 - 4.6 mg/dL    Comment: Performed at [facility], [place] [address] [place], [place] [address]  Glucose, capillary     Status: Abnormal   Collection Time: 01/02/18  7:32 AM  Result Value Ref Range   Glucose-Capillary 178  (H) 70 - 99 mg/dL   Comment 1 Notify RN    Comment 2 Document in Chart   Glucose, capillary     Status: Abnormal   Collection Time: 01/02/18 11:29 AM  Result Value Ref Range   Glucose-Capillary 164 (H) 70 - 99 mg/dL  TSH     Status: Abnormal   Collection Time: 01/02/18 12:55 PM  Result Value Ref Range   TSH 4.906 (H) 0.350 - 4.500 uIU/mL    Comment: Performed by a 3rd Generation assay with a functional sensitivity of <=0.01 uIU/mL. Performed at [facility], [place] [address], [place], [place] [address]   Glucose, capillary     Status: Abnormal   Collection Time: 01/02/18  3:46 PM  Result Value Ref Range   Glucose-Capillary 140 (H) 70 - 99 mg/dL   Comment 1 Notify RN    Comment 2 Document in Chart   Glucose, capillary     Status: Abnormal   Collection Time: 01/02/18  9:06 PM  Result Value Ref Range   Glucose-Capillary 148 (H) 70 - 99 mg/dL  Magnesium     Status: None   Collection Time: 01/03/18  3:37 AM  Result Value Ref Range   Magnesium 2.2 1.7 - 2.4 mg/dL    Comment: Performed at [facility], [place] [address], [place], [place] [address]  CBC with Differential/Platelet     Status: Abnormal   Collection Time: 01/03/18  3:37 AM  Result Value Ref Range   WBC 9.9 4.0 - 10.5 K/uL   RBC 4.15 3.87 - 5.11 MIL/uL   Hemoglobin 12.0 12.0 - 15.0 g/dL   HCT 38.9 36.0 - 46.0 %   MCV 93.7 80.0 - 100.0 fL   MCH 28.9 26.0 - 34.0 pg   MCHC 30.8 30.0 - 36.0 g/dL   RDW 13.8 11.5 - 15.5 %   Platelets 172 150 - 400 K/uL   nRBC 0.0 0.0 - 0.2 %   Neutrophils Relative % 79 %   Neutro Abs 7.9 (H) 1.7 - 7.7 K/uL   Lymphocytes Relative 12 %   Lymphs Abs 1.2 0.7 - 4.0 K/uL   Monocytes Relative 6 %   Monocytes Absolute 0.6 0.1 - 1.0 K/uL   Eosinophils Relative 2 %   Eosinophils Absolute 0.2 0.0 - 0.5 K/uL   Basophils Relative 0 %   Basophils Absolute 0.0 0.0 - 0.1 K/uL   Immature Granulocytes 1 %   Abs Immature Granulocytes 0.12 (H) 0.00 - 0.07 K/uL     Comment: Performed at [facility], [place] [address] [place], [place] [address]  Comprehensive metabolic panel     Status: Abnormal   Collection Time: 01/03/18  3:37 AM  Result Value Ref Range   Sodium 142 135 - 145 mmol/L   Potassium 3.9 3.5 - 5.1 mmol/L   Chloride 109 98 - 111 mmol/L   CO2 24 22 - 32 mmol/L   Glucose, Bld 155 (H) 70 - 99 mg/dL   BUN 32 (H) 8 -  23 mg/dL   Creatinine, Ser 1.16 (H) 0.44 - 1.00 mg/dL   Calcium 8.4 (L) 8.9 - 10.3 mg/dL   Total Protein 5.9 (L) 6.5 - 8.1 g/dL   Albumin 2.6 (L) 3.5 - 5.0 g/dL   AST 15 15 - 41 U/L   ALT 15 0 - 44 U/L   Alkaline Phosphatase 51 38 - 126 U/L   Total Bilirubin 1.2 0.3 - 1.2 mg/dL   GFR calc non Af Amer 46 (L) >60 mL/min   GFR calc Af Amer 54 (L) >60 mL/min   Anion gap 9 5 - 15    Comment: Performed at [facility], [place] [address], [place], [place] [address]  Phosphorus     Status: None   Collection Time: 01/03/18  3:37 AM  Result Value Ref Range   Phosphorus 3.0 2.5 - 4.6 mg/dL    Comment: Performed at [facility], [place] [address] [place], [place] [address]  Glucose, capillary     Status: Abnormal   Collection Time: 01/03/18  7:36 AM  Result Value Ref Range   Glucose-Capillary 172 (H) 70 - 99 mg/dL   Comment 1 Notify RN    Comment 2 Document in Chart   Glucose, capillary     Status: Abnormal   Collection Time: 01/03/18 11:45 AM  Result Value Ref Range   Glucose-Capillary 206 (H) 70 - 99 mg/dL   Comment 1 Notify RN    Comment 2 Document in Chart   Glucose, capillary     Status: Abnormal   Collection Time: 01/03/18  4:24 PM  Result Value Ref Range   Glucose-Capillary 164 (H) 70 - 99 mg/dL   Comment 1 Notify RN    Comment 2 Document in Chart   Glucose, capillary     Status: Abnormal   Collection Time: 01/03/18  9:48 PM  Result Value Ref Range   Glucose-Capillary 173 (H) 70 - 99 mg/dL  CBC with Differential/Platelet     Status: Abnormal   Collection Time:  01/04/18  3:27 AM  Result Value Ref Range   WBC 9.0 4.0 - 10.5 K/uL   RBC 3.96 3.87 - 5.11 MIL/uL   Hemoglobin 12.0 12.0 - 15.0 g/dL   HCT 37.6 36.0 - 46.0 %   MCV 94.9 80.0 - 100.0 fL   MCH 30.3 26.0 - 34.0 pg   MCHC 31.9 30.0 - 36.0 g/dL   RDW 13.9 11.5 - 15.5 %   Platelets 182 150 - 400 K/uL   nRBC 0.0 0.0 - 0.2 %   Neutrophils Relative % 75 %   Neutro Abs 6.8 1.7 - 7.7 K/uL   Lymphocytes Relative 13 %   Lymphs Abs 1.1 0.7 - 4.0 K/uL   Monocytes Relative 8 %   Monocytes Absolute 0.8 0.1 - 1.0 K/uL   Eosinophils Relative 2 %   Eosinophils Absolute 0.2 0.0 - 0.5 K/uL   Basophils Relative 0 %   Basophils Absolute 0.0 0.0 - 0.1 K/uL   Immature Granulocytes 2 %   Abs Immature Granulocytes 0.22 (H) 0.00 - 0.07 K/uL    Comment: Performed at [facility], [place] [address] [place], [place] [address]  Comprehensive metabolic panel     Status: Abnormal   Collection Time: 01/04/18  3:27 AM  Result Value Ref Range   Sodium 139 135 - 145 mmol/L   Potassium 3.7 3.5 - 5.1 mmol/L   Chloride 104 98 - 111 mmol/L   CO2 25 22 -  32 mmol/L   Glucose, Bld 187 (H) 70 - 99 mg/dL   BUN 28 (H) 8 - 23 mg/dL   Creatinine, Ser 1.04 (H) 0.44 - 1.00 mg/dL   Calcium 8.6 (L) 8.9 - 10.3 mg/dL   Total Protein 5.8 (L) 6.5 - 8.1 g/dL   Albumin 2.6 (L) 3.5 - 5.0 g/dL   AST 22 15 - 41 U/L   ALT 22 0 - 44 U/L   Alkaline Phosphatase 51 38 - 126 U/L   Total Bilirubin 1.0 0.3 - 1.2 mg/dL   GFR calc non Af Amer 53 (L) >60 mL/min   GFR calc Af Amer >60 >60 mL/min   Anion gap 10 5 - 15    Comment: Performed at [facility], [place] [address] [place], [place] [address]  Magnesium     Status: None   Collection Time: 01/04/18  3:27 AM  Result Value Ref Range   Magnesium 2.0 1.7 - 2.4 mg/dL    Comment: Performed at [facility], [place] [address], [place], [place] [address]  Phosphorus     Status: None   Collection Time: 01/04/18  3:27 AM  Result Value Ref Range    Phosphorus 3.4 2.5 - 4.6 mg/dL    Comment: Performed at [facility], [place] [address], [place], [place] [address]  T4, free     Status: Abnormal   Collection Time: 01/04/18  3:27 AM  Result Value Ref Range   Free T4 0.73 (L) 0.82 - 1.77 ng/dL    Comment: (NOTE) Biotin ingestion may interfere with free T4 tests. If the results are inconsistent with the TSH level, previous test results, or the clinical presentation, then consider biotin interference. If needed, order repeat testing after stopping biotin. Performed at [facility], [place] [address], [place], [place] [id number]   T3     Status: Abnormal   Collection Time: 01/04/18  3:27 AM  Result Value Ref Range   T3, Total 69 (L) 71 - 180 ng/dL    Comment: (NOTE) Performed At: [facility] [place], [place] [id number] Rush Farmer MD [id number]   Prealbumin     Status: Abnormal   Collection Time: 01/04/18  3:27 AM  Result Value Ref Range   Prealbumin 9.9 (L) 18 - 38 mg/dL    Comment: Performed at [facility], [place] [address] [place], [place] [address]  Glucose, capillary     Status: Abnormal   Collection Time: 01/04/18  7:55 AM  Result Value Ref Range   Glucose-Capillary 194 (H) 70 - 99 mg/dL  Glucose, capillary     Status: Abnormal   Collection Time: 01/04/18 11:48 AM  Result Value Ref Range   Glucose-Capillary 199 (H) 70 - 99 mg/dL  Glucose, capillary     Status: Abnormal   Collection Time: 01/04/18  4:17 PM  Result Value Ref Range   Glucose-Capillary 188 (H) 70 - 99 mg/dL   Comment 1 Notify RN    Comment 2 Document in Chart   Glucose, capillary     Status: Abnormal   Collection Time: 01/04/18 10:49 PM  Result Value Ref Range   Glucose-Capillary 189 (H) 70 - 99 mg/dL  Basic metabolic panel     Status: Abnormal   Collection Time: 01/05/18  4:41 AM  Result Value Ref Range   Sodium 136 135 - 145 mmol/L   Potassium 3.2 (L) 3.5 - 5.1 mmol/L   Chloride 102 98 -  111  mmol/L   CO2 27 22 - 32 mmol/L   Glucose, Bld 185 (H) 70 - 99 mg/dL   BUN 23 8 - 23 mg/dL   Creatinine, Ser 1.05 (H) 0.44 - 1.00 mg/dL   Calcium 8.4 (L) 8.9 - 10.3 mg/dL   GFR calc non Af Amer 52 (L) >60 mL/min   GFR calc Af Amer >60 >60 mL/min   Anion gap 7 5 - 15    Comment: Performed at [facility], [place] [address] [place], [place] [address]  Magnesium     Status: None   Collection Time: 01/05/18  4:41 AM  Result Value Ref Range   Magnesium 1.7 1.7 - 2.4 mg/dL    Comment: Performed at [facility], [place] [address] [place], [place] [address]  CBC     Status: Abnormal   Collection Time: 01/05/18  4:41 AM  Result Value Ref Range   WBC 7.9 4.0 - 10.5 K/uL   RBC 3.66 (L) 3.87 - 5.11 MIL/uL   Hemoglobin 10.9 (L) 12.0 - 15.0 g/dL   HCT 34.2 (L) 36.0 - 46.0 %   MCV 93.4 80.0 - 100.0 fL   MCH 29.8 26.0 - 34.0 pg   MCHC 31.9 30.0 - 36.0 g/dL   RDW 13.8 11.5 - 15.5 %   Platelets 200 150 - 400 K/uL   nRBC 0.0 0.0 - 0.2 %    Comment: Performed at [facility], [place] [address] [place], [place] [address]  Prealbumin     Status: Abnormal   Collection Time: 01/05/18  4:41 AM  Result Value Ref Range   Prealbumin 11.6 (L) 18 - 38 mg/dL    Comment: Performed at [facility], [place] [address], [place], [place] [address]  Glucose, capillary     Status: Abnormal   Collection Time: 01/05/18  7:57 AM  Result Value Ref Range   Glucose-Capillary 137 (H) 70 - 99 mg/dL  Glucose, capillary     Status: Abnormal   Collection Time: 01/05/18 11:45 AM  Result Value Ref Range   Glucose-Capillary 185 (H) 70 - 99 mg/dL  Glucose, capillary     Status: Abnormal   Collection Time: 01/05/18  5:00 PM  Result Value Ref Range   Glucose-Capillary 169 (H) 70 - 99 mg/dL  Glucose, capillary     Status: Abnormal   Collection Time: 01/05/18  9:48 PM  Result Value Ref Range   Glucose-Capillary 182 (H) 70 - 99 mg/dL   Comment 1 Notify RN    Creatinine, serum     Status: None   Collection Time: 01/06/18  5:15 AM  Result Value Ref Range   Creatinine, Ser 0.82 0.44 - 1.00 mg/dL   GFR calc non Af Amer >60 >60 mL/min   GFR calc Af Amer >60 >60 mL/min    Comment: Performed at [facility], [place] [address], [place], [place] [address]  Glucose, capillary     Status: Abnormal   Collection Time: 01/06/18  7:35 AM  Result Value Ref Range   Glucose-Capillary 156 (H) 70 - 99 mg/dL  Basic metabolic panel     Status: Abnormal   Collection Time: 01/06/18  9:35 AM  Result Value Ref Range   Sodium 138 135 - 145 mmol/L   Potassium 4.3 3.5 - 5.1 mmol/L    Comment: DELTA CHECK NOTED REPEATED TO VERIFY NO VISIBLE HEMOLYSIS    Chloride 106 98 - 111 mmol/L   CO2  25 22 - 32 mmol/L   Glucose, Bld 161 (H) 70 - 99 mg/dL   BUN 15 8 - 23 mg/dL   Creatinine, Ser 0.83 0.44 - 1.00 mg/dL   Calcium 8.5 (L) 8.9 - 10.3 mg/dL   GFR calc non Af Amer >60 >60 mL/min   GFR calc Af Amer >60 >60 mL/min   Anion gap 7 5 - 15    Comment: Performed at [facility], [place] [address] [place], [place] [address]  Magnesium     Status: None   Collection Time: 01/06/18  9:35 AM  Result Value Ref Range   Magnesium 2.0 1.7 - 2.4 mg/dL    Comment: Performed at [facility], [place] [address], [place], [place] [address]  CBC     Status: Abnormal   Collection Time: 01/06/18  9:35 AM  Result Value Ref Range   WBC 8.1 4.0 - 10.5 K/uL   RBC 3.64 (L) 3.87 - 5.11 MIL/uL   Hemoglobin 10.7 (L) 12.0 - 15.0 g/dL   HCT 34.0 (L) 36.0 - 46.0 %   MCV 93.4 80.0 - 100.0 fL   MCH 29.4 26.0 - 34.0 pg   MCHC 31.5 30.0 - 36.0 g/dL   RDW 13.9 11.5 - 15.5 %   Platelets 236 150 - 400 K/uL   nRBC 0.0 0.0 - 0.2 %    Comment: Performed at [facility], [place] [address] [place], [place] [address]  Glucose, capillary     Status: Abnormal   Collection Time: 01/06/18 12:47 PM  Result Value Ref Range    Glucose-Capillary 153 (H) 70 - 99 mg/dL  Glucose, capillary     Status: Abnormal   Collection Time: 01/06/18  4:38 PM  Result Value Ref Range   Glucose-Capillary 167 (H) 70 - 99 mg/dL  Glucose, capillary     Status: Abnormal   Collection Time: 01/06/18 10:11 PM  Result Value Ref Range   Glucose-Capillary 139 (H) 70 - 99 mg/dL  Glucose, capillary     Status: Abnormal   Collection Time: 01/07/18  8:07 AM  Result Value Ref Range   Glucose-Capillary 142 (H) 70 - 99 mg/dL  C difficile quick scan w PCR reflex     Status: Abnormal   Collection Time: 01/07/18 11:35 AM  Result Value Ref Range   C Diff antigen POSITIVE (A) NEGATIVE   C Diff toxin POSITIVE (A) NEGATIVE   C Diff interpretation Toxin producing C. difficile detected.     Comment: CRITICAL RESULT CALLED TO, READ BACK BY AND VERIFIED WITH: FARGO,A @ [id number] ON [id number] BY POTEAT,S Performed at [place] [address] [place], [place] [address]   Glucose, capillary     Status: Abnormal   Collection Time: 01/07/18 12:18 PM  Result Value Ref Range   Glucose-Capillary 125 (H) 70 - 99 mg/dL  Glucose, capillary     Status: Abnormal   Collection Time: 01/07/18  5:05 PM  Result Value Ref Range   Glucose-Capillary 157 (H) 70 - 99 mg/dL  Glucose, capillary     Status: Abnormal   Collection Time: 01/07/18  8:56 PM  Result Value Ref Range   Glucose-Capillary 136 (H) 70 - 99 mg/dL  Glucose, capillary     Status: Abnormal   Collection Time: 01/08/18  7:52 AM  Result Value Ref Range   Glucose-Capillary 122 (H) 70 - 99 mg/dL  CBC     Status: Abnormal   Collection Time: 01/08/18  9:06 AM  Result Value Ref Range   WBC 7.8 4.0 - 10.5 K/uL   RBC 3.62 (L) 3.87 - 5.11 MIL/uL   Hemoglobin 10.6 (L) 12.0 - 15.0 g/dL   HCT 34.2 (L) 36.0 - 46.0 %   MCV 94.5 80.0 - 100.0 fL   MCH 29.3 26.0 - 34.0 pg   MCHC 31.0 30.0 - 36.0 g/dL   RDW 13.8 11.5 - 15.5 %   Platelets 250 150 - 400 K/uL   nRBC 0.0 0.0 - 0.2 %    Comment:  Performed at [facility], [place] [address], [place], [place] [address]  Glucose, capillary     Status: Abnormal   Collection Time: 01/08/18  9:58 AM  Result Value Ref Range   Glucose-Capillary 122 (H) 70 - 99 mg/dL  Glucose, capillary     Status: Abnormal   Collection Time: 01/08/18 12:02 PM  Result Value Ref Range   Glucose-Capillary 161 (H) 70 - 99 mg/dL  Glucose, capillary     Status: Abnormal   Collection Time: 01/08/18  4:59 PM  Result Value Ref Range   Glucose-Capillary 129 (H) 70 - 99 mg/dL  Glucose, capillary     Status: Abnormal   Collection Time: 01/08/18  9:50 PM  Result Value Ref Range   Glucose-Capillary 105 (H) 70 - 99 mg/dL  Glucose, capillary     Status: Abnormal   Collection Time: 01/09/18  8:17 AM  Result Value Ref Range   Glucose-Capillary 110 (H) 70 - 99 mg/dL  Glucose, capillary     Status: Abnormal   Collection Time: 01/09/18 12:22 PM  Result Value Ref Range   Glucose-Capillary 145 (H) 70 - 99 mg/dL  Glucose, capillary     Status: Abnormal   Collection Time: 01/09/18  4:54 PM  Result Value Ref Range   Glucose-Capillary 131 (H) 70 - 99 mg/dL  Glucose, capillary     Status: Abnormal   Collection Time: 01/09/18 10:09 PM  Result Value Ref Range   Glucose-Capillary 119 (H) 70 - 99 mg/dL  Glucose, capillary     Status: Abnormal   Collection Time: 01/10/18  8:02 AM  Result Value Ref Range   Glucose-Capillary 124 (H) 70 - 99 mg/dL  Glucose, capillary     Status: Abnormal   Collection Time: 01/10/18 12:18 PM  Result Value Ref Range   Glucose-Capillary 135 (H) 70 - 99 mg/dL  Myocardial Perfusion Imaging     Status: None   Collection Time: 03/23/18 11:12 AM  Result Value Ref Range   Rest HR 73 bpm   Rest BP 156/60 mmHg   Peak HR 85 bpm   Peak BP 141/47 mmHg   SSS 3    SRS 0    SDS 3    TID 0.97    LV sys vol 34 mL   LV dias vol 91 46 - 106 mL  Hemoglobin A1c     Status: Abnormal   Collection Time: 03/29/18  8:43 AM  Result Value Ref  Range   Hgb A1c MFr Bld 6.7 (H) 4.6 - 6.5 %    Comment: Glycemic Control Guidelines for People with Diabetes:Non Diabetic:  <6%Goal of Therapy: <7%Additional Action Suggested:  >8%   Lipid panel     Status: None   Collection Time: 03/29/18  8:43 AM  Result Value Ref Range   Cholesterol 151 0 - 200 mg/dL    Comment: ATP III Classification       Desirable:  <  200 mg/dL               Borderline High:  200 - 239 mg/dL          High:  > = 240 mg/dL   Triglycerides 132.0 0.0 - 149.0 mg/dL    Comment: Normal:  <150 mg/dLBorderline High:  150 - 199 mg/dL   HDL 55.40 >39.00 mg/dL   VLDL 26.4 0.0 - 40.0 mg/dL   LDL Cholesterol 69 0 - 99 mg/dL   Total CHOL/HDL Ratio 3     Comment:                Men          Women1/2 Average Risk     3.4          3.3Average Risk          5.0          4.42X Average Risk          9.6          7.13X Average Risk          15.0          11.0                       NonHDL 95.72     Comment: NOTE:  Non-HDL goal should be 30 mg/dL higher than patient's LDL goal (i.e. LDL goal of < 70 mg/dL, would have non-HDL goal of < 100 mg/dL)  CBC with Differential/Platelet     Status: None   Collection Time: 03/29/18  8:43 AM  Result Value Ref Range   WBC 5.2 4.0 - 10.5 K/uL   RBC 4.07 3.87 - 5.11 Mil/uL   Hemoglobin 12.1 12.0 - 15.0 g/dL   HCT 36.0 36.0 - 46.0 %   MCV 88.6 78.0 - 100.0 fl   MCHC 33.6 30.0 - 36.0 g/dL   RDW 14.5 11.5 - 15.5 %   Platelets 156.0 150.0 - 400.0 K/uL   Neutrophils Relative % 45.5 43.0 - 77.0 %   Lymphocytes Relative 42.9 12.0 - 46.0 %   Monocytes Relative 8.2 3.0 - 12.0 %   Eosinophils Relative 2.9 0.0 - 5.0 %   Basophils Relative 0.5 0.0 - 3.0 %   Neutro Abs 2.4 1.4 - 7.7 K/uL   Lymphs Abs 2.2 0.7 - 4.0 K/uL   Monocytes Absolute 0.4 0.1 - 1.0 K/uL   Eosinophils Absolute 0.2 0.0 - 0.7 K/uL   Basophils Absolute 0.0 0.0 - 0.1 K/uL  Basic metabolic panel     Status: Abnormal   Collection Time: 03/29/18  8:43 AM  Result Value Ref Range   Sodium 140  135 - 145 mEq/L   Potassium 3.7 3.5 - 5.1 mEq/L   Chloride 105 96 - 112 mEq/L   CO2 25 19 - 32 mEq/L   Glucose, Bld 130 (H) 70 - 99 mg/dL   BUN 20 6 - 23 mg/dL   Creatinine, Ser 0.98 0.40 - 1.20 mg/dL   Calcium 9.0 8.4 - 10.5 mg/dL   GFR 66.95 >60.00 mL/min  TSH     Status: None   Collection Time: 03/29/18  8:43 AM  Result Value Ref Range   TSH 2.77 0.35 - 4.50 uIU/mL    Objective: General: Patient is awake, alert, and oriented x 3 and in no acute distress.  Integument: Skin is warm, dry and supple bilateral. Nails are tender, long, thickened and  dystrophic with subungual  debris, consistent with onychomycosis, 1-5 bilateral. No acute ingrowing on left great toe. No signs of infection. No open lesions or preulcerative lesions present bilateral. Remaining integument unremarkable.  Vasculature:  Dorsalis Pedis pulse 2/4 bilateral. Posterior Tibial pulse  1/4 bilateral.  Capillary fill time <3 sec 1-5 bilateral. Positive hair growth to the level of the digits. Temperature gradient within normal limits. No varicosities present bilateral. No edema present bilateral.   Neurology: The patient has intact sensation measured with a 5.07/10g Semmes Weinstein Monofilament at all pedal sites bilateral . Vibratory sensation diminished bilateral with tuning fork. No Babinski sign present bilateral.   Musculoskeletal: Asymptomatic pes pedal deformities noted bilateral. Muscular strength 5/5 in all lower extremity muscular groups bilateral without pain on range of motion . No tenderness with calf compression bilateral.  Assessment and Plan: Problem List Items Addressed This Visit    None    Visit Diagnoses    Pain due to onychomycosis of nail    -  Primary   Diabetes mellitus without complication ([place])          -Examined patient. -Discussed and educated patient on diabetic foot care, especially with  regards to the vascular, neurological and musculoskeletal systems.  -Stressed the importance  of good glycemic control and the detriment of not  controlling glucose levels in relation to the foot. -Mechanically debrided all nails 1-5 bilateral using sterile nail nipper and filed with dremel without incident  -Answered all patient questions -Patient to return  in 3 months for at risk foot care -Patient advised to call the office if any problems or questions arise in the meantime.  Landis Martins, DPM

## 2018-04-02 NOTE — Progress Notes (Signed)
Chief Complaint  Patient presents with  . Annual Exam    Pt states that her left thigh is still not "working right" pt feels like its numb or tingly with pain   . Medication Management  . Hypothyroidism  . Hypertension  . Diabetes    HPI: Rebecca Rich 73 y.o. comes in today for yearly visist exam/  visit . Chronic disease management   DM  Ok but  More sweet tea recently when oes out  And  Had 35 boxes [employer] cookies over the last 3 mos but now no more   BPok  CV neg stress tests per cards and  new meds    Troponin was felt to be demand? Ishcemia? Had svt.  Other   Obesity  Back up plan on  trying to get down again   Had foot exam and  Pod said no concerns  fo r feet and no numbness or lesion  On feet   No eye  Disease noted   Has noted left ant thigh numbness since surgery  No pan or weakness  Not below the knees .  Health Maintenance  Topic Date Due  . DEXA SCAN  04/12/2008  . TETANUS/TDAP  01/27/2013  . OPHTHALMOLOGY EXAM  04/05/2018 (Originally 02/10/2018)  . INFLUENZA VACCINE  04/28/2018 (Originally 08/27/2017)  . MAMMOGRAM  06/03/2018  . HEMOGLOBIN A1C  09/29/2018  . FOOT EXAM  03/30/2019  . COLONOSCOPY  02/18/2027  . Hepatitis C Screening  Completed  . PNA vac Low Risk Adult  Completed   Health Maintenance Review LIFESTYLE:  Exercise:   [facility] 4 x per week  [facility] seminar.  Tobacco/ETS: no Alcohol:    no Sugar beverages:  Tea with sugar.  herba life.  Sleep:  8 hours  Per night  Drug use: no HH:   1 no pets not traveling anymore .  Jan 14 .  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 Last eye check   Safety:  Has smoke detector and wears seat belts.   No excess sun exposure. Sees dentist regularly.  Falls: n  Memory: Felt to be good  , no concern from her or her family.  Depression: No anhedonia unusual crying or depressive  symptoms  Nutrition: Eats well balanced diet; adequate calcium and vitamin D. No swallowing chewing problems.  Injury: no major injuries in the last six months.  Other healthcare providers:  Reviewed today .  Preventive parameters: declines flu vaccine  Reviewed   ADLS:   There are no problems or need for assistance  driving, feeding, obtaining food, dressing, toileting and bathing, managing money using phone. She is independent.   ROS:  GEN/ HEENT: No fever, significant weight changes sweats headaches vision problems hearing changes, CV/ PULM; No chest pain shortness of breath cough, syncope,edema  change in exercise tolerance. GI /GU: No adominal pain, vomiting, change in bowel habits. No blood in the stool. No significant GU symptoms. SKIN/HEME: ,no acute skin rashes suspicious lesions or bleeding. No lymphadenopathy, nodules, masses.  NEURO/ PSYCH:  No neurologic signs such as weakness numbness. No depression anxiety. IMM/ Allergy: No unusual infections.  Allergy .   REST of 12 system review negative except as per HPI   Past Medical History:  Diagnosis Date  . Acute bronchospasm 08/24/2009  . Acute lower  GI bleeding 02/16/2017  . Acute sphenoidal sinusitis 02/26/2010   Qualifier: Diagnosis of  By: Regis Bill MD, Standley Brooking   . CARDIAC MURMUR 12/08/2006  . COLONIC POLYPS, HX OF 12/08/2006  . DIABETES MELLITUS, TYPE II 12/08/2006  . Gallbladder/common duct stone, without infection, with obstruction 03/01/2010   removed ercp  . HYPERLIPIDEMIA 12/08/2006  . HYPERTENSION 12/08/2006  . 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 INFECTION, SKIN AND SOFT TISSUE 07/28/2008  . 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 LIVER FUNCTION TESTS, ABNORMAL 07/28/2008  . Morbid obesity ([place]) 12/08/2006  . OBESITY 09/24/2009  . OBSTRUCTIVE SLEEP APNEA 12/08/2006  . OSTEOARTHRITIS 12/08/2006  . RUQ PAIN 06/16/2008  . SHOULDER PAIN, RIGHT 02/07/2008  . Sleep apnea   . Swelling of limb 07/28/2008  . THYROID FUNCTION TEST, ABNORMAL  12/08/2006    Family History  Problem Relation Age of Onset  . Other Mother        blood clots  . Cervical cancer Mother   . Cancer Mother   . Heart disease Brother   . Diabetes Brother   . Heart disease Sister   . Diabetes Sister   . Heart disease Brother   . Diabetes Brother   . Heart disease Brother   . Diabetes Brother   . Stroke Sister   . Diabetes Sister   . Throat cancer Father   . Cancer Father   . Diabetes Other        all siblings, 4 brothers, 5 sisters    Social History   Socioeconomic History  . Marital status: Divorced    Spouse name: Not on file  . Number of children: Not on file  . Years of education: Not on file  . Highest education level: Not on file  Occupational History  . Occupation: retired    Fish farm manager: RETIRED  Social Needs  . Financial resource strain: Not on file  . Food insecurity:    Worry: Not on file    Inability: Not on file  . Transportation needs:    Medical: Not on file    Non-medical: Not on file  Tobacco Use  . Smoking status: Never Smoker  . Smokeless tobacco: Never Used  Substance and Sexual Activity  . Alcohol use: No    Alcohol/week: 0.0 standard drinks  . Drug use: No  . Sexual activity: Not on file  Lifestyle  . Physical activity:    Days per week: Not on file    Minutes per session: Not on file  . Stress: Not on file  Relationships  . Social connections:    Talks on phone: Not on file    Gets together: Not on file    Attends religious service: Not on file    Active member of club or organization: Not on file    Attends meetings of clubs or organizations: Not on file    Relationship status: Not on file  Other Topics Concern  . Not on file  Social History Narrative   Master level education in math   Pt is currently retired   Pt is divorced with children   Recently had to move had a break in and thus away from her pool exercise    Outpatient Encounter Medications as of 04/05/2018  Medication Sig  .  amLODipine (NORVASC) 5 MG tablet TAKE 1 TABLET BY MOUTH EVERY DAY  . chlorthalidone (HYGROTON) 25 MG tablet TAKE 1 TABLET BY MOUTH EVERY DAY  . cholecalciferol (VITAMIN D) 1000 UNITS tablet Take 1,000 Units by mouth  daily.  . glucose blood (ACCU-CHEK AVIVA PLUS) test strip Use to check blood sugar daily E11.9  . Lancets (ACCU-CHEK SOFT TOUCH) lancets Use to test blood glucose twice daily  . levothyroxine (SYNTHROID, LEVOTHROID) 75 MCG tablet TAKE 1 TABLET (75 MCG TOTAL) BY MOUTH DAILY BEFORE BREAKFAST.  Marland Kitchen losartan (COZAAR) 100 MG tablet Take 1 tablet (100 mg total) by mouth daily. May begin at 50 mg per day and increase to 100 mg  . metoprolol succinate (TOPROL XL) 25 MG 24 hr tablet Take 1 tablet (25 mg total) by mouth daily.  . Misc Natural Products (OSTEO BI-FLEX ADV JOINT SHIELD PO) Take 1 tablet by mouth daily.  . Multiple Vitamins-Minerals (CENTRUM SILVER PO) Take 1 tablet by mouth daily.    . potassium chloride (K-DUR) 10 MEQ tablet Take 1 tablet (10 mEq total) by mouth 3 (three) times daily.   No facility-administered encounter medications on file as of 04/05/2018.     EXAM:  BP 132/64 (BP Location: Right Arm, Patient Position: Sitting, Cuff Size: Large)   Pulse 80   Temp 98.6 F (37 C) (Oral)   Ht 5\' 1"  (1.549 m)   Wt 279 lb 4.8 oz (126.7 kg)   BMI 52.77 kg/m   Body mass index is 52.77 kg/m.  Physical Exam: Vital signs reviewed WJX:BJYN is a well-developed well-nourished alert cooperative   who appears stated age in no acute distress.  HEENT: normocephalic atraumatic , Eyes: PERRL EOM's full, conjunctiva clear, Nares: paten,t no deformity discharge or tenderness., Ears: no deformity EAC's clear TMs with normal landmarks. Mouth: clear OP, no lesions, edema.  Moist mucous membranes. Dentition in adequate repair. NECK: supple without masses, thyromegaly or bruits.  Prominent  scm joint no changee ( see prev note) CHEST/PULM:  Clear to auscultation and percussion breath sounds  equal no wheeze , rales or rhonchi. No chest wall deformities or tenderness.Breast: normal by inspection . No dimpling, discharge, masses, tenderness or discharge . CV: PMI is nondisplaced, S1 S2 no gallops,  Rubs. 2/6 sem usb    Peripheral pulses are present  No JVD .  ABDOMEN: Bowel sounds normal nontender  No guard or rebound, no hepato splenomegal no CVA tenderness.   Extremtities:  No clubbing cyanosis or edema, no acute joint swelling or redness no focal atrophy NEURO:  Oriented x3, cranial nerves 3-12 appear to be intact, no obvious focal weakness,gait within normal limits   Subjective numbn let anterior thigh above the knee  SKIN: No acute rashes normal turgor, color, no bruising or petechiae.  New keloids all over abd from her abd surgery  No lesion pain  Dry skin    Edema changes   PSYCH: Oriented, good eye contact, no obvious depression anxiety, cognition and judgment appear normal. LN: no cervical axillary inguinal adenopathy No noted deficits in memory, attention, and speech.  Lab reveiwed  Lab Results  Component Value Date   WBC 5.2 03/29/2018   HGB 12.1 03/29/2018   HCT 36.0 03/29/2018   PLT 156.0 03/29/2018   GLUCOSE 130 (H) 03/29/2018   CHOL 151 03/29/2018   TRIG 132.0 03/29/2018   HDL 55.40 03/29/2018   LDLCALC 69 03/29/2018   ALT 22 01/04/2018   AST 22 01/04/2018   NA 140 03/29/2018   K 3.7 03/29/2018   CL 105 03/29/2018   CREATININE 0.98 03/29/2018   BUN 20 03/29/2018   CO2 25 03/29/2018   TSH 2.77 03/29/2018   HGBA1C 6.7 (H) 03/29/2018   MICROALBUR 0.4 08/02/2009  ASSESSMENT AND PLAN:  Discussed the following assessment and plan:  Diabetes mellitus type 2 in obese (HCC)  Medication management  Morbid obesity with BMI of 50.0-59.9, adult ([place])  Essential hypertension  OSA on CPAP  Mild aortic stenosis  Hypothyroidism, unspecified type - in range   Influenza vaccination declined by patient - counseled  Numbness and tingling of left  leg Using    [employer] testing   strips To contact us about writing rx for  This refill ( ? Machine not covered by insurance ?)  But will rx  As requested to see if better pricing.  Follow the numbness   Left ant thigh  Sounds peripheral nerve.? Femoral nerve  No pain Doubt diabetes since began after  Hospitalization for   Appendicitis and surgery  Get weight back down Counseled.  Lab results reviewed  Under cards care  No sig obst  Cad   ROV  in 3-4 mos and check a1c at that t ime  Patient Care Team: Burnis Medin, MD as PCP - General Alphonsa Overall, MD (General Surgery) Carol Ada, MD (Gastroenterology) Gean Birchwood, DPM (Podiatry) Adrian Prows, MD as Consulting Physician (Cardiology) Juanita Craver, MD as Consulting Physician (Gastroenterology) Zadie Rhine Clent Demark, MD as Consulting Physician (Ophthalmology)  Patient Instructions   Consider shingrix  Vaccine  At your pharmacy   Track and cut down the sweet tea.  a1c  Can come down with this.   Glad you are doing well .  Let us know if you change your mind about the  Flu vaccine.   Plan ROV in about 3 months  And we can check a1c at that time.     Wt Readings from Last 3 Encounters:  04/05/18 279 lb 4.8 oz (126.7 kg)  03/22/18 267 lb (121.1 kg)  03/08/18 267 lb 12.8 oz (121.5 kg)      Lab Results  Component Value Date   WBC 5.2 03/29/2018   HGB 12.1 03/29/2018   HCT 36.0 03/29/2018   PLT 156.0 03/29/2018   GLUCOSE 130 (H) 03/29/2018   CHOL 151 03/29/2018   TRIG 132.0 03/29/2018   HDL 55.40 03/29/2018   LDLCALC 69 03/29/2018   ALT 22 01/04/2018   AST 22 01/04/2018   NA 140 03/29/2018   K 3.7 03/29/2018   CL 105 03/29/2018   CREATININE 0.98 03/29/2018   BUN 20 03/29/2018   CO2 25 03/29/2018   TSH 2.77 03/29/2018   HGBA1C 6.7 (H) 03/29/2018   MICROALBUR 0.4 08/02/2009     Felicia Both K. Caris Cerveny M.D.

## 2018-04-05 ENCOUNTER — Ambulatory Visit (INDEPENDENT_AMBULATORY_CARE_PROVIDER_SITE_OTHER): Payer: Medicare Other | Admitting: Internal Medicine

## 2018-04-05 ENCOUNTER — Encounter: Payer: Self-pay | Admitting: Internal Medicine

## 2018-04-05 VITALS — BP 132/64 | HR 80 | Temp 98.6°F | Ht 61.0 in | Wt 279.3 lb

## 2018-04-05 DIAGNOSIS — G4733 Obstructive sleep apnea (adult) (pediatric): Secondary | ICD-10-CM | POA: Diagnosis not present

## 2018-04-05 DIAGNOSIS — Z2821 Immunization not carried out because of patient refusal: Secondary | ICD-10-CM | POA: Diagnosis not present

## 2018-04-05 DIAGNOSIS — I350 Nonrheumatic aortic (valve) stenosis: Secondary | ICD-10-CM | POA: Diagnosis not present

## 2018-04-05 DIAGNOSIS — E039 Hypothyroidism, unspecified: Secondary | ICD-10-CM

## 2018-04-05 DIAGNOSIS — I10 Essential (primary) hypertension: Secondary | ICD-10-CM | POA: Diagnosis not present

## 2018-04-05 DIAGNOSIS — Z79899 Other long term (current) drug therapy: Secondary | ICD-10-CM | POA: Diagnosis not present

## 2018-04-05 DIAGNOSIS — R200 Anesthesia of skin: Secondary | ICD-10-CM

## 2018-04-05 DIAGNOSIS — E669 Obesity, unspecified: Secondary | ICD-10-CM

## 2018-04-05 DIAGNOSIS — Z9989 Dependence on other enabling machines and devices: Secondary | ICD-10-CM

## 2018-04-05 DIAGNOSIS — Z6841 Body Mass Index (BMI) 40.0 and over, adult: Secondary | ICD-10-CM | POA: Diagnosis not present

## 2018-04-05 DIAGNOSIS — E1169 Type 2 diabetes mellitus with other specified complication: Secondary | ICD-10-CM

## 2018-04-05 DIAGNOSIS — R202 Paresthesia of skin: Secondary | ICD-10-CM

## 2018-04-05 NOTE — Patient Instructions (Addendum)
Consider shingrix  Vaccine  At your pharmacy   Track and cut down the sweet tea.  a1c  Can come down with this.   Glad you are doing well .  Let us know if you change your mind about the  Flu vaccine.   Plan ROV in about 3 months  And we can check a1c at that time.     Wt Readings from Last 3 Encounters:  04/05/18 279 lb 4.8 oz (126.7 kg)  03/22/18 267 lb (121.1 kg)  03/08/18 267 lb 12.8 oz (121.5 kg)      Lab Results  Component Value Date   WBC 5.2 03/29/2018   HGB 12.1 03/29/2018   HCT 36.0 03/29/2018   PLT 156.0 03/29/2018   GLUCOSE 130 (H) 03/29/2018   CHOL 151 03/29/2018   TRIG 132.0 03/29/2018   HDL 55.40 03/29/2018   LDLCALC 69 03/29/2018   ALT 22 01/04/2018   AST 22 01/04/2018   NA 140 03/29/2018   K 3.7 03/29/2018   CL 105 03/29/2018   CREATININE 0.98 03/29/2018   BUN 20 03/29/2018   CO2 25 03/29/2018   TSH 2.77 03/29/2018   HGBA1C 6.7 (H) 03/29/2018   MICROALBUR 0.4 08/02/2009

## 2018-04-19 ENCOUNTER — Other Ambulatory Visit: Payer: Self-pay | Admitting: Internal Medicine

## 2018-04-23 ENCOUNTER — Other Ambulatory Visit: Payer: Self-pay | Admitting: Internal Medicine

## 2018-04-26 ENCOUNTER — Encounter (HOSPITAL_BASED_OUTPATIENT_CLINIC_OR_DEPARTMENT_OTHER): Payer: Medicare Other

## 2018-04-30 ENCOUNTER — Telehealth: Payer: Self-pay | Admitting: Internal Medicine

## 2018-04-30 NOTE — Telephone Encounter (Signed)
F/U Message           Patient returned call and wants e-visit, send link.

## 2018-05-05 NOTE — Telephone Encounter (Signed)
Will arrange virtual visit with Dr. Harrington Challenger or APP.

## 2018-05-06 NOTE — Telephone Encounter (Signed)
Patient stated she already has a virtual visit with Dr. Harrington Challenger in July. Will forward to Dr. Harrington Challenger' nurse.

## 2018-05-06 NOTE — Telephone Encounter (Signed)
Follow up: ° ° °Patient returning call  ° ° ° °

## 2018-05-10 ENCOUNTER — Ambulatory Visit: Payer: Medicare Other | Admitting: Internal Medicine

## 2018-05-17 ENCOUNTER — Telehealth: Payer: Self-pay | Admitting: *Deleted

## 2018-05-17 NOTE — Telephone Encounter (Signed)
Left detailed message for patient to call back (self-identified VM).  She has a virtual appointment scheduled for July.  Calling to move up to this week or early May.

## 2018-05-20 NOTE — Telephone Encounter (Signed)
Patient returned call, I scheduled her for 5/7 at 11:30 with Dr. Harrington Challenger.

## 2018-05-22 ENCOUNTER — Other Ambulatory Visit: Payer: Self-pay | Admitting: Internal Medicine

## 2018-06-02 ENCOUNTER — Telehealth: Payer: Self-pay

## 2018-06-02 ENCOUNTER — Telehealth: Payer: Self-pay | Admitting: Internal Medicine

## 2018-06-02 NOTE — Telephone Encounter (Signed)
Called and left message for patient to call back, need to get verbal consent for virtual appointment tomorrow with Dr Harrington Challenger.

## 2018-06-02 NOTE — Telephone Encounter (Signed)
New Message   Pt says her computer is down and would like to only have a telephone call for her appt

## 2018-06-03 ENCOUNTER — Telehealth: Payer: Self-pay | Admitting: Internal Medicine

## 2018-06-03 ENCOUNTER — Encounter: Payer: Self-pay | Admitting: Internal Medicine

## 2018-06-03 ENCOUNTER — Other Ambulatory Visit: Payer: Self-pay

## 2018-06-03 ENCOUNTER — Telehealth (INDEPENDENT_AMBULATORY_CARE_PROVIDER_SITE_OTHER): Payer: Medicare Other | Admitting: Internal Medicine

## 2018-06-03 DIAGNOSIS — I471 Supraventricular tachycardia: Secondary | ICD-10-CM | POA: Diagnosis not present

## 2018-06-03 DIAGNOSIS — I10 Essential (primary) hypertension: Secondary | ICD-10-CM | POA: Diagnosis not present

## 2018-06-03 NOTE — Patient Instructions (Signed)
Medication Instructions:  No changes If you need a refill on your cardiac medications before your next appointment, please call your pharmacy.   Lab work: none If you have labs (blood work) drawn today and your tests are completely normal, you will receive your results only by: Marland Kitchen MyChart Message (if you have MyChart) OR . A paper copy in the mail If you have any lab test that is abnormal or we need to change your treatment, we will call you to review the results.  Testing/Procedures: none  Follow-Up: Your physician recommends that you schedule a follow-up appointment as needed with Dr. Harrington Challenger.   Any Other Special Instructions Will Be Listed Below (If Applicable).

## 2018-06-03 NOTE — Telephone Encounter (Signed)
Follow Up: ° ° ° °Returning your call from yesterday. °

## 2018-06-03 NOTE — Telephone Encounter (Signed)

## 2018-06-03 NOTE — Progress Notes (Signed)
Virtual Visit via Video Note   This visit type was conducted due to national recommendations for restrictions regarding the COVID-19 Pandemic (e.g. social distancing) in an effort to limit this patient's exposure and mitigate transmission in our community.  Due to her co-morbid illnesses, this patient is at least at moderate risk for complications without adequate follow up.  This format is felt to be most appropriate for this patient at this time.  All issues noted in this document were discussed and addressed.  A limited physical exam was performed with this format.  Please refer to the patient's chart for her consent to telehealth for [facility].   Date:  06/03/2018   ID:  Rebecca Rich, DOB 1945/12/13, MRN [id number]  Patient Location: Home Provider Location: Home  PCP:  Burnis Medin, MD  Cardiologist:  No primary care provider on file.  Electrophysiologist:  None   Evaluation Performed:  Follow-Up Visit  Chief Complaint:  F/U of HTN and SVT  History of Present Illness:    Rebecca Rich is a 73 y.o. female with hx of HTN, HL.obesity, DM and SVT   I saw her in December when she was admitted for appendicitis   She had a couple brief runs of SVT in perop period None since Echo done   LVE normal   Aortic valve with very mild stenosis The pt was seen once in clinic in Feb by B BHagat  With mild elevated troponin in hosp he sched a myovue   This was done on Feb 25   No ischemia noted  Since seen the pt denies CP  Breathing is OK   She also denies palpitatoins       The patient does not have symptoms concerning for COVID-19 infection (fever, chills, cough, or new shortness of breath).    Past Medical History:  Diagnosis Date  . Acute bronchospasm 08/24/2009  . Acute lower GI bleeding 02/16/2017  . Acute perforated appendicitis w abscess s/p lap appendectomy 12/30/2017 12/30/2017  . Acute sphenoidal sinusitis 02/26/2010   Qualifier: Diagnosis of  By: Regis Bill MD, Standley Brooking   .  CARDIAC MURMUR 12/08/2006  . COLONIC POLYPS, HX OF 12/08/2006  . DIABETES MELLITUS, TYPE II 12/08/2006  . Gallbladder/common duct stone, without infection, with obstruction 03/01/2010   removed ercp  . HYPERLIPIDEMIA 12/08/2006  . HYPERTENSION 12/08/2006  . 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 INFECTION, SKIN AND SOFT TISSUE 07/28/2008  . 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 LIVER FUNCTION TESTS, ABNORMAL 07/28/2008  . Morbid obesity ([place]) 12/08/2006  . OBESITY 09/24/2009  . OBSTRUCTIVE SLEEP APNEA 12/08/2006  . OSTEOARTHRITIS 12/08/2006  . RUQ PAIN 06/16/2008  . SHOULDER PAIN, RIGHT 02/07/2008  . Sleep apnea   . Swelling of limb 07/28/2008  . THYROID FUNCTION TEST, ABNORMAL 12/08/2006   Past Surgical History:  Procedure Laterality Date  . ABDOMINAL HYSTERECTOMY    . CHOLECYSTECTOMY  06/27/2008  . COLONOSCOPY N/A 12/13/2012   Procedure: COLONOSCOPY;  Surgeon: Juanita Craver, MD;  Location: [facility];  Service: Endoscopy;  Laterality: N/A;  . COLONOSCOPY Left 02/17/2017   Procedure: COLONOSCOPY;  Surgeon: Carol Ada, MD;  Location: [facility];  Service: Endoscopy;  Laterality: Left;  . common duct stone     ERCP   . LAPAROSCOPIC APPENDECTOMY N/A 12/30/2017   Procedure: APPENDECTOMY LAPAROSCOPIC;  Surgeon: Stark Klein, MD;  Location: [facility];  Service: General;  Laterality: N/A;     Current Meds  Medication Sig  . amLODipine (NORVASC) 5 MG tablet  TAKE 1 TABLET BY MOUTH EVERY DAY  . chlorthalidone (HYGROTON) 25 MG tablet TAKE 1 TABLET BY MOUTH EVERY DAY  . cholecalciferol (VITAMIN D) 1000 UNITS tablet Take 1,000 Units by mouth daily.  Marland Kitchen glucose blood (ACCU-CHEK AVIVA PLUS) test strip Use to check blood sugar daily E11.9  . Lancets (ACCU-CHEK SOFT TOUCH) lancets Use to test blood glucose twice daily  . levothyroxine (SYNTHROID, LEVOTHROID) 75 MCG tablet TAKE 1 TABLET (75 MCG TOTAL) BY MOUTH DAILY BEFORE BREAKFAST.  Marland Kitchen losartan (COZAAR) 100 MG tablet TAKE 1 TABLET (100 MG TOTAL) BY MOUTH DAILY. MAY BEGIN  AT 50 MG PER DAY AND INCREASE TO 100 MG  . Misc Natural Products (OSTEO BI-FLEX ADV JOINT SHIELD PO) Take 1 tablet by mouth daily.  . Multiple Vitamins-Minerals (CENTRUM SILVER PO) Take 1 tablet by mouth daily.    . potassium chloride (K-DUR) 10 MEQ tablet Take 1 tablet (10 mEq total) by mouth 3 (three) times daily.     Allergies:   Patient has no known allergies.   Social History   Tobacco Use  . Smoking status: Never Smoker  . Smokeless tobacco: Never Used  Substance Use Topics  . Alcohol use: No    Alcohol/week: 0.0 standard drinks  . Drug use: No     Family Hx: The patient's family history includes Cancer in her father and mother; Cervical cancer in her mother; Diabetes in her brother, brother, brother, sister, sister, and another family member; Heart disease in her brother, brother, brother, and sister; Other in her mother; Stroke in her sister; Throat cancer in her father.  ROS:   Please see the history of present illness.     All other systems reviewed and are negative.   Prior CV studies:   The following studies were reviewed today:    Labs/Other Tests and Data Reviewed:    EKG:  EKG not done as tele visit  Recent Labs: 01/04/2018: ALT 22 01/06/2018: Magnesium 2.0 03/29/2018: BUN 20; Creatinine, Ser 0.98; Hemoglobin 12.1; Platelets 156.0; Potassium 3.7; Sodium 140; TSH 2.77   Recent Lipid Panel Lab Results  Component Value Date/Time   CHOL 151 03/29/2018 08:43 AM   TRIG 132.0 03/29/2018 08:43 AM   HDL 55.40 03/29/2018 08:43 AM   CHOLHDL 3 03/29/2018 08:43 AM   LDLCALC 69 03/29/2018 08:43 AM    Wt Readings from Last 3 Encounters:  06/03/18 261 lb (118.4 kg)  04/05/18 279 lb 4.8 oz (126.7 kg)  03/22/18 267 lb (121.1 kg)     Objective:    Vital Signs:  BP (!) 143/64   Ht 5\' 1"  (1.549 m)   Wt 261 lb (118.4 kg)   BMI 49.32 kg/m    Vital signs reviewed  ASSESSMENT & PLAN:    1  SVT    Short episodes while in hosp (perop period)   No symptoms to  sugg recurrence  Follow   2  Hx HTN   Keep on same meds     3  COVID-19 Education: The signs and symptoms of COVID-19 were discussed with the patient and how to seek care for testing (follow up with PCP or arrange E-visit).  The importance of social distancing was discussed today.  F/U as needed    Time:   Today, I have spent 15 minutes with the patient with telehealth technology discussing the above problems.     Medication Adjustments/Labs and Tests Ordered: Current medicines are reviewed at length with the patient today.  Concerns regarding medicines  are outlined above.   Tests Ordered: No orders of the defined types were placed in this encounter.   Medication Changes: No orders of the defined types were placed in this encounter.   Disposition:  Follow up as needed   Signed, Dorris Carnes, MD  06/03/2018 11:49 AM    [place]

## 2018-06-12 ENCOUNTER — Other Ambulatory Visit: Payer: Self-pay | Admitting: Internal Medicine

## 2018-06-25 ENCOUNTER — Other Ambulatory Visit (HOSPITAL_COMMUNITY)
Admission: RE | Admit: 2018-06-25 | Discharge: 2018-06-25 | Disposition: A | Discharge status: Home / Self Care | Payer: Medicare Other | Source: Ambulatory Visit | Attending: Internal Medicine | Admitting: Internal Medicine

## 2018-06-25 DIAGNOSIS — Z1159 Encounter for screening for other viral diseases: Secondary | ICD-10-CM | POA: Diagnosis not present

## 2018-06-26 LAB — NOVEL CORONAVIRUS, NAA (HOSP ORDER, SEND-OUT TO REF LAB; TAT 18-24 HRS): SARS-CoV-2, NAA: NOT DETECTED

## 2018-06-29 ENCOUNTER — Ambulatory Visit (HOSPITAL_BASED_OUTPATIENT_CLINIC_OR_DEPARTMENT_OTHER): Payer: Medicare Other | Attending: Internal Medicine | Admitting: Internal Medicine

## 2018-06-29 ENCOUNTER — Other Ambulatory Visit: Payer: Self-pay

## 2018-06-29 ENCOUNTER — Ambulatory Visit: Payer: Medicare Other | Admitting: Sports Medicine

## 2018-06-29 VITALS — Ht 61.0 in | Wt 262.0 lb

## 2018-06-29 DIAGNOSIS — G4733 Obstructive sleep apnea (adult) (pediatric): Secondary | ICD-10-CM | POA: Diagnosis not present

## 2018-06-29 DIAGNOSIS — Z79899 Other long term (current) drug therapy: Secondary | ICD-10-CM | POA: Insufficient documentation

## 2018-06-30 ENCOUNTER — Other Ambulatory Visit (HOSPITAL_BASED_OUTPATIENT_CLINIC_OR_DEPARTMENT_OTHER): Payer: Self-pay

## 2018-06-30 DIAGNOSIS — G4733 Obstructive sleep apnea (adult) (pediatric): Secondary | ICD-10-CM

## 2018-07-01 ENCOUNTER — Telehealth: Payer: Self-pay | Admitting: Pulmonary Disease

## 2018-07-01 DIAGNOSIS — G4733 Obstructive sleep apnea (adult) (pediatric): Secondary | ICD-10-CM

## 2018-07-01 NOTE — Telephone Encounter (Signed)
Pt had CPAP titration performed 6/2 and is wanting to know if the results are available. Dr. Elsworth Soho, please advise on this for pt. Thanks!

## 2018-07-03 DIAGNOSIS — G4733 Obstructive sleep apnea (adult) (pediatric): Secondary | ICD-10-CM

## 2018-07-03 NOTE — Procedures (Signed)
Patient Name: Rebecca Rich, Rebecca Rich Date: 06/29/2018 Gender: Female D.O.B: 1945/03/29 Age (years): 37 Referring Provider: Baird Lyons MD, ABSM Height (inches): 61 Interpreting Physician: Baird Lyons MD, ABSM Weight (lbs): 262 RPSGT: Carolin Coy BMI: 36 MRN: [id number] Neck Size: 16.00  CLINICAL INFORMATION The patient is referred for a BiPAP titration to treat sleep apnea. Date of NPSG, Split Night or HST:  HST 03/23/2018  AHI 76.1/ hr, desaturation to 62%, body weight 267 lbs  SLEEP STUDY TECHNIQUE As per the AASM Manual for the Scoring of Sleep and Associated Events v2.3 (April 2016) with a hypopnea requiring 4% desaturations.  The channels recorded and monitored were frontal, central and occipital EEG, electrooculogram (EOG), submentalis EMG (chin), nasal and oral airflow, thoracic and abdominal wall motion, anterior tibialis EMG, snore microphone, electrocardiogram, and pulse oximetry. Bilevel positive airway pressure (BPAP) was initiated at the beginning of the study and titrated to treat sleep-disordered breathing.  MEDICATIONS Medications self-administered by patient taken the night of the study : NORVASC, COZAAR  RESPIRATORY PARAMETERS Optimal IPAP Pressure (cm): 25 AHI at Optimal Pressure (/hr) 7.7 Optimal EPAP Pressure (cm): 20   Overall Minimal O2 (%): 83.0 Minimal O2 at Optimal Pressure (%): 94.0 SLEEP ARCHITECTURE Start Time: 10:19:47 PM Stop Time: 5:21:46 AM Total Time (min): 422 Total Sleep Time (min): 231.5 Sleep Latency (min): 52.9 Sleep Efficiency (%): 54.9% REM Latency (min): 228.0 WASO (min): 137.6 Stage N1 (%): 33.5% Stage N2 (%): 65.2% Stage N3 (%): 0.0% Stage R (%): 1.3 Supine (%): 100.00 Arousal Index (/hr): 64.0    CARDIAC DATA The 2 lead EKG demonstrated sinus rhythm. The mean heart rate was 56.5 beats per minute. Other EKG findings include: PVCs.  LEG MOVEMENT DATA The total Periodic Limb Movements of Sleep (PLMS) were 0. The PLMS index was  0.0. A PLMS index of <15 is considered normal in adults.  IMPRESSIONS - CPAP provided inadequate control at tolerated presures and was changed to BIPAP - An optimal BIPAP pressure was selected for this patient ( 25 / 20 cm of water) - Central sleep apnea was not noted during this titration (CAI = 2.6/h). - Moderate oxygen desaturations were observed during this titration (min O2 = 83.0%). Min sat at final BIPAP was 94%. - The patient snored with soft snoring volume. - 2-lead EKG demonstrated: PVCs - Clinically significant periodic limb movements were not noted during this study. Arousals associated with PLMs were rare.  DIAGNOSIS - Obstructive Sleep Apnea (327.23 [G47.33 ICD-10])  RECOMMENDATIONS - Note that this pressure requirement is unusually highy, especially for a female. If uncomfortable, consider ENT evaluation, autopap or re-titration.  - Trial of BiPAP therapy on 25/20 cm H2O. Patient used a Medium Wide size [employer] Dreamwear mask and heated humidification. - Be careful with alcohol, sedatives and other CNS depressants that may worsen sleep apnea and disrupt normal sleep architecture. - Sleep hygiene should be reviewed to assess factors that may improve sleep quality. - Weight management and regular exercise should be initiated or continued.  [Electronically signed] 07/03/2018 01:39 PM  Baird Lyons MD, [place], American Board of Sleep Medicine   NPI: [id number]                          [place], [place] of Sleep Medicine  ELECTRONICALLY SIGNED ON:  07/03/2018, 1:32 PM [place] PH: [phone]   FX: [phone]) [place]  SLEEP MEDICINE

## 2018-07-05 NOTE — Telephone Encounter (Signed)
Called and spoke with pt letting her know that RA said results for titration study should be available by tomorrow and pt verbalized understanding. Keeping encounter open until pt has been able to be told the results.

## 2018-07-05 NOTE — Telephone Encounter (Signed)
Should be able to get results by tomorrow

## 2018-07-06 NOTE — Telephone Encounter (Signed)
This study was read by Dr. Annamaria Boots. Sleep apnea was severe and CPAP was inadequate but was corrected by BiPAP 25/20 cm Please send prescription for auto BiPAP-EPAP minimum 10 cm, IPAP maximum 25 cm, pressure support 5 cm Medium Wide size [employer] Respironics Full Face Mask Dreamwear mask and heated humidification.  Arrange follow-up with me in 1 month

## 2018-07-06 NOTE — Telephone Encounter (Signed)
Called and spoke with pt letting her know the results of the titration study and stated to her that we were going to switch her from CPAP to BIPAP based on the results of the study. Pt verbalized understanding.  Stated to pt that we would get her on schedule with RA in 1 month once his schedule opened up so recall has been placed. Order placed for the bipap start. Nothing further needed.

## 2018-07-07 NOTE — Addendum Note (Signed)
Addended by: Lorretta Harp on: 07/07/2018 01:55 PM   Modules accepted: Orders

## 2018-07-07 NOTE — Telephone Encounter (Signed)
New order has been placed for Sarah to sign off on. Nothing further needed.

## 2018-07-07 NOTE — Telephone Encounter (Signed)
Yesterday, 07/06/2018 after order was placed for pt to be switched from CPAP to BIPAP.  Per Judeen Hammans, RA did not sign the order yesterday 6/9 after it was placed. RA is currently working at the hospital today and then will be on vacation starting tomorrow.  Sarah, please advise if you will be fine signing order for pt to be switched to BIPAP after I place the order per RA. Thanks!

## 2018-07-07 NOTE — Telephone Encounter (Signed)
Yes

## 2018-07-13 ENCOUNTER — Ambulatory Visit (INDEPENDENT_AMBULATORY_CARE_PROVIDER_SITE_OTHER): Payer: Medicare Other

## 2018-07-13 ENCOUNTER — Other Ambulatory Visit: Payer: Self-pay

## 2018-07-13 ENCOUNTER — Ambulatory Visit (INDEPENDENT_AMBULATORY_CARE_PROVIDER_SITE_OTHER): Payer: Medicare Other | Admitting: Sports Medicine

## 2018-07-13 ENCOUNTER — Encounter: Payer: Self-pay | Admitting: Sports Medicine

## 2018-07-13 ENCOUNTER — Other Ambulatory Visit: Payer: Self-pay | Admitting: Sports Medicine

## 2018-07-13 VITALS — Temp 97.6°F

## 2018-07-13 DIAGNOSIS — E1169 Type 2 diabetes mellitus with other specified complication: Secondary | ICD-10-CM

## 2018-07-13 DIAGNOSIS — M79676 Pain in unspecified toe(s): Secondary | ICD-10-CM

## 2018-07-13 DIAGNOSIS — M778 Other enthesopathies, not elsewhere classified: Secondary | ICD-10-CM

## 2018-07-13 DIAGNOSIS — M1000 Idiopathic gout, unspecified site: Secondary | ICD-10-CM

## 2018-07-13 DIAGNOSIS — M79672 Pain in left foot: Secondary | ICD-10-CM | POA: Diagnosis not present

## 2018-07-13 DIAGNOSIS — M79609 Pain in unspecified limb: Secondary | ICD-10-CM

## 2018-07-13 DIAGNOSIS — E119 Type 2 diabetes mellitus without complications: Secondary | ICD-10-CM

## 2018-07-13 DIAGNOSIS — M779 Enthesopathy, unspecified: Secondary | ICD-10-CM

## 2018-07-13 DIAGNOSIS — B351 Tinea unguium: Secondary | ICD-10-CM

## 2018-07-13 MED ORDER — TRIAMCINOLONE ACETONIDE 10 MG/ML IJ SUSP
10.0000 mg | Freq: Once | INTRAMUSCULAR | Status: AC
  Administered 2018-07-13: 10 mg

## 2018-07-13 NOTE — Progress Notes (Signed)
Subjective: Rebecca Rich is a 73 y.o. female patient with history of diabetes who returns to office today complaining of long,mildly painful nails  while ambulating in shoes; unable to trim. Reports that  She has a new pain at the top of the left foot that started with redness, warmth, and swelling to top of the left foot after wearing a pair of clogs. Patient reports that pain is 4-7/10 worse with use with still swelling and redness but decreased warmth. Patient is unsure of gout history but denies any changes to diet. Reports that A1c is unknown and that her last blood sugar was not checked.  No other issues noted.   Review of Systems  Cardiovascular: Positive for leg swelling.  Musculoskeletal: Positive for joint pain.  All other systems reviewed and are negative.    Patient Active Problem List   Diagnosis Date Noted  . Numbness and tingling of left leg 04/05/2018  . Influenza vaccination declined by patient 04/05/2018  . SVT (supraventricular tachycardia) ([place]) 12/31/2017  . Diverticulosis of intestine with bleeding   . Rectal bleeding   . Hx of gout 08/21/2015  . Mild aortic stenosis 08/21/2015  . Muscle spasm 03/22/2013  . Skin lesion of back 09/13/2012  . Left shoulder pain 01/20/2012  . Hypothyroid 05/31/2011  . Elevated uric acid in blood 05/31/2011  . Palmar nodule 03/25/2011  . Abnormal urine 02/20/2011  . 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 SHOULDER PAIN, RIGHT 02/07/2008  . Diabetes mellitus type 2 in obese ([place]) 12/08/2006  . HYPERLIPIDEMIA 12/08/2006  . Morbid obesity with BMI of 50.0-59.9, adult ([place]) 12/08/2006  . OSA on CPAP 12/08/2006  . Essential hypertension 12/08/2006  . OSTEOARTHRITIS 12/08/2006  . THYROID FUNCTION TEST, ABNORMAL 12/08/2006  . COLONIC POLYPS, HX OF 12/08/2006   Current Outpatient Medications on File Prior to Visit  Medication Sig Dispense Refill  . amLODipine (NORVASC) 5 MG tablet TAKE 1 TABLET BY MOUTH EVERY  DAY 90 tablet 0  . chlorthalidone (HYGROTON) 25 MG tablet TAKE 1 TABLET BY MOUTH EVERY DAY 90 tablet 0  . cholecalciferol (VITAMIN D) 1000 UNITS tablet Take 1,000 Units by mouth daily.    Marland Kitchen glucose blood (ACCU-CHEK AVIVA PLUS) test strip Use to check blood sugar daily E11.9 100 each 12  . Lancets (ACCU-CHEK SOFT TOUCH) lancets Use to test blood glucose twice daily 50 each 12  . levothyroxine (SYNTHROID) 75 MCG tablet TAKE 1 TABLET (75 MCG TOTAL) BY MOUTH DAILY BEFORE BREAKFAST. 90 tablet 0  . losartan (COZAAR) 100 MG tablet TAKE 1 TABLET (100 MG TOTAL) BY MOUTH DAILY. MAY BEGIN AT 50 MG PER DAY AND INCREASE TO 100 MG 90 tablet 1  . metoprolol succinate (TOPROL XL) 25 MG 24 hr tablet Take 1 tablet (25 mg total) by mouth daily. 90 tablet 2  . Misc Natural Products (OSTEO BI-FLEX ADV JOINT SHIELD PO) Take 1 tablet by mouth daily.    . Multiple Vitamins-Minerals (CENTRUM SILVER PO) Take 1 tablet by mouth daily.      . potassium chloride (K-DUR) 10 MEQ tablet Take 1 tablet (10 mEq total) by mouth 3 (three) times daily. 90 tablet 5   No current facility-administered medications on file prior to visit.    No Known Allergies  Recent Results (from the past 2160 hour(s))  Novel Coronavirus, NAA (hospital order; send-out to ref lab)     Status: None   Collection Time: 06/25/18 12:19 PM   Specimen: Nasopharyngeal Swab; Respiratory  Result Value Ref Range   SARS-CoV-2, NAA NOT DETECTED NOT DETECTED    Comment: (NOTE) This test was developed and its performance characteristics determined by [employer]. This test has not been FDA cleared or approved. This test has been authorized by FDA under an Emergency Use Authorization (EUA). This test is only authorized for the duration of time the declaration that circumstances exist justifying the authorization of the emergency use of in vitro diagnostic tests for detection of SARS-CoV-2 virus and/or diagnosis of COVID-19 infection under section  564(b)(1) of the Act, 21 U.S.C. 449QPR-9(F)(6), unless the authorization is terminated or revoked sooner. When diagnostic testing is negative, the possibility of a false negative result should be considered in the context of a patient's recent exposures and the presence of clinical signs and symptoms consistent with COVID-19. An individual without symptoms of COVID-19 and who is not shedding SARS-CoV-2 virus would expect to have a negative (not detected) result in this assay. Performed  At: [facility] [place], [place] [id number] Rush Farmer MD TT:[id number]    Coronavirus Source NASOPHARYNGEAL     Comment: Performed at [facility], [place] [address] [facility] [employer], [place] [address]    Objective: General: Patient is awake, alert, and oriented x 3 and in no acute distress.  Integument: Skin is warm, dry and supple bilateral. Nails are tender, long, thickened and  dystrophic with subungual debris, consistent with onychomycosis, 1-5 bilateral. No acute ingrowing on left great toe. No signs of infection. No open lesions or preulcerative lesions present bilateral. To the left dorsal midfoot there is redness and warmth and swelling possible gout flare. Remaining integument unremarkable.  Vasculature:  Dorsalis Pedis pulse 2/4 bilateral. Posterior Tibial pulse  1/4 bilateral.  Capillary fill time <3 sec 1-5 bilateral. Positive hair growth to the level of the digits. Temperature gradient increased on left. No varicosities present bilateral. Mild edema present bilateral, L>R at dorsal foot at area of pain.   Neurology: The patient has intact sensation measured with a 5.07/10g Semmes Weinstein Monofilament at all pedal sites bilateral . Vibratory sensation diminished bilateral with tuning fork. No Babinski sign present bilateral.   Musculoskeletal: Asymptomatic pes pedal deformities noted bilateral. Muscular strength 5/5 in all lower extremity muscular groups bilateral  without pain on range of motion . No tenderness with calf compression bilateral.  Xrays Left foot no fracture, diffuse arthritis and soft tissue swelling. No other acute findings.   Assessment and Plan: Problem List Items Addressed This Visit      Endocrine   Diabetes mellitus type 2 in obese [facility])    Other Visit Diagnoses    Pain in left foot    -  Primary   Relevant Medications   triamcinolone acetonide (KENALOG) 10 MG/ML injection 10 mg (Completed)   Capsulitis       Relevant Medications   triamcinolone acetonide (KENALOG) 10 MG/ML injection 10 mg (Completed)   Acute idiopathic gout, unspecified site       Relevant Medications   triamcinolone acetonide (KENALOG) 10 MG/ML injection 10 mg (Completed)   Pain due to onychomycosis of nail       Diabetes mellitus without complication ([place])          -Examined patient. -Xrays reviewed  -Discussed and educated patient on diabetic foot care and new Left foot pain likely gout vs arthritis flare -After oral consent and aseptic prep, injected a mixture containing 1 ml of 2%  plain lidocaine, 1 ml 0.5% plain marcaine,  0.5 ml of kenalog 10 and 0.5 ml of dexamethasone phosphate into left dorsal midfoot without complication. Post-injection care discussed with patient.  -Mechanically debrided all nails 1-5 bilateral using sterile nail nipper and filed with dremel without incident  -Answered all patient questions -Patient to return  in 3 months for at risk foot care. Advised patient to come in sooner if her left foot is still hurting -Patient advised to call the office if any problems or questions arise in the meantime.  Landis Martins, DPM

## 2018-07-23 ENCOUNTER — Other Ambulatory Visit: Payer: Self-pay | Admitting: Internal Medicine

## 2018-07-30 NOTE — Progress Notes (Signed)
Chief Complaint  Patient presents with  . Follow-up    Pt is here for a follow up on diabetes and is concerned abou ther bruise getting lighter on her stomach on left side     HPI:  Rebecca Rich   For Chronic disease management since last visit has done well with healthy weight loss and feels well  No new numbness    On no meds for dm  Bp has been good no se of meds at this time.  Has bum dark and pink on left abdomen below the blister are ( that she got from tape s/p surgery)no pain itching  Not sure how long is there but color not typical of her keloids    ? Need glasses newer  Last eye check jan 2020 but no recent glasses   To get new machine osa  ROS: See pertinent positives and negatives per HPI.  Past Medical History:  Diagnosis Date  . Acute bronchospasm 08/24/2009  . Acute lower GI bleeding 02/16/2017  . Acute perforated appendicitis w abscess s/p lap appendectomy 12/30/2017 12/30/2017  . Acute sphenoidal sinusitis 02/26/2010   Qualifier: Diagnosis of  By: Regis Bill MD, Standley Brooking   . CARDIAC MURMUR 12/08/2006  . COLONIC POLYPS, HX OF 12/08/2006  . DIABETES MELLITUS, TYPE II 12/08/2006  . Gallbladder/common duct stone, without infection, with obstruction 03/01/2010   removed ercp  . HYPERLIPIDEMIA 12/08/2006  . HYPERTENSION 12/08/2006  . 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 INFECTION, SKIN AND SOFT TISSUE 07/28/2008  . 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 LIVER FUNCTION TESTS, ABNORMAL 07/28/2008  . Morbid obesity ([place]) 12/08/2006  . OBESITY 09/24/2009  . OBSTRUCTIVE SLEEP APNEA 12/08/2006  . OSTEOARTHRITIS 12/08/2006  . RUQ PAIN 06/16/2008  . SHOULDER PAIN, RIGHT 02/07/2008  . Sleep apnea   . Swelling of limb 07/28/2008  . THYROID FUNCTION TEST, ABNORMAL 12/08/2006    Family History  Problem Relation Age of Onset  . Other Mother        blood clots  . Cervical cancer Mother   . Cancer Mother   . Heart disease Brother   . Diabetes Brother   . Heart disease Sister   . Diabetes Sister    . Heart disease Brother   . Diabetes Brother   . Heart disease Brother   . Diabetes Brother   . Stroke Sister   . Diabetes Sister   . Throat cancer Father   . Cancer Father   . Diabetes Other        all siblings, 4 brothers, 5 sisters    Social History   Socioeconomic History  . Marital status: Divorced    Spouse name: Not on file  . Number of children: Not on file  . Years of education: Not on file  . Highest education level: Not on file  Occupational History  . Occupation: retired    Fish farm manager: RETIRED  Social Needs  . Financial resource strain: Not on file  . Food insecurity    Worry: Not on file    Inability: Not on file  . Transportation needs    Medical: Not on file    Non-medical: Not on file  Tobacco Use  . Smoking status: Never Smoker  . Smokeless tobacco: Never Used  Substance and Sexual Activity  . Alcohol use: No    Alcohol/week: 0.0 standard drinks  . Drug use: No  . Sexual activity: Not on file  Lifestyle  . Physical activity    Days per week:  Not on file    Minutes per session: Not on file  . Stress: Not on file  Relationships  . Social Herbalist on phone: Not on file    Gets together: Not on file    Attends religious service: Not on file    Active member of club or organization: Not on file    Attends meetings of clubs or organizations: Not on file    Relationship status: Not on file  Other Topics Concern  . Not on file  Social History Narrative   Master level education in math   Pt is currently retired   Pt is divorced with children   Recently had to move had a break in and thus away from her pool exercise    Outpatient Medications Prior to Visit  Medication Sig Dispense Refill  . amLODipine (NORVASC) 5 MG tablet TAKE 1 TABLET BY MOUTH EVERY DAY 90 tablet 0  . chlorthalidone (HYGROTON) 25 MG tablet TAKE 1 TABLET BY MOUTH EVERY DAY 90 tablet 0  . cholecalciferol (VITAMIN D) 1000 UNITS tablet Take 1,000 Units by mouth daily.     Marland Kitchen glucose blood (ACCU-CHEK AVIVA PLUS) test strip Use to check blood sugar daily E11.9 100 each 12  . Lancets (ACCU-CHEK SOFT TOUCH) lancets Use to test blood glucose twice daily 50 each 12  . levothyroxine (SYNTHROID) 75 MCG tablet TAKE 1 TABLET (75 MCG TOTAL) BY MOUTH DAILY BEFORE BREAKFAST. 90 tablet 0  . losartan (COZAAR) 100 MG tablet TAKE 1 TABLET (100 MG TOTAL) BY MOUTH DAILY. MAY BEGIN AT 50 MG PER DAY AND INCREASE TO 100 MG 90 tablet 1  . metoprolol succinate (TOPROL XL) 25 MG 24 hr tablet Take 1 tablet (25 mg total) by mouth daily. 90 tablet 2  . Misc Natural Products (OSTEO BI-FLEX ADV JOINT SHIELD PO) Take 1 tablet by mouth daily.    . Multiple Vitamins-Minerals (CENTRUM SILVER PO) Take 1 tablet by mouth daily.      . potassium chloride (K-DUR) 10 MEQ tablet Take 1 tablet (10 mEq total) by mouth 3 (three) times daily. 90 tablet 5   No facility-administered medications prior to visit.      EXAM:  BP 138/60 (BP Location: Right Arm, Patient Position: Sitting, Cuff Size: Large)   Pulse 75   Temp 98.3 F (36.8 C) (Oral)   Wt 260 lb 12.8 oz (118.3 kg)   SpO2 96%   BMI 49.28 kg/m   Body mass index is 49.28 kg/m.  GENERAL: vitals reviewed and listed above, alert, oriented, appears well hydrated and in no acute distress HEENT: atraumatic, conjunctiva  clear, no obvious abnormalities on inspection of external nose and ears  NECK: no obvious masses on inspection  CV: HRRR, no clubbing cyanosis or  peripheral edema nl cap refill  abd  A 9 x14 mm hyperpigmented scar type lesion with pinkish  center  No bleeding and no tenderness MS: moves all extremities without noticeable focal  abnormality PSYCH: pleasant and cooperative, no obvious depression or anxiety Lab Results  Component Value Date   WBC 5.2 03/29/2018   HGB 12.1 03/29/2018   HCT 36.0 03/29/2018   PLT 156.0 03/29/2018   GLUCOSE 130 (H) 03/29/2018   CHOL 151 03/29/2018   TRIG 132.0 03/29/2018   HDL 55.40 03/29/2018    LDLCALC 69 03/29/2018   ALT 22 01/04/2018   AST 22 01/04/2018   NA 140 03/29/2018   K 3.7 03/29/2018   CL 105 03/29/2018  CREATININE 0.98 03/29/2018   BUN 20 03/29/2018   CO2 25 03/29/2018   TSH 2.77 03/29/2018   HGBA1C 6.6 (A) 08/02/2018   MICROALBUR 0.4 08/02/2009   BP Readings from Last 3 Encounters:  08/02/18 138/60  06/03/18 (!) 143/64  04/05/18 132/64   Wt Readings from Last 3 Encounters:  08/02/18 260 lb 12.8 oz (118.3 kg)  06/29/18 262 lb (118.8 kg)  06/03/18 261 lb (118.4 kg)    ASSESSMENT AND PLAN:  Discussed the following assessment and plan:   ICD-10-CM   1. Diabetes mellitus type 2 in obese (HCC)  E11.69 POCT glycosylated hemoglobin (Hb A1C)   E66.9   2. Medication management  Z79.899   3. Essential hypertension  I10   4. Keloid scar ?  L91.0    obersve for growth and color change and can fu if  atypical  5. BMI 45.0-49.9, adult [facility])  [phone]    Doing well  Since covid at home eating better   Continue  Healthy weight loss encouraged   19 pound weight loss since February  Plan rov in 4-6 months -Patient advised to return or notify health care team  if  new concerns arise.  Patient Instructions   Glad you are taking care of yourself!  Follow the skin area  Seems like  A keloid related   We can have you see dermatology if growing  Atypically .   Continue lifestyle intervention healthy eating and exercise .  Plan 4-6 months viist depending on how doing .   Flu vacine in the fall   Fu your eye doc for vision evaluation  Your hg a1c today id 6.6% and very good   Mariann Laster K.  M.D.

## 2018-08-02 ENCOUNTER — Ambulatory Visit (INDEPENDENT_AMBULATORY_CARE_PROVIDER_SITE_OTHER): Payer: Medicare Other | Admitting: Internal Medicine

## 2018-08-02 ENCOUNTER — Encounter: Payer: Self-pay | Admitting: Internal Medicine

## 2018-08-02 ENCOUNTER — Other Ambulatory Visit: Payer: Self-pay

## 2018-08-02 VITALS — BP 138/60 | HR 75 | Temp 98.3°F | Wt 260.8 lb

## 2018-08-02 DIAGNOSIS — E669 Obesity, unspecified: Secondary | ICD-10-CM | POA: Diagnosis not present

## 2018-08-02 DIAGNOSIS — E1169 Type 2 diabetes mellitus with other specified complication: Secondary | ICD-10-CM

## 2018-08-02 DIAGNOSIS — I10 Essential (primary) hypertension: Secondary | ICD-10-CM | POA: Diagnosis not present

## 2018-08-02 DIAGNOSIS — Z79899 Other long term (current) drug therapy: Secondary | ICD-10-CM

## 2018-08-02 DIAGNOSIS — Z6841 Body Mass Index (BMI) 40.0 and over, adult: Secondary | ICD-10-CM | POA: Diagnosis not present

## 2018-08-02 DIAGNOSIS — L910 Hypertrophic scar: Secondary | ICD-10-CM

## 2018-08-02 LAB — POCT GLYCOSYLATED HEMOGLOBIN (HGB A1C): Hemoglobin A1C: 6.6 % — AB (ref 4.0–5.6)

## 2018-08-02 NOTE — Patient Instructions (Addendum)
  Glad you are taking care of yourself!  Follow the skin area  Seems like  A keloid related   We can have you see dermatology if growing  Atypically .   Continue lifestyle intervention healthy eating and exercise .  Plan 4-6 months viist depending on how doing .   Flu vacine in the fall   Fu your eye doc for vision evaluation  Your hg a1c today id 6.6% and very good

## 2018-08-06 ENCOUNTER — Ambulatory Visit: Payer: Medicare Other | Admitting: Internal Medicine

## 2018-09-13 ENCOUNTER — Other Ambulatory Visit: Payer: Self-pay | Admitting: Internal Medicine

## 2018-10-12 ENCOUNTER — Other Ambulatory Visit: Payer: Self-pay

## 2018-10-12 ENCOUNTER — Encounter: Payer: Self-pay | Admitting: Sports Medicine

## 2018-10-12 ENCOUNTER — Ambulatory Visit (INDEPENDENT_AMBULATORY_CARE_PROVIDER_SITE_OTHER): Payer: Medicare Other | Admitting: Sports Medicine

## 2018-10-12 DIAGNOSIS — B351 Tinea unguium: Secondary | ICD-10-CM | POA: Diagnosis not present

## 2018-10-12 DIAGNOSIS — E119 Type 2 diabetes mellitus without complications: Secondary | ICD-10-CM

## 2018-10-12 DIAGNOSIS — M79676 Pain in unspecified toe(s): Secondary | ICD-10-CM | POA: Diagnosis not present

## 2018-10-12 NOTE — Progress Notes (Signed)
Subjective: Rebecca Rich is a 73 y.o. female patient with history of diabetes who presents to office today complaining of long,mildly painful nails  while ambulating in shoes; unable to trim. Patient denies any new cramping, numbness, burning or tingling in the legs. No other pedal complaints noted.   Reports that A1c 6.6.   Patient Active Problem List   Diagnosis Date Noted  . Numbness and tingling of left leg 04/05/2018  . Influenza vaccination declined by patient 04/05/2018  . SVT (supraventricular tachycardia) ([place]) 12/31/2017  . Diverticulosis of intestine with bleeding   . Rectal bleeding   . Hx of gout 08/21/2015  . Mild aortic stenosis 08/21/2015  . Muscle spasm 03/22/2013  . Skin lesion of back 09/13/2012  . Left shoulder pain 01/20/2012  . Hypothyroid 05/31/2011  . Elevated uric acid in blood 05/31/2011  . Palmar nodule 03/25/2011  . Abnormal urine 02/20/2011  . 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 SHOULDER PAIN, RIGHT 02/07/2008  . Diabetes mellitus type 2 in obese ([place]) 12/08/2006  . HYPERLIPIDEMIA 12/08/2006  . Morbid obesity with BMI of 50.0-59.9, adult ([place]) 12/08/2006  . OSA on CPAP 12/08/2006  . Essential hypertension 12/08/2006  . OSTEOARTHRITIS 12/08/2006  . THYROID FUNCTION TEST, ABNORMAL 12/08/2006  . COLONIC POLYPS, HX OF 12/08/2006   Current Outpatient Medications on File Prior to Visit  Medication Sig Dispense Refill  . amLODipine (NORVASC) 5 MG tablet TAKE 1 TABLET BY MOUTH EVERY DAY 90 tablet 0  . chlorthalidone (HYGROTON) 25 MG tablet TAKE 1 TABLET BY MOUTH EVERY DAY 90 tablet 0  . cholecalciferol (VITAMIN D) 1000 UNITS tablet Take 1,000 Units by mouth daily.    Marland Kitchen glucose blood (ACCU-CHEK AVIVA PLUS) test strip Use to check blood sugar daily E11.9 100 each 12  . Lancets (ACCU-CHEK SOFT TOUCH) lancets Use to test blood glucose twice daily 50 each 12  . levothyroxine (SYNTHROID) 75 MCG tablet TAKE 1 TABLET (75 MCG TOTAL) BY  MOUTH DAILY BEFORE BREAKFAST. 90 tablet 0  . losartan (COZAAR) 100 MG tablet TAKE 1 TABLET (100 MG TOTAL) BY MOUTH DAILY. MAY BEGIN AT 50 MG PER DAY AND INCREASE TO 100 MG 90 tablet 1  . metoprolol succinate (TOPROL XL) 25 MG 24 hr tablet Take 1 tablet (25 mg total) by mouth daily. 90 tablet 2  . Misc Natural Products (OSTEO BI-FLEX ADV JOINT SHIELD PO) Take 1 tablet by mouth daily.    . Multiple Vitamins-Minerals (CENTRUM SILVER PO) Take 1 tablet by mouth daily.      . potassium chloride (K-DUR) 10 MEQ tablet Take 1 tablet (10 mEq total) by mouth 3 (three) times daily. 90 tablet 5   No current facility-administered medications on file prior to visit.    No Known Allergies  Recent Results (from the past 2160 hour(s))  POCT glycosylated hemoglobin (Hb A1C)     Status: Abnormal   Collection Time: 08/02/18  9:31 AM  Result Value Ref Range   Hemoglobin A1C 6.6 (A) 4.0 - 5.6 %   HbA1c POC (<> result, manual entry)     HbA1c, POC (prediabetic range)     HbA1c, POC (controlled diabetic range)      Objective: General: Patient is awake, alert, and oriented x 3 and in no acute distress.  Integument: Skin is warm, dry and supple bilateral. Nails are tender, long, thickened and  dystrophic with subungual debris, consistent with onychomycosis, 1-5 bilateral. No acute ingrowing on left great toe. No signs of  infection. No open lesions or preulcerative lesions present bilateral. Remaining integument unremarkable.  Vasculature:  Dorsalis Pedis pulse 2/4 bilateral. Posterior Tibial pulse  1/4 bilateral.  Capillary fill time <3 sec 1-5 bilateral. Positive hair growth to the level of the digits. Temperature gradient within normal limits. No varicosities present bilateral. No edema present bilateral.   Neurology: The patient has intact sensation measured with a 5.07/10g Semmes Weinstein Monofilament at all pedal sites bilateral . Vibratory sensation diminished bilateral with tuning fork. No Babinski sign  present bilateral.   Musculoskeletal: Asymptomatic pes pedal deformities noted bilateral. Muscular strength 5/5 in all lower extremity muscular groups bilateral without pain on range of motion . No tenderness with calf compression bilateral.  Assessment and Plan: Problem List Items Addressed This Visit    None    Visit Diagnoses    Pain due to onychomycosis of nail    -  Primary   Diabetes mellitus without complication ([place])          -Examined patient. -Discussed and educated patient on diabetic foot care, especially with  regards to the vascular, neurological and musculoskeletal systems.  -Stressed the importance of good glycemic control and the detriment of not  controlling glucose levels in relation to the foot. -Mechanically debrided all nails 1-5 bilateral using sterile nail nipper and filed with dremel without incident  -Answered all patient questions -Patient to return  in 3 months for at risk foot care -Patient advised to call the office if any problems or questions arise in the meantime.  Landis Martins, DPM

## 2018-10-23 ENCOUNTER — Other Ambulatory Visit: Payer: Self-pay | Admitting: Internal Medicine

## 2018-11-13 ENCOUNTER — Other Ambulatory Visit: Payer: Self-pay | Admitting: Internal Medicine

## 2018-11-29 NOTE — Progress Notes (Signed)
Chief Complaint  Patient presents with  . Diabetes    Pt states that her sugars have remained normal     HPI: Rebecca Rich 73 y.o. come in for Chronic disease management   DM doing well cooking fro self in  covid shut down feels well  New CPAP  machine has been helpful Not seeing podiatry every 3-4 months  No lesions . NUmbness tingling area left thigh a bit less  No neuropathy sx feet  Moved first September  Increaseing steps  .   upt to 1000.   Stays isolated  But  Contact with sister working on activity  Due for eye check in January  ROS: See pertinent positives and negatives per HPI. No cp sob had pulled muscklep left chest  Hurt when moved shoulder now better   Past Medical History:  Diagnosis Date  . Acute bronchospasm 08/24/2009  . Acute lower GI bleeding 02/16/2017  . Acute perforated appendicitis w abscess s/p lap appendectomy 12/30/2017 12/30/2017  . Acute sphenoidal sinusitis 02/26/2010   Qualifier: Diagnosis of  By: Regis Bill MD, Standley Brooking   . CARDIAC MURMUR 12/08/2006  . COLONIC POLYPS, HX OF 12/08/2006  . DIABETES MELLITUS, TYPE II 12/08/2006  . Gallbladder/common duct stone, without infection, with obstruction 03/01/2010   removed ercp  . HYPERLIPIDEMIA 12/08/2006  . HYPERTENSION 12/08/2006  . 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 INFECTION, SKIN AND SOFT TISSUE 07/28/2008  . 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 LIVER FUNCTION TESTS, ABNORMAL 07/28/2008  . Morbid obesity ([place]) 12/08/2006  . OBESITY 09/24/2009  . OBSTRUCTIVE SLEEP APNEA 12/08/2006  . OSTEOARTHRITIS 12/08/2006  . RUQ PAIN 06/16/2008  . SHOULDER PAIN, RIGHT 02/07/2008  . Sleep apnea   . Swelling of limb 07/28/2008  . THYROID FUNCTION TEST, ABNORMAL 12/08/2006    Family History  Problem Relation Age of Onset  . Other Mother        blood clots  . Cervical cancer Mother   . Cancer Mother   . Heart disease Brother   . Diabetes Brother   . Heart disease Sister   . Diabetes Sister   . Heart disease Brother   . Diabetes  Brother   . Heart disease Brother   . Diabetes Brother   . Stroke Sister   . Diabetes Sister   . Throat cancer Father   . Cancer Father   . Diabetes Other        all siblings, 4 brothers, 5 sisters    Social History   Socioeconomic History  . Marital status: Divorced    Spouse name: Not on file  . Number of children: Not on file  . Years of education: Not on file  . Highest education level: Not on file  Occupational History  . Occupation: retired    Fish farm manager: RETIRED  Social Needs  . Financial resource strain: Not on file  . Food insecurity    Worry: Not on file    Inability: Not on file  . Transportation needs    Medical: Not on file    Non-medical: Not on file  Tobacco Use  . Smoking status: Never Smoker  . Smokeless tobacco: Never Used  Substance and Sexual Activity  . Alcohol use: No    Alcohol/week: 0.0 standard drinks  . Drug use: No  . Sexual activity: Not on file  Lifestyle  . Physical activity    Days per week: Not on file    Minutes per session: Not on file  . Stress: Not  on file  Relationships  . Social Herbalist on phone: Not on file    Gets together: Not on file    Attends religious service: Not on file    Active member of club or organization: Not on file    Attends meetings of clubs or organizations: Not on file    Relationship status: Not on file  Other Topics Concern  . Not on file  Social History Narrative   Master level education in math   Pt is currently retired   Pt is divorced with children   Recently had to move had a break in and thus away from her pool exercise    Outpatient Medications Prior to Visit  Medication Sig Dispense Refill  . amLODipine (NORVASC) 5 MG tablet TAKE 1 TABLET BY MOUTH EVERY DAY 90 tablet 0  . chlorthalidone (HYGROTON) 25 MG tablet TAKE 1 TABLET BY MOUTH EVERY DAY 90 tablet 0  . cholecalciferol (VITAMIN D) 1000 UNITS tablet Take 1,000 Units by mouth daily.    Marland Kitchen glucose blood (ACCU-CHEK AVIVA  PLUS) test strip Use to check blood sugar daily E11.9 100 each 12  . Lancets (ACCU-CHEK SOFT TOUCH) lancets Use to test blood glucose twice daily 50 each 12  . levothyroxine (SYNTHROID) 75 MCG tablet TAKE 1 TABLET (75 MCG TOTAL) BY MOUTH DAILY BEFORE BREAKFAST. 90 tablet 0  . losartan (COZAAR) 100 MG tablet TAKE 1 TABLET (100 MG TOTAL) BY MOUTH DAILY. MAY BEGIN AT 50 MG PER DAY AND INCREASE TO 100 MG 90 tablet 1  . metoprolol succinate (TOPROL XL) 25 MG 24 hr tablet Take 1 tablet (25 mg total) by mouth daily. 90 tablet 2  . Misc Natural Products (OSTEO BI-FLEX ADV JOINT SHIELD PO) Take 1 tablet by mouth daily.    . Multiple Vitamins-Minerals (CENTRUM SILVER PO) Take 1 tablet by mouth daily.      . potassium chloride (KLOR-CON) 10 MEQ tablet TAKE 1 TABLET (10 MEQ TOTAL) BY MOUTH 3 (THREE) TIMES DAILY. 90 tablet 5   No facility-administered medications prior to visit.      EXAM:  BP 134/66 (BP Location: Right Arm, Patient Position: Sitting, Cuff Size: Normal)   Pulse 81   Temp 97.9 F (36.6 C) (Temporal)   Wt 262 lb 3.2 oz (118.9 kg)   SpO2 98%   BMI 49.54 kg/m   Body mass index is 49.54 kg/m.  GENERAL: vitals reviewed and listed above, alert, oriented, appears well hydrated and in no acute distress HEENT: atraumatic, conjunctiva  clear, no obvious abnormalities on inspection of external nose and ears OP : masked  NECK: no obvious masses on inspection palpation  LUNGS: clear to auscultation bilaterally, no wheezes, rales or rhonchi, CV: HRRR, no clubbing cyanosis or nl cap refill  MS: moves all extremities without noticeable focal  Chronic changes  Abnormality feet dry no lesions thickened nails no ulcers  PSYCH: pleasant and cooperative, no obvious depression or anxiety Lab Results  Component Value Date   WBC 5.2 03/29/2018   HGB 12.1 03/29/2018   HCT 36.0 03/29/2018   PLT 156.0 03/29/2018   GLUCOSE 130 (H) 03/29/2018   CHOL 151 03/29/2018   TRIG 132.0 03/29/2018   HDL 55.40  03/29/2018   LDLCALC 69 03/29/2018   ALT 22 01/04/2018   AST 22 01/04/2018   NA 140 03/29/2018   K 3.7 03/29/2018   CL 105 03/29/2018   CREATININE 0.98 03/29/2018   BUN 20 03/29/2018   CO2  25 03/29/2018   TSH 2.77 03/29/2018   HGBA1C 6.4 (A) 11/30/2018   MICROALBUR 0.4 08/02/2009   BP Readings from Last 3 Encounters:  11/30/18 134/66  08/02/18 138/60  06/03/18 (!) 143/64   Wt Readings from Last 3 Encounters:  11/30/18 262 lb 3.2 oz (118.9 kg)  08/02/18 260 lb 12.8 oz (118.3 kg)  06/29/18 262 lb (118.8 kg)    ASSESSMENT AND PLAN:  Discussed the following assessment and plan:  Diabetes mellitus type 2 in obese ([place]) - Plan: POCT glycosylated hemoglobin (Hb A1C)  Medication management  Essential hypertension  BMI 45.0-49.9, adult ([place]) stable and controlled DM    Continue  Plan ROV in 4 months   No change in meds  Continue foot care  -Patient advised to return or notify health care team  if  new concerns arise.  Patient Instructions  Glad you are doing well.  Plan    rov in  4 months  Or as needed .     Standley Brooking. Kristof Nadeem M.D.

## 2018-11-30 ENCOUNTER — Other Ambulatory Visit: Payer: Self-pay

## 2018-11-30 ENCOUNTER — Ambulatory Visit (INDEPENDENT_AMBULATORY_CARE_PROVIDER_SITE_OTHER): Payer: Medicare Other | Admitting: Internal Medicine

## 2018-11-30 ENCOUNTER — Encounter: Payer: Self-pay | Admitting: Internal Medicine

## 2018-11-30 VITALS — BP 134/66 | HR 81 | Temp 97.9°F | Wt 262.2 lb

## 2018-11-30 DIAGNOSIS — Z79899 Other long term (current) drug therapy: Secondary | ICD-10-CM | POA: Diagnosis not present

## 2018-11-30 DIAGNOSIS — I10 Essential (primary) hypertension: Secondary | ICD-10-CM

## 2018-11-30 DIAGNOSIS — Z6841 Body Mass Index (BMI) 40.0 and over, adult: Secondary | ICD-10-CM

## 2018-11-30 DIAGNOSIS — E1169 Type 2 diabetes mellitus with other specified complication: Secondary | ICD-10-CM

## 2018-11-30 DIAGNOSIS — E669 Obesity, unspecified: Secondary | ICD-10-CM | POA: Diagnosis not present

## 2018-11-30 LAB — POCT GLYCOSYLATED HEMOGLOBIN (HGB A1C): Hemoglobin A1C: 6.4 % — AB (ref 4.0–5.6)

## 2018-11-30 NOTE — Patient Instructions (Addendum)
Glad you are doing well.  Plan    rov in  4 months  Or as needed .

## 2018-12-02 ENCOUNTER — Other Ambulatory Visit: Payer: Self-pay | Admitting: Internal Medicine

## 2018-12-03 ENCOUNTER — Other Ambulatory Visit: Payer: Self-pay | Admitting: Internal Medicine

## 2018-12-06 ENCOUNTER — Encounter: Payer: Self-pay | Admitting: Pulmonary Disease

## 2018-12-06 ENCOUNTER — Other Ambulatory Visit: Payer: Self-pay

## 2018-12-06 ENCOUNTER — Ambulatory Visit (INDEPENDENT_AMBULATORY_CARE_PROVIDER_SITE_OTHER): Payer: Medicare Other | Admitting: Pulmonary Disease

## 2018-12-06 DIAGNOSIS — Z9989 Dependence on other enabling machines and devices: Secondary | ICD-10-CM

## 2018-12-06 DIAGNOSIS — G4733 Obstructive sleep apnea (adult) (pediatric): Secondary | ICD-10-CM

## 2018-12-06 DIAGNOSIS — I350 Nonrheumatic aortic (valve) stenosis: Secondary | ICD-10-CM | POA: Diagnosis not present

## 2018-12-06 NOTE — Addendum Note (Signed)
Addended by: Hildred Alamin I on: 12/06/2018 11:05 AM   Modules accepted: Orders

## 2018-12-06 NOTE — Progress Notes (Signed)
Subjective:    Patient ID: Rebecca Rich, female    DOB: 05-30-1945, 73 y.o.   MRN: [id number]  HPI  73 year old woman for follow-up of severe OSA  Chief Complaint  Patient presents with  . Follow-up    Patient reports that she is doing well with her CPAP machine and currently does not have any problems.    Reviewed home sleep test and titration study.  We got her new auto BiPAP machine in June and she seems to be settling down with this, likes the full facemask and has gotten rid of her chinstrap.  She really likes the BiPAP machine.  Pressure is not a problem. She reports great improvement in her daytime somnolence and fatigue. Download was reviewed with the patient which shows good control of events with average pressure of 20/15 centimeters with maximum pressure of 22/17 centimeters with mild leak and acceptable compliance about 5 hours a night with a few missed nights  She reports that occasionally she falls asleep in her recliner while reading and forgets to put on her machine at night and she pays for it the next day  Echocardiogram from 2016 was reviewed which shows mild aortic stenosis and mild regurg she has seen Dr. Einar Gip in the past.  She reports left-sided chest/ arm  pain which is attributed to old shoulder injury  Refuses flu shot -has not taken this in the past.  He is using appropriate precautions including social distancing and masking during the pandemic  Significant tests/ events reviewed  06/2018 PAP titration >> was severe and CPAP was inadequate but was corrected by BiPAP 25/20 cm 02/2018 HST >> severe OSA with 76/h and  severe desaturations. 08/1993 NPSG -at [facility] 85/hour corrected by CPAP 10 cm  12/1998-weight 3 1 4  pounds-CPAP titration 17 cm  Past Medical History:  Diagnosis Date  . Acute bronchospasm 08/24/2009  . Acute lower GI bleeding 02/16/2017  . Acute perforated appendicitis w abscess s/p lap appendectomy 12/30/2017 12/30/2017  . Acute  sphenoidal sinusitis 02/26/2010   Qualifier: Diagnosis of  By: Regis Bill MD, Standley Brooking   . CARDIAC MURMUR 12/08/2006  . COLONIC POLYPS, HX OF 12/08/2006  . DIABETES MELLITUS, TYPE II 12/08/2006  . Gallbladder/common duct stone, without infection, with obstruction 03/01/2010   removed ercp  . HYPERLIPIDEMIA 12/08/2006  . HYPERTENSION 12/08/2006  . 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 INFECTION, SKIN AND SOFT TISSUE 07/28/2008  . 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 LIVER FUNCTION TESTS, ABNORMAL 07/28/2008  . Morbid obesity ([place]) 12/08/2006  . OBESITY 09/24/2009  . OBSTRUCTIVE SLEEP APNEA 12/08/2006  . OSTEOARTHRITIS 12/08/2006  . RUQ PAIN 06/16/2008  . SHOULDER PAIN, RIGHT 02/07/2008  . Sleep apnea   . Swelling of limb 07/28/2008  . THYROID FUNCTION TEST, ABNORMAL 12/08/2006     Review of Systems neg for any significant sore throat, dysphagia, itching, sneezing, nasal congestion or excess/ purulent secretions, fever, chills, sweats, unintended wt loss, pleuritic or exertional cp, hempoptysis, orthopnea pnd or change in chronic leg swelling. Also denies presyncope, palpitations, heartburn, abdominal pain, nausea, vomiting, diarrhea or change in bowel or urinary habits, dysuria,hematuria, rash, arthralgias, visual complaints, headache, numbness weakness or ataxia.     Objective:   Physical Exam Gen. Pleasant, obese, in no distress, normal affect ENT - no pallor,icterus, no post nasal drip, class 2-3 airway Neck: No JVD, no thyromegaly, no carotid bruits Lungs: no use of accessory muscles, no dullness to percussion, decreased without rales or rhonchi  Cardiovascular: Rhythm regular, heart sounds  normal, ESM 2/6 at base, no peripheral edema Abdomen: soft and non-tender, no hepatosplenomegaly, BS normal. Musculoskeletal: No deformities, no cyanosis or clubbing Neuro:  alert, non focal, no tremors        Assessment & Plan:

## 2018-12-06 NOTE — Patient Instructions (Signed)
  You are on auto BiPAP and current settings are working well. Supplies will be renewed as needed.  You have narrowing of your aortic valve-schedule repeat echocardiogram and follow-up with Dr. Einar Gip

## 2018-12-06 NOTE — Assessment & Plan Note (Signed)
on auto BiPAP and current settings are working well. Average pressure is 20/15 centimeters with maximum 22/17 centimeters.  She has minimum EPAP setting of 10 cm and will continue this. She has settled down with a FF mask Supplies will be renewed as needed.   Weight loss encouraged, compliance with goal of at least 4-6 hrs every night is the expectation. Advised against medications with sedative side effects Cautioned against driving when sleepy - understanding that sleepiness will vary on a day to day basis

## 2018-12-06 NOTE — Assessment & Plan Note (Signed)
Repeat echo & compare to 2016 She will make FU appt with dr Einar Gip

## 2018-12-11 ENCOUNTER — Other Ambulatory Visit: Payer: Self-pay | Admitting: Internal Medicine

## 2018-12-12 ENCOUNTER — Other Ambulatory Visit: Payer: Self-pay | Admitting: Physician Assistant

## 2018-12-15 ENCOUNTER — Ambulatory Visit (HOSPITAL_COMMUNITY): Payer: Medicare Other | Attending: Cardiovascular Disease

## 2018-12-15 ENCOUNTER — Other Ambulatory Visit: Payer: Self-pay

## 2018-12-15 DIAGNOSIS — I350 Nonrheumatic aortic (valve) stenosis: Secondary | ICD-10-CM | POA: Insufficient documentation

## 2018-12-15 MED ORDER — PERFLUTREN LIPID MICROSPHERE
1.0000 mL | INTRAVENOUS | Status: AC | PRN
  Administered 2018-12-15: 2 mL via INTRAVENOUS

## 2018-12-31 ENCOUNTER — Other Ambulatory Visit: Payer: Self-pay

## 2018-12-31 ENCOUNTER — Encounter

## 2018-12-31 ENCOUNTER — Encounter: Payer: Self-pay | Admitting: Cardiology

## 2018-12-31 ENCOUNTER — Ambulatory Visit (INDEPENDENT_AMBULATORY_CARE_PROVIDER_SITE_OTHER): Payer: Medicare Other | Admitting: Cardiology

## 2018-12-31 VITALS — BP 132/68 | HR 77 | Ht 61.0 in | Wt 276.0 lb

## 2018-12-31 DIAGNOSIS — I10 Essential (primary) hypertension: Secondary | ICD-10-CM | POA: Diagnosis not present

## 2018-12-31 DIAGNOSIS — I350 Nonrheumatic aortic (valve) stenosis: Secondary | ICD-10-CM | POA: Diagnosis not present

## 2018-12-31 DIAGNOSIS — I471 Supraventricular tachycardia: Secondary | ICD-10-CM

## 2018-12-31 NOTE — Progress Notes (Signed)
Cardiology Office Note:    Date:  12/31/2018   ID:  Rebecca Rich, DOB 1945-03-23, MRN [id number]  PCP:  Burnis Medin, MD  Cardiologist:  Dorris Carnes, MD  Referring MD: Burnis Medin, MD   Chief Complaint  Patient presents with  . Follow-up  . Aortic Stenosis    History of Present Illness:    Rebecca Rich is a 73 y.o. female with a past medical history significant for hypertension, idiopathic cardiomegaly, mild aortic stenosis, SVT, OSA on CPAP, obesity, type 2 diabetes, hyperlipidemia and gout.  She was admitted to the hospital in December 2019 for appendicitis.  She had a couple of brief runs of SVT and perioperative period.  Echocardiogram showed normal LV systolic function with very mild aortic stenosis.  She had mild elevated troponin in the hospital.  Follow-up Myoview on 03/23/2018 was negative for ischemia.  She had a follow-up echocardiogram ordered by pulmonology that showed normal EF [id number], grade 2 diastolic dysfunction and mild aortic regurgitation and mild aortic stenosis.  Mean aortic valve gradient 13 mmHg.  This is relatively stable from previous.  Previously patient of Dr. Einar Gip, last was about 3 years ago when she had gallbladder surgery when she switched to our practice.   She is here today alone for follow up. She lives alone and does her housework, laundry, walks to the dumpster to take trash and walks to the office to get mail. Her biggest issue is arthritis pain in her right knee. She is trying to lose wt, 20 pounds. She denies chest pain, pressure. She has some mild DOE when walking about 3000-4000 steps, past the dumpster, but none during regular activity. She denies orthopnea, PND, swelling, palpitations, lightheadedness or syncope.   Home BPs usually in the 120's.    Cardiac studies   Echocardiogram 12/15/2018 IMPRESSIONS    1. Left ventricular ejection fraction, by visual estimation, is 60 to 65%. The left ventricle has normal function. There is  moderately increased left ventricular hypertrophy.  2. Left ventricular diastolic parameters are consistent with Grade II diastolic dysfunction (pseudonormalization).  3. Global right ventricle has normal systolic function.The right ventricular size is normal. No increase in right ventricular wall thickness.  4. Left atrial size was mildly dilated.  5. Right atrial size was normal.  6. Moderate calcification of the mitral valve leaflet(s).  7. Moderate thickening of the mitral valve leaflet(s).  8. The mitral valve is normal in structure. Trace mitral valve regurgitation.  9. The tricuspid valve is normal in structure. Tricuspid valve regurgitation is mild. 10. The aortic valve has an indeterminant number of cusps. Aortic valve regurgitation is mild. Mild aortic valve stenosis. 11. The pulmonic valve was grossly normal. Pulmonic valve regurgitation is mild.   Past Medical History:  Diagnosis Date  . Acute bronchospasm 08/24/2009  . Acute lower GI bleeding 02/16/2017  . Acute perforated appendicitis w abscess s/p lap appendectomy 12/30/2017 12/30/2017  . Acute sphenoidal sinusitis 02/26/2010   Qualifier: Diagnosis of  By: Regis Bill MD, Standley Brooking   . CARDIAC MURMUR 12/08/2006  . COLONIC POLYPS, HX OF 12/08/2006  . DIABETES MELLITUS, TYPE II 12/08/2006  . Gallbladder/common duct stone, without infection, with obstruction 03/01/2010   removed ercp  . HYPERLIPIDEMIA 12/08/2006  . HYPERTENSION 12/08/2006  . 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 INFECTION, SKIN AND SOFT TISSUE 07/28/2008  . 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 LIVER FUNCTION TESTS, ABNORMAL 07/28/2008  . Morbid obesity ([place]) 12/08/2006  . OBESITY 09/24/2009  . OBSTRUCTIVE SLEEP  APNEA 12/08/2006  . OSTEOARTHRITIS 12/08/2006  . RUQ PAIN 06/16/2008  . SHOULDER PAIN, RIGHT 02/07/2008  . Sleep apnea   . Swelling of limb 07/28/2008  . THYROID FUNCTION TEST, ABNORMAL 12/08/2006    Past Surgical History:  Procedure Laterality Date  . ABDOMINAL HYSTERECTOMY     . CHOLECYSTECTOMY  06/27/2008  . COLONOSCOPY N/A 12/13/2012   Procedure: COLONOSCOPY;  Surgeon: Juanita Craver, MD;  Location: [facility];  Service: Endoscopy;  Laterality: N/A;  . COLONOSCOPY Left 02/17/2017   Procedure: COLONOSCOPY;  Surgeon: Carol Ada, MD;  Location: [facility];  Service: Endoscopy;  Laterality: Left;  . common duct stone     ERCP   . LAPAROSCOPIC APPENDECTOMY N/A 12/30/2017   Procedure: APPENDECTOMY LAPAROSCOPIC;  Surgeon: Stark Klein, MD;  Location: [facility];  Service: General;  Laterality: N/A;    Current Medications: Current Meds  Medication Sig  . amLODipine (NORVASC) 5 MG tablet TAKE 1 TABLET BY MOUTH EVERY DAY  . chlorthalidone (HYGROTON) 25 MG tablet TAKE 1 TABLET BY MOUTH EVERY DAY  . cholecalciferol (VITAMIN D) 1000 UNITS tablet Take 1,000 Units by mouth daily.  Marland Kitchen glucose blood (ACCU-CHEK AVIVA PLUS) test strip Use to check blood sugar daily E11.9  . Lancets (ACCU-CHEK SOFT TOUCH) lancets Use to test blood glucose twice daily  . levothyroxine (SYNTHROID) 75 MCG tablet TAKE 1 TABLET (75 MCG TOTAL) BY MOUTH DAILY BEFORE BREAKFAST.  Marland Kitchen losartan (COZAAR) 100 MG tablet TAKE 1 TABLET (100 MG TOTAL) BY MOUTH DAILY. MAY BEGIN AT 1/2 TABLET DAILY AND INCREASE TO 1 TABLET  . metoprolol succinate (TOPROL-XL) 25 MG 24 hr tablet TAKE 1 TABLET BY MOUTH EVERY DAY  . Misc Natural Products (OSTEO BI-FLEX ADV JOINT SHIELD PO) Take 1 tablet by mouth daily.  . Multiple Vitamins-Minerals (CENTRUM SILVER PO) Take 1 tablet by mouth daily.    . potassium chloride (KLOR-CON) 10 MEQ tablet TAKE 1 TABLET (10 MEQ TOTAL) BY MOUTH 3 (THREE) TIMES DAILY.     Allergies:   Patient has no known allergies.   Social History   Socioeconomic History  . Marital status: Divorced    Spouse name: Not on file  . Number of children: Not on file  . Years of education: Not on file  . Highest education level: Not on file  Occupational History  . Occupation: retired    Fish farm manager: RETIRED   Social Needs  . Financial resource strain: Not on file  . Food insecurity    Worry: Not on file    Inability: Not on file  . Transportation needs    Medical: Not on file    Non-medical: Not on file  Tobacco Use  . Smoking status: Never Smoker  . Smokeless tobacco: Never Used  Substance and Sexual Activity  . Alcohol use: No    Alcohol/week: 0.0 standard drinks  . Drug use: No  . Sexual activity: Not on file  Lifestyle  . Physical activity    Days per week: Not on file    Minutes per session: Not on file  . Stress: Not on file  Relationships  . Social Herbalist on phone: Not on file    Gets together: Not on file    Attends religious service: Not on file    Active member of club or organization: Not on file    Attends meetings of clubs or organizations: Not on file    Relationship status: Not on file  Other Topics Concern  . Not  on file  Social History Narrative   Master level education in math   Pt is currently retired   Pt is divorced with children   Recently had to move had a break in and thus away from her pool exercise     Family History: The patient's family history includes Cancer in her father and mother; Cervical cancer in her mother; Diabetes in her brother, brother, brother, sister, sister, and another family member; Heart disease in her brother, brother, brother, and sister; Other in her mother; Stroke in her sister; Throat cancer in her father. ROS:   Please see the history of present illness.     All other systems reviewed and are negative.   EKG:  EKG is not ordered today.    Recent Labs: 01/04/2018: ALT 22 01/06/2018: Magnesium 2.0 03/29/2018: BUN 20; Creatinine, Ser 0.98; Hemoglobin 12.1; Platelets 156.0; Potassium 3.7; Sodium 140; TSH 2.77   Recent Lipid Panel    Component Value Date/Time   CHOL 151 03/29/2018 0843   TRIG 132.0 03/29/2018 0843   HDL 55.40 03/29/2018 0843   CHOLHDL 3 03/29/2018 0843   VLDL 26.4 03/29/2018 0843    LDLCALC 69 03/29/2018 0843    Physical Exam:    VS:  BP 132/68   Pulse 77   Ht 5\' 1"  (1.549 m)   Wt 276 lb (125.2 kg)   SpO2 97%   BMI 52.15 kg/m     Wt Readings from Last 6 Encounters:  12/31/18 276 lb (125.2 kg)  12/06/18 267 lb (121.1 kg)  11/30/18 262 lb 3.2 oz (118.9 kg)  08/02/18 260 lb 12.8 oz (118.3 kg)  06/29/18 262 lb (118.8 kg)  06/03/18 261 lb (118.4 kg)     Physical Exam  Constitutional: She is oriented to person, place, and time. She appears well-developed and well-nourished.  HENT:  Head: Normocephalic and atraumatic.  Neck: Normal range of motion. Neck supple. No JVD present.  Cardiovascular: Normal rate, regular rhythm and intact distal pulses. Exam reveals no gallop and no friction rub.  Murmur heard.  Harsh midsystolic murmur is present with a grade of 2/6 at the upper right sternal border and upper left sternal border radiating to the neck and apex. Pulmonary/Chest: Effort normal and breath sounds normal. No respiratory distress. She has no wheezes. She has no rales.  Abdominal: Soft. Bowel sounds are normal.  Musculoskeletal: Normal range of motion.        General: No edema.  Neurological: She is alert and oriented to person, place, and time.  Skin: Skin is warm and dry.  Psychiatric: She has a normal mood and affect. Her behavior is normal. Judgment and thought content normal.  Vitals reviewed.    ASSESSMENT:    1. SVT (supraventricular tachycardia) ([place])   2. Essential (primary) hypertension   3. Aortic stenosis, mild    PLAN:    In order of problems listed above:  SVT -Short episode in the hospital surrounding appendix surgery in 12/2017.  No recurrences.  Essential hypertension -On amlodipine 5 mg, losartan 100 mg, metoprolol succinate 25 mg, Chlorthalidone. -BP well controlled. Continue current meds.  -Labs per PCP.  Aortic stenosis -Patient noted to have mild AR and AS.  Recent echocardiogram 12/15/2018 with aortic valve gradient  of 13 mmHg which is relatively stable from previous 10 mmHg by echo in 12/2017. Pt states that she has had a heart murmur as long as she can remember. -Asymptomatic  -Continue to monitor -Will follow up in a year.  Medication Adjustments/Labs and Tests Ordered: Current medicines are reviewed at length with the patient today.  Concerns regarding medicines are outlined above. Labs and tests ordered and medication changes are outlined in the patient instructions below:  Patient Instructions  Medication Instructions:  Your physician recommends that you continue on your current medications as directed. Please refer to the Current Medication list given to you today.  *If you need a refill on your cardiac medications before your next appointment, please call your pharmacy*  Lab Work: None   If you have labs (blood work) drawn today and your tests are completely normal, you will receive your results only by: Marland Kitchen MyChart Message (if you have MyChart) OR . A paper copy in the mail If you have any lab test that is abnormal or we need to change your treatment, we will call you to review the results.  Testing/Procedures: None   Follow-Up: At [facility], you and your health needs are our priority.  As part of our continuing mission to provide you with exceptional heart care, we have created designated Provider Care Teams.  These Care Teams include your primary Cardiologist (physician) and Advanced Practice Providers (APPs -  Physician Assistants and Nurse Practitioners) who all work together to provide you with the care you need, when you need it.  Your next appointment:   12 month(s)  The format for your next appointment:   In Person  Provider:   You may see Dorris Carnes, MD or one of the following Advanced Practice Providers on your designated Care Team:    Richardson Dopp, PA-C  [place], [place]  Daune Perch, NP   Other Instructions  Lifestyle Modifications to Prevent and Treat  Heart Disease -Recommend heart healthy/Mediterranean diet, with whole grains, fruits, vegetables, fish, lean meats, nuts, olive oil and avocado oil.  -Limit salt intake to less than 2000 mg per day.  -Recommend moderate walking, starting slowly with a few minutes and working up to 3-5 times/week for 30-50 minutes each session. Aim for at least 150 minutes.week. Goal should be pace of 3 miles/hours, or walking 1.5 miles in 30 minutes -Recommend avoidance of tobacco products. Avoid excess alcohol. -Keep blood pressure well controlled, ideally less than 130/80.  -------------------------------------------------------------------   Mediterranean Diet A Mediterranean diet refers to food and lifestyle choices that are based on the traditions of countries located on the [employer]. This way of eating has been shown to help prevent certain conditions and improve outcomes for people who have chronic diseases, like kidney disease and heart disease. What are tips for following this plan? Lifestyle  Cook and eat meals together with your family, when possible.  Drink enough fluid to keep your urine clear or pale yellow.  Be physically active every day. This includes: ? Aerobic exercise like running or swimming. ? Leisure activities like gardening, walking, or housework.  Get 7-8 hours of sleep each night.  If recommended by your health care provider, drink red wine in moderation. This means 1 glass a day for nonpregnant women and 2 glasses a day for men. A glass of wine equals 5 oz (150 mL). Reading food labels   Check the serving size of packaged foods. For foods such as rice and pasta, the serving size refers to the amount of cooked product, not dry.  Check the total fat in packaged foods. Avoid foods that have saturated fat or trans fats.  Check the ingredients list for added sugars, such as corn syrup. Shopping  At  the grocery store, buy most of your food from the areas near the  walls of the store. This includes: ? Fresh fruits and vegetables (produce). ? Grains, beans, nuts, and seeds. Some of these may be available in unpackaged forms or large amounts (in bulk). ? Fresh seafood. ? Poultry and eggs. ? Low-fat dairy products.  Buy whole ingredients instead of prepackaged foods.  Buy fresh fruits and vegetables in-season from local farmers markets.  Buy frozen fruits and vegetables in resealable bags.  If you do not have access to quality fresh seafood, buy precooked frozen shrimp or canned fish, such as tuna, salmon, or sardines.  Buy small amounts of raw or cooked vegetables, salads, or olives from the deli or salad bar at your store.  Stock your pantry so you always have certain foods on hand, such as olive oil, canned tuna, canned tomatoes, rice, pasta, and beans. Cooking  Cook foods with extra-virgin olive oil instead of using butter or other vegetable oils.  Have meat as a side dish, and have vegetables or grains as your main dish. This means having meat in small portions or adding small amounts of meat to foods like pasta or stew.  Use beans or vegetables instead of meat in common dishes like chili or lasagna.  Experiment with different cooking methods. Try roasting or broiling vegetables instead of steaming or sauteing them.  Add frozen vegetables to soups, stews, pasta, or rice.  Add nuts or seeds for added healthy fat at each meal. You can add these to yogurt, salads, or vegetable dishes.  Marinate fish or vegetables using olive oil, lemon juice, garlic, and fresh herbs. Meal planning   Plan to eat 1 vegetarian meal one day each week. Try to work up to 2 vegetarian meals, if possible.  Eat seafood 2 or more times a week.  Have healthy snacks readily available, such as: ? Vegetable sticks with hummus. ? [place] yogurt. ? Fruit and nut trail mix.  Eat balanced meals throughout the week. This includes: ? Fruit: 2-3 servings a day ?  Vegetables: 4-5 servings a day ? Low-fat dairy: 2 servings a day ? Fish, poultry, or lean meat: 1 serving a day ? Beans and legumes: 2 or more servings a week ? Nuts and seeds: 1-2 servings a day ? Whole grains: 6-8 servings a day ? Extra-virgin olive oil: 3-4 servings a day  Limit red meat and sweets to only a few servings a month What are my food choices?  Mediterranean diet ? Recommended  Grains: Whole-grain pasta. Brown rice. Bulgar wheat. Polenta. Couscous. Whole-wheat bread. [place].  Vegetables: Artichokes. Beets. Broccoli. Cabbage. Carrots. Eggplant. Green beans. Chard. Kale. Spinach. Onions. Leeks. Peas. Squash. Tomatoes. Peppers. Radishes.  Fruits: Apples. Apricots. Avocado. Berries. Bananas. Cherries. Dates. Figs. Grapes. Lemons. Melon. Oranges. Peaches. Plums. Pomegranate.  Meats and other protein foods: Beans. Almonds. Sunflower seeds. Pine nuts. Peanuts. [place]. Salmon. Scallops. Shrimp. [place]. Tilapia. Clams. Oysters. Eggs.  Dairy: Low-fat milk. Cheese. Greek yogurt.  Beverages: Water. Red wine. Herbal tea.  Fats and oils: Extra virgin olive oil. Avocado oil. Grape seed oil.  Sweets and desserts: [place] yogurt with honey. Baked apples. Poached pears. Trail mix.  Seasoning and other foods: Basil. Cilantro. Coriander. Cumin. Mint. Parsley. Sage. Rosemary. Tarragon. Garlic. Oregano. Thyme. Pepper. Balsalmic vinegar. Tahini. Hummus. Tomato sauce. Olives. Mushrooms. ? Limit these  Grains: Prepackaged pasta or rice dishes. Prepackaged cereal with added sugar.  Vegetables: Deep fried potatoes (french fries).  Fruits: Fruit canned in  syrup.  Meats and other protein foods: Beef. Pork. Lamb. Poultry with skin. Hot dogs. [place] Salines.  Dairy: Ice cream. Sour cream. Whole milk.  Beverages: Juice. Sugar-sweetened soft drinks. Beer. Liquor and spirits.  Fats and oils: Butter. [place]ola oil. Vegetable oil. Beef fat (tallow). Lard.  Sweets and desserts: Cookies. Cakes. Pies.  Candy.  Seasoning and other foods: Mayonnaise. Premade sauces and marinades. The items listed may not be a complete list. Talk with your dietitian about what dietary choices are right for you. Summary  The Mediterranean diet includes both food and lifestyle choices.  Eat a variety of fresh fruits and vegetables, beans, nuts, seeds, and whole grains.  Limit the amount of red meat and sweets that you eat.  Talk with your health care provider about whether it is safe for you to drink red wine in moderation. This means 1 glass a day for nonpregnant women and 2 glasses a day for men. A glass of wine equals 5 oz (150 mL). This information is not intended to replace advice given to you by your health care provider. Make sure you discuss any questions you have with your health care provider. Document Released: 09/06/2015 Document Revised: 09/13/2015 Document Reviewed: 09/06/2015 Elsevier Patient Education  2020 [place], Daune Perch, NP  12/31/2018 6:26 PM    [place]

## 2018-12-31 NOTE — Patient Instructions (Addendum)
Medication Instructions:  Your physician recommends that you continue on your current medications as directed. Please refer to the Current Medication list given to you today.  *If you need a refill on your cardiac medications before your next appointment, please call your pharmacy*  Lab Work: None   If you have labs (blood work) drawn today and your tests are completely normal, you will receive your results only by: Marland Kitchen MyChart Message (if you have MyChart) OR . A paper copy in the mail If you have any lab test that is abnormal or we need to change your treatment, we will call you to review the results.  Testing/Procedures: None   Follow-Up: At [facility], you and your health needs are our priority.  As part of our continuing mission to provide you with exceptional heart care, we have created designated Provider Care Teams.  These Care Teams include your primary Cardiologist (physician) and Advanced Practice Providers (APPs -  Physician Assistants and Nurse Practitioners) who all work together to provide you with the care you need, when you need it.  Your next appointment:   12 month(s)  The format for your next appointment:   In Person  Provider:   You may see Dorris Carnes, MD or one of the following Advanced Practice Providers on your designated Care Team:    Richardson Dopp, PA-C  [place], [place]  Daune Perch, NP   Other Instructions  Lifestyle Modifications to Prevent and Treat Heart Disease -Recommend heart healthy/Mediterranean diet, with whole grains, fruits, vegetables, fish, lean meats, nuts, olive oil and avocado oil.  -Limit salt intake to less than 2000 mg per day.  -Recommend moderate walking, starting slowly with a few minutes and working up to 3-5 times/week for 30-50 minutes each session. Aim for at least 150 minutes.week. Goal should be pace of 3 miles/hours, or walking 1.5 miles in 30 minutes -Recommend avoidance of tobacco products. Avoid excess alcohol.  -Keep blood pressure well controlled, ideally less than 130/80.  -------------------------------------------------------------------   Mediterranean Diet A Mediterranean diet refers to food and lifestyle choices that are based on the traditions of countries located on the [employer]. This way of eating has been shown to help prevent certain conditions and improve outcomes for people who have chronic diseases, like kidney disease and heart disease. What are tips for following this plan? Lifestyle  Cook and eat meals together with your family, when possible.  Drink enough fluid to keep your urine clear or pale yellow.  Be physically active every day. This includes: ? Aerobic exercise like running or swimming. ? Leisure activities like gardening, walking, or housework.  Get 7-8 hours of sleep each night.  If recommended by your health care provider, drink red wine in moderation. This means 1 glass a day for nonpregnant women and 2 glasses a day for men. A glass of wine equals 5 oz (150 mL). Reading food labels   Check the serving size of packaged foods. For foods such as rice and pasta, the serving size refers to the amount of cooked product, not dry.  Check the total fat in packaged foods. Avoid foods that have saturated fat or trans fats.  Check the ingredients list for added sugars, such as corn syrup. Shopping  At the grocery store, buy most of your food from the areas near the walls of the store. This includes: ? Fresh fruits and vegetables (produce). ? Grains, beans, nuts, and seeds. Some of these may be available in unpackaged forms  or large amounts (in bulk). ? Fresh seafood. ? Poultry and eggs. ? Low-fat dairy products.  Buy whole ingredients instead of prepackaged foods.  Buy fresh fruits and vegetables in-season from local farmers markets.  Buy frozen fruits and vegetables in resealable bags.  If you do not have access to quality fresh seafood, buy precooked  frozen shrimp or canned fish, such as tuna, salmon, or sardines.  Buy small amounts of raw or cooked vegetables, salads, or olives from the deli or salad bar at your store.  Stock your pantry so you always have certain foods on hand, such as olive oil, canned tuna, canned tomatoes, rice, pasta, and beans. Cooking  Cook foods with extra-virgin olive oil instead of using butter or other vegetable oils.  Have meat as a side dish, and have vegetables or grains as your main dish. This means having meat in small portions or adding small amounts of meat to foods like pasta or stew.  Use beans or vegetables instead of meat in common dishes like chili or lasagna.  Experiment with different cooking methods. Try roasting or broiling vegetables instead of steaming or sauteing them.  Add frozen vegetables to soups, stews, pasta, or rice.  Add nuts or seeds for added healthy fat at each meal. You can add these to yogurt, salads, or vegetable dishes.  Marinate fish or vegetables using olive oil, lemon juice, garlic, and fresh herbs. Meal planning   Plan to eat 1 vegetarian meal one day each week. Try to work up to 2 vegetarian meals, if possible.  Eat seafood 2 or more times a week.  Have healthy snacks readily available, such as: ? Vegetable sticks with hummus. ? [place] yogurt. ? Fruit and nut trail mix.  Eat balanced meals throughout the week. This includes: ? Fruit: 2-3 servings a day ? Vegetables: 4-5 servings a day ? Low-fat dairy: 2 servings a day ? Fish, poultry, or lean meat: 1 serving a day ? Beans and legumes: 2 or more servings a week ? Nuts and seeds: 1-2 servings a day ? Whole grains: 6-8 servings a day ? Extra-virgin olive oil: 3-4 servings a day  Limit red meat and sweets to only a few servings a month What are my food choices?  Mediterranean diet ? Recommended  Grains: Whole-grain pasta. Brown rice. Bulgar wheat. Polenta. Couscous. Whole-wheat bread. [place].   Vegetables: Artichokes. Beets. Broccoli. Cabbage. Carrots. Eggplant. Green beans. Chard. Kale. Spinach. Onions. Leeks. Peas. Squash. Tomatoes. Peppers. Radishes.  Fruits: Apples. Apricots. Avocado. Berries. Bananas. Cherries. Dates. Figs. Grapes. Lemons. Melon. Oranges. Peaches. Plums. Pomegranate.  Meats and other protein foods: Beans. Almonds. Sunflower seeds. Pine nuts. Peanuts. [place]. Salmon. Scallops. Shrimp. [place]. Tilapia. Clams. Oysters. Eggs.  Dairy: Low-fat milk. Cheese. Greek yogurt.  Beverages: Water. Red wine. Herbal tea.  Fats and oils: Extra virgin olive oil. Avocado oil. Grape seed oil.  Sweets and desserts: [place] yogurt with honey. Baked apples. Poached pears. Trail mix.  Seasoning and other foods: Basil. Cilantro. Coriander. Cumin. Mint. Parsley. Sage. Rosemary. Tarragon. Garlic. Oregano. Thyme. Pepper. Balsalmic vinegar. Tahini. Hummus. Tomato sauce. Olives. Mushrooms. ? Limit these  Grains: Prepackaged pasta or rice dishes. Prepackaged cereal with added sugar.  Vegetables: Deep fried potatoes (french fries).  Fruits: Fruit canned in syrup.  Meats and other protein foods: Beef. Pork. Lamb. Poultry with skin. Hot dogs. Berniece Salines.  Dairy: Ice cream. Sour cream. Whole milk.  Beverages: Juice. Sugar-sweetened soft drinks. Beer. Liquor and spirits.  Fats and oils: Butter. Canola  oil. Vegetable oil. Beef fat (tallow). Lard.  Sweets and desserts: Cookies. Cakes. Pies. Candy.  Seasoning and other foods: Mayonnaise. Premade sauces and marinades. The items listed may not be a complete list. Talk with your dietitian about what dietary choices are right for you. Summary  The Mediterranean diet includes both food and lifestyle choices.  Eat a variety of fresh fruits and vegetables, beans, nuts, seeds, and whole grains.  Limit the amount of red meat and sweets that you eat.  Talk with your health care provider about whether it is safe for you to drink red wine in  moderation. This means 1 glass a day for nonpregnant women and 2 glasses a day for men. A glass of wine equals 5 oz (150 mL). This information is not intended to replace advice given to you by your health care provider. Make sure you discuss any questions you have with your health care provider. Document Released: 09/06/2015 Document Revised: 09/13/2015 Document Reviewed: 09/06/2015 Elsevier Patient Education  2020 [employer].

## 2019-01-11 ENCOUNTER — Encounter: Payer: Self-pay | Admitting: Sports Medicine

## 2019-01-11 ENCOUNTER — Ambulatory Visit (INDEPENDENT_AMBULATORY_CARE_PROVIDER_SITE_OTHER): Payer: Medicare Other | Admitting: Sports Medicine

## 2019-01-11 ENCOUNTER — Other Ambulatory Visit: Payer: Self-pay

## 2019-01-11 DIAGNOSIS — M79676 Pain in unspecified toe(s): Secondary | ICD-10-CM | POA: Diagnosis not present

## 2019-01-11 DIAGNOSIS — B351 Tinea unguium: Secondary | ICD-10-CM | POA: Diagnosis not present

## 2019-01-11 DIAGNOSIS — E119 Type 2 diabetes mellitus without complications: Secondary | ICD-10-CM

## 2019-01-11 DIAGNOSIS — M79609 Pain in unspecified limb: Secondary | ICD-10-CM

## 2019-01-11 NOTE — Progress Notes (Signed)
Subjective: Rebecca Rich is a 73 y.o. female patient with history of diabetes who returns to office today complaining of long,mildly painful nails  while ambulating in shoes; unable to trim. Patient denies any new cramping, numbness, burning or tingling in the legs but did have 1 episode of pain at right arch that is now better went away after a week on its own. No other pedal complaints noted.   Reports that A1c 6.6 and FBS this AM was 140.  Patient Active Problem List   Diagnosis Date Noted  . Numbness and tingling of left leg 04/05/2018  . Influenza vaccination declined by patient 04/05/2018  . SVT (supraventricular tachycardia) ([place]) 12/31/2017  . Diverticulosis of intestine with bleeding   . Rectal bleeding   . Hx of gout 08/21/2015  . Mild aortic stenosis 08/21/2015  . Muscle spasm 03/22/2013  . Skin lesion of back 09/13/2012  . Left shoulder pain 01/20/2012  . Hypothyroid 05/31/2011  . Elevated uric acid in blood 05/31/2011  . Palmar nodule 03/25/2011  . Abnormal urine 02/20/2011  . 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 SHOULDER PAIN, RIGHT 02/07/2008  . Diabetes mellitus type 2 in obese ([place]) 12/08/2006  . HYPERLIPIDEMIA 12/08/2006  . Morbid obesity with BMI of 50.0-59.9, adult ([place]) 12/08/2006  . OSA on CPAP 12/08/2006  . Essential hypertension 12/08/2006  . OSTEOARTHRITIS 12/08/2006  . THYROID FUNCTION TEST, ABNORMAL 12/08/2006  . COLONIC POLYPS, HX OF 12/08/2006   Current Outpatient Medications on File Prior to Visit  Medication Sig Dispense Refill  . amLODipine (NORVASC) 5 MG tablet TAKE 1 TABLET BY MOUTH EVERY DAY 90 tablet 0  . chlorthalidone (HYGROTON) 25 MG tablet TAKE 1 TABLET BY MOUTH EVERY DAY 90 tablet 1  . cholecalciferol (VITAMIN D) 1000 UNITS tablet Take 1,000 Units by mouth daily.    Marland Kitchen glucose blood (ACCU-CHEK AVIVA PLUS) test strip Use to check blood sugar daily E11.9 100 each 12  . Lancets (ACCU-CHEK SOFT TOUCH) lancets Use to  test blood glucose twice daily 50 each 12  . levothyroxine (SYNTHROID) 75 MCG tablet TAKE 1 TABLET (75 MCG TOTAL) BY MOUTH DAILY BEFORE BREAKFAST. 90 tablet 0  . losartan (COZAAR) 100 MG tablet TAKE 1 TABLET (100 MG TOTAL) BY MOUTH DAILY. MAY BEGIN AT 1/2 TABLET DAILY AND INCREASE TO 1 TABLET 90 tablet 1  . metoprolol succinate (TOPROL-XL) 25 MG 24 hr tablet TAKE 1 TABLET BY MOUTH EVERY DAY 90 tablet 2  . Misc Natural Products (OSTEO BI-FLEX ADV JOINT SHIELD PO) Take 1 tablet by mouth daily.    . Multiple Vitamins-Minerals (CENTRUM SILVER PO) Take 1 tablet by mouth daily.      . potassium chloride (KLOR-CON) 10 MEQ tablet TAKE 1 TABLET (10 MEQ TOTAL) BY MOUTH 3 (THREE) TIMES DAILY. 90 tablet 5   No current facility-administered medications on file prior to visit.   No Known Allergies  Recent Results (from the past 2160 hour(s))  POCT glycosylated hemoglobin (Hb A1C)     Status: Abnormal   Collection Time: 11/30/18  9:11 AM  Result Value Ref Range   Hemoglobin A1C 6.4 (A) 4.0 - 5.6 %   HbA1c POC (<> result, manual entry)     HbA1c, POC (prediabetic range)     HbA1c, POC (controlled diabetic range)      Objective: General: Patient is awake, alert, and oriented x 3 and in no acute distress.  Integument: Skin is warm, dry and supple bilateral. Nails are tender, long,  thickened and  dystrophic with subungual debris, consistent with onychomycosis, 1-5 bilateral. No acute ingrowing on left great toe. No signs of infection. No open lesions or preulcerative lesions present bilateral. Remaining integument unremarkable.  Vasculature:  Dorsalis Pedis pulse 2/4 bilateral. Posterior Tibial pulse  1/4 bilateral.  Capillary fill time <3 sec 1-5 bilateral. Positive hair growth to the level of the digits. Temperature gradient within normal limits. No varicosities present bilateral. No edema present bilateral.   Neurology: The patient has intact sensation measured with a 5.07/10g Semmes Weinstein  Monofilament at all pedal sites bilateral . Vibratory sensation diminished bilateral with tuning fork. No Babinski sign present bilateral.   Musculoskeletal: No reproducible pain on right arch. Asymptomatic pes pedal deformities noted bilateral. Muscular strength 5/5 in all lower extremity muscular groups bilateral without pain on range of motion . No tenderness with calf compression bilateral.  Assessment and Plan: Problem List Items Addressed This Visit    None    Visit Diagnoses    Pain due to onychomycosis of nail    -  Primary   Diabetes mellitus without complication ([place])          -Examined patient. -Re-Discussed and educated patient on diabetic foot care, especially with  regards to the vascular, neurological and musculoskeletal systems.  -Mechanically debrided all nails 1-5 bilateral using sterile nail nipper and filed with dremel without incident  -Answered all patient questions -Patient to return  in 3 months for at risk foot care -Patient advised to call the office if any problems or questions arise in the meantime.  Landis Martins, DPM

## 2019-01-17 ENCOUNTER — Other Ambulatory Visit: Payer: Self-pay | Admitting: Internal Medicine

## 2019-03-31 ENCOUNTER — Other Ambulatory Visit: Payer: Self-pay

## 2019-04-01 ENCOUNTER — Encounter: Payer: Self-pay | Admitting: Internal Medicine

## 2019-04-01 ENCOUNTER — Ambulatory Visit (INDEPENDENT_AMBULATORY_CARE_PROVIDER_SITE_OTHER): Payer: Medicare HMO | Admitting: Internal Medicine

## 2019-04-01 VITALS — BP 136/70 | HR 76 | Temp 97.6°F | Wt 267.4 lb

## 2019-04-01 DIAGNOSIS — E039 Hypothyroidism, unspecified: Secondary | ICD-10-CM

## 2019-04-01 DIAGNOSIS — E1169 Type 2 diabetes mellitus with other specified complication: Secondary | ICD-10-CM

## 2019-04-01 DIAGNOSIS — I10 Essential (primary) hypertension: Secondary | ICD-10-CM | POA: Diagnosis not present

## 2019-04-01 DIAGNOSIS — E669 Obesity, unspecified: Secondary | ICD-10-CM | POA: Diagnosis not present

## 2019-04-01 DIAGNOSIS — Z79899 Other long term (current) drug therapy: Secondary | ICD-10-CM

## 2019-04-01 DIAGNOSIS — E782 Mixed hyperlipidemia: Secondary | ICD-10-CM

## 2019-04-01 LAB — CBC WITH DIFFERENTIAL/PLATELET
Basophils Absolute: 0 10*3/uL (ref 0.0–0.1)
Basophils Relative: 0.4 % (ref 0.0–3.0)
Eosinophils Absolute: 0.1 10*3/uL (ref 0.0–0.7)
Eosinophils Relative: 2.6 % (ref 0.0–5.0)
HCT: 38.7 % (ref 36.0–46.0)
Hemoglobin: 13.1 g/dL (ref 12.0–15.0)
Lymphocytes Relative: 30.3 % (ref 12.0–46.0)
Lymphs Abs: 1.7 10*3/uL (ref 0.7–4.0)
MCHC: 33.8 g/dL (ref 30.0–36.0)
MCV: 88.6 fl (ref 78.0–100.0)
Monocytes Absolute: 0.4 10*3/uL (ref 0.1–1.0)
Monocytes Relative: 7.7 % (ref 3.0–12.0)
Neutro Abs: 3.4 10*3/uL (ref 1.4–7.7)
Neutrophils Relative %: 59 % (ref 43.0–77.0)
Platelets: 190 10*3/uL (ref 150.0–400.0)
RBC: 4.38 Mil/uL (ref 3.87–5.11)
RDW: 14 % (ref 11.5–15.5)
WBC: 5.7 10*3/uL (ref 4.0–10.5)

## 2019-04-01 LAB — LIPID PANEL
Cholesterol: 166 mg/dL (ref 0–200)
HDL: 51.9 mg/dL (ref >39.00)
LDL Cholesterol: 88 mg/dL (ref 0–99)
NonHDL: 113.69
Total CHOL/HDL Ratio: 3
Triglycerides: 130 mg/dL (ref 0.0–149.0)
VLDL: 26 mg/dL (ref 0.0–40.0)

## 2019-04-01 LAB — BASIC METABOLIC PANEL
BUN: 37 mg/dL — ABNORMAL HIGH (ref 6–23)
CO2: 27 mEq/L (ref 19–32)
Calcium: 9.9 mg/dL (ref 8.4–10.5)
Chloride: 102 mEq/L (ref 96–112)
Creatinine, Ser: 1.34 mg/dL — ABNORMAL HIGH (ref 0.40–1.20)
GFR: 46.54 mL/min — ABNORMAL LOW (ref >60.00)
Glucose, Bld: 152 mg/dL — ABNORMAL HIGH (ref 70–99)
Potassium: 3.8 mEq/L (ref 3.5–5.1)
Sodium: 139 mEq/L (ref 135–145)

## 2019-04-01 LAB — HEMOGLOBIN A1C: Hgb A1c MFr Bld: 7.3 % — ABNORMAL HIGH (ref 4.6–6.5)

## 2019-04-01 LAB — MICROALBUMIN / CREATININE URINE RATIO
Creatinine,U: 178.6 mg/dL
Microalb Creat Ratio: 0.5 mg/g (ref 0.0–30.0)
Microalb, Ur: 1 mg/dL (ref 0.0–1.9)

## 2019-04-01 LAB — HEPATIC FUNCTION PANEL
ALT: 13 U/L (ref 0–35)
AST: 14 U/L (ref 0–37)
Albumin: 4 g/dL (ref 3.5–5.2)
Alkaline Phosphatase: 71 U/L (ref 39–117)
Bilirubin, Direct: 0.1 mg/dL (ref 0.0–0.3)
Total Bilirubin: 0.6 mg/dL (ref 0.2–1.2)
Total Protein: 7.3 g/dL (ref 6.0–8.3)

## 2019-04-01 LAB — TSH: TSH: 3.58 u[IU]/mL (ref 0.35–4.50)

## 2019-04-01 NOTE — Progress Notes (Signed)
So surprisingly a1c is up some to 7.3   I suppose you dont want medication but want to do intensifying  diet changes .  Also  creatinine( kidney lfunction is off   and not sure why  make sure not taking  Advil aleve type products  No protein in urine t..hat is good signs Please check bmp in 3-4 weeks well hydrated and non fasting ok dx elevaetd creatinine and keep a follow up for 4 mos from now or depending .   Cholesterol liver blood count are all good

## 2019-04-01 NOTE — Patient Instructions (Addendum)
Glad you are doing well Continue attention lifestyle intervention healthy eating and exercise . Weight loss   Check feet daily reconsider covid vaccine as discussed . Will notify you  of labs when available.   Plan cpx or visit in 4- 6 months depending on lab results .

## 2019-04-01 NOTE — Progress Notes (Signed)
Chief Complaint  Patient presents with  . Diabetes    Pt is here to follow up on diabetes pt states she has no other concerns and that she has been good     HPI: Rebecca Rich 74 y.o. come in for Chronic disease management   DM thinks doing well at home  No new sx  Eye check  Sees retinal spec  To see pod soon  noulcers numbness  Bp  Has been stable and no change in meds   Had spider bite thigh in winter  Better after draining and antibiotic   To see cards in a y ear last seen 12 20  ROS: See pertinent positives and negatives per HPI. No cp sob new swelling  Skin changes   Past Medical History:  Diagnosis Date  . Acute bronchospasm 08/24/2009  . Acute lower GI bleeding 02/16/2017  . Acute perforated appendicitis w abscess s/p lap appendectomy 12/30/2017 12/30/2017  . Acute sphenoidal sinusitis 02/26/2010   Qualifier: Diagnosis of  By: Regis Bill MD, Standley Brooking   . CARDIAC MURMUR 12/08/2006  . COLONIC POLYPS, HX OF 12/08/2006  . DIABETES MELLITUS, TYPE II 12/08/2006  . Gallbladder/common duct stone, without infection, with obstruction 03/01/2010   removed ercp  . HYPERLIPIDEMIA 12/08/2006  . HYPERTENSION 12/08/2006  . 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 INFECTION, SKIN AND SOFT TISSUE 07/28/2008  . 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 LIVER FUNCTION TESTS, ABNORMAL 07/28/2008  . Morbid obesity ([place]) 12/08/2006  . OBESITY 09/24/2009  . OBSTRUCTIVE SLEEP APNEA 12/08/2006  . OSTEOARTHRITIS 12/08/2006  . RUQ PAIN 06/16/2008  . SHOULDER PAIN, RIGHT 02/07/2008  . Sleep apnea   . Swelling of limb 07/28/2008  . THYROID FUNCTION TEST, ABNORMAL 12/08/2006    Family History  Problem Relation Age of Onset  . Other Mother        blood clots  . Cervical cancer Mother   . Cancer Mother   . Heart disease Brother   . Diabetes Brother   . Heart disease Sister   . Diabetes Sister   . Heart disease Brother   . Diabetes Brother   . Heart disease Brother   . Diabetes Brother   . Stroke Sister   . Diabetes  Sister   . Throat cancer Father   . Cancer Father   . Diabetes Other        all siblings, 4 brothers, 5 sisters    Social History   Socioeconomic History  . Marital status: Divorced    Spouse name: Not on file  . Number of children: Not on file  . Years of education: Not on file  . Highest education level: Not on file  Occupational History  . Occupation: retired    Fish farm manager: RETIRED  Tobacco Use  . Smoking status: Never Smoker  . Smokeless tobacco: Never Used  Substance and Sexual Activity  . Alcohol use: No    Alcohol/week: 0.0 standard drinks  . Drug use: No  . Sexual activity: Not on file  Other Topics Concern  . Not on file  Social History Narrative   Master level education in math   Pt is currently retired   Pt is divorced with children   Recently had to move had a break in and thus away from her pool exercise   Social Determinants of Health   Financial Resource Strain:   . Difficulty of Paying Living Expenses: Not on file  Food Insecurity:   . Worried About Charity fundraiser  in the Last Year: Not on file  . Ran Out of Food in the Last Year: Not on file  Transportation Needs:   . Lack of Transportation (Medical): Not on file  . Lack of Transportation (Non-Medical): Not on file  Physical Activity:   . Days of Exercise per Week: Not on file  . Minutes of Exercise per Session: Not on file  Stress:   . Feeling of Stress : Not on file  Social Connections:   . Frequency of Communication with Friends and Family: Not on file  . Frequency of Social Gatherings with Friends and Family: Not on file  . Attends Religious Services: Not on file  . Active Member of Clubs or Organizations: Not on file  . Attends Archivist Meetings: Not on file  . Marital Status: Not on file    Outpatient Medications Prior to Visit  Medication Sig Dispense Refill  . amLODipine (NORVASC) 5 MG tablet TAKE 1 TABLET BY MOUTH EVERY DAY 90 tablet 0  . chlorthalidone (HYGROTON) 25  MG tablet TAKE 1 TABLET BY MOUTH EVERY DAY 90 tablet 1  . cholecalciferol (VITAMIN D) 1000 UNITS tablet Take 1,000 Units by mouth daily.    Marland Kitchen glucose blood (ACCU-CHEK AVIVA PLUS) test strip Use to check blood sugar daily E11.9 100 each 12  . Lancets (ACCU-CHEK SOFT TOUCH) lancets Use to test blood glucose twice daily 50 each 12  . levothyroxine (SYNTHROID) 75 MCG tablet TAKE 1 TABLET (75 MCG TOTAL) BY MOUTH DAILY BEFORE BREAKFAST. 90 tablet 0  . losartan (COZAAR) 100 MG tablet TAKE 1 TABLET (100 MG TOTAL) BY MOUTH DAILY. MAY BEGIN AT 1/2 TABLET DAILY AND INCREASE TO 1 TABLET 90 tablet 1  . metoprolol succinate (TOPROL-XL) 25 MG 24 hr tablet TAKE 1 TABLET BY MOUTH EVERY DAY 90 tablet 2  . Misc Natural Products (OSTEO BI-FLEX ADV JOINT SHIELD PO) Take 1 tablet by mouth daily.    . Multiple Vitamins-Minerals (CENTRUM SILVER PO) Take 1 tablet by mouth daily.      . potassium chloride (KLOR-CON) 10 MEQ tablet TAKE 1 TABLET (10 MEQ TOTAL) BY MOUTH 3 (THREE) TIMES DAILY. 90 tablet 5   No facility-administered medications prior to visit.     EXAM: Wt Readings from Last 3 Encounters:  04/01/19 267 lb 6.4 oz (121.3 kg)  12/31/18 276 lb (125.2 kg)  12/06/18 267 lb (121.1 kg)    BP 136/70 (BP Location: Right Arm, Patient Position: Sitting, Cuff Size: Normal)   Pulse 76   Temp 97.6 F (36.4 C) (Temporal)   Wt 267 lb 6.4 oz (121.3 kg)   SpO2 96%   BMI 50.52 kg/m   Body mass index is 50.52 kg/m.  GENERAL: vitals reviewed and listed above, alert, oriented, appears well hydrated and in no acute distress HEENT: atraumatic, conjunctiva  clear, no obvious abnormalities on inspection of external nose and ears OP : masked  NECK: no obvious masses on inspection palpation  LUNGS: clear to auscultation bilaterally, no wheezes, rales or rhonchi, good air movement CV: HRRR, no clubbing cyanosis  nl cap refill  chornic leg changes no ulcers  Faint sem lusb  MS: moves all extremities without noticeable  focal  abnormality PSYCH: pleasant and cooperative, no obvious depression or anxiety Diabetic Foot Exam - Simple   Simple Foot Form Diabetic Foot exam was performed with the following findings: Yes 04/01/2019 11:00 AM  Visual Inspection No deformities, no ulcerations, no other skin breakdown bilaterally: Yes See comments: Yes  Sensation Testing Intact to touch and monofilament testing bilaterally: Yes Pulse Check Posterior Tibialis and Dorsalis pulse intact bilaterally: Yes Comments thckened nails  And scaling  At heel area  No ulcer  Pulses are present      Lab Results  Component Value Date   WBC 5.7 04/01/2019   HGB 13.1 04/01/2019   HCT 38.7 04/01/2019   PLT 190.0 04/01/2019   GLUCOSE 152 (H) 04/01/2019   CHOL 166 04/01/2019   TRIG 130.0 04/01/2019   HDL 51.90 04/01/2019   LDLCALC 88 04/01/2019   ALT 13 04/01/2019   AST 14 04/01/2019   NA 139 04/01/2019   K 3.8 04/01/2019   CL 102 04/01/2019   CREATININE 1.34 (H) 04/01/2019   BUN 37 (H) 04/01/2019   CO2 27 04/01/2019   TSH 3.58 04/01/2019   HGBA1C 7.3 (H) 04/01/2019   MICROALBUR 1.0 04/01/2019   BP Readings from Last 3 Encounters:  04/01/19 136/70  12/31/18 132/68  12/06/18 120/68    ASSESSMENT AND PLAN:  Discussed the following assessment and plan:  Diabetes mellitus type 2 in obese ([place]) - Plan: Basic metabolic panel, CBC with Differential/Platelet, Hemoglobin A1c, Hepatic function panel, Lipid panel, TSH, Microalbumin / creatinine urine ratio  Hypothyroidism, unspecified type - Plan: Basic metabolic panel, CBC with Differential/Platelet, Hemoglobin A1c, Hepatic function panel, Lipid panel, TSH, Microalbumin / creatinine urine ratio  Essential hypertension - Plan: Basic metabolic panel, CBC with Differential/Platelet, Hemoglobin A1c, Hepatic function panel, Lipid panel, TSH, Microalbumin / creatinine urine ratio  Medication management - Plan: Basic metabolic panel, CBC with Differential/Platelet, Hemoglobin  A1c, Hepatic function panel, Lipid panel, TSH, Microalbumin / creatinine urine ratio  HYPERLIPIDEMIA - Plan: Basic metabolic panel, CBC with Differential/Platelet, Hemoglobin A1c, Hepatic function panel, Lipid panel, TSH, Microalbumin / creatinine urine ratio Due for full set of labs  See below  She feels well current decline covid vaccine and "may get when every one else  has one and then I wont need it" cpx eventually  -Patient advised to return or notify health care team  if  new concerns arise. Outside external source  DATA REVIEWED:  Yes   Total time on date  of service including record review ordering and plan of care:  30 minutes    Patient Instructions  Glad you are doing well Continue attention lifestyle intervention healthy eating and exercise . Weight loss   Check feet daily reconsider covid vaccine as discussed . Will notify you  of labs when available.   Plan cpx or visit in 4- 6 months depending on lab results .     Standley Brooking. Johnell Bas M.D.

## 2019-04-02 ENCOUNTER — Other Ambulatory Visit: Payer: Self-pay

## 2019-04-02 DIAGNOSIS — R7989 Other specified abnormal findings of blood chemistry: Secondary | ICD-10-CM

## 2019-04-08 ENCOUNTER — Other Ambulatory Visit: Payer: Self-pay

## 2019-04-11 ENCOUNTER — Other Ambulatory Visit: Payer: Medicare HMO

## 2019-04-12 ENCOUNTER — Other Ambulatory Visit: Payer: Self-pay

## 2019-04-12 ENCOUNTER — Ambulatory Visit (INDEPENDENT_AMBULATORY_CARE_PROVIDER_SITE_OTHER): Payer: Medicare HMO | Admitting: Sports Medicine

## 2019-04-12 ENCOUNTER — Encounter: Payer: Self-pay | Admitting: Sports Medicine

## 2019-04-12 VITALS — Temp 97.5°F

## 2019-04-12 DIAGNOSIS — M79676 Pain in unspecified toe(s): Secondary | ICD-10-CM | POA: Diagnosis not present

## 2019-04-12 DIAGNOSIS — B351 Tinea unguium: Secondary | ICD-10-CM | POA: Diagnosis not present

## 2019-04-12 DIAGNOSIS — E119 Type 2 diabetes mellitus without complications: Secondary | ICD-10-CM | POA: Diagnosis not present

## 2019-04-12 NOTE — Progress Notes (Signed)
Subjective: Rebecca Rich is a 74 y.o. female patient with history of diabetes who returns to office today complaining of long,mildly painful nails  while ambulating in shoes; unable to trim. Patient denies any new cramping, numbness, burning or tingling in the legs but did have 1 episode of pain at right foot itching, no other pedal complaints noted.   Reports that A1c 6.6 and FBS last night was 219  Patient Active Problem List   Diagnosis Date Noted  . Numbness and tingling of left leg 04/05/2018  . Influenza vaccination declined by patient 04/05/2018  . SVT (supraventricular tachycardia) ([place]) 12/31/2017  . Diverticulosis of intestine with bleeding   . Rectal bleeding   . Hx of gout 08/21/2015  . Mild aortic stenosis 08/21/2015  . Muscle spasm 03/22/2013  . Skin lesion of back 09/13/2012  . Left shoulder pain 01/20/2012  . Hypothyroid 05/31/2011  . Elevated uric acid in blood 05/31/2011  . Palmar nodule 03/25/2011  . Abnormal urine 02/20/2011  . 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 SHOULDER PAIN, RIGHT 02/07/2008  . Diabetes mellitus type 2 in obese ([place]) 12/08/2006  . HYPERLIPIDEMIA 12/08/2006  . Morbid obesity with BMI of 50.0-59.9, adult ([place]) 12/08/2006  . OSA on CPAP 12/08/2006  . Essential hypertension 12/08/2006  . OSTEOARTHRITIS 12/08/2006  . THYROID FUNCTION TEST, ABNORMAL 12/08/2006  . COLONIC POLYPS, HX OF 12/08/2006   Current Outpatient Medications on File Prior to Visit  Medication Sig Dispense Refill  . amLODipine (NORVASC) 5 MG tablet TAKE 1 TABLET BY MOUTH EVERY DAY 90 tablet 0  . chlorthalidone (HYGROTON) 25 MG tablet TAKE 1 TABLET BY MOUTH EVERY DAY 90 tablet 1  . cholecalciferol (VITAMIN D) 1000 UNITS tablet Take 1,000 Units by mouth daily.    Marland Kitchen glucose blood (ACCU-CHEK AVIVA PLUS) test strip Use to check blood sugar daily E11.9 100 each 12  . Lancets (ACCU-CHEK SOFT TOUCH) lancets Use to test blood glucose twice daily 50 each 12   . levothyroxine (SYNTHROID) 75 MCG tablet TAKE 1 TABLET (75 MCG TOTAL) BY MOUTH DAILY BEFORE BREAKFAST. 90 tablet 0  . losartan (COZAAR) 100 MG tablet TAKE 1 TABLET (100 MG TOTAL) BY MOUTH DAILY. MAY BEGIN AT 1/2 TABLET DAILY AND INCREASE TO 1 TABLET 90 tablet 1  . metoprolol succinate (TOPROL-XL) 25 MG 24 hr tablet TAKE 1 TABLET BY MOUTH EVERY DAY 90 tablet 2  . Misc Natural Products (OSTEO BI-FLEX ADV JOINT SHIELD PO) Take 1 tablet by mouth daily.    . Multiple Vitamins-Minerals (CENTRUM SILVER PO) Take 1 tablet by mouth daily.      . potassium chloride (KLOR-CON) 10 MEQ tablet TAKE 1 TABLET (10 MEQ TOTAL) BY MOUTH 3 (THREE) TIMES DAILY. 90 tablet 5   No current facility-administered medications on file prior to visit.   No Known Allergies  Recent Results (from the past 2160 hour(s))  Basic metabolic panel     Status: Abnormal   Collection Time: 04/01/19 10:08 AM  Result Value Ref Range   Sodium 139 135 - 145 mEq/L   Potassium 3.8 3.5 - 5.1 mEq/L   Chloride 102 96 - 112 mEq/L   CO2 27 19 - 32 mEq/L   Glucose, Bld 152 (H) 70 - 99 mg/dL   BUN 37 (H) 6 - 23 mg/dL   Creatinine, Ser 1.34 (H) 0.40 - 1.20 mg/dL   GFR 46.54 (L) >60.00 mL/min   Calcium 9.9 8.4 - 10.5 mg/dL  CBC with Differential/Platelet  Status: None   Collection Time: 04/01/19 10:08 AM  Result Value Ref Range   WBC 5.7 4.0 - 10.5 K/uL   RBC 4.38 3.87 - 5.11 Mil/uL   Hemoglobin 13.1 12.0 - 15.0 g/dL   HCT 38.7 36.0 - 46.0 %   MCV 88.6 78.0 - 100.0 fl   MCHC 33.8 30.0 - 36.0 g/dL   RDW 14.0 11.5 - 15.5 %   Platelets 190.0 150.0 - 400.0 K/uL   Neutrophils Relative % 59.0 43.0 - 77.0 %   Lymphocytes Relative 30.3 12.0 - 46.0 %   Monocytes Relative 7.7 3.0 - 12.0 %   Eosinophils Relative 2.6 0.0 - 5.0 %   Basophils Relative 0.4 0.0 - 3.0 %   Neutro Abs 3.4 1.4 - 7.7 K/uL   Lymphs Abs 1.7 0.7 - 4.0 K/uL   Monocytes Absolute 0.4 0.1 - 1.0 K/uL   Eosinophils Absolute 0.1 0.0 - 0.7 K/uL   Basophils Absolute 0.0 0.0  - 0.1 K/uL  Hemoglobin A1c     Status: Abnormal   Collection Time: 04/01/19 10:08 AM  Result Value Ref Range   Hgb A1c MFr Bld 7.3 (H) 4.6 - 6.5 %    Comment: Glycemic Control Guidelines for People with Diabetes:Non Diabetic:  <6%Goal of Therapy: <7%Additional Action Suggested:  >8%   Hepatic function panel     Status: None   Collection Time: 04/01/19 10:08 AM  Result Value Ref Range   Total Bilirubin 0.6 0.2 - 1.2 mg/dL   Bilirubin, Direct 0.1 0.0 - 0.3 mg/dL   Alkaline Phosphatase 71 39 - 117 U/L   AST 14 0 - 37 U/L   ALT 13 0 - 35 U/L   Total Protein 7.3 6.0 - 8.3 g/dL   Albumin 4.0 3.5 - 5.2 g/dL  Lipid panel     Status: None   Collection Time: 04/01/19 10:08 AM  Result Value Ref Range   Cholesterol 166 0 - 200 mg/dL    Comment: ATP III Classification       Desirable:  < 200 mg/dL               Borderline High:  200 - 239 mg/dL          High:  > = 240 mg/dL   Triglycerides 130.0 0.0 - 149.0 mg/dL    Comment: Normal:  <150 mg/dLBorderline High:  150 - 199 mg/dL   HDL 51.90 >39.00 mg/dL   VLDL 26.0 0.0 - 40.0 mg/dL   LDL Cholesterol 88 0 - 99 mg/dL   Total CHOL/HDL Ratio 3     Comment:                Men          Women1/2 Average Risk     3.4          3.3Average Risk          5.0          4.42X Average Risk          9.6          7.13X Average Risk          15.0          11.0                       NonHDL 113.69     Comment: NOTE:  Non-HDL goal should be 30 mg/dL higher than patient's LDL goal (i.e. LDL goal of <  70 mg/dL, would have non-HDL goal of < 100 mg/dL)  TSH     Status: None   Collection Time: 04/01/19 10:08 AM  Result Value Ref Range   TSH 3.58 0.35 - 4.50 uIU/mL  Microalbumin / creatinine urine ratio     Status: None   Collection Time: 04/01/19 10:08 AM  Result Value Ref Range   Microalb, Ur 1.0 0.0 - 1.9 mg/dL   Creatinine,U 178.6 mg/dL   Microalb Creat Ratio 0.5 0.0 - 30.0 mg/g    Objective: General: Patient is awake, alert, and oriented x 3 and in no acute  distress.  Integument: Skin is warm, dry and supple bilateral. Nails are tender, long, thickened and  dystrophic with subungual debris, consistent with onychomycosis, 1-5 bilateral. No acute ingrowing on left great toe. No signs of infection. No open lesions or preulcerative lesions present bilateral. Remaining integument unremarkable.  Vasculature:  Dorsalis Pedis pulse 2/4 bilateral. Posterior Tibial pulse  1/4 bilateral.  Capillary fill time <3 sec 1-5 bilateral. Positive hair growth to the level of the digits. Temperature gradient within normal limits. No varicosities present bilateral. No edema present bilateral.   Neurology: The patient has intact sensation measured with a 5.07/10g Semmes Weinstein Monofilament at all pedal sites bilateral . Vibratory sensation diminished bilateral with tuning fork. No Babinski sign present bilateral.   Musculoskeletal: No reproducible pain on right arch, subjective itiching. Asymptomatic pes pedal deformities noted bilateral. Muscular strength 5/5 in all lower extremity muscular groups bilateral without pain on range of motion . No tenderness with calf compression bilateral.  Assessment and Plan: Problem List Items Addressed This Visit    None    Visit Diagnoses    Pain due to onychomycosis of nail    -  Primary   Diabetes mellitus without complication ([place])          -Examined patient. -Re-Discussed and educated patient on diabetic foot care, especially with  regards to the vascular, neurological and musculoskeletal systems.  -Mechanically debrided all nails 1-5 bilateral using sterile nail nipper and filed with dremel without incident  -Recommend Lamisil spray for itching  -Answered all patient questions -Patient to return  in 3 months for at risk foot care -Patient advised to call the office if any problems or questions arise in the meantime.  Landis Martins, DPM

## 2019-04-12 NOTE — Patient Instructions (Signed)
Get Tinactin or Lamisil spray OTC to use for itchiness on both feet

## 2019-04-16 ENCOUNTER — Other Ambulatory Visit: Payer: Self-pay | Admitting: Internal Medicine

## 2019-04-28 ENCOUNTER — Other Ambulatory Visit: Payer: Self-pay

## 2019-05-02 ENCOUNTER — Other Ambulatory Visit: Payer: Self-pay

## 2019-05-02 ENCOUNTER — Other Ambulatory Visit (INDEPENDENT_AMBULATORY_CARE_PROVIDER_SITE_OTHER): Payer: Medicare HMO

## 2019-05-02 DIAGNOSIS — R7989 Other specified abnormal findings of blood chemistry: Secondary | ICD-10-CM

## 2019-05-02 LAB — BASIC METABOLIC PANEL
BUN: 24 mg/dL — ABNORMAL HIGH (ref 6–23)
CO2: 28 mEq/L (ref 19–32)
Calcium: 9.3 mg/dL (ref 8.4–10.5)
Chloride: 102 mEq/L (ref 96–112)
Creatinine, Ser: 1.18 mg/dL (ref 0.40–1.20)
GFR: 53.88 mL/min — ABNORMAL LOW (ref >60.00)
Glucose, Bld: 125 mg/dL — ABNORMAL HIGH (ref 70–99)
Potassium: 3.6 mEq/L (ref 3.5–5.1)
Sodium: 141 mEq/L (ref 135–145)

## 2019-05-06 NOTE — Progress Notes (Signed)
Kidney function creatinine  better but still not to baseline  will follow  up at your 4 mos follow up

## 2019-05-10 IMAGING — DX DG STERNUM 2+V
2 series · 2 of 2 positions shown · non-contrast
Comparison: None.

CLINICAL DATA: Bowel overlying the left proximal clavicle.

EXAM:
STERNUM - 2+ VIEW

[sternum oblique pa]
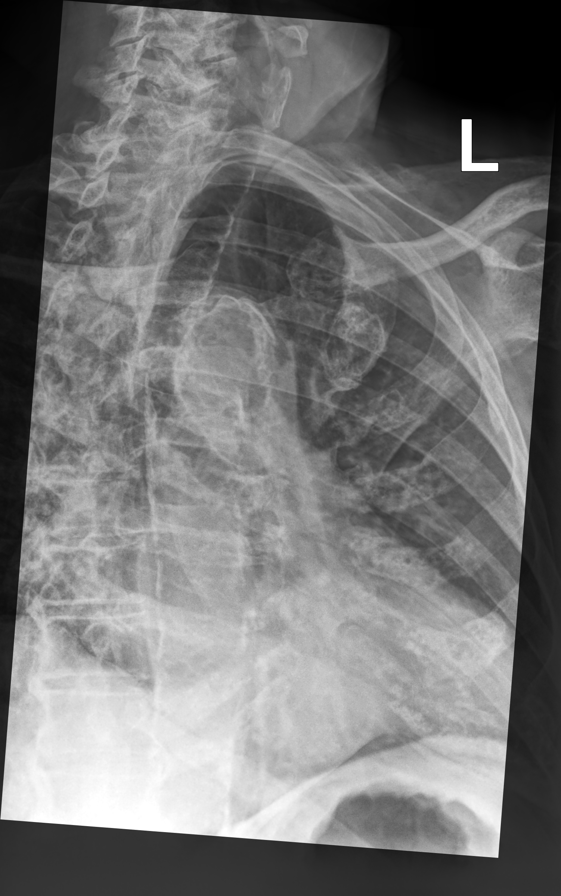

[sternum lat]
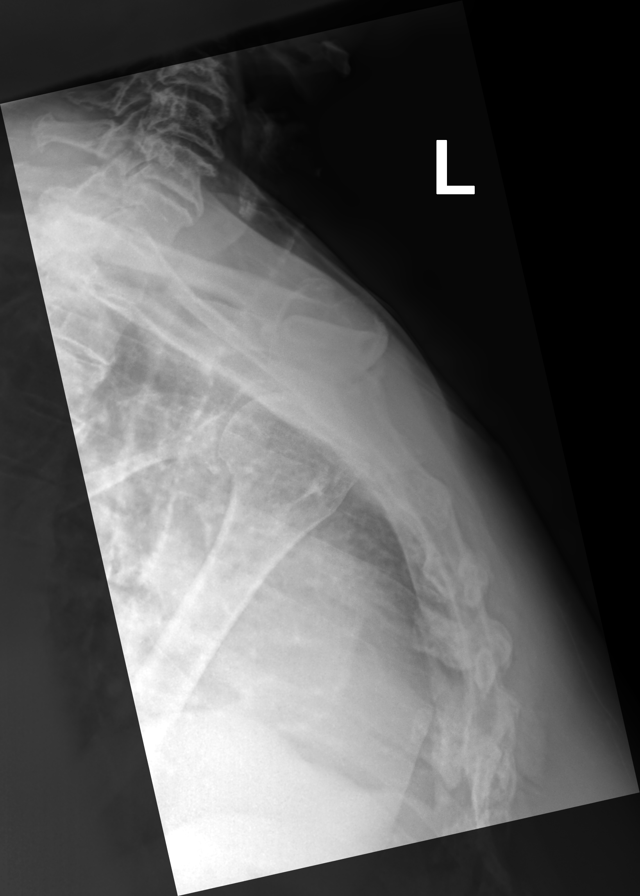

[2 of 2 positions shown; findings below may reference images not displayed]

FINDINGS: Prominence of the left clavicular head with subcortical degenerative
cystic change is identified. No acute fracture or bone destruction.
No acute osseous abnormality of the included manubrium and sternum.
No retrosternal mass. The adjacent ribs and lung are unremarkable.
Extensive cartilaginous calcifications are noted off the anterior
aspect of the included ribs. This limits further assessment.
IMPRESSION: Mild prominence of the left clavicular head likely developmental or
related to osteoarthritis with subcortical degenerative cystic
change. No acute osseous abnormality.

## 2019-05-10 IMAGING — DX DG CLAVICLE*L*
2 series · 2 of 2 positions shown · non-contrast
Comparison: None.

CLINICAL DATA: Bump over the left proximal clavicle.

EXAM:
LEFT CLAVICLE - 2+ VIEWS

[clavicle ap]
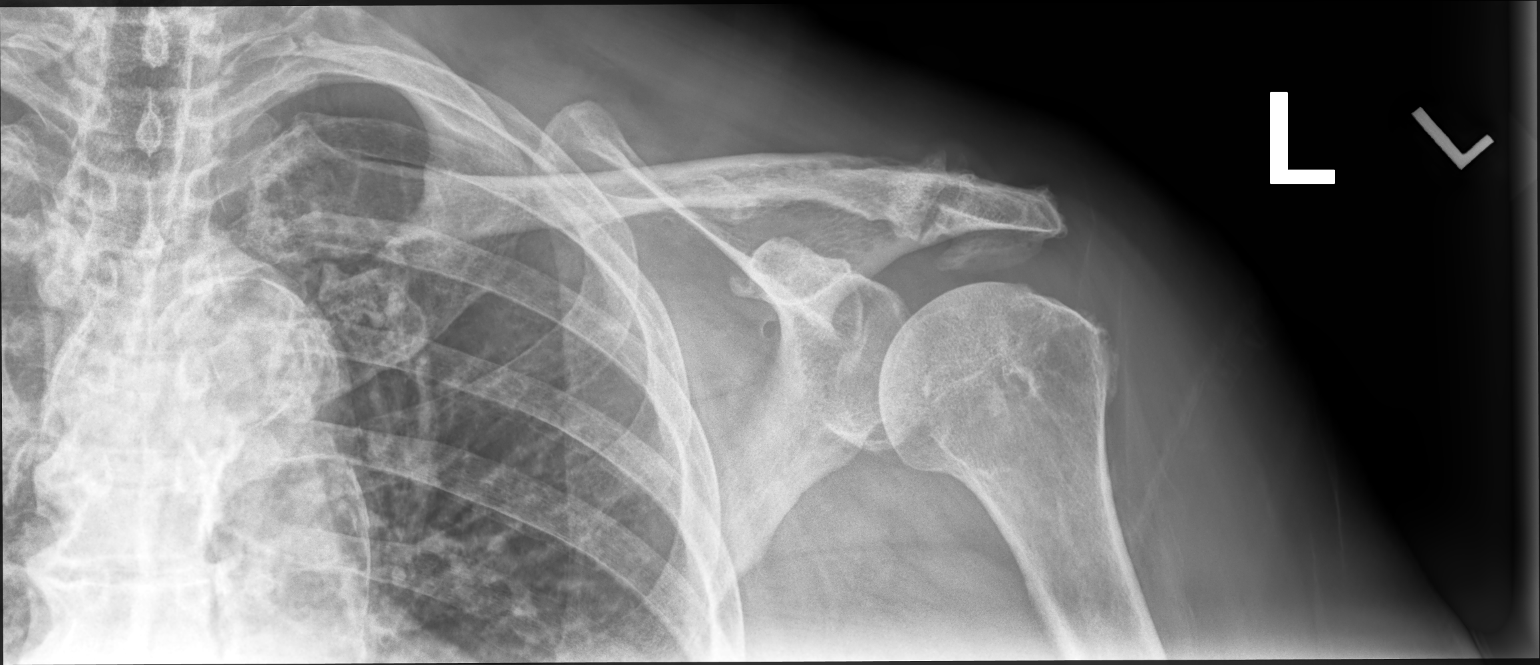

[clavicle tangential]
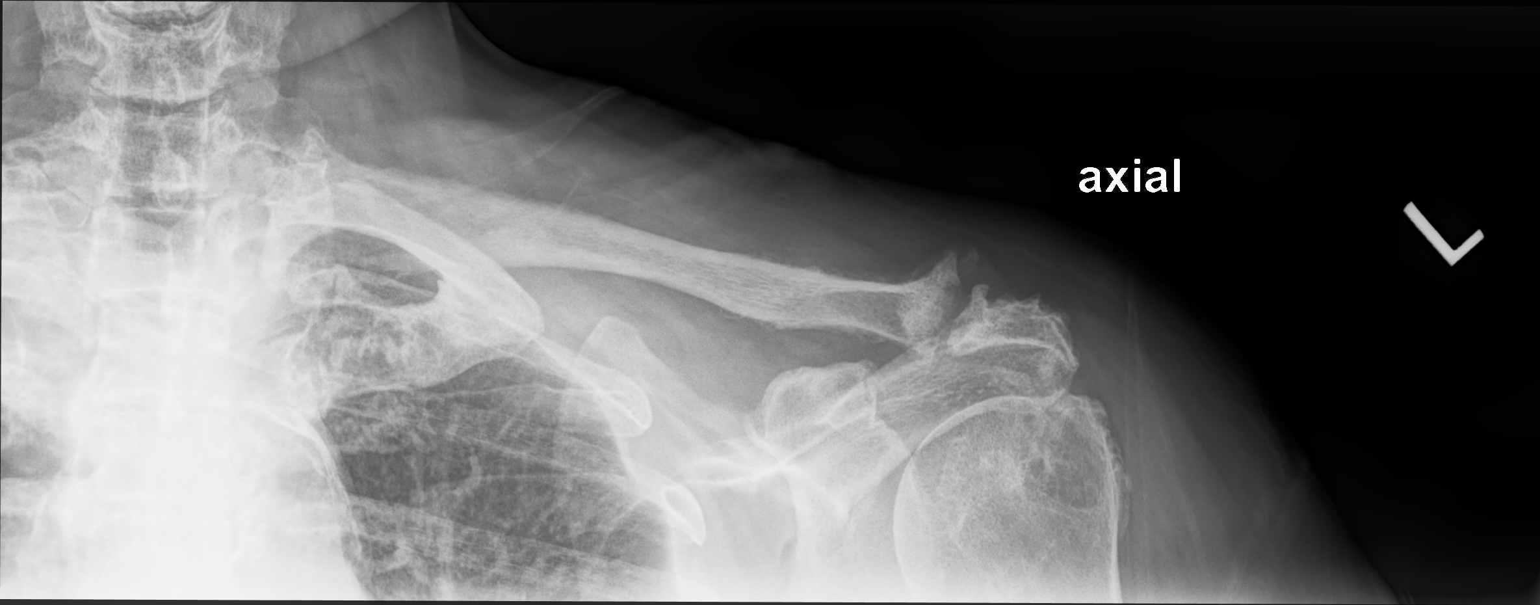

[2 of 2 positions shown; findings below may reference images not displayed]

FINDINGS: Osteoarthritis of the sternoclavicular and acromioclavicular joints
in addition to the glenohumeral joint. Subcortical cystic change and
bony hypertrophy is noted about the sternoclavicular and AC joints.
Slight soft tissue prominence is noted overlying the supraclavicular
soft tissues which may be due to projection. Other etiologies such
as supraclavicular adenopathy are not entirely excluded but believed
less likely. Aortic atherosclerosis at the arch and descending
portions. The included left upper ribs and lung are nonacute.
IMPRESSION: 1. Osteoarthritis of the sternoclavicular and AC joints.
2. Soft tissue prominence overlying the supraclavicular soft tissues
may be due to projection. Other etiologies such as supraclavicular
adenopathy are not entirely excluded but believed less likely. If
clinically necessary, additional imaging with CT may help for
further correlation.

## 2019-05-26 ENCOUNTER — Other Ambulatory Visit: Payer: Self-pay | Admitting: Internal Medicine

## 2019-06-24 ENCOUNTER — Other Ambulatory Visit: Payer: Self-pay | Admitting: Internal Medicine

## 2019-07-08 ENCOUNTER — Other Ambulatory Visit: Payer: Self-pay | Admitting: Internal Medicine

## 2019-07-16 ENCOUNTER — Other Ambulatory Visit: Payer: Self-pay | Admitting: Internal Medicine

## 2019-07-19 ENCOUNTER — Ambulatory Visit: Payer: Medicare HMO | Admitting: Sports Medicine

## 2019-07-19 ENCOUNTER — Other Ambulatory Visit: Payer: Self-pay

## 2019-07-19 ENCOUNTER — Encounter: Payer: Self-pay | Admitting: Sports Medicine

## 2019-07-19 DIAGNOSIS — E119 Type 2 diabetes mellitus without complications: Secondary | ICD-10-CM

## 2019-07-19 DIAGNOSIS — B351 Tinea unguium: Secondary | ICD-10-CM

## 2019-07-19 DIAGNOSIS — M79609 Pain in unspecified limb: Secondary | ICD-10-CM | POA: Diagnosis not present

## 2019-07-19 DIAGNOSIS — I890 Lymphedema, not elsewhere classified: Secondary | ICD-10-CM | POA: Diagnosis not present

## 2019-07-19 NOTE — Progress Notes (Signed)
Subjective: Rebecca Rich is a 74 y.o. female patient with history of diabetes who returns to office today complaining of long,mildly painful nails  while ambulating in shoes; unable to trim. Patient reports that spray has helped the itchiness, feels much better. No other pedal complaints noted.   FBS not checked   Patient Active Problem List   Diagnosis Date Noted  . Numbness and tingling of left leg 04/05/2018  . Influenza vaccination declined by patient 04/05/2018  . SVT (supraventricular tachycardia) ([place]) 12/31/2017  . Diverticulosis of intestine with bleeding   . Rectal bleeding   . Hx of gout 08/21/2015  . Mild aortic stenosis 08/21/2015  . Muscle spasm 03/22/2013  . Skin lesion of back 09/13/2012  . Left shoulder pain 01/20/2012  . Hypothyroid 05/31/2011  . Elevated uric acid in blood 05/31/2011  . Palmar nodule 03/25/2011  . Abnormal urine 02/20/2011  . 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 SHOULDER PAIN, RIGHT 02/07/2008  . Diabetes mellitus type 2 in obese ([place]) 12/08/2006  . HYPERLIPIDEMIA 12/08/2006  . Morbid obesity with BMI of 50.0-59.9, adult ([place]) 12/08/2006  . OSA on CPAP 12/08/2006  . Essential hypertension 12/08/2006  . OSTEOARTHRITIS 12/08/2006  . THYROID FUNCTION TEST, ABNORMAL 12/08/2006  . COLONIC POLYPS, HX OF 12/08/2006   Current Outpatient Medications on File Prior to Visit  Medication Sig Dispense Refill  . amLODipine (NORVASC) 5 MG tablet TAKE 1 TABLET BY MOUTH EVERY DAY 90 tablet 0  . chlorthalidone (HYGROTON) 25 MG tablet TAKE 1 TABLET BY MOUTH EVERY DAY 90 tablet 1  . cholecalciferol (VITAMIN D) 1000 UNITS tablet Take 1,000 Units by mouth daily.    Marland Kitchen glucose blood (ACCU-CHEK AVIVA PLUS) test strip Use to check blood sugar daily E11.9 100 each 12  . Lancets (ACCU-CHEK SOFT TOUCH) lancets Use to test blood glucose twice daily 50 each 12  . levothyroxine (SYNTHROID) 75 MCG tablet TAKE 1 TABLET (75 MCG TOTAL) BY MOUTH DAILY  BEFORE BREAKFAST. 90 tablet 0  . losartan (COZAAR) 100 MG tablet TAKE 1 TABLET (100 MG TOTAL) BY MOUTH DAILY. MAY BEGIN AT 1/2 TABLET DAILY AND INCREASE TO 1 TABLET 90 tablet 1  . metoprolol succinate (TOPROL-XL) 25 MG 24 hr tablet TAKE 1 TABLET BY MOUTH EVERY DAY 90 tablet 2  . Misc Natural Products (OSTEO BI-FLEX ADV JOINT SHIELD PO) Take 1 tablet by mouth daily.    . Multiple Vitamins-Minerals (CENTRUM SILVER PO) Take 1 tablet by mouth daily.      . potassium chloride (KLOR-CON) 10 MEQ tablet TAKE 1 TABLET (10 MEQ TOTAL) BY MOUTH 3 (THREE) TIMES DAILY. 90 tablet 5   No current facility-administered medications on file prior to visit.   No Known Allergies  Recent Results (from the past 2160 hour(s))  Basic Metabolic Panel     Status: Abnormal   Collection Time: 05/02/19  8:30 AM  Result Value Ref Range   Sodium 141 135 - 145 mEq/L   Potassium 3.6 3.5 - 5.1 mEq/L   Chloride 102 96 - 112 mEq/L   CO2 28 19 - 32 mEq/L   Glucose, Bld 125 (H) 70 - 99 mg/dL   BUN 24 (H) 6 - 23 mg/dL   Creatinine, Ser 1.18 0.40 - 1.20 mg/dL   GFR 53.88 (L) >60.00 mL/min   Calcium 9.3 8.4 - 10.5 mg/dL    Objective: General: Patient is awake, alert, and oriented x 3 and in no acute distress.  Integument: Skin is warm, dry  and supple bilateral. Nails are tender, long, thickened and dystrophic with subungual debris, consistent with onychomycosis, 1-5 bilateral.  No signs of infection. No open lesions or preulcerative lesions present bilateral. Remaining integument unremarkable. No signs of tinea. Previous itching now resolved.  Vasculature:  Dorsalis Pedis pulse 2/4 bilateral. Posterior Tibial pulse 0/4 bilateral, chronic edema today to ankles. Capillary fill time <3 sec 1-5 bilateral. +hyperpigmentation, Positive hair growth to the level of the digits.Temperature gradient within normal limits.   Neurology: The patient has intact sensation measured with a 5.07/10g Semmes Weinstein Monofilament at all pedal  sites bilateral as previously noted.  Musculoskeletal:Asymptomatic pes pedal deformities noted bilateral. Muscular strength 5/5 in all lower extremity muscular groups bilateral without pain on range of motion . No tenderness with calf compression bilateral.  Assessment and Plan: Problem List Items Addressed This Visit    None    Visit Diagnoses    Pain due to onychomycosis of nail    -  Primary   Diabetes mellitus without complication ([facility])       Lymphedema         -Examined patient. -Re-Discussed and educated patient on diabetic foot care, especially with regards to the vascular, neurological and musculoskeletal systems.  -Mechanically debrided all nails 1-5 bilateral using sterile nail nipper and filed with dremel without incident  -Advised patient to use Lamisil spray if itchiness returns  -Answered all patient questions -Patient to return  in 3 months for at risk foot care -Patient advised to call the office if any problems or questions arise in the meantime.  Landis Martins, DPM

## 2019-07-28 ENCOUNTER — Other Ambulatory Visit: Payer: Self-pay | Admitting: Physician Assistant

## 2019-07-29 MED ORDER — METOPROLOL SUCCINATE ER 25 MG PO TB24: 25.0000 mg | ORAL_TABLET | Freq: Every day | ORAL | 1 refills | Status: DC

## 2019-08-02 ENCOUNTER — Encounter: Payer: Self-pay | Admitting: Internal Medicine

## 2019-08-02 ENCOUNTER — Other Ambulatory Visit: Payer: Self-pay

## 2019-08-02 ENCOUNTER — Ambulatory Visit: Payer: Medicare HMO | Admitting: Internal Medicine

## 2019-08-02 VITALS — BP 128/76 | HR 75 | Temp 97.7°F | Ht 61.0 in | Wt 269.2 lb

## 2019-08-02 DIAGNOSIS — I10 Essential (primary) hypertension: Secondary | ICD-10-CM

## 2019-08-02 DIAGNOSIS — E1169 Type 2 diabetes mellitus with other specified complication: Secondary | ICD-10-CM | POA: Diagnosis not present

## 2019-08-02 DIAGNOSIS — R269 Unspecified abnormalities of gait and mobility: Secondary | ICD-10-CM

## 2019-08-02 DIAGNOSIS — E669 Obesity, unspecified: Secondary | ICD-10-CM | POA: Diagnosis not present

## 2019-08-02 DIAGNOSIS — E039 Hypothyroidism, unspecified: Secondary | ICD-10-CM

## 2019-08-02 DIAGNOSIS — Z6841 Body Mass Index (BMI) 40.0 and over, adult: Secondary | ICD-10-CM

## 2019-08-02 LAB — BASIC METABOLIC PANEL
BUN: 38 mg/dL — ABNORMAL HIGH (ref 6–23)
CO2: 25 mEq/L (ref 19–32)
Calcium: 9.5 mg/dL (ref 8.4–10.5)
Chloride: 106 mEq/L (ref 96–112)
Creatinine, Ser: 1.2 mg/dL (ref 0.40–1.20)
GFR: 52.81 mL/min — ABNORMAL LOW (ref >60.00)
Glucose, Bld: 134 mg/dL — ABNORMAL HIGH (ref 70–99)
Potassium: 3.5 mEq/L (ref 3.5–5.1)
Sodium: 141 mEq/L (ref 135–145)

## 2019-08-02 LAB — HEMOGLOBIN A1C: Hgb A1c MFr Bld: 7 % — ABNORMAL HIGH (ref 4.6–6.5)

## 2019-08-02 NOTE — Progress Notes (Signed)
Chief Complaint  Patient presents with   Diabetes    Doing okay    HPI: Rebecca Rich 74 y.o. come in for Chronic disease management  Obesity   Working on it  still So  20  min 2 x per week.  Trying to get more active . Gait leans forward  No falling  Still had episode of  muscle spasm left side and under  left arm without weakness or pain and  Uncertain why none for 2 weeks   Sees cards yearly  No cp sob new  No new dm  Foot care  Check podiatry  Mood good  Still staying isolated and waiting on the vaccine until fall and then could go back to water  exercise     ROS: See pertinent positives and negatives per HPI.  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   INFECTION, 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   LIVER FUNCTION TESTS, ABNORMAL 07/28/2008   Morbid obesity ([place]) 12/08/2006   OBESITY 09/24/2009   OBSTRUCTIVE SLEEP APNEA 12/08/2006   OSTEOARTHRITIS 12/08/2006   RUQ PAIN 06/16/2008   SHOULDER PAIN, RIGHT 02/07/2008   Sleep apnea    Swelling of limb 07/28/2008   THYROID FUNCTION TEST, ABNORMAL 12/08/2006    Family History  Problem Relation Age of Onset   Other Mother        blood clots   Cervical cancer Mother    Cancer Mother    Heart disease Brother    Diabetes Brother    Heart disease Sister    Diabetes Sister    Heart disease Brother    Diabetes Brother    Heart disease Brother    Diabetes  Brother    Stroke Sister    Diabetes Sister    Throat cancer Father    Cancer Father    Diabetes Other        all siblings, 4 brothers, 5 sisters    Social History   Socioeconomic History   Marital status: Divorced    Spouse name: Not on file   Number of children: Not on file   Years of education: Not on file   Highest education level: Not on file  Occupational History   Occupation: retired    Fish farm manager: RETIRED  Tobacco Use   Smoking status: Never Smoker   Smokeless tobacco: Never Used  Scientific laboratory technician Use: Never used  Substance and Sexual Activity   Alcohol use: No    Alcohol/week: 0.0 standard drinks   Drug use: No   Sexual activity: Not on file  Other Topics Concern   Not on file  Social History Narrative   Master level education in math   Pt is currently retired   Pt is divorced with children   Recently had to move had a break in and thus away from her pool exercise   Social Determinants of Health   Financial Resource Strain:    Difficulty  of Paying Living Expenses:   Food Insecurity:    Worried About Charity fundraiser in the Last Year:    Arboriculturist in the Last Year:   Transportation Needs:    Film/video editor (Medical):    Lack of Transportation (Non-Medical):   Physical Activity:    Days of Exercise per Week:    Minutes of Exercise per Session:   Stress:    Feeling of Stress :   Social Connections:    Frequency of Communication with Friends and Family:    Frequency of Social Gatherings with Friends and Family:    Attends Religious Services:    Active Member of Clubs or Organizations:    Attends Music therapist:    Marital Status:     Outpatient Medications Prior to Visit  Medication Sig Dispense Refill   amLODipine (NORVASC) 5 MG tablet TAKE 1 TABLET BY MOUTH EVERY DAY 90 tablet 0   chlorthalidone (HYGROTON) 25 MG tablet TAKE 1 TABLET BY MOUTH EVERY DAY 90 tablet 1    cholecalciferol (VITAMIN D) 1000 UNITS tablet Take 1,000 Units by mouth daily.     glucose blood (ACCU-CHEK AVIVA PLUS) test strip Use to check blood sugar daily E11.9 100 each 12   Lancets (ACCU-CHEK SOFT TOUCH) lancets Use to test blood glucose twice daily 50 each 12   levothyroxine (SYNTHROID) 75 MCG tablet TAKE 1 TABLET (75 MCG TOTAL) BY MOUTH DAILY BEFORE BREAKFAST. 90 tablet 0   losartan (COZAAR) 100 MG tablet TAKE 1 TABLET (100 MG TOTAL) BY MOUTH DAILY. MAY BEGIN AT 1/2 TABLET DAILY AND INCREASE TO 1 TABLET 90 tablet 1   metoprolol succinate (TOPROL-XL) 25 MG 24 hr tablet Take 1 tablet (25 mg total) by mouth daily. 90 tablet 1   Misc Natural Products (OSTEO BI-FLEX ADV JOINT SHIELD PO) Take 1 tablet by mouth daily.     Multiple Vitamins-Minerals (CENTRUM SILVER PO) Take 1 tablet by mouth daily.       potassium chloride (KLOR-CON) 10 MEQ tablet TAKE 1 TABLET (10 MEQ TOTAL) BY MOUTH 3 (THREE) TIMES DAILY. 90 tablet 5   No facility-administered medications prior to visit.     EXAM:  BP 128/76    Pulse 75    Temp 97.7 F (36.5 C) (Temporal)    Ht 5\' 1"  (1.549 m)    Wt 269 lb 3.2 oz (122.1 kg)    SpO2 98%    BMI 50.86 kg/m   Body mass index is 50.86 kg/m. Wt Readings from Last 3 Encounters:  08/02/19 269 lb 3.2 oz (122.1 kg)  04/01/19 267 lb 6.4 oz (121.3 kg)  12/31/18 276 lb (125.2 kg)    GENERAL: vitals reviewed and listed above, alert, oriented, appears well hydrated and in no acute distress HEENT: atraumatic, conjunctiva  clear, no obvious abnormalities on inspection of external nose and ears OP : masked  NECK: no obvious masses on inspection palpation  LUNGS: clear to auscultation bilaterally, no wheezes, rales or rhonchi, good air movement CV: HRRR,   systolic murmur  No radiation no clubbing cyanosis or  peripheral edema nl cap refill  MS: moves all extremities without noticeable focal  Abnormality  Dry skin  Feet no lesion  Gait  Steady  Leans forward  No stiffness  cogwheeling  PSYCH: pleasant and cooperative, no obvious depression or anxiety Lab Results  Component Value Date   WBC 5.7 04/01/2019   HGB 13.1 04/01/2019   HCT 38.7 04/01/2019  PLT 190.0 04/01/2019   GLUCOSE 134 (H) 08/02/2019   CHOL 166 04/01/2019   TRIG 130.0 04/01/2019   HDL 51.90 04/01/2019   LDLCALC 88 04/01/2019   ALT 13 04/01/2019   AST 14 04/01/2019   NA 141 08/02/2019   K 3.5 08/02/2019   CL 106 08/02/2019   CREATININE 1.20 08/02/2019   BUN 38 (H) 08/02/2019   CO2 25 08/02/2019   TSH 3.58 04/01/2019   HGBA1C 7.0 (H) 08/02/2019   MICROALBUR 1.0 04/01/2019   BP Readings from Last 3 Encounters:  08/02/19 128/76  04/01/19 136/70  12/31/18 132/68   Labs reviewed  ASSESSMENT AND PLAN:  Discussed the following assessment and plan:  Diabetes mellitus type 2 in obese ([place]) - Plan: Basic metabolic panel, Hemoglobin A1c  Essential hypertension - Plan: Basic metabolic panel, Hemoglobin A1c  Morbid obesity with BMI of 50.0-59.9, adult ([facility])  Hypothyroidism, unspecified type  Abnormality of gait Dm complicated  obesity ht and  Thyroid   -Patient advised to return or notify health care team  if  new concerns arise.  Patient Instructions  Consider physical therapy for gait  training  After get covid vaccine.   Will notify you  of labs when available.   Continue getting weight down .  Plan fu   cpx or other in 4 months  Or as needed     Mariann Laster K. Darran Gabay M.D.

## 2019-08-02 NOTE — Patient Instructions (Signed)
Consider physical therapy for gait  training  After get covid vaccine.   Will notify you  of labs when available.   Continue getting weight down .  Plan fu   cpx or other in 4 months  Or as needed

## 2019-08-03 NOTE — Progress Notes (Signed)
Kidney function stable  A1c is some better at 7.0  keep working on this  Will recheck oin fall as planned

## 2019-08-26 ENCOUNTER — Other Ambulatory Visit: Payer: Self-pay | Admitting: Internal Medicine

## 2019-09-05 ENCOUNTER — Ambulatory Visit (INDEPENDENT_AMBULATORY_CARE_PROVIDER_SITE_OTHER): Payer: Medicare HMO | Admitting: Adult Health

## 2019-09-05 ENCOUNTER — Other Ambulatory Visit: Payer: Self-pay

## 2019-09-05 ENCOUNTER — Encounter: Payer: Self-pay | Admitting: Adult Health

## 2019-09-05 DIAGNOSIS — Z6841 Body Mass Index (BMI) 40.0 and over, adult: Secondary | ICD-10-CM | POA: Diagnosis not present

## 2019-09-05 DIAGNOSIS — G4733 Obstructive sleep apnea (adult) (pediatric): Secondary | ICD-10-CM

## 2019-09-05 DIAGNOSIS — Z9989 Dependence on other enabling machines and devices: Secondary | ICD-10-CM

## 2019-09-05 NOTE — Progress Notes (Signed)
@Patient  ID: Rebecca Rich, female    DOB: Nov 26, 1945, 74 y.o.   MRN: [id number]  Chief Complaint  Patient presents with  . Follow-up    OSA     Referring provider: Burnis Medin, MD  HPI: 74 year old female followed for obstructive sleep apnea on nocturnal BiPAP Medical history significant for diastolic dysfunction and aortic valve stenosis   TEST/EVENTS :  06/2018 PAP titration >> was severe and CPAP was inadequate but was corrected by BiPAP 25/20 cm 02/2018 HST >> severe OSA with 76/h and  severe desaturations. [id number]/[id number] -at [facility] 85/hour corrected by CPAP 10 cm  12/1998-weight 3 1 4  pounds-CPAP titration 17 cm  2D echo November 2020 EF 60 to 65%, LVH, grade 2 diastolic dysfunction, mild aortic valve stenosis, moderate calcification of the mitral valve  09/05/2019 Follow up : OSA  Patient presents for a follow-up visit for underlying sleep apnea.  Patient is on nocturnal BiPAP.  She says she is doing well on BiPAP.Patient says that she tries to wear her machine each night.  Feels that it does help some. Says she falls asleep sometimes and does not put on . Also reads for long period of time and drifts off. Discussed healthy sleep regimen.  BiPAP download shows 80% compliance with daily average usage at 5.5 hours.  Patient is on IPAP 25, EPAP 10, pressure support 5 cm H2O.  AHI 6.2.  No Known Allergies  Immunization History  Administered Date(s) Administered  . Pneumococcal Conjugate-13 11/23/2015  . Pneumococcal Polysaccharide-23 05/24/2012  . Td 01/28/2003    Past Medical History:  Diagnosis Date  . Acute bronchospasm 08/24/2009  . Acute lower GI bleeding 02/16/2017  . Acute perforated appendicitis w abscess s/p lap appendectomy 12/30/2017 12/30/2017  . Acute sphenoidal sinusitis 02/26/2010   Qualifier: Diagnosis of  By: Regis Bill MD, Standley Brooking   . CARDIAC MURMUR 12/08/2006  . COLONIC POLYPS, HX OF 12/08/2006  . DIABETES MELLITUS, TYPE II 12/08/2006  .  Gallbladder/common duct stone, without infection, with obstruction 03/01/2010   removed ercp  . HYPERLIPIDEMIA 12/08/2006  . HYPERTENSION 12/08/2006  . 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 INFECTION, SKIN AND SOFT TISSUE 07/28/2008  . 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 LIVER FUNCTION TESTS, ABNORMAL 07/28/2008  . Morbid obesity ([place]) 12/08/2006  . OBESITY 09/24/2009  . OBSTRUCTIVE SLEEP APNEA 12/08/2006  . OSTEOARTHRITIS 12/08/2006  . RUQ PAIN 06/16/2008  . SHOULDER PAIN, RIGHT 02/07/2008  . Sleep apnea   . Swelling of limb 07/28/2008  . THYROID FUNCTION TEST, ABNORMAL 12/08/2006    Tobacco History: Social History   Tobacco Use  Smoking Status Never Smoker  Smokeless Tobacco Never Used   Counseling given: Not Answered   Outpatient Medications Prior to Visit  Medication Sig Dispense Refill  . amLODipine (NORVASC) 5 MG tablet TAKE 1 TABLET BY MOUTH EVERY DAY 90 tablet 0  . chlorthalidone (HYGROTON) 25 MG tablet TAKE 1 TABLET BY MOUTH EVERY DAY 90 tablet 1  . cholecalciferol (VITAMIN D) 1000 UNITS tablet Take 1,000 Units by mouth daily.    Marland Kitchen glucose blood (ACCU-CHEK AVIVA PLUS) test strip Use to check blood sugar daily E11.9 100 each 12  . Lancets (ACCU-CHEK SOFT TOUCH) lancets Use to test blood glucose twice daily 50 each 12  . levothyroxine (SYNTHROID) 75 MCG tablet TAKE 1 TABLET (75 MCG TOTAL) BY MOUTH DAILY BEFORE BREAKFAST. 90 tablet 0  . losartan (COZAAR) 100 MG tablet TAKE 1 TABLET (100 MG TOTAL) BY MOUTH DAILY. MAY BEGIN AT 1/2 TABLET  DAILY AND INCREASE TO 1 TABLET 90 tablet 1  . metoprolol succinate (TOPROL-XL) 25 MG 24 hr tablet Take 1 tablet (25 mg total) by mouth daily. 90 tablet 1  . Misc Natural Products (OSTEO BI-FLEX ADV JOINT SHIELD PO) Take 1 tablet by mouth daily.    . Multiple Vitamins-Minerals (CENTRUM SILVER PO) Take 1 tablet by mouth daily.      . potassium chloride (KLOR-CON) 10 MEQ tablet TAKE 1 TABLET (10 MEQ TOTAL) BY MOUTH 3 (THREE) TIMES DAILY. 90 tablet 5   No  facility-administered medications prior to visit.     Review of Systems:   Constitutional:   No  weight loss, night sweats,  Fevers, chills,  +fatigue, or  lassitude.  HEENT:   No headaches,  Difficulty swallowing,  Tooth/dental problems, or  Sore throat,                No sneezing, itching, ear ache, nasal congestion, post nasal drip,   CV:  No chest pain,  Orthopnea, PND, swelling in lower extremities, anasarca, dizziness, palpitations, syncope.   GI  No heartburn, indigestion, abdominal pain, nausea, vomiting, diarrhea, change in bowel habits, loss of appetite, bloody stools.   Resp:  .  No chest wall deformity  Skin: no rash or lesions.  GU: no dysuria, change in color of urine, no urgency or frequency.  No flank pain, no hematuria   MS:  No joint pain or swelling.  No decreased range of motion.  No back pain.    Physical Exam  BP 124/66 (BP Location: Right Wrist, Cuff Size: Normal)   Pulse 63   Temp 98.2 F (36.8 C) (Temporal)   Ht 5\' 1"  (1.549 m)   Wt 270 lb 12.8 oz (122.8 kg)   SpO2 97%   BMI 51.17 kg/m   GEN: A/Ox3; pleasant , NAD, well nourished    HEENT:  [place]/AT,   NOSE-clear, THROAT-clear, no lesions, no postnasal drip or exudate noted.   NECK:  Supple w/ fair ROM; no JVD; normal carotid impulses w/o bruits; no thyromegaly or nodules palpated; no lymphadenopathy.    RESP  Clear  P & A; w/o, wheezes/ rales/ or rhonchi. no accessory muscle use, no dullness to percussion  CARD:  RRR, no m/r/g, 1+ peripheral edema, pulses intact, no cyanosis or clubbing.  GI:   Soft & nt; nml bowel sounds; no organomegaly or masses detected.   Musco: Warm bil, no deformities or joint swelling noted.   Neuro: alert, no focal deficits noted.    Skin: Warm, no lesions or rashes    Lab Results:  CBC    Component Value Date/Time   WBC 5.7 04/01/2019 1008   RBC 4.38 04/01/2019 1008   HGB 13.1 04/01/2019 1008   HCT 38.7 04/01/2019 1008   PLT 190.0 04/01/2019 1008    MCV 88.6 04/01/2019 1008   MCH 29.3 01/08/2018 0906   MCHC 33.8 04/01/2019 1008   RDW 14.0 04/01/2019 1008   LYMPHSABS 1.7 04/01/2019 1008   MONOABS 0.4 04/01/2019 1008   EOSABS 0.1 04/01/2019 1008   BASOSABS 0.0 04/01/2019 1008    BMET  Imaging: No results found.    No flowsheet data found.  No results found for: NITRICOXIDE      Assessment & Plan:   OSA on CPAP Severe obstructive sleep apnea-controlled on nocturnal BiPAP.  Patient is encouraged on increased usage .  Patient education on using her BiPAP each night for at least 4 to 6 hours Encouraged  on healthy weight loss.  Plan  Patient Instructions  Continue on BIPAP At bedtime  .  Try to wear each night for at least 4-6 hrs.  Work on healthy weight .  Do not drive if sleepy  Recommend COVID vaccine .  Follow up with Dr. Elsworth Soho  In 1 year and As needed        Morbid obesity with BMI of 50.0-59.9, adult ([place]) Healthy weight loss.       Rexene Edison, NP 09/05/2019

## 2019-09-05 NOTE — Assessment & Plan Note (Signed)
Severe obstructive sleep apnea-controlled on nocturnal BiPAP.  Patient is encouraged on increased usage .  Patient education on using her BiPAP each night for at least 4 to 6 hours Encouraged on healthy weight loss.  Plan  Patient Instructions  Continue on BIPAP At bedtime  .  Try to wear each night for at least 4-6 hrs.  Work on healthy weight .  Do not drive if sleepy  Recommend COVID vaccine .  Follow up with Dr. Elsworth Soho  In 1 year and As needed

## 2019-09-05 NOTE — Assessment & Plan Note (Signed)
Healthy weight loss 

## 2019-09-05 NOTE — Patient Instructions (Signed)
Continue on BIPAP At bedtime  .  Try to wear each night for at least 4-6 hrs.  Work on healthy weight .  Do not drive if sleepy  Recommend COVID vaccine .  Follow up with Dr. Elsworth Soho  In 1 year and As needed

## 2019-10-04 ENCOUNTER — Other Ambulatory Visit: Payer: Self-pay | Admitting: Internal Medicine

## 2019-10-20 ENCOUNTER — Encounter: Payer: Self-pay | Admitting: Sports Medicine

## 2019-10-20 ENCOUNTER — Ambulatory Visit: Payer: Medicare HMO | Admitting: Sports Medicine

## 2019-10-20 ENCOUNTER — Other Ambulatory Visit: Payer: Self-pay

## 2019-10-20 DIAGNOSIS — I890 Lymphedema, not elsewhere classified: Secondary | ICD-10-CM | POA: Diagnosis not present

## 2019-10-20 DIAGNOSIS — E119 Type 2 diabetes mellitus without complications: Secondary | ICD-10-CM | POA: Diagnosis not present

## 2019-10-20 DIAGNOSIS — M79609 Pain in unspecified limb: Secondary | ICD-10-CM

## 2019-10-20 DIAGNOSIS — B351 Tinea unguium: Secondary | ICD-10-CM | POA: Diagnosis not present

## 2019-10-20 NOTE — Progress Notes (Signed)
Subjective: Rebecca Rich is a 74 y.o. female patient with history of diabetes who returns to office today complaining of long,mildly painful nails  while ambulating in shoes; unable to trim. Patient reports that she had an episode of swelling and pain in feet a few week back but now its much better. No other pedal complaints noted.   FBS not checked   Patient Active Problem List   Diagnosis Date Noted  . Numbness and tingling of left leg 04/05/2018  . Influenza vaccination declined by patient 04/05/2018  . SVT (supraventricular tachycardia) ([place]) 12/31/2017  . Diverticulosis of intestine with bleeding   . Rectal bleeding   . Hx of gout 08/21/2015  . Mild aortic stenosis 08/21/2015  . Muscle spasm 03/22/2013  . Skin lesion of back 09/13/2012  . Left shoulder pain 01/20/2012  . Hypothyroid 05/31/2011  . Elevated uric acid in blood 05/31/2011  . Palmar nodule 03/25/2011  . Abnormal urine 02/20/2011  . 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 SHOULDER PAIN, RIGHT 02/07/2008  . Diabetes mellitus type 2 in obese ([place]) 12/08/2006  . HYPERLIPIDEMIA 12/08/2006  . Morbid obesity with BMI of 50.0-59.9, adult ([place]) 12/08/2006  . OSA on CPAP 12/08/2006  . Essential hypertension 12/08/2006  . OSTEOARTHRITIS 12/08/2006  . THYROID FUNCTION TEST, ABNORMAL 12/08/2006  . COLONIC POLYPS, HX OF 12/08/2006   Current Outpatient Medications on File Prior to Visit  Medication Sig Dispense Refill  . amLODipine (NORVASC) 5 MG tablet TAKE 1 TABLET BY MOUTH EVERY DAY 90 tablet 0  . chlorthalidone (HYGROTON) 25 MG tablet TAKE 1 TABLET BY MOUTH EVERY DAY 90 tablet 1  . cholecalciferol (VITAMIN D) 1000 UNITS tablet Take 1,000 Units by mouth daily.    Marland Kitchen glucose blood (ACCU-CHEK AVIVA PLUS) test strip Use to check blood sugar daily E11.9 100 each 12  . Lancets (ACCU-CHEK SOFT TOUCH) lancets Use to test blood glucose twice daily 50 each 12  . levothyroxine (SYNTHROID) 75 MCG tablet TAKE 1  TABLET (75 MCG TOTAL) BY MOUTH DAILY BEFORE BREAKFAST. 90 tablet 0  . losartan (COZAAR) 100 MG tablet TAKE 1 TABLET (100 MG TOTAL) BY MOUTH DAILY. MAY BEGIN AT 1/2 TABLET DAILY AND INCREASE TO 1 TABLET 90 tablet 1  . metoprolol succinate (TOPROL-XL) 25 MG 24 hr tablet Take 1 tablet (25 mg total) by mouth daily. 90 tablet 1  . Misc Natural Products (OSTEO BI-FLEX ADV JOINT SHIELD PO) Take 1 tablet by mouth daily.    . Multiple Vitamins-Minerals (CENTRUM SILVER PO) Take 1 tablet by mouth daily.      . potassium chloride (KLOR-CON) 10 MEQ tablet TAKE 1 TABLET (10 MEQ TOTAL) BY MOUTH 3 (THREE) TIMES DAILY. 90 tablet 5   No current facility-administered medications on file prior to visit.   No Known Allergies  Recent Results (from the past 2160 hour(s))  Basic metabolic panel     Status: Abnormal   Collection Time: 08/02/19  9:52 AM  Result Value Ref Range   Sodium 141 135 - 145 mEq/L   Potassium 3.5 3.5 - 5.1 mEq/L   Chloride 106 96 - 112 mEq/L   CO2 25 19 - 32 mEq/L   Glucose, Bld 134 (H) 70 - 99 mg/dL   BUN 38 (H) 6 - 23 mg/dL   Creatinine, Ser 1.20 0.40 - 1.20 mg/dL   GFR 52.81 (L) >60.00 mL/min   Calcium 9.5 8.4 - 10.5 mg/dL  Hemoglobin A1c     Status: Abnormal  Collection Time: 08/02/19  9:52 AM  Result Value Ref Range   Hgb A1c MFr Bld 7.0 (H) 4.6 - 6.5 %    Comment: Glycemic Control Guidelines for People with Diabetes:Non Diabetic:  <6%Goal of Therapy: <7%Additional Action Suggested:  >8%     Objective: General: Patient is awake, alert, and oriented x 3 and in no acute distress.  Integument: Skin is warm, dry and supple bilateral. Nails are tender, long, thickened and dystrophic with subungual debris, consistent with onychomycosis, 1-5 bilateral.  No signs of infection. No open lesions or preulcerative lesions present bilateral. Remaining integument unremarkable. No signs of tinea. Minimal dry skin on heels.  Vasculature:  Dorsalis Pedis pulse 2/4 bilateral. Posterior Tibial  pulse 0/4 bilateral, chronic edema today to ankles. Capillary fill time <3 sec 1-5 bilateral. +hyperpigmentation, Positive hair growth to the level of the digits.Temperature gradient within normal limits.   Neurology: The patient has intact sensation measured with a 5.07/10g Semmes Weinstein Monofilament at all pedal sites bilateral as previously noted.  Musculoskeletal:Asymptomatic pes pedal deformities noted bilateral. Muscular strength 5/5 in all lower extremity muscular groups bilateral without pain on range of motion . No tenderness  Bilateral. Previous foot pain has resolved.   Assessment and Plan: Problem List Items Addressed This Visit    None    Visit Diagnoses    Pain due to onychomycosis of nail    -  Primary   Diabetes mellitus without complication (HCC)       Lymphedema         -Examined patient. -Re-Discussed and educated patient on diabetic foot care, especially with regards to the vascular, neurological and musculoskeletal systems.  -Mechanically debrided all nails 1-5 bilateral using sterile nail nipper and filed with dremel without incident  -Answered all patient questions -Patient to return  in 3 months for at risk foot care and advised patient if she has a flare in foot pain again and would like a virtual visit may call to request -Patient advised to call the office if any problems or questions arise in the meantime.  Landis Martins, DPM

## 2019-11-16 ENCOUNTER — Telehealth: Payer: Self-pay | Admitting: Internal Medicine

## 2019-11-16 NOTE — Telephone Encounter (Signed)
NO ANSWER/NO VM. Pt due to schedule Medicare Annual Wellness Visit (AWV) either virtually or in office.  Last AWV 01/08/15; please schedule at anytime with [facility] Nurse Health Advisor 2.  This should be a 45 minute visit.

## 2019-11-17 ENCOUNTER — Other Ambulatory Visit: Payer: Self-pay | Admitting: Internal Medicine

## 2019-12-05 ENCOUNTER — Ambulatory Visit: Payer: Medicare HMO | Admitting: Internal Medicine

## 2019-12-05 ENCOUNTER — Encounter: Payer: Self-pay | Admitting: Internal Medicine

## 2019-12-05 ENCOUNTER — Other Ambulatory Visit: Payer: Self-pay

## 2019-12-05 VITALS — BP 118/66 | HR 88 | Temp 98.4°F | Ht 61.0 in | Wt 261.0 lb

## 2019-12-05 DIAGNOSIS — Z79899 Other long term (current) drug therapy: Secondary | ICD-10-CM

## 2019-12-05 DIAGNOSIS — Z0000 Encounter for general adult medical examination without abnormal findings: Secondary | ICD-10-CM

## 2019-12-05 DIAGNOSIS — I10 Essential (primary) hypertension: Secondary | ICD-10-CM | POA: Diagnosis not present

## 2019-12-05 DIAGNOSIS — E1169 Type 2 diabetes mellitus with other specified complication: Secondary | ICD-10-CM | POA: Diagnosis not present

## 2019-12-05 DIAGNOSIS — Z7189 Other specified counseling: Secondary | ICD-10-CM

## 2019-12-05 DIAGNOSIS — E669 Obesity, unspecified: Secondary | ICD-10-CM | POA: Diagnosis not present

## 2019-12-05 DIAGNOSIS — G4733 Obstructive sleep apnea (adult) (pediatric): Secondary | ICD-10-CM

## 2019-12-05 DIAGNOSIS — Z9989 Dependence on other enabling machines and devices: Secondary | ICD-10-CM

## 2019-12-05 DIAGNOSIS — E039 Hypothyroidism, unspecified: Secondary | ICD-10-CM

## 2019-12-05 DIAGNOSIS — E785 Hyperlipidemia, unspecified: Secondary | ICD-10-CM

## 2019-12-05 DIAGNOSIS — Z23 Encounter for immunization: Secondary | ICD-10-CM

## 2019-12-05 DIAGNOSIS — Z6841 Body Mass Index (BMI) 40.0 and over, adult: Secondary | ICD-10-CM

## 2019-12-05 LAB — POCT GLYCOSYLATED HEMOGLOBIN (HGB A1C)
HbA1c POC (<> result, manual entry): 6.7 % (ref 4.0–5.6)
Hemoglobin A1C: 6.7 % — AB (ref 4.0–5.6)

## 2019-12-05 NOTE — Patient Instructions (Addendum)
Blood sugar is better . Continue healthy weight loss.  Care of feet as best possible.   BP is good today .   Please  get back with Dr.  Collene Mares about the  Blood in stool even though went away .    Plan  rov in march and plan updated labs at that time .   Shingles vaccine t your pharmacy  Shingrix    Health Maintenance, Female Adopting a healthy lifestyle and getting preventive care are important in promoting health and wellness. Ask your health care provider about:  The right schedule for you to have regular tests and exams.  Things you can do on your own to prevent diseases and keep yourself healthy. What should I know about diet, weight, and exercise? Eat a healthy diet   Eat a diet that includes plenty of vegetables, fruits, low-fat dairy products, and lean protein.  Do not eat a lot of foods that are high in solid fats, added sugars, or sodium. Maintain a healthy weight Body mass index (BMI) is used to identify weight problems. It estimates body fat based on height and weight. Your health care provider can help determine your BMI and help you achieve or maintain a healthy weight. Get regular exercise Get regular exercise. This is one of the most important things you can do for your health. Most adults should:  Exercise for at least 150 minutes each week. The exercise should increase your heart rate and make you sweat (moderate-intensity exercise).  Do strengthening exercises at least twice a week. This is in addition to the moderate-intensity exercise.  Spend less time sitting. Even light physical activity can be beneficial. Watch cholesterol and blood lipids Have your blood tested for lipids and cholesterol at 76 years of age, then have this test every 5 years. Have your cholesterol levels checked more often if:  Your lipid or cholesterol levels are high.  You are older than 76 years of age.  You are at high risk for heart disease. What should I know about cancer  screening? Depending on your health history and family history, you may need to have cancer screening at various ages. This may include screening for:  Breast cancer.  Cervical cancer.  Colorectal cancer.  Skin cancer.  Lung cancer. What should I know about heart disease, diabetes, and high blood pressure? Blood pressure and heart disease  High blood pressure causes heart disease and increases the risk of stroke. This is more likely to develop in people who have high blood pressure readings, are of African descent, or are overweight.  Have your blood pressure checked: ? Every 3-5 years if you are 66-62 years of age. ? Every year if you are 73 years old or older. Diabetes Have regular diabetes screenings. This checks your fasting blood sugar level. Have the screening done:  Once every three years after age 93 if you are at a normal weight and have a low risk for diabetes.  More often and at a younger age if you are overweight or have a high risk for diabetes. What should I know about preventing infection? Hepatitis B If you have a higher risk for hepatitis B, you should be screened for this virus. Talk with your health care provider to find out if you are at risk for hepatitis B infection. Hepatitis C Testing is recommended for:  Everyone born from [birth year] through 1967.  Anyone with known risk factors for hepatitis C. Sexually transmitted infections (STIs)  Get screened for  STIs, including gonorrhea and chlamydia, if: ? You are sexually active and are younger than 77 years of age. ? You are older than 76 years of age and your health care provider tells you that you are at risk for this type of infection. ? Your sexual activity has changed since you were last screened, and you are at increased risk for chlamydia or gonorrhea. Ask your health care provider if you are at risk.  Ask your health care provider about whether you are at high risk for HIV. Your health care provider may  recommend a prescription medicine to help prevent HIV infection. If you choose to take medicine to prevent HIV, you should first get tested for HIV. You should then be tested every 3 months for as long as you are taking the medicine. Pregnancy  If you are about to stop having your period (premenopausal) and you may become pregnant, seek counseling before you get pregnant.  Take 400 to 800 micrograms (mcg) of folic acid every day if you become pregnant.  Ask for birth control (contraception) if you want to prevent pregnancy. Osteoporosis and menopause Osteoporosis is a disease in which the bones lose minerals and strength with aging. This can result in bone fractures. If you are 30 years old or older, or if you are at risk for osteoporosis and fractures, ask your health care provider if you should:  Be screened for bone loss.  Take a calcium or vitamin D supplement to lower your risk of fractures.  Be given hormone replacement therapy (HRT) to treat symptoms of menopause. Follow these instructions at home: Lifestyle  Do not use any products that contain nicotine or tobacco, such as cigarettes, e-cigarettes, and chewing tobacco. If you need help quitting, ask your health care provider.  Do not use street drugs.  Do not share needles.  Ask your health care provider for help if you need support or information about quitting drugs. Alcohol use  Do not drink alcohol if: ? Your health care provider tells you not to drink. ? You are pregnant, may be pregnant, or are planning to become pregnant.  If you drink alcohol: ? Limit how much you use to 0-1 drink a day. ? Limit intake if you are breastfeeding.  Be aware of how much alcohol is in your drink. In the [place] one drink equals one 12 oz bottle of beer (355 mL), one 5 oz glass of wine (148 mL), or one 1 oz glass of hard liquor (44 mL). General instructions  Schedule regular health, dental, and eye exams.  Stay current with your  vaccines.  Tell your health care provider if: ? You often feel depressed. ? You have ever been abused or do not feel safe at home. Summary  Adopting a healthy lifestyle and getting preventive care are important in promoting health and wellness.  Follow your health care provider's instructions about healthy diet, exercising, and getting tested or screened for diseases.  Follow your health care provider's instructions on monitoring your cholesterol and blood pressure. This information is not intended to replace advice given to you by your health care provider. Make sure you discuss any questions you have with your health care provider. Document Revised: 01/06/2018 Document Reviewed: 01/06/2018 Elsevier Patient Education  2020 [employer].

## 2019-12-05 NOTE — Progress Notes (Signed)
Chief Complaint  Patient presents with  . Annual Exam  . Medication Management  . Diabetes    HPI: Rebecca Rich 74 y.o. comes in today for Preventive Medicare exam/ wellness visit .Since last visit.   Doing pretty well worki ngon weight   At home  Doesn't travel at this time    Dental   Dental  Grafts.   Sees podiatry  No new sx   No numbness new  Or vision changes  BP needs refill  Thyroid   Nl  Had an episode of blood in stool mixed and stopped so didn't  See Dr Collene Mares  Health Maintenance  Topic Date Due  . DEXA SCAN  Never done  . COVID-19 Vaccine (1) 01/03/2020 (Originally 04/13/1957)  . INFLUENZA VACCINE  01/30/2020 (Originally 08/28/2019)  . FOOT EXAM  03/31/2020  . HEMOGLOBIN A1C  06/03/2020  . OPHTHALMOLOGY EXAM  08/01/2020  . TETANUS/TDAP  12/04/2029  . Hepatitis C Screening  Completed  . PNA vac Low Risk Adult  Completed   Health Maintenance Review LIFESTYLE:  Exercise:   No special  Tobacco/ETS:n Alcohol:  Sugar beverages:n Sleep:osa Drug use: no HH:1      Hearing: ok  Vision:  No limitations at present . Last eye check UTD  Safety:  Has smoke detector and wears seat belts.  No excess sun exposure. Sees dentist regularly.  Falls:  no  Memory: Felt to be ok   , no concern from her or her family.  Depression: No anhedonia unusual crying or depressive symptoms  Nutrition: Eats well balanced diet; adequate calcium and vitamin D. No swallowing chewing problems.  Injury: no major injuries in the last six months.  Other healthcare providers:  Reviewed today .  Preventive parameters:  Reviewed  Willing to get covid vaccine at some point doesn't want to go out   ADLS:   There are no problems or need for assistance  driving, feeding, obtaining food, dressing, toileting and bathing, managing money using phone. She is independent.  ROS:  GEN/ HEENT: No fever, significant weight changes sweats headaches vision problems hearing changes, CV/  PULM; No chest pain shortness of breath cough, syncope,edema  change in exercise tolerance. GI /GU: No adominal pain, vomiting, change in bowel habits. No blood in the stool. No significant GU symptoms. SKIN/HEME: ,no acute skin rashes suspicious lesions or bleeding. No lymphadenopathy, nodules, masses.  NEURO/ PSYCH:  No neurologic signs such as weakness numbness. No depression anxiety. IMM/ Allergy: No unusual infections.  Allergy .   REST of 12 system review negative except as per HPI   Past Medical History:  Diagnosis Date  . Acute bronchospasm 08/24/2009  . Acute lower GI bleeding 02/16/2017  . Acute perforated appendicitis w abscess s/p lap appendectomy 12/30/2017 12/30/2017  . Acute sphenoidal sinusitis 02/26/2010   Qualifier: Diagnosis of  By: Regis Bill MD, Standley Brooking   . CARDIAC MURMUR 12/08/2006  . COLONIC POLYPS, HX OF 12/08/2006  . DIABETES MELLITUS, TYPE II 12/08/2006  . Gallbladder/common duct stone, without infection, with obstruction 03/01/2010   removed ercp  . HYPERLIPIDEMIA 12/08/2006  . HYPERTENSION 12/08/2006  . 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 INFECTION, SKIN AND SOFT TISSUE 07/28/2008  . 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 LIVER FUNCTION TESTS, ABNORMAL 07/28/2008  . Morbid obesity ([place]) 12/08/2006  . OBESITY 09/24/2009  . OBSTRUCTIVE SLEEP APNEA 12/08/2006  . OSTEOARTHRITIS 12/08/2006  . RUQ PAIN 06/16/2008  . SHOULDER PAIN, RIGHT 02/07/2008  . Sleep apnea   . Swelling  of limb 07/28/2008  . THYROID FUNCTION TEST, ABNORMAL 12/08/2006    Family History  Problem Relation Age of Onset  . Other Mother        blood clots  . Cervical cancer Mother   . Cancer Mother   . Heart disease Brother   . Diabetes Brother   . Heart disease Sister   . Diabetes Sister   . Heart disease Brother   . Diabetes Brother   . Heart disease Brother   . Diabetes Brother   . Stroke Sister   . Diabetes Sister   . Throat cancer Father   . Cancer Father   . Diabetes Other        all siblings, 4 brothers, 5  sisters    Social History   Socioeconomic History  . Marital status: Divorced    Spouse name: Not on file  . Number of children: Not on file  . Years of education: Not on file  . Highest education level: Not on file  Occupational History  . Occupation: retired    Fish farm manager: RETIRED  Tobacco Use  . Smoking status: Never Smoker  . Smokeless tobacco: Never Used  Vaping Use  . Vaping Use: Never used  Substance and Sexual Activity  . Alcohol use: No    Alcohol/week: 0.0 standard drinks  . Drug use: No  . Sexual activity: Not on file  Other Topics Concern  . Not on file  Social History Narrative   Master level education in math   Pt is currently retired   Pt is divorced with children   Recently had to move had a break in and thus away from her pool exercise   Social Determinants of Health   Financial Resource Strain:   . Difficulty of Paying Living Expenses: Not on file  Food Insecurity:   . Worried About Charity fundraiser in the Last Year: Not on file  . Ran Out of Food in the Last Year: Not on file  Transportation Needs:   . Lack of Transportation (Medical): Not on file  . Lack of Transportation (Non-Medical): Not on file  Physical Activity:   . Days of Exercise per Week: Not on file  . Minutes of Exercise per Session: Not on file  Stress:   . Feeling of Stress : Not on file  Social Connections:   . Frequency of Communication with Friends and Family: Not on file  . Frequency of Social Gatherings with Friends and Family: Not on file  . Attends Religious Services: Not on file  . Active Member of Clubs or Organizations: Not on file  . Attends Archivist Meetings: Not on file  . Marital Status: Not on file    Outpatient Encounter Medications as of 12/05/2019  Medication Sig  . amLODipine (NORVASC) 5 MG tablet TAKE 1 TABLET BY MOUTH EVERY DAY  . chlorthalidone (HYGROTON) 25 MG tablet TAKE 1 TABLET BY MOUTH EVERY DAY  . cholecalciferol (VITAMIN D) 1000  UNITS tablet Take 1,000 Units by mouth daily.  Marland Kitchen glucose blood (ACCU-CHEK AVIVA PLUS) test strip Use to check blood sugar daily E11.9  . Lancets (ACCU-CHEK SOFT TOUCH) lancets Use to test blood glucose twice daily  . levothyroxine (SYNTHROID) 75 MCG tablet TAKE 1 TABLET (75 MCG TOTAL) BY MOUTH DAILY BEFORE BREAKFAST.  Marland Kitchen losartan (COZAAR) 100 MG tablet TAKE 1 TABLET (100 MG TOTAL) BY MOUTH DAILY. MAY BEGIN AT 1/2 TABLET DAILY AND INCREASE TO 1 TABLET  . metoprolol succinate (  TOPROL-XL) 25 MG 24 hr tablet Take 1 tablet (25 mg total) by mouth daily.  . Misc Natural Products (OSTEO BI-FLEX ADV JOINT SHIELD PO) Take 1 tablet by mouth daily.  . Multiple Vitamins-Minerals (CENTRUM SILVER PO) Take 1 tablet by mouth daily.    . potassium chloride (KLOR-CON) 10 MEQ tablet TAKE 1 TABLET (10 MEQ TOTAL) BY MOUTH 3 (THREE) TIMES DAILY.   No facility-administered encounter medications on file as of 12/05/2019.    EXAM:  BP 118/66   Pulse 88   Temp 98.4 F (36.9 C) (Oral)   Ht 5\' 1"  (1.549 m)   Wt 261 lb (118.4 kg)   SpO2 97%   BMI 49.32 kg/m   Body mass index is 49.32 kg/m. Wt Readings from Last 3 Encounters:  12/05/19 261 lb (118.4 kg)  09/05/19 270 lb 12.8 oz (122.8 kg)  08/02/19 269 lb 3.2 oz (122.1 kg)    Physical Exam: Vital signs reviewed UKG:URKY is a well-developed well-nourished alert cooperative   who appears stated age in no acute distress.  HEENT: normocephalic atraumatic , Eyes: PERRL EOM's full, conjunctiva clear, Nares: paten,t no deformity discharge or tenderness., Ears: no deformity EAC's clear TMs with normal landmarks. Mouth:masked  NECK: supple without masses, thyromegaly or bruits   CHEST/PULM:  Clear to auscultation and percussion breath sounds equal no wheeze , rales or rhonchi. Breast: normal by inspection . No dimpling, discharge, masses, tenderness or discharge . CV: PMI is nondisplaced, S1 S2 no gallops, murmurs, rubs. Peripheral pulses are full without delay.No JVD  .  ABDOMEN: Bowel sounds normal nontender  No guard or rebound, no hepato splenomegal no CVA tenderness.   Extremtities:  No clubbing cyanosis or edema, no acute joint swelling or redness no focal atrophy NEURO:  Oriented x3, cranial nerves 3-12 appear to be intact, no obvious focal weakness,gait within normal limits SKIN: No acute rashes normal turgor, color, no bruising or petechiae. Keloids   PSYCH: Oriented, good eye contact, no obvious depression anxiety, cognition and judgment appear normal. LN: no cervical axillary inguinal adenopathy No noted deficits in memory, attention, and speech. Feet  Thickened   Toenails  Dry cracking but no sore or ulcer s   Lab Results  Component Value Date   WBC 5.7 04/01/2019   HGB 13.1 04/01/2019   HCT 38.7 04/01/2019   PLT 190.0 04/01/2019   GLUCOSE 134 (H) 08/02/2019   CHOL 166 04/01/2019   TRIG 130.0 04/01/2019   HDL 51.90 04/01/2019   LDLCALC 88 04/01/2019   ALT 13 04/01/2019   AST 14 04/01/2019   NA 141 08/02/2019   K 3.5 08/02/2019   CL 106 08/02/2019   CREATININE 1.20 08/02/2019   BUN 38 (H) 08/02/2019   CO2 25 08/02/2019   TSH 3.58 04/01/2019   HGBA1C 6.7 (A) 12/05/2019   HGBA1C 6.7 12/05/2019   MICROALBUR 1.0 04/01/2019    ASSESSMENT AND PLAN:  Discussed the following assessment and plan:  Visit for preventive health examination  Medication management  Diabetes mellitus type 2 in obese ([place]) - improve a1c  - Plan: POCT HgB A1C  Essential hypertension  OSA on CPAP  Need for Tdap vaccination - Plan: Tdap vaccine greater than or equal to 7yo IM  BMI 45.0-49.9, adult (HCC)  Hypothyroidism, unspecified type  Hyperlipidemia, unspecified hyperlipidemia type  Counseled about COVID-19 virus infection - and vaccine  Showed how to make appt for  covid vaccine  [facility]  [facility] and  [facility]  An  discu  Patient Care Team: Yaxiel Minnie, Standley Brooking, MD as PCP - General Fay Records, MD as PCP - Cardiology (Cardiology) Alphonsa Overall, MD (General Surgery) Carol Ada, MD (Gastroenterology) Gean Birchwood, DPM (Podiatry) Adrian Prows, MD as Consulting Physician (Cardiology) Juanita Craver, MD as Consulting Physician (Gastroenterology) Zadie Rhine Clent Demark, MD as Consulting Physician (Ophthalmology)  Patient Instructions  Blood sugar is better . Continue healthy weight loss.  Care of feet as best possible.   BP is good today .   Please  get back with Dr.  Collene Mares about the  Blood in stool even though went away .    Plan  rov in march and plan updated labs at that time .   Shingles vaccine t your pharmacy  Shingrix    Health Maintenance, Female Adopting a healthy lifestyle and getting preventive care are important in promoting health and wellness. Ask your health care provider about:  The right schedule for you to have regular tests and exams.  Things you can do on your own to prevent diseases and keep yourself healthy. What should I know about diet, weight, and exercise? Eat a healthy diet   Eat a diet that includes plenty of vegetables, fruits, low-fat dairy products, and lean protein.  Do not eat a lot of foods that are high in solid fats, added sugars, or sodium. Maintain a healthy weight Body mass index (BMI) is used to identify weight problems. It estimates body fat based on height and weight. Your health care provider can help determine your BMI and help you achieve or maintain a healthy weight. Get regular exercise Get regular exercise. This is one of the most important things you can do for your health. Most adults should:  Exercise for at least 150 minutes each week. The exercise should increase your heart rate and make you sweat (moderate-intensity exercise).  Do strengthening exercises at least twice a week. This is in addition to the moderate-intensity exercise.  Spend less time sitting. Even light physical activity can be beneficial. Watch cholesterol and blood lipids Have your blood tested  for lipids and cholesterol at 76 years of age, then have this test every 5 years. Have your cholesterol levels checked more often if:  Your lipid or cholesterol levels are high.  You are older than 76 years of age.  You are at high risk for heart disease. What should I know about cancer screening? Depending on your health history and family history, you may need to have cancer screening at various ages. This may include screening for:  Breast cancer.  Cervical cancer.  Colorectal cancer.  Skin cancer.  Lung cancer. What should I know about heart disease, diabetes, and high blood pressure? Blood pressure and heart disease  High blood pressure causes heart disease and increases the risk of stroke. This is more likely to develop in people who have high blood pressure readings, are of African descent, or are overweight.  Have your blood pressure checked: ? Every 3-5 years if you are 52-48 years of age. ? Every year if you are 58 years old or older. Diabetes Have regular diabetes screenings. This checks your fasting blood sugar level. Have the screening done:  Once every three years after age 79 if you are at a normal weight and have a low risk for diabetes.  More often and at a younger age if you are overweight or have a high risk for diabetes. What should I know about preventing infection? Hepatitis B If  you have a higher risk for hepatitis B, you should be screened for this virus. Talk with your health care provider to find out if you are at risk for hepatitis B infection. Hepatitis C Testing is recommended for:  Everyone born from [birth year] through 1967.  Anyone with known risk factors for hepatitis C. Sexually transmitted infections (STIs)  Get screened for STIs, including gonorrhea and chlamydia, if: ? You are sexually active and are younger than 76 years of age. ? You are older than 76 years of age and your health care provider tells you that you are at risk for this type  of infection. ? Your sexual activity has changed since you were last screened, and you are at increased risk for chlamydia or gonorrhea. Ask your health care provider if you are at risk.  Ask your health care provider about whether you are at high risk for HIV. Your health care provider may recommend a prescription medicine to help prevent HIV infection. If you choose to take medicine to prevent HIV, you should first get tested for HIV. You should then be tested every 3 months for as long as you are taking the medicine. Pregnancy  If you are about to stop having your period (premenopausal) and you may become pregnant, seek counseling before you get pregnant.  Take 400 to 800 micrograms (mcg) of folic acid every day if you become pregnant.  Ask for birth control (contraception) if you want to prevent pregnancy. Osteoporosis and menopause Osteoporosis is a disease in which the bones lose minerals and strength with aging. This can result in bone fractures. If you are 68 years old or older, or if you are at risk for osteoporosis and fractures, ask your health care provider if you should:  Be screened for bone loss.  Take a calcium or vitamin D supplement to lower your risk of fractures.  Be given hormone replacement therapy (HRT) to treat symptoms of menopause. Follow these instructions at home: Lifestyle  Do not use any products that contain nicotine or tobacco, such as cigarettes, e-cigarettes, and chewing tobacco. If you need help quitting, ask your health care provider.  Do not use street drugs.  Do not share needles.  Ask your health care provider for help if you need support or information about quitting drugs. Alcohol use  Do not drink alcohol if: ? Your health care provider tells you not to drink. ? You are pregnant, may be pregnant, or are planning to become pregnant.  If you drink alcohol: ? Limit how much you use to 0-1 drink a day. ? Limit intake if you are  breastfeeding.  Be aware of how much alcohol is in your drink. In the [place] one drink equals one 12 oz bottle of beer (355 mL), one 5 oz glass of wine (148 mL), or one 1 oz glass of hard liquor (44 mL). General instructions  Schedule regular health, dental, and eye exams.  Stay current with your vaccines.  Tell your health care provider if: ? You often feel depressed. ? You have ever been abused or do not feel safe at home. Summary  Adopting a healthy lifestyle and getting preventive care are important in promoting health and wellness.  Follow your health care provider's instructions about healthy diet, exercising, and getting tested or screened for diseases.  Follow your health care provider's instructions on monitoring your cholesterol and blood pressure. This information is not intended to replace advice given to you by your health care provider.  Make sure you discuss any questions you have with your health care provider. Document Revised: 01/06/2018 Document Reviewed: 01/06/2018 Elsevier Patient Education  2020 Silvana Alarik Radu M.D.

## 2019-12-14 ENCOUNTER — Other Ambulatory Visit: Payer: Self-pay | Admitting: Internal Medicine

## 2020-01-19 ENCOUNTER — Ambulatory Visit: Payer: Medicare HMO | Admitting: Sports Medicine

## 2020-02-23 ENCOUNTER — Encounter: Payer: Self-pay | Admitting: Sports Medicine

## 2020-02-23 ENCOUNTER — Ambulatory Visit (INDEPENDENT_AMBULATORY_CARE_PROVIDER_SITE_OTHER): Payer: Medicare HMO | Admitting: Sports Medicine

## 2020-02-23 ENCOUNTER — Other Ambulatory Visit: Payer: Self-pay

## 2020-02-23 DIAGNOSIS — I890 Lymphedema, not elsewhere classified: Secondary | ICD-10-CM

## 2020-02-23 DIAGNOSIS — B351 Tinea unguium: Secondary | ICD-10-CM | POA: Diagnosis not present

## 2020-02-23 DIAGNOSIS — M79609 Pain in unspecified limb: Secondary | ICD-10-CM | POA: Diagnosis not present

## 2020-02-23 DIAGNOSIS — E119 Type 2 diabetes mellitus without complications: Secondary | ICD-10-CM | POA: Diagnosis not present

## 2020-02-23 NOTE — Progress Notes (Signed)
Subjective: Rebecca Rich is a 75 y.o. female patient with history of diabetes who returns to office today complaining of long,mildly painful nails  while ambulating in shoes; unable to trim. Patient reports that she had an episode of swelling at ankles and had an episode of a fall on her side. No other issues noted.  FBS not checked. Last A1C less than 6 and last PCP visit was in Nov.   Patient Active Problem List   Diagnosis Date Noted  . Numbness and tingling of left leg 04/05/2018  . Influenza vaccination declined by patient 04/05/2018  . SVT (supraventricular tachycardia) ([place]) 12/31/2017  . Diverticulosis of intestine with bleeding   . Rectal bleeding   . Hx of gout 08/21/2015  . Mild aortic stenosis 08/21/2015  . Muscle spasm 03/22/2013  . Skin lesion of back 09/13/2012  . Left shoulder pain 01/20/2012  . Hypothyroid 05/31/2011  . Elevated uric acid in blood 05/31/2011  . Palmar nodule 03/25/2011  . Abnormal urine 02/20/2011  . 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 SHOULDER PAIN, RIGHT 02/07/2008  . Diabetes mellitus type 2 in obese ([place]) 12/08/2006  . HYPERLIPIDEMIA 12/08/2006  . Morbid obesity with BMI of 50.0-59.9, adult ([place]) 12/08/2006  . OSA on CPAP 12/08/2006  . Essential hypertension 12/08/2006  . OSTEOARTHRITIS 12/08/2006  . THYROID FUNCTION TEST, ABNORMAL 12/08/2006  . COLONIC POLYPS, HX OF 12/08/2006   Current Outpatient Medications on File Prior to Visit  Medication Sig Dispense Refill  . amLODipine (NORVASC) 5 MG tablet TAKE 1 TABLET BY MOUTH EVERY DAY 90 tablet 0  . chlorthalidone (HYGROTON) 25 MG tablet TAKE 1 TABLET BY MOUTH EVERY DAY 90 tablet 1  . cholecalciferol (VITAMIN D) 1000 UNITS tablet Take 1,000 Units by mouth daily.    Marland Kitchen glucose blood (ACCU-CHEK AVIVA PLUS) test strip Use to check blood sugar daily E11.9 100 each 12  . Lancets (ACCU-CHEK SOFT TOUCH) lancets Use to test blood glucose twice daily 50 each 12  . levothyroxine  (SYNTHROID) 75 MCG tablet TAKE 1 TABLET (75 MCG TOTAL) BY MOUTH DAILY BEFORE BREAKFAST. 90 tablet 0  . losartan (COZAAR) 100 MG tablet TAKE 1 TABLET (100 MG TOTAL) BY MOUTH DAILY. MAY BEGIN AT 1/2 TABLET DAILY AND INCREASE TO 1 TABLET 90 tablet 1  . metoprolol succinate (TOPROL-XL) 25 MG 24 hr tablet Take 1 tablet (25 mg total) by mouth daily. 90 tablet 1  . Misc Natural Products (OSTEO BI-FLEX ADV JOINT SHIELD PO) Take 1 tablet by mouth daily.    . Multiple Vitamins-Minerals (CENTRUM SILVER PO) Take 1 tablet by mouth daily.      . potassium chloride (KLOR-CON) 10 MEQ tablet TAKE 1 TABLET (10 MEQ TOTAL) BY MOUTH 3 (THREE) TIMES DAILY. 90 tablet 5   No current facility-administered medications on file prior to visit.   No Known Allergies  Recent Results (from the past 2160 hour(s))  POCT HgB A1C     Status: Abnormal   Collection Time: 12/05/19 10:03 AM  Result Value Ref Range   Hemoglobin A1C 6.7 (A) 4.0 - 5.6 %   HbA1c POC (<> result, manual entry) 6.7 4.0 - 5.6 %   HbA1c, POC (prediabetic range)     HbA1c, POC (controlled diabetic range)      Objective: General: Patient is awake, alert, and oriented x 3 and in no acute distress.  Integument: Skin is warm, dry and supple bilateral. Nails are tender, long, thickened and dystrophic with subungual debris, consistent  with onychomycosis, 1-5 bilateral.  No signs of infection. No open lesions or preulcerative lesions present bilateral. Remaining integument unremarkable. No signs of tinea. Minimal dry skin on heels.  Vasculature:  Dorsalis Pedis pulse 1/4 bilateral. Posterior Tibial pulse 0/4 bilateral, chronic edema today to ankles. Capillary fill time <3 sec 1-5 bilateral. +hyperpigmentation, Positive hair growth to the level of the digits.Temperature gradient within normal limits.   Neurology: The patient has intact sensation measured with a 5.07/10g Semmes Weinstein Monofilament at all pedal sites bilateral as previously  noted.  Musculoskeletal:Asymptomatic pes pedal deformities noted bilateral. Muscular strength 5/5 in all lower extremity muscular groups bilateral without pain on range of motion . No tenderness  Bilateral. Previous foot pain has resolved.   Assessment and Plan: Problem List Items Addressed This Visit   None   Visit Diagnoses    Pain due to onychomycosis of nail    -  Primary   Diabetes mellitus without complication (HCC)       Lymphedema         -Examined patient. -Re-Discussed and educated patient on diabetic foot care, especially with regards to the vascular, neurological and musculoskeletal systems.  -Mechanically debrided all nails 1-5 bilateral using sterile nail nipper and filed with dremel without incident  -Encourage elevation of legs to assist with edema control -Answered all patient questions -Patient to return  in 3 months for at risk foot care.  Landis Martins, DPM

## 2020-03-07 ENCOUNTER — Ambulatory Visit (INDEPENDENT_AMBULATORY_CARE_PROVIDER_SITE_OTHER): Payer: Medicare HMO

## 2020-03-07 ENCOUNTER — Other Ambulatory Visit: Payer: Self-pay

## 2020-03-07 DIAGNOSIS — Z0000 Encounter for general adult medical examination without abnormal findings: Secondary | ICD-10-CM

## 2020-03-07 NOTE — Patient Instructions (Addendum)
Rebecca Rich , Thank you for taking time to come for your Medicare Wellness Visit. I appreciate your ongoing commitment to your health goals. Please review the following plan we discussed and let me know if I can assist you in the future.   Screening recommendations/referrals: Colonoscopy: Done 02/17/17 Mammogram: Done 06/02/16 Bone Density: Will bring in paperwork of completeion Recommended yearly ophthalmology/optometry visit for glaucoma screening and checkup Recommended yearly dental visit for hygiene and checkup  Vaccinations: Influenza vaccine: Declined Pneumococcal vaccine: Up to date Tdap vaccine: Up to date Shingles vaccine: Shingrix discussed. Please contact your pharmacy for coverage information.    Covid-19 Please contact [facility] for vaccine information and schedule a date for vaccine  Advanced directives: Advance directive discussed with you today. I have provided a copy for you to complete at home and have notarized. Once this is complete please bring a copy in to our office so we can scan it into your chart.   Conditions/risks identified: Lose 10 lbs every 3 months for 1 year  Next appointment: Follow up in one year for your annual wellness visit    Preventive Care 77 Years and Older, Female Preventive care refers to lifestyle choices and visits with your health care provider that can promote health and wellness. What does preventive care include?  A yearly physical exam. This is also called an annual well check.  Dental exams once or twice a year.  Routine eye exams. Ask your health care provider how often you should have your eyes checked.  Personal lifestyle choices, including:  Daily care of your teeth and gums.  Regular physical activity.  Eating a healthy diet.  Avoiding tobacco and drug use.  Limiting alcohol use.  Practicing safe sex.  Taking low-dose aspirin every day.  Taking vitamin and mineral supplements as  recommended by your health care provider. What happens during an annual well check? The services and screenings done by your health care provider during your annual well check will depend on your age, overall health, lifestyle risk factors, and family history of disease. Counseling  Your health care provider may ask you questions about your:  Alcohol use.  Tobacco use.  Drug use.  Emotional well-being.  Home and relationship well-being.  Sexual activity.  Eating habits.  History of falls.  Memory and ability to understand (cognition).  Work and work Statistician.  Reproductive health. Screening  You may have the following tests or measurements:  Height, weight, and BMI.  Blood pressure.  Lipid and cholesterol levels. These may be checked every 5 years, or more frequently if you are over 77 years old.  Skin check.  Lung cancer screening. You may have this screening every year starting at age 77 if you have a 30-pack-year history of smoking and currently smoke or have quit within the past 15 years.  Fecal occult blood test (FOBT) of the stool. You may have this test every year starting at age 77.  Flexible sigmoidoscopy or colonoscopy. You may have a sigmoidoscopy every 5 years or a colonoscopy every 10 years starting at age 77.  Hepatitis C blood test.  Hepatitis B blood test.  Sexually transmitted disease (STD) testing.  Diabetes screening. This is done by checking your blood sugar (glucose) after you have not eaten for a while (fasting). You may have this done every 1-3 years.  Bone density scan. This is done to screen for osteoporosis. You may have this done starting at age 77.  Mammogram.  This may be done every 1-2 years. Talk to your health care provider about how often you should have regular mammograms. Talk with your health care provider about your test results, treatment options, and if necessary, the need for more tests. Vaccines  Your health care  provider may recommend certain vaccines, such as:  Influenza vaccine. This is recommended every year.  Tetanus, diphtheria, and acellular pertussis (Tdap, Td) vaccine. You may need a Td booster every 10 years.  Zoster vaccine. You may need this after age 77.  Pneumococcal 13-valent conjugate (PCV13) vaccine. One dose is recommended after age 77.  Pneumococcal polysaccharide (PPSV23) vaccine. One dose is recommended after age 77. Talk to your health care provider about which screenings and vaccines you need and how often you need them. This information is not intended to replace advice given to you by your health care provider. Make sure you discuss any questions you have with your health care provider. Document Released: 02/09/2015 Document Revised: 10/03/2015 Document Reviewed: 11/14/2014 Elsevier Interactive Patient Education  2017 [place] Prevention in the Home Falls can cause injuries. They can happen to people of all ages. There are many things you can do to make your home safe and to help prevent falls. What can I do on the outside of my home?  Regularly fix the edges of walkways and driveways and fix any cracks.  Remove anything that might make you trip as you walk through a door, such as a raised step or threshold.  Trim any bushes or trees on the path to your home.  Use bright outdoor lighting.  Clear any walking paths of anything that might make someone trip, such as rocks or tools.  Regularly check to see if handrails are loose or broken. Make sure that both sides of any steps have handrails.  Any raised decks and porches should have guardrails on the edges.  Have any leaves, snow, or ice cleared regularly.  Use sand or salt on walking paths during winter.  Clean up any spills in your garage right away. This includes oil or grease spills. What can I do in the bathroom?  Use night lights.  Install grab bars by the toilet and in the tub and shower. Do  not use towel bars as grab bars.  Use non-skid mats or decals in the tub or shower.  If you need to sit down in the shower, use a plastic, non-slip stool.  Keep the floor dry. Clean up any water that spills on the floor as soon as it happens.  Remove soap buildup in the tub or shower regularly.  Attach bath mats securely with double-sided non-slip rug tape.  Do not have throw rugs and other things on the floor that can make you trip. What can I do in the bedroom?  Use night lights.  Make sure that you have a light by your bed that is easy to reach.  Do not use any sheets or blankets that are too big for your bed. They should not hang down onto the floor.  Have a firm chair that has side arms. You can use this for support while you get dressed.  Do not have throw rugs and other things on the floor that can make you trip. What can I do in the kitchen?  Clean up any spills right away.  Avoid walking on wet floors.  Keep items that you use a lot in easy-to-reach places.  If you need to reach something  above you, use a strong step stool that has a grab bar.  Keep electrical cords out of the way.  Do not use floor polish or wax that makes floors slippery. If you must use wax, use non-skid floor wax.  Do not have throw rugs and other things on the floor that can make you trip. What can I do with my stairs?  Do not leave any items on the stairs.  Make sure that there are handrails on both sides of the stairs and use them. Fix handrails that are broken or loose. Make sure that handrails are as long as the stairways.  Check any carpeting to make sure that it is firmly attached to the stairs. Fix any carpet that is loose or worn.  Avoid having throw rugs at the top or bottom of the stairs. If you do have throw rugs, attach them to the floor with carpet tape.  Make sure that you have a light switch at the top of the stairs and the bottom of the stairs. If you do not have them,  ask someone to add them for you. What else can I do to help prevent falls?  Wear shoes that:  Do not have high heels.  Have rubber bottoms.  Are comfortable and fit you well.  Are closed at the toe. Do not wear sandals.  If you use a stepladder:  Make sure that it is fully opened. Do not climb a closed stepladder.  Make sure that both sides of the stepladder are locked into place.  Ask someone to hold it for you, if possible.  Clearly mark and make sure that you can see:  Any grab bars or handrails.  First and last steps.  Where the edge of each step is.  Use tools that help you move around (mobility aids) if they are needed. These include:  Canes.  Walkers.  Scooters.  Crutches.  Turn on the lights when you go into a dark area. Replace any light bulbs as soon as they burn out.  Set up your furniture so you have a clear path. Avoid moving your furniture around.  If any of your floors are uneven, fix them.  If there are any pets around you, be aware of where they are.  Review your medicines with your doctor. Some medicines can make you feel dizzy. This can increase your chance of falling. Ask your doctor what other things that you can do to help prevent falls. This information is not intended to replace advice given to you by your health care provider. Make sure you discuss any questions you have with your health care provider. Document Released: 11/09/2008 Document Revised: 06/21/2015 Document Reviewed: 02/17/2014 Elsevier Interactive Patient Education  2017 [employer].

## 2020-03-07 NOTE — Progress Notes (Signed)
Virtual Visit via Telephone Note  I connected with  HALI BALGOBIN on 03/07/20 at  1:45 PM EST by telephone and verified that I am speaking with the correct person using two identifiers.  Location: Patient: Home Provider: Office Persons participating in the virtual visit: patient/Nurse Health Advisor   I discussed the limitations, risks, security and privacy concerns of performing an evaluation and management service by telephone and the availability of in person appointments. The patient expressed understanding and agreed to proceed.  Interactive audio and video telecommunications were attempted between this nurse and patient, however failed, due to patient having technical difficulties OR patient did not have access to video capability.  We continued and completed visit with audio only.  Some vital signs may be absent or patient reported.   Willette Brace, LPN    Subjective:   JAYCELYNN KNICKERBOCKER is a 75 y.o. female who presents for Medicare Annual (Subsequent) preventive examination.  Review of Systems     Cardiac Risk Factors include: advanced age (>18men, >82 women);diabetes mellitus;hypertension;dyslipidemia;obesity (BMI >30kg/m2)     Objective:    There were no vitals filed for this visit. There is no height or weight on file to calculate BMI.  Advanced Directives 03/07/2020 06/29/2018 12/30/2017 12/30/2017 02/17/2017 02/15/2017 04/20/2015  Does Patient Have a Medical Advance Directive? No Yes No No Yes Yes No  Type of Advance Directive - [place] will Living will -  Does patient want to make changes to medical advance directive? - No - Patient declined - - No - Patient declined No - Patient declined -  Copy of [place] in Chart? - No - copy requested - - - - -  Would patient like information on creating a medical advance directive? Yes (MAU/Ambulatory/Procedural Areas - Information given) No - Patient declined No - Patient declined -  No - Patient declined No - Patient declined No - patient declined information    Current Medications (verified) Outpatient Encounter Medications as of 03/07/2020  Medication Sig  . amLODipine (NORVASC) 5 MG tablet TAKE 1 TABLET BY MOUTH EVERY DAY  . chlorthalidone (HYGROTON) 25 MG tablet TAKE 1 TABLET BY MOUTH EVERY DAY  . cholecalciferol (VITAMIN D) 1000 UNITS tablet Take 1,000 Units by mouth daily.  Marland Kitchen glucose blood (ACCU-CHEK AVIVA PLUS) test strip Use to check blood sugar daily E11.9  . Lancets (ACCU-CHEK SOFT TOUCH) lancets Use to test blood glucose twice daily  . levothyroxine (SYNTHROID) 75 MCG tablet TAKE 1 TABLET (75 MCG TOTAL) BY MOUTH DAILY BEFORE BREAKFAST.  Marland Kitchen losartan (COZAAR) 100 MG tablet TAKE 1 TABLET (100 MG TOTAL) BY MOUTH DAILY. MAY BEGIN AT 1/2 TABLET DAILY AND INCREASE TO 1 TABLET  . metoprolol succinate (TOPROL-XL) 25 MG 24 hr tablet Take 1 tablet (25 mg total) by mouth daily.  . Misc Natural Products (OSTEO BI-FLEX ADV JOINT SHIELD PO) Take 1 tablet by mouth daily.  . Multiple Vitamins-Minerals (CENTRUM SILVER PO) Take 1 tablet by mouth daily.  . potassium chloride (KLOR-CON) 10 MEQ tablet TAKE 1 TABLET (10 MEQ TOTAL) BY MOUTH 3 (THREE) TIMES DAILY.   No facility-administered encounter medications on file as of 03/07/2020.    Allergies (verified) Patient has no known allergies.   History: Past Medical History:  Diagnosis Date  . Acute bronchospasm 08/24/2009  . Acute lower GI bleeding 02/16/2017  . Acute perforated appendicitis w abscess s/p lap appendectomy 12/30/2017 12/30/2017  . Acute sphenoidal sinusitis 02/26/2010  Qualifier: Diagnosis of  By: Regis Bill MD, Standley Brooking   . CARDIAC MURMUR 12/08/2006  . COLONIC POLYPS, HX OF 12/08/2006  . DIABETES MELLITUS, TYPE II 12/08/2006  . Gallbladder/common duct stone, without infection, with obstruction 03/01/2010   removed ercp  . HYPERLIPIDEMIA 12/08/2006  . HYPERTENSION 12/08/2006  . 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  INFECTION, SKIN AND SOFT TISSUE 07/28/2008  . 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 LIVER FUNCTION TESTS, ABNORMAL 07/28/2008  . Morbid obesity ([place]) 12/08/2006  . OBESITY 09/24/2009  . OBSTRUCTIVE SLEEP APNEA 12/08/2006  . OSTEOARTHRITIS 12/08/2006  . RUQ PAIN 06/16/2008  . SHOULDER PAIN, RIGHT 02/07/2008  . Sleep apnea   . Swelling of limb 07/28/2008  . THYROID FUNCTION TEST, ABNORMAL 12/08/2006   Past Surgical History:  Procedure Laterality Date  . ABDOMINAL HYSTERECTOMY    . CHOLECYSTECTOMY  06/27/2008  . COLONOSCOPY N/A 12/13/2012   Procedure: COLONOSCOPY;  Surgeon: Juanita Craver, MD;  Location: [facility];  Service: Endoscopy;  Laterality: N/A;  . COLONOSCOPY Left 02/17/2017   Procedure: COLONOSCOPY;  Surgeon: Carol Ada, MD;  Location: [facility];  Service: Endoscopy;  Laterality: Left;  . common duct stone     ERCP   . LAPAROSCOPIC APPENDECTOMY N/A 12/30/2017   Procedure: APPENDECTOMY LAPAROSCOPIC;  Surgeon: Stark Klein, MD;  Location: [facility];  Service: General;  Laterality: N/A;   Family History  Problem Relation Age of Onset  . Other Mother        blood clots  . Cervical cancer Mother   . Cancer Mother   . Heart disease Brother   . Diabetes Brother   . Heart disease Sister   . Diabetes Sister   . Heart disease Brother   . Diabetes Brother   . Heart disease Brother   . Diabetes Brother   . Stroke Sister   . Diabetes Sister   . Throat cancer Father   . Cancer Father   . Diabetes Other        all siblings, 4 brothers, 5 sisters   Social History   Socioeconomic History  . Marital status: Divorced    Spouse name: Not on file  . Number of children: Not on file  . Years of education: Not on file  . Highest education level: Not on file  Occupational History  . Occupation: retired    Fish farm manager: RETIRED  Tobacco Use  . Smoking status: Never Smoker  . Smokeless tobacco: Never Used  Vaping Use  . Vaping Use: Never used  Substance and Sexual Activity  . Alcohol use: No     Alcohol/week: 0.0 standard drinks  . Drug use: No  . Sexual activity: Not on file  Other Topics Concern  . Not on file  Social History Narrative   Master level education in math   Pt is currently retired   Pt is divorced with children   Recently had to move had a break in and thus away from her pool exercise   Social Determinants of Health   Financial Resource Strain: Low Risk   . Difficulty of Paying Living Expenses: Not hard at all  Food Insecurity: No Food Insecurity  . Worried About Charity fundraiser in the Last Year: Never true  . Ran Out of Food in the Last Year: Never true  Transportation Needs: No Transportation Needs  . Lack of Transportation (Medical): No  . Lack of Transportation (Non-Medical): No  Physical Activity: Inactive  . Days of Exercise per Week: 0 days  . Minutes  of Exercise per Session: 0 min  Stress: No Stress Concern Present  . Feeling of Stress : Not at all  Social Connections: Moderately Integrated  . Frequency of Communication with Friends and Family: More than three times a week  . Frequency of Social Gatherings with Friends and Family: Once a week  . Attends Religious Services: More than 4 times per year  . Active Member of Clubs or Organizations: Yes  . Attends Archivist Meetings: 1 to 4 times per year  . Marital Status: Divorced    Tobacco Counseling Counseling given: Not Answered   Clinical Intake:  Pre-visit preparation completed: Yes  Pain : No/denies pain     BMI - recorded: 49.34 Nutritional Status: BMI > 30  Obese Nutritional Risks: None Diabetes: Yes CBG done?: No Did pt. bring in CBG monitor from home?: No  How often do you need to have someone help you when you read instructions, pamphlets, or other written materials from your doctor or pharmacy?: 1 - Never  Diabetic?Nutrition Risk Assessment:  Has the patient had any N/V/D within the last 2 months?  No  Does the patient have any non-healing wounds?  No   Has the patient had any unintentional weight loss or weight gain?  No   Diabetes:  Is the patient diabetic?  Yes  If diabetic, was a CBG obtained today?  No  Did the patient bring in their glucometer from home?  No  How often do you monitor your CBG's? N/A.   Financial Strains and Diabetes Management:  Are you having any financial strains with the device, your supplies or your medication? No .  Does the patient want to be seen by Chronic Care Management for management of their diabetes?  No  Would the patient like to be referred to a Nutritionist or for Diabetic Management?  No   Diabetic Exams:  Diabetic Eye Exam: Completed 08/02/19 Diabetic Foot Exam: Completed 04/01/19   Interpreter Needed?: No  Information entered by :: Charlott Rakes, LPN   Activities of Daily Living In your present state of health, do you have any difficulty performing the following activities: 03/07/2020  Hearing? N  Vision? N  Difficulty concentrating or making decisions? N  Walking or climbing stairs? Y  Comment very slowly  Dressing or bathing? N  Doing errands, shopping? N  Preparing Food and eating ? N  Using the Toilet? N  In the past six months, have you accidently leaked urine? N  Do you have problems with loss of bowel control? N  Managing your Medications? N  Managing your Finances? N  Housekeeping or managing your Housekeeping? N  Some recent data might be hidden    Patient Care Team: Panosh, Standley Brooking, MD as PCP - General Fay Records, MD as PCP - Cardiology (Cardiology) Alphonsa Overall, MD (General Surgery) Carol Ada, MD (Gastroenterology) Gean Birchwood, DPM (Podiatry) Adrian Prows, MD as Consulting Physician (Cardiology) Juanita Craver, MD as Consulting Physician (Gastroenterology) Zadie Rhine Clent Demark, MD as Consulting Physician (Ophthalmology)  Indicate any recent Medical Services you may have received from other than [facility] providers in the past year (date may be approximate).      Assessment:   This is a routine wellness examination for Karys.  Hearing/Vision screen  Hearing Screening   125Hz  250Hz  500Hz  1000Hz  2000Hz  3000Hz  4000Hz  6000Hz  8000Hz   Right ear:           Left ear:  Comments: Pt denies any hearing   Vision Screening Comments: Pt follows up with [facility] for annual eye exams   Dietary issues and exercise activities discussed: Current Exercise Habits: The patient does not participate in regular exercise at present  Goals    . Patient Stated     Lose 10 lbs every 3 months this years      Depression Screen PHQ 2/9 Scores 03/07/2020 12/05/2019 04/05/2018 02/24/2018 11/04/2016 04/20/2015 01/08/2015  PHQ - 2 Score 0 0 0 0 0 0 0    Fall Risk Fall Risk  03/07/2020 12/05/2019 04/05/2018 02/24/2018 08/15/2016  Falls in the past year? 1 0 0 0 No  Comment - - - - Emmi Telephone Survey: data to providers prior to load  Number falls in past yr: 1 0 0 0 -  Injury with Fall? 1 0 0 0 -  Comment bruises - - - -  Risk for fall due to : Impaired vision;Impaired balance/gait;History of fall(s) - - - -  Follow up Falls prevention discussed - Falls evaluation completed - -    FALL RISK PREVENTION PERTAINING TO THE HOME:  Any stairs in or around the home? No  If so, are there any without handrails? No  Home free of loose throw rugs in walkways, pet beds, electrical cords, etc? Yes  Adequate lighting in your home to reduce risk of falls? Yes   ASSISTIVE DEVICES UTILIZED TO PREVENT FALLS:  Life alert? Yes  Use of a cane, walker or w/c? No  Grab bars in the bathroom? No  Shower chair or bench in shower? Yes  Elevated toilet seat or a handicapped toilet? Yes  TIMED UP AND GO:  Was the test performed? No .     Cognitive Function:     6CIT Screen 03/07/2020  What Year? 0 points  What month? 0 points  Count back from 20 0 points  Months in reverse 0 points  Repeat phrase 0 points    Immunizations Immunization History  Administered Date(s)  Administered  . Pneumococcal Conjugate-13 11/23/2015  . Pneumococcal Polysaccharide-23 05/24/2012  . Td 01/28/2003  . Tdap 12/05/2019    TDAP status: Up to date  Flu Vaccine status: Declined, Education has been provided regarding the importance of this vaccine but patient still declined. Advised may receive this vaccine at local pharmacy or Health Dept. Aware to provide a copy of the vaccination record if obtained from local pharmacy or Health Dept. Verbalized acceptance and understanding.  Pneumococcal vaccine status: Up to date  Covid-19 vaccine status: Information provided on how to obtain vaccines.   Qualifies for Shingles Vaccine? Yes   Zostavax completed No   Shingrix Completed?: No.    Education has been provided regarding the importance of this vaccine. Patient has been advised to call insurance company to determine out of pocket expense if they have not yet received this vaccine. Advised may also receive vaccine at local pharmacy or Health Dept. Verbalized acceptance and understanding.  Screening Tests Health Maintenance  Topic Date Due  . DEXA SCAN  Never done  . COVID-19 Vaccine (1) 03/23/2020 (Originally 04/12/1950)  . FOOT EXAM  03/31/2020  . HEMOGLOBIN A1C  06/03/2020  . OPHTHALMOLOGY EXAM  08/01/2020  . TETANUS/TDAP  12/04/2029  . Hepatitis C Screening  Completed  . PNA vac Low Risk Adult  Completed  . INFLUENZA VACCINE  Discontinued    Health Maintenance  Health Maintenance Due  Topic Date Due  . DEXA SCAN  Never done  Colorectal cancer screening: Type of screening: Colonoscopy. Completed 01/28/17. Repeat every ? years  Mammogram status: Completed 06/02/16. Repeat every year  Bone density: Pt stated she will bring paperwork  Additional Screening:  Hepatitis C Screening:  Completed 02/26/10  Vision Screening: Recommended annual ophthalmology exams for early detection of glaucoma and other disorders of the eye. Is the patient up to date with their annual  eye exam?  Yes  Who is the provider or what is the name of the office in which the patient attends annual eye exams? Dr Zadie Rhine    Dental Screening: Recommended annual dental exams for proper oral hygiene  Community Resource Referral / Chronic Care Management: CRR required this visit?  No   CCM required this visit?  No      Plan:     I have personally reviewed and noted the following in the patient's chart:   . Medical and social history . Use of alcohol, tobacco or illicit drugs  . Current medications and supplements . Functional ability and status . Nutritional status . Physical activity . Advanced directives . List of other physicians . Hospitalizations, surgeries, and ER visits in previous 12 months . Vitals . Screenings to include cognitive, depression, and falls . Referrals and appointments  In addition, I have reviewed and discussed with patient certain preventive protocols, quality metrics, and best practice recommendations. A written personalized care plan for preventive services as well as general preventive health recommendations were provided to patient.     Willette Brace, LPN   04/03/9022   Nurse Notes: None

## 2020-03-09 ENCOUNTER — Other Ambulatory Visit: Payer: Self-pay | Admitting: Internal Medicine

## 2020-03-26 ENCOUNTER — Ambulatory Visit (INDEPENDENT_AMBULATORY_CARE_PROVIDER_SITE_OTHER): Payer: Medicare HMO | Admitting: Ophthalmology

## 2020-03-26 ENCOUNTER — Other Ambulatory Visit: Payer: Self-pay

## 2020-03-26 ENCOUNTER — Encounter (INDEPENDENT_AMBULATORY_CARE_PROVIDER_SITE_OTHER): Payer: Self-pay | Admitting: Ophthalmology

## 2020-03-26 DIAGNOSIS — E113293 Type 2 diabetes mellitus with mild nonproliferative diabetic retinopathy without macular edema, bilateral: Secondary | ICD-10-CM | POA: Insufficient documentation

## 2020-03-26 DIAGNOSIS — H43811 Vitreous degeneration, right eye: Secondary | ICD-10-CM | POA: Diagnosis not present

## 2020-03-26 DIAGNOSIS — H2513 Age-related nuclear cataract, bilateral: Secondary | ICD-10-CM

## 2020-03-26 DIAGNOSIS — H43812 Vitreous degeneration, left eye: Secondary | ICD-10-CM

## 2020-03-26 HISTORY — DX: Age-related nuclear cataract, bilateral: H25.13

## 2020-03-26 NOTE — Assessment & Plan Note (Signed)
The nature of mild nonproliferative diabetic retinopathy was discussed with the patient. Emphasis was placed on tight glucose, blood pressure, and serum lipid control. Avoidance of smoking was emphasized. Maintenance of normal body weight was emphasized. Appropriate follow up dilated exam is 1 year. 

## 2020-03-26 NOTE — Progress Notes (Signed)
03/26/2020     CHIEF COMPLAINT Patient presents for Retina Follow Up (3 Year Diabetic F/U OU//Pt c/o decreased near [place] OU. Pt denies flashes or floaters. DVA stable OU.//A1c: 6.3, 11/2019/LBS: 111 this AM)   HISTORY OF PRESENT ILLNESS: Rebecca Rich is a 75 y.o. female who presents to the clinic today for:   HPI    Retina Follow Up    Patient presents with  Diabetic Retinopathy.  In both eyes.  This started 3 years ago.  Severity is mild.  Duration of 3 years.  Since onset it is gradually worsening. Additional comments: 3 Year Diabetic F/U OU  Pt c/o decreased near [place]. Pt denies flashes or floaters. DVA stable OU.  A1c: 6.3, 11/2019 LBS: 111 this AM       Last edited by Rockie Neighbours, [place] on 03/26/2020  9:16 AM. (History)      Referring physician: Burnis Medin, MD [place],  [place] [id number]  HISTORICAL INFORMATION:   Selected notes from the MEDICAL RECORD NUMBER    Lab Results  Component Value Date   HGBA1C 6.7 (A) 12/05/2019   HGBA1C 6.7 12/05/2019     CURRENT MEDICATIONS: No current outpatient medications on file. (Ophthalmic Drugs)   No current facility-administered medications for this visit. (Ophthalmic Drugs)   Current Outpatient Medications (Other)  Medication Sig  . amLODipine (NORVASC) 5 MG tablet TAKE 1 TABLET BY MOUTH EVERY DAY  . chlorthalidone (HYGROTON) 25 MG tablet TAKE 1 TABLET BY MOUTH EVERY DAY  . cholecalciferol (VITAMIN D) 1000 UNITS tablet Take 1,000 Units by mouth daily.  Marland Kitchen glucose blood (ACCU-CHEK AVIVA PLUS) test strip Use to check blood sugar daily E11.9  . Lancets (ACCU-CHEK SOFT TOUCH) lancets Use to test blood glucose twice daily  . levothyroxine (SYNTHROID) 75 MCG tablet TAKE 1 TABLET (75 MCG TOTAL) BY MOUTH DAILY BEFORE BREAKFAST.  Marland Kitchen losartan (COZAAR) 100 MG tablet TAKE 1 TABLET (100 MG TOTAL) BY MOUTH DAILY. MAY BEGIN AT 1/2 TABLET DAILY AND INCREASE TO 1 TABLET  . metoprolol succinate (TOPROL-XL) 25 MG  24 hr tablet Take 1 tablet (25 mg total) by mouth daily.  . Misc Natural Products (OSTEO BI-FLEX ADV JOINT SHIELD PO) Take 1 tablet by mouth daily.  . Multiple Vitamins-Minerals (CENTRUM SILVER PO) Take 1 tablet by mouth daily.  . potassium chloride (KLOR-CON) 10 MEQ tablet TAKE 1 TABLET (10 MEQ TOTAL) BY MOUTH 3 (THREE) TIMES DAILY.   No current facility-administered medications for this visit. (Other)      REVIEW OF SYSTEMS:    ALLERGIES No Known Allergies  PAST MEDICAL HISTORY Past Medical History:  Diagnosis Date  . Acute bronchospasm 08/24/2009  . Acute lower GI bleeding 02/16/2017  . Acute perforated appendicitis w abscess s/p lap appendectomy 12/30/2017 12/30/2017  . Acute sphenoidal sinusitis 02/26/2010   Qualifier: Diagnosis of  By: Regis Bill MD, Standley Brooking   . CARDIAC MURMUR 12/08/2006  . COLONIC POLYPS, HX OF 12/08/2006  . DIABETES MELLITUS, TYPE II 12/08/2006  . Gallbladder/common duct stone, without infection, with obstruction 03/01/2010   removed ercp  . HYPERLIPIDEMIA 12/08/2006  . HYPERTENSION 12/08/2006  . 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 INFECTION, SKIN AND SOFT TISSUE 07/28/2008  . 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 LIVER FUNCTION TESTS, ABNORMAL 07/28/2008  . Morbid obesity ([place]) 12/08/2006  . OBESITY 09/24/2009  . OBSTRUCTIVE SLEEP APNEA 12/08/2006  . OSTEOARTHRITIS 12/08/2006  . RUQ PAIN 06/16/2008  . SHOULDER PAIN, RIGHT 02/07/2008  . Sleep apnea   .  Swelling of limb 07/28/2008  . THYROID FUNCTION TEST, ABNORMAL 12/08/2006   Past Surgical History:  Procedure Laterality Date  . ABDOMINAL HYSTERECTOMY    . CHOLECYSTECTOMY  06/27/2008  . COLONOSCOPY N/A 12/13/2012   Procedure: COLONOSCOPY;  Surgeon: Juanita Craver, MD;  Location: [facility];  Service: Endoscopy;  Laterality: N/A;  . COLONOSCOPY Left 02/17/2017   Procedure: COLONOSCOPY;  Surgeon: Carol Ada, MD;  Location: [place];  Service: Endoscopy;  Laterality: Left;  . common duct stone     ERCP   . LAPAROSCOPIC  APPENDECTOMY N/A 12/30/2017   Procedure: APPENDECTOMY LAPAROSCOPIC;  Surgeon: Stark Klein, MD;  Location: [facility];  Service: General;  Laterality: N/A;    FAMILY HISTORY Family History  Problem Relation Age of Onset  . Other Mother        blood clots  . Cervical cancer Mother   . Cancer Mother   . Heart disease Brother   . Diabetes Brother   . Heart disease Sister   . Diabetes Sister   . Heart disease Brother   . Diabetes Brother   . Heart disease Brother   . Diabetes Brother   . Stroke Sister   . Diabetes Sister   . Throat cancer Father   . Cancer Father   . Diabetes Other        all siblings, 4 brothers, 5 sisters    SOCIAL HISTORY Social History   Tobacco Use  . Smoking status: Never Smoker  . Smokeless tobacco: Never Used  Vaping Use  . Vaping Use: Never used  Substance Use Topics  . Alcohol use: No    Alcohol/week: 0.0 standard drinks  . Drug use: No         OPHTHALMIC EXAM:  Base Eye Exam    Visual Acuity (ETDRS)      Right Left   Dist cc 20/30 -2 20/40 +2   Dist ph cc 20/25 20/25 -1   Correction: Glasses       Tonometry (Tonopen, 9:16 AM)      Right Left   Pressure 15 13       Pupils      Pupils Dark Light Shape React APD   Right PERRL 5 4 Round Brisk None   Left PERRL 5 4 Round Brisk None       Visual Fields (Counting fingers)      Left Right    Full Full       Extraocular Movement      Right Left    Full Full       Neuro/Psych    Oriented x3: Yes   Mood/Affect: Normal       Dilation    Both eyes: 1.0% Mydriacyl, 2.5% Phenylephrine @ 9:21 AM        Slit Lamp and Fundus Exam    External Exam      Right Left   External Normal Normal       Slit Lamp Exam      Right Left   Lids/Lashes Normal Normal   Conjunctiva/Sclera White and quiet White and quiet   Cornea Clear Clear   Anterior Chamber Deep and quiet Deep and quiet   Iris Round and reactive Round and reactive   Lens 2+ Nuclear sclerosis 2+ Nuclear sclerosis    Anterior Vitreous Normal Normal       Fundus Exam      Right Left   Posterior Vitreous Posterior vitreous detachment Normal   Disc Normal Normal  C/D Ratio 0.1 0.1   Macula Normal, no macular thickening, no clinically significant macular edema Normal, no macular thickening, no clinically significant macular edema   Vessels NPDR- Mild NPDR- Mild   Periphery Normal Normal          IMAGING AND PROCEDURES  Imaging and Procedures for 03/26/20  OCT, Retina - OU - Both Eyes       Right Eye Quality was good. Central Foveal Thickness: 256. Progression has been stable. Findings include normal foveal contour.   Left Eye Quality was good. Scan locations included subfoveal. Central Foveal Thickness: 251. Progression has been stable. Findings include normal foveal contour.   Notes Incidental posterior vitreous detachment OU, media clear.  No active maculopathy.                ASSESSMENT/PLAN:  Nuclear sclerotic cataract of both eyes The nature of cataract was discussed with the patient as well as the elective nature of surgery. The patient was reassured that surgery at a later date does not put the patient at risk for a worse outcome. It was emphasized that the need for surgery is dictated by the patient's quality of life as influenced by the cataract. Patient was instructed to maintain close follow up with their general eye care doctor.  Nonproliferative diabetic retinopathy of both eyes ([place]) The nature of mild nonproliferative diabetic retinopathy was discussed with the patient. Emphasis was placed on tight glucose, blood pressure, and serum lipid control. Avoidance of smoking was emphasized. Maintenance of normal body weight was emphasized. Appropriate follow up dilated exam is 1 year.  Posterior vitreous detachment of right eye   The nature of posterior vitreous detachment was discussed with the patient as well as its physiology, its age prevalence, and its possible  implication regarding retinal breaks and detachment.  An informational brochure was given to the patient.  All the patient's questions were answered.  The patient was asked to return if new or different flashes or floaters develops.   Patient was instructed to contact office immediately if any changes were noticed. I explained to the patient that vitreous inside the eye is similar to jello inside a bowl. As the jello melts it can start to pull away from the bowl, similarly the vitreous throughout our lives can begin to pull away from the retina. That process is called a posterior vitreous detachment. In some cases, the vitreous can tug hard enough on the retina to form a retinal tear. I discussed with the patient the signs and symptoms of a retinal detachment.  Do not rub the eye.      ICD-10-CM   1. Mild nonproliferative diabetic retinopathy of both eyes without macular edema associated with type 2 diabetes mellitus ([place])  G64.4034   2. Posterior vitreous detachment of right eye  H43.811 OCT, Retina - OU - Both Eyes  3. Posterior vitreous detachment of left eye  H43.812 OCT, Retina - OU - Both Eyes  4. Nuclear sclerotic cataract of both eyes  H25.13     1.  New change from no diabetic detectable retinopathy now with mild nonproliferative diabetic retinopathy, no macular edema OU  2.  Obtain your blood sugar control discussed  3.  Mild to moderate nuclear sclerotic cataracts in each eye observe  Ophthalmic Meds Ordered this visit:  No orders of the defined types were placed in this encounter.      Return in about 6 months (around 09/23/2020) for DILATE OU, COLOR FP.  There are  no Patient Instructions on file for this visit.   Explained the diagnoses, plan, and follow up with the patient and they expressed understanding.  Patient expressed understanding of the importance of proper follow up care.   Clent Demark Deondrea Markos M.D. Diseases & Surgery of the Retina and Vitreous [facility] [place] 03/26/20     Abbreviations: M myopia (nearsighted); A astigmatism; H hyperopia (farsighted); P presbyopia; Mrx spectacle prescription;  CTL contact lenses; OD right eye; OS left eye; OU both eyes  XT exotropia; ET esotropia; PEK punctate epithelial keratitis; PEE punctate epithelial erosions; DES dry eye syndrome; MGD meibomian gland dysfunction; ATs artificial tears; PFAT's preservative free artificial tears; [place] nuclear sclerotic cataract; PSC posterior subcapsular cataract; ERM epi-retinal membrane; PVD posterior vitreous detachment; RD retinal detachment; DM diabetes mellitus; DR diabetic retinopathy; NPDR non-proliferative diabetic retinopathy; PDR proliferative diabetic retinopathy; CSME clinically significant macular edema; DME diabetic macular edema; dbh dot blot hemorrhages; CWS cotton wool spot; POAG primary open angle glaucoma; C/D cup-to-disc ratio; HVF humphrey visual field; GVF goldmann visual field; OCT optical coherence tomography; IOP intraocular pressure; BRVO Branch retinal vein occlusion; CRVO central retinal vein occlusion; CRAO central retinal artery occlusion; BRAO branch retinal artery occlusion; RT retinal tear; SB scleral buckle; PPV pars plana vitrectomy; VH Vitreous hemorrhage; PRP panretinal laser photocoagulation; IVK intravitreal kenalog; VMT vitreomacular traction; MH Macular hole;  NVD neovascularization of the disc; NVE neovascularization elsewhere; AREDS age related eye disease study; ARMD age related macular degeneration; POAG primary open angle glaucoma; EBMD epithelial/anterior basement membrane dystrophy; ACIOL anterior chamber intraocular lens; IOL intraocular lens; PCIOL posterior chamber intraocular lens; Phaco/IOL phacoemulsification with intraocular lens placement; Kerr photorefractive keratectomy; LASIK laser assisted in situ keratomileusis; HTN hypertension; DM diabetes mellitus; COPD chronic obstructive pulmonary disease

## 2020-03-26 NOTE — Assessment & Plan Note (Signed)

## 2020-03-26 NOTE — Assessment & Plan Note (Signed)

## 2020-03-28 ENCOUNTER — Other Ambulatory Visit: Payer: Self-pay | Admitting: Internal Medicine

## 2020-04-03 ENCOUNTER — Other Ambulatory Visit: Payer: Self-pay | Admitting: Internal Medicine

## 2020-04-03 ENCOUNTER — Other Ambulatory Visit: Payer: Self-pay

## 2020-04-03 ENCOUNTER — Other Ambulatory Visit (INDEPENDENT_AMBULATORY_CARE_PROVIDER_SITE_OTHER): Payer: Medicare HMO

## 2020-04-03 DIAGNOSIS — Z79899 Other long term (current) drug therapy: Secondary | ICD-10-CM

## 2020-04-03 DIAGNOSIS — E1169 Type 2 diabetes mellitus with other specified complication: Secondary | ICD-10-CM | POA: Diagnosis not present

## 2020-04-03 DIAGNOSIS — E039 Hypothyroidism, unspecified: Secondary | ICD-10-CM | POA: Diagnosis not present

## 2020-04-03 DIAGNOSIS — E785 Hyperlipidemia, unspecified: Secondary | ICD-10-CM

## 2020-04-03 DIAGNOSIS — I10 Essential (primary) hypertension: Secondary | ICD-10-CM | POA: Diagnosis not present

## 2020-04-03 DIAGNOSIS — E669 Obesity, unspecified: Secondary | ICD-10-CM

## 2020-04-03 LAB — CBC WITH DIFFERENTIAL/PLATELET
Basophils Absolute: 0 10*3/uL (ref 0.0–0.1)
Basophils Relative: 0.4 % (ref 0.0–3.0)
Eosinophils Absolute: 0.1 10*3/uL (ref 0.0–0.7)
Eosinophils Relative: 2.9 % (ref 0.0–5.0)
HCT: 35.5 % — ABNORMAL LOW (ref 36.0–46.0)
Hemoglobin: 12.1 g/dL (ref 12.0–15.0)
Lymphocytes Relative: 43.4 % (ref 12.0–46.0)
Lymphs Abs: 2.1 10*3/uL (ref 0.7–4.0)
MCHC: 34.2 g/dL (ref 30.0–36.0)
MCV: 87.9 fl (ref 78.0–100.0)
Monocytes Absolute: 0.4 10*3/uL (ref 0.1–1.0)
Monocytes Relative: 8.1 % (ref 3.0–12.0)
Neutro Abs: 2.2 10*3/uL (ref 1.4–7.7)
Neutrophils Relative %: 45.2 % (ref 43.0–77.0)
Platelets: 168 10*3/uL (ref 150.0–400.0)
RBC: 4.04 Mil/uL (ref 3.87–5.11)
RDW: 13.6 % (ref 11.5–15.5)
WBC: 4.9 10*3/uL (ref 4.0–10.5)

## 2020-04-03 LAB — MICROALBUMIN / CREATININE URINE RATIO
Creatinine,U: 88.6 mg/dL
Microalb Creat Ratio: 1.5 mg/g (ref 0.0–30.0)
Microalb, Ur: 1.3 mg/dL (ref 0.0–1.9)

## 2020-04-03 LAB — BASIC METABOLIC PANEL
BUN: 22 mg/dL (ref 6–23)
CO2: 26 mEq/L (ref 19–32)
Calcium: 9.4 mg/dL (ref 8.4–10.5)
Chloride: 106 mEq/L (ref 96–112)
Creatinine, Ser: 1.16 mg/dL (ref 0.40–1.20)
GFR: 45.65 mL/min — ABNORMAL LOW (ref >60.00)
Glucose, Bld: 135 mg/dL — ABNORMAL HIGH (ref 70–99)
Potassium: 4 mEq/L (ref 3.5–5.1)
Sodium: 141 mEq/L (ref 135–145)

## 2020-04-03 LAB — LIPID PANEL
Cholesterol: 136 mg/dL (ref 0–200)
HDL: 51.5 mg/dL (ref >39.00)
LDL Cholesterol: 62 mg/dL (ref 0–99)
NonHDL: 84.51
Total CHOL/HDL Ratio: 3
Triglycerides: 111 mg/dL (ref 0.0–149.0)
VLDL: 22.2 mg/dL (ref 0.0–40.0)

## 2020-04-03 LAB — HEPATIC FUNCTION PANEL
ALT: 11 U/L (ref 0–35)
AST: 11 U/L (ref 0–37)
Albumin: 3.8 g/dL (ref 3.5–5.2)
Alkaline Phosphatase: 71 U/L (ref 39–117)
Bilirubin, Direct: 0.1 mg/dL (ref 0.0–0.3)
Total Bilirubin: 0.4 mg/dL (ref 0.2–1.2)
Total Protein: 6.6 g/dL (ref 6.0–8.3)

## 2020-04-03 LAB — HEMOGLOBIN A1C: Hgb A1c MFr Bld: 7 % — ABNORMAL HIGH (ref 4.6–6.5)

## 2020-04-03 LAB — TSH: TSH: 4.07 u[IU]/mL (ref 0.35–4.50)

## 2020-04-03 NOTE — Progress Notes (Signed)
Here for labs     Placed

## 2020-04-09 NOTE — Progress Notes (Signed)
Blood results stable although globin A1c is up to 7  will review at upcoming office visit.

## 2020-04-10 ENCOUNTER — Ambulatory Visit (INDEPENDENT_AMBULATORY_CARE_PROVIDER_SITE_OTHER): Payer: Medicare HMO | Admitting: Internal Medicine

## 2020-04-10 ENCOUNTER — Other Ambulatory Visit: Payer: Self-pay

## 2020-04-10 ENCOUNTER — Encounter: Payer: Self-pay | Admitting: Internal Medicine

## 2020-04-10 VITALS — BP 142/86 | HR 72 | Temp 98.7°F | Ht 61.0 in | Wt 259.8 lb

## 2020-04-10 DIAGNOSIS — E1169 Type 2 diabetes mellitus with other specified complication: Secondary | ICD-10-CM | POA: Diagnosis not present

## 2020-04-10 DIAGNOSIS — E669 Obesity, unspecified: Secondary | ICD-10-CM

## 2020-04-10 DIAGNOSIS — Z79899 Other long term (current) drug therapy: Secondary | ICD-10-CM | POA: Diagnosis not present

## 2020-04-10 DIAGNOSIS — I10 Essential (primary) hypertension: Secondary | ICD-10-CM | POA: Diagnosis not present

## 2020-04-10 DIAGNOSIS — G4733 Obstructive sleep apnea (adult) (pediatric): Secondary | ICD-10-CM

## 2020-04-10 DIAGNOSIS — Z6841 Body Mass Index (BMI) 40.0 and over, adult: Secondary | ICD-10-CM

## 2020-04-10 DIAGNOSIS — Z9989 Dependence on other enabling machines and devices: Secondary | ICD-10-CM

## 2020-04-10 MED ORDER — AMLODIPINE BESYLATE 2.5 MG PO TABS: 2.5000 mg | ORAL_TABLET | Freq: Every day | ORAL | 3 refills | Status: DC

## 2020-04-10 NOTE — Progress Notes (Signed)
Chief Complaint  Patient presents with  . Follow-up    HPI: Rebecca Rich 75 y.o. come in for Chronic disease management   Fu    Dm: Declines medication as in the past is working on healthy weight loss did see retinal doctor and has some may be changes of early diabetes retinopathy and one eye.  Has a 66-month follow-up.  Blood pressure taking medicine as instructed no questions about this.  Seems to get lower blood pressures at home with her monitor in the high 120 range.  No new chest pain shortness of breath The bony bumps near her clavicle seem to be on both sides now and wants to make sure question will close off.  No excess pain. Denies neuropathy symptoms sees the podiatrist about every 3 months. Delayed on mammogram so far trying to avoid people who could be infected is still not Covid immunized Is waiting for when she goes back to church and is meantime is staying away from high risk situations.  ROS: See pertinent positives and negatives per HPI.  Past Medical History:  Diagnosis Date  . Acute bronchospasm 08/24/2009  . Acute lower GI bleeding 02/16/2017  . Acute perforated appendicitis w abscess s/p lap appendectomy 12/30/2017 12/30/2017  . Acute sphenoidal sinusitis 02/26/2010   Qualifier: Diagnosis of  By: Regis Bill MD, Standley Brooking   . CARDIAC MURMUR 12/08/2006  . COLONIC POLYPS, HX OF 12/08/2006  . DIABETES MELLITUS, TYPE II 12/08/2006  . Gallbladder/common duct stone, without infection, with obstruction 03/01/2010   removed ercp  . HYPERLIPIDEMIA 12/08/2006  . HYPERTENSION 12/08/2006  . 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 INFECTION, SKIN AND SOFT TISSUE 07/28/2008  . 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 LIVER FUNCTION TESTS, ABNORMAL 07/28/2008  . Morbid obesity ([place]) 12/08/2006  . OBESITY 09/24/2009  . OBSTRUCTIVE SLEEP APNEA 12/08/2006  . OSTEOARTHRITIS 12/08/2006  . RUQ PAIN 06/16/2008  . SHOULDER PAIN, RIGHT 02/07/2008  . Sleep apnea   . Swelling of limb 07/28/2008  . THYROID FUNCTION TEST,  ABNORMAL 12/08/2006    Family History  Problem Relation Age of Onset  . Other Mother        blood clots  . Cervical cancer Mother   . Cancer Mother   . Heart disease Brother   . Diabetes Brother   . Heart disease Sister   . Diabetes Sister   . Heart disease Brother   . Diabetes Brother   . Heart disease Brother   . Diabetes Brother   . Stroke Sister   . Diabetes Sister   . Throat cancer Father   . Cancer Father   . Diabetes Other        all siblings, 4 brothers, 5 sisters    Social History   Socioeconomic History  . Marital status: Divorced    Spouse name: Not on file  . Number of children: Not on file  . Years of education: Not on file  . Highest education level: Not on file  Occupational History  . Occupation: retired    Fish farm manager: RETIRED  Tobacco Use  . Smoking status: Never Smoker  . Smokeless tobacco: Never Used  Vaping Use  . Vaping Use: Never used  Substance and Sexual Activity  . Alcohol use: No    Alcohol/week: 0.0 standard drinks  . Drug use: No  . Sexual activity: Not on file  Other Topics Concern  . Not on file  Social History Narrative   Master level education in math   Pt is  currently retired   Pt is divorced with children   Recently had to move had a break in and thus away from her pool exercise   Social Determinants of Health   Financial Resource Strain: Low Risk   . Difficulty of Paying Living Expenses: Not hard at all  Food Insecurity: No Food Insecurity  . Worried About Charity fundraiser in the Last Year: Never true  . Ran Out of Food in the Last Year: Never true  Transportation Needs: No Transportation Needs  . Lack of Transportation (Medical): No  . Lack of Transportation (Non-Medical): No  Physical Activity: Inactive  . Days of Exercise per Week: 0 days  . Minutes of Exercise per Session: 0 min  Stress: No Stress Concern Present  . Feeling of Stress : Not at all  Social Connections: Moderately Integrated  . Frequency of  Communication with Friends and Family: More than three times a week  . Frequency of Social Gatherings with Friends and Family: Once a week  . Attends Religious Services: More than 4 times per year  . Active Member of Clubs or Organizations: Yes  . Attends Archivist Meetings: 1 to 4 times per year  . Marital Status: Divorced    Outpatient Medications Prior to Visit  Medication Sig Dispense Refill  . amLODipine (NORVASC) 5 MG tablet TAKE 1 TABLET BY MOUTH EVERY DAY 90 tablet 0  . chlorthalidone (HYGROTON) 25 MG tablet TAKE 1 TABLET BY MOUTH EVERY DAY 90 tablet 1  . cholecalciferol (VITAMIN D) 1000 UNITS tablet Take 1,000 Units by mouth daily.    Marland Kitchen glucose blood (ACCU-CHEK AVIVA PLUS) test strip Use to check blood sugar daily E11.9 100 each 12  . Lancets (ACCU-CHEK SOFT TOUCH) lancets Use to test blood glucose twice daily 50 each 12  . levothyroxine (SYNTHROID) 75 MCG tablet TAKE 1 TABLET (75 MCG TOTAL) BY MOUTH DAILY BEFORE BREAKFAST. 60 tablet 0  . losartan (COZAAR) 100 MG tablet TAKE 1 TABLET (100 MG TOTAL) BY MOUTH DAILY. MAY BEGIN AT 1/2 TABLET DAILY AND INCREASE TO 1 TABLET 90 tablet 1  . metoprolol succinate (TOPROL-XL) 25 MG 24 hr tablet Take 1 tablet (25 mg total) by mouth daily. Please make overdue appt with Dr. Harrington Challenger before anymore refills. Thank you 1st attempt 30 tablet 0  . Misc Natural Products (OSTEO BI-FLEX ADV JOINT SHIELD PO) Take 1 tablet by mouth daily.    . potassium chloride (KLOR-CON) 10 MEQ tablet TAKE 1 TABLET (10 MEQ TOTAL) BY MOUTH 3 (THREE) TIMES DAILY. 90 tablet 5  . Multiple Vitamins-Minerals (CENTRUM SILVER PO) Take 1 tablet by mouth daily.     No facility-administered medications prior to visit.     EXAM:  BP (!) 142/86 (BP Location: Left Arm, Patient Position: Sitting, Cuff Size: Large)   Pulse 72   Temp 98.7 F (37.1 C) (Oral)   Ht 5\' 1"  (1.549 m)   Wt 259 lb 12.8 oz (117.8 kg)   SpO2 97%   BMI 49.09 kg/m   Body mass index is 49.09  kg/m. Wt Readings from Last 3 Encounters:  04/10/20 259 lb 12.8 oz (117.8 kg)  12/05/19 261 lb (118.4 kg)  09/05/19 270 lb 12.8 oz (122.8 kg)    GENERAL: vitals reviewed and listed above, alert, oriented, appears well hydrated and in no acute distress HEENT: atraumatic, conjunctiva  clear, no obvious abnormalities on inspection of external nose and ears OP : Masked NECK: no obvious masses on inspection palpation  she has prominent sternoclavicular joints on both sides but no mass LUNGS: clear to auscultation bilaterally, no wheezes, rales or rhonchi, good air movement CV: HRRR, no clubbing cyanosis  nl cap refill  MS: moves all extremities without noticeable focal  abnormality PSYCH: pleasant and cooperative, no obvious depression or anxiety Lab Results  Component Value Date   WBC 4.9 04/03/2020   HGB 12.1 04/03/2020   HCT 35.5 (L) 04/03/2020   PLT 168.0 04/03/2020   GLUCOSE 135 (H) 04/03/2020   CHOL 136 04/03/2020   TRIG 111.0 04/03/2020   HDL 51.50 04/03/2020   LDLCALC 62 04/03/2020   ALT 11 04/03/2020   AST 11 04/03/2020   NA 141 04/03/2020   K 4.0 04/03/2020   CL 106 04/03/2020   CREATININE 1.16 04/03/2020   BUN 22 04/03/2020   CO2 26 04/03/2020   TSH 4.07 04/03/2020   HGBA1C 7.0 (H) 04/03/2020   MICROALBUR 1.3 04/03/2020   BP Readings from Last 3 Encounters:  04/10/20 (!) 142/86  12/05/19 118/66  09/05/19 124/66   Lab results reviewed with patient. ASSESSMENT AND PLAN:  Discussed the following assessment and plan:  Diabetes mellitus type 2 in obese [facility])  Medication management  Essential hypertension  BMI 45.0-49.9, adult (HCC)  OSA on CPAP Would like to add medication such as Metformin for blood sugar control currently patient declines and wants to keep working on diet and weight loss. In regard to blood pressure control she is willing to intensify treatment will increase to 7.5 mg of amlodipine to take 5 mg +2.5/day  Plan follow-up in about 3 months  blood pressure control A1c etc. Reviewed that her her urine shows no proteinuria creatinine is stable Control of blood sugar and blood pressure is best for her I affects. Her lipids were pretty good but has not been on a statin medicine consider adding if she agrees.  Will address again at next visit   We will follow  Eye   Exam  Need good control of  -Patient advised to return or notify health care team  if  new concerns arise.  Patient Instructions  Get mammogram at [facility] scheduled.  Intensify bp  meds  As discussed  130/80 and below  Goal average.   Increase amlodipine to 7.5 mg per day ( 5 mg and 2.5 at once ol.)   Continue  healthy weight loss  And will follow up.  Blood sugar  covid  immun as discussed   ROV in 3 months  Can do  a1c at the visit     Mariann Laster K. Josimar Corning M.D.

## 2020-04-10 NOTE — Patient Instructions (Signed)
Get mammogram at [facility] scheduled.  Intensify bp  meds  As discussed  130/80 and below  Goal average.   Increase amlodipine to 7.5 mg per day ( 5 mg and 2.5 at once ol.)   Continue  healthy weight loss  And will follow up.  Blood sugar  covid  immun as discussed   ROV in 3 months  Can do  a1c at the visit

## 2020-04-11 ENCOUNTER — Telehealth: Payer: Self-pay | Admitting: Internal Medicine

## 2020-04-11 NOTE — Telephone Encounter (Signed)
Spoke with Patient, See in result note.

## 2020-04-11 NOTE — Telephone Encounter (Signed)
Pt is returning the call to [place] and would like to have a call back.

## 2020-04-17 ENCOUNTER — Other Ambulatory Visit: Payer: Self-pay | Admitting: Internal Medicine

## 2020-04-26 ENCOUNTER — Other Ambulatory Visit: Payer: Self-pay | Admitting: Internal Medicine

## 2020-05-16 ENCOUNTER — Other Ambulatory Visit: Payer: Self-pay | Admitting: Internal Medicine

## 2020-05-17 ENCOUNTER — Other Ambulatory Visit: Payer: Self-pay | Admitting: Internal Medicine

## 2020-05-24 ENCOUNTER — Other Ambulatory Visit: Payer: Self-pay

## 2020-05-24 ENCOUNTER — Ambulatory Visit: Payer: Medicare HMO | Admitting: Sports Medicine

## 2020-05-24 ENCOUNTER — Encounter: Payer: Self-pay | Admitting: Sports Medicine

## 2020-05-24 DIAGNOSIS — E119 Type 2 diabetes mellitus without complications: Secondary | ICD-10-CM | POA: Diagnosis not present

## 2020-05-24 DIAGNOSIS — I890 Lymphedema, not elsewhere classified: Secondary | ICD-10-CM

## 2020-05-24 DIAGNOSIS — B351 Tinea unguium: Secondary | ICD-10-CM

## 2020-05-24 DIAGNOSIS — M79676 Pain in unspecified toe(s): Secondary | ICD-10-CM

## 2020-05-24 NOTE — Progress Notes (Addendum)
Subjective: Rebecca Rich is a 75 y.o. female patient with history of diabetes who returns to office today complaining of long,mildly painful nails while ambulating in shoes; unable to trim. Patient reports that she had an episode of swelling an episode of pain to left foot/ankle sometimes. No other issues noted.  FBS not checked. Last A1C 6.7.   Patient Active Problem List   Diagnosis Date Noted  . Posterior vitreous detachment of right eye 03/26/2020  . Posterior vitreous detachment of left eye 03/26/2020  . Nuclear sclerotic cataract of both eyes 03/26/2020  . Nonproliferative diabetic retinopathy of both eyes ([place]) 03/26/2020  . Numbness and tingling of left leg 04/05/2018  . Influenza vaccination declined by patient 04/05/2018  . SVT (supraventricular tachycardia) ([place]) 12/31/2017  . Diverticulosis of intestine with bleeding   . Rectal bleeding   . Hx of gout 08/21/2015  . Mild aortic stenosis 08/21/2015  . Muscle spasm 03/22/2013  . Skin lesion of back 09/13/2012  . Left shoulder pain 01/20/2012  . Hypothyroid 05/31/2011  . Elevated uric acid in blood 05/31/2011  . Palmar nodule 03/25/2011  . Abnormal urine 02/20/2011  . 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 SHOULDER PAIN, RIGHT 02/07/2008  . Diabetes mellitus type 2 in obese ([place]) 12/08/2006  . HYPERLIPIDEMIA 12/08/2006  . Morbid obesity with BMI of 50.0-59.9, adult ([place]) 12/08/2006  . OSA on CPAP 12/08/2006  . Essential hypertension 12/08/2006  . OSTEOARTHRITIS 12/08/2006  . THYROID FUNCTION TEST, ABNORMAL 12/08/2006  . COLONIC POLYPS, HX OF 12/08/2006   Current Outpatient Medications on File Prior to Visit  Medication Sig Dispense Refill  . amLODipine (NORVASC) 2.5 MG tablet Take 1 tablet (2.5 mg total) by mouth daily. With 5 mg to make 7.5 total per day 90 tablet 3  . amLODipine (NORVASC) 5 MG tablet TAKE 1 TABLET BY MOUTH EVERY DAY 90 tablet 1  . chlorthalidone (HYGROTON) 25 MG tablet TAKE 1  TABLET BY MOUTH EVERY DAY 90 tablet 1  . cholecalciferol (VITAMIN D) 1000 UNITS tablet Take 1,000 Units by mouth daily.    Marland Kitchen glucose blood (ACCU-CHEK AVIVA PLUS) test strip Use to check blood sugar daily E11.9 100 each 12  . Lancets (ACCU-CHEK SOFT TOUCH) lancets Use to test blood glucose twice daily 50 each 12  . levothyroxine (SYNTHROID) 75 MCG tablet TAKE 1 TABLET BY MOUTH DAILY BEFORE BREAKFAST. 60 tablet 1  . losartan (COZAAR) 100 MG tablet TAKE 1 TABLET (100 MG TOTAL) BY MOUTH DAILY. MAY BEGIN AT 1/2 TABLET DAILY AND INCREASE TO 1 TABLET 90 tablet 1  . metoprolol succinate (TOPROL-XL) 25 MG 24 hr tablet TAKE 1 TABLET DAILY. PLEASE MAKE OVERDUE APPT WITH DR. ROSS BEFORE ANYMORE REFILLS. 15 tablet 0  . Misc Natural Products (OSTEO BI-FLEX ADV JOINT SHIELD PO) Take 1 tablet by mouth daily.    . potassium chloride (KLOR-CON) 10 MEQ tablet TAKE 1 TABLET (10 MEQ TOTAL) BY MOUTH 3 (THREE) TIMES DAILY. 90 tablet 5   No current facility-administered medications on file prior to visit.   No Known Allergies  Recent Results (from the past 2160 hour(s))  Microalbumin / creatinine urine ratio     Status: None   Collection Time: 04/03/20  9:06 AM  Result Value Ref Range   Microalb, Ur 1.3 0.0 - 1.9 mg/dL   Creatinine,U 88.6 mg/dL   Microalb Creat Ratio 1.5 0.0 - 30.0 mg/g  Basic metabolic panel     Status: Abnormal   Collection Time: 04/03/20  9:06 AM  Result Value Ref Range   Sodium 141 135 - 145 mEq/L   Potassium 4.0 3.5 - 5.1 mEq/L   Chloride 106 96 - 112 mEq/L   CO2 26 19 - 32 mEq/L   Glucose, Bld 135 (H) 70 - 99 mg/dL   BUN 22 6 - 23 mg/dL   Creatinine, Ser 1.16 0.40 - 1.20 mg/dL   GFR 45.65 (L) >60.00 mL/min    Comment: Calculated using the CKD-EPI Creatinine Equation (2021)   Calcium 9.4 8.4 - 10.5 mg/dL  CBC with Differential/Platelet     Status: Abnormal   Collection Time: 04/03/20  9:06 AM  Result Value Ref Range   WBC 4.9 4.0 - 10.5 K/uL   RBC 4.04 3.87 - 5.11 Mil/uL    Hemoglobin 12.1 12.0 - 15.0 g/dL   HCT 35.5 (L) 36.0 - 46.0 %   MCV 87.9 78.0 - 100.0 fl   MCHC 34.2 30.0 - 36.0 g/dL   RDW 13.6 11.5 - 15.5 %   Platelets 168.0 150.0 - 400.0 K/uL   Neutrophils Relative % 45.2 43.0 - 77.0 %   Lymphocytes Relative 43.4 12.0 - 46.0 %   Monocytes Relative 8.1 3.0 - 12.0 %   Eosinophils Relative 2.9 0.0 - 5.0 %   Basophils Relative 0.4 0.0 - 3.0 %   Neutro Abs 2.2 1.4 - 7.7 K/uL   Lymphs Abs 2.1 0.7 - 4.0 K/uL   Monocytes Absolute 0.4 0.1 - 1.0 K/uL   Eosinophils Absolute 0.1 0.0 - 0.7 K/uL   Basophils Absolute 0.0 0.0 - 0.1 K/uL  Hemoglobin A1c     Status: Abnormal   Collection Time: 04/03/20  9:06 AM  Result Value Ref Range   Hgb A1c MFr Bld 7.0 (H) 4.6 - 6.5 %    Comment: Glycemic Control Guidelines for People with Diabetes:Non Diabetic:  <6%Goal of Therapy: <7%Additional Action Suggested:  >8%   Hepatic function panel     Status: None   Collection Time: 04/03/20  9:06 AM  Result Value Ref Range   Total Bilirubin 0.4 0.2 - 1.2 mg/dL   Bilirubin, Direct 0.1 0.0 - 0.3 mg/dL   Alkaline Phosphatase 71 39 - 117 U/L   AST 11 0 - 37 U/L   ALT 11 0 - 35 U/L   Total Protein 6.6 6.0 - 8.3 g/dL   Albumin 3.8 3.5 - 5.2 g/dL  TSH     Status: None   Collection Time: 04/03/20  9:06 AM  Result Value Ref Range   TSH 4.07 0.35 - 4.50 uIU/mL  Lipid panel     Status: None   Collection Time: 04/03/20  9:06 AM  Result Value Ref Range   Cholesterol 136 0 - 200 mg/dL    Comment: ATP III Classification       Desirable:  < 200 mg/dL               Borderline High:  200 - 239 mg/dL          High:  > = 240 mg/dL   Triglycerides 111.0 0.0 - 149.0 mg/dL    Comment: Normal:  <150 mg/dLBorderline High:  150 - 199 mg/dL   HDL 51.50 >39.00 mg/dL   VLDL 22.2 0.0 - 40.0 mg/dL   LDL Cholesterol 62 0 - 99 mg/dL   Total CHOL/HDL Ratio 3     Comment:                Men  Women1/2 Average Risk     3.4          3.3Average Risk          5.0          4.42X Average Risk           9.6          7.13X Average Risk          15.0          11.0                       NonHDL 84.51     Comment: NOTE:  Non-HDL goal should be 30 mg/dL higher than patient's LDL goal (i.e. LDL goal of < 70 mg/dL, would have non-HDL goal of < 100 mg/dL)    Objective: General: Patient is awake, alert, and oriented x 3 and in no acute distress.  Integument: Skin is warm, dry, and supple bilateral. Nails are tender, long, thickened and dystrophic with subungual debris, consistent with onychomycosis, 1-5 bilateral.  No signs of infection.   Vasculature:  Dorsalis Pedis pulse 1/4 bilateral. Posterior Tibial pulse 0/4 bilateral, chronic edema today to ankles. Capillary fill time <3 sec 1-5 bilateral. +hyperpigmentation, Positive hair growth to the level of the digits.Temperature gradient within normal limits.   Neurology: The patient has intact sensation measured with a 5.07/10g Semmes Weinstein Monofilament at all pedal sites bilateral as previously noted.  Musculoskeletal:Asymptomatic pes pedal deformities noted bilateral. Muscular strength 5/5 in all lower extremity muscular groups bilateral without pain on range of motion but per patient occasional pain at ankle on left and midfoot that flares from time to time. No tenderness currently.  Assessment and Plan: Problem List Items Addressed This Visit   None   Visit Diagnoses    Pain due to onychomycosis of nail    -  Primary   Diabetes mellitus without complication (HCC)       Lymphedema         -Examined patient. -Re-Discussed and educated patient on diabetic foot care, especially with regards to the vascular, neurological and musculoskeletal systems.  -Mechanically debrided all nails 1-5 bilateral using sterile nail nipper and filed with dremel without incident  -Encourage elevation of legs to assist with edema control like before to help with swelling especially on ankle and advised patient that her PCP may check for gout -Continue with  topical pain cream as needed as well  -Answered all patient questions -Office to call patient for appt for diabetic shoes -Patient to return  in 3 months for at risk foot care.  Landis Martins, DPM

## 2020-06-22 NOTE — Progress Notes (Signed)
Cardiology Office Note   Date:  06/26/2020   ID:  [name], [name] 01/27/1946, MRN [id number]  PCP:  Burnis Medin, MD  Cardiologist:  Dr. Harrington Challenger, MD   Chief Complaint  Patient presents with  . Follow-up    History of Present Illness: Rebecca Rich is a 75 y.o. female who presents for 1 year follow up, seen for Dr. Harrington Challenger.   Rebecca Rich has a hx of hypertension, idiopathic cardiomegaly, mild aortic stenosis, SVT, OSA on CPAP, obesity, DM2, HLD and gout. She was admitted to the hospital in 12/2017 for appendicitis.  She had a couple of brief runs of SVT in the perioperative period. Echocardiogram performed at that time showed normal LV systolic function with very mild aortic stenosis.  She had mildly elevated troponin. Follow-up Myoview on 03/23/2018 was negative for ischemia. Follow-up echocardiogram ordered by pulmonology showed normal EF 50-93%, grade 2 diastolic dysfunction and mild aortic regurgitation and mild aortic stenosis with a mean aortic valve gradient 13 mmHg felt to be relatively stable from previous.  She was last seen by our team 12/31/2018 at which time she was doing very well from a CV standpoint.   Today she is here and states that she has been doing well from a CV standpoint.  She follows with her PCP every 3 months which she reports keeps her on track.  She is now on a low-carb/high-protein diet and has lost approximately 30 pounds.  She was congratulated.  She denies chest pain, LE edema, PND, shortness of breath, orthopnea, palpitations, dizziness or syncope.  She does have reports of bilateral lower extremity leg cramping for the last several weeks.  She feels this is likely in the setting of dehydration.  We will obtain lab work today.  She is on baseline potassium supplementation.  Will adjust as needed.  Past Medical History:  Diagnosis Date  . Acute bronchospasm 08/24/2009  . Acute lower GI bleeding 02/16/2017  . Acute perforated appendicitis w abscess s/p  lap appendectomy 12/30/2017 12/30/2017  . Acute sphenoidal sinusitis 02/26/2010   Qualifier: Diagnosis of  By: Regis Bill MD, Standley Brooking   . CARDIAC MURMUR 12/08/2006  . COLONIC POLYPS, HX OF 12/08/2006  . DIABETES MELLITUS, TYPE II 12/08/2006  . Gallbladder/common duct stone, without infection, with obstruction 03/01/2010   removed ercp  . HYPERLIPIDEMIA 12/08/2006  . HYPERTENSION 12/08/2006  . 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 INFECTION, SKIN AND SOFT TISSUE 07/28/2008  . 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 LIVER FUNCTION TESTS, ABNORMAL 07/28/2008  . Morbid obesity ([place]) 12/08/2006  . OBESITY 09/24/2009  . OBSTRUCTIVE SLEEP APNEA 12/08/2006  . OSTEOARTHRITIS 12/08/2006  . RUQ PAIN 06/16/2008  . SHOULDER PAIN, RIGHT 02/07/2008  . Sleep apnea   . Swelling of limb 07/28/2008  . THYROID FUNCTION TEST, ABNORMAL 12/08/2006    Past Surgical History:  Procedure Laterality Date  . ABDOMINAL HYSTERECTOMY    . CHOLECYSTECTOMY  06/27/2008  . COLONOSCOPY N/A 12/13/2012   Procedure: COLONOSCOPY;  Surgeon: Juanita Craver, MD;  Location: [facility];  Service: Endoscopy;  Laterality: N/A;  . COLONOSCOPY Left 02/17/2017   Procedure: COLONOSCOPY;  Surgeon: Carol Ada, MD;  Location: [facility];  Service: Endoscopy;  Laterality: Left;  . common duct stone     ERCP   . LAPAROSCOPIC APPENDECTOMY N/A 12/30/2017   Procedure: APPENDECTOMY LAPAROSCOPIC;  Surgeon: Stark Klein, MD;  Location: [facility];  Service: General;  Laterality: N/A;     Current Outpatient Medications  Medication Sig Dispense  Refill  . amLODipine (NORVASC) 10 MG tablet Take 1 tablet (10 mg total) by mouth daily. 90 tablet 3  . chlorthalidone (HYGROTON) 25 MG tablet TAKE 1 TABLET BY MOUTH EVERY DAY 90 tablet 1  . cholecalciferol (VITAMIN D) 1000 UNITS tablet Take 1,000 Units by mouth daily.    Marland Kitchen glucose blood (ACCU-CHEK AVIVA PLUS) test strip Use to check blood sugar daily E11.9 100 each 12  . Lancets (ACCU-CHEK SOFT TOUCH) lancets Use to test blood glucose  twice daily 50 each 12  . levothyroxine (SYNTHROID) 75 MCG tablet TAKE 1 TABLET BY MOUTH DAILY BEFORE BREAKFAST. 60 tablet 1  . losartan (COZAAR) 100 MG tablet TAKE 1 TABLET (100 MG TOTAL) BY MOUTH DAILY. MAY BEGIN AT 1/2 TABLET DAILY AND INCREASE TO 1 TABLET 90 tablet 1  . Misc Natural Products (OSTEO BI-FLEX ADV JOINT SHIELD PO) Take 1 tablet by mouth daily.    . potassium chloride (KLOR-CON) 10 MEQ tablet Take 30 mEq by mouth daily.    . metoprolol succinate (TOPROL-XL) 25 MG 24 hr tablet TAKE 1 TABLET DAILY. 90 tablet 3   No current facility-administered medications for this visit.    Allergies:   Patient has no known allergies.    Social History:  The patient  reports that she has never smoked. She has never used smokeless tobacco. She reports that she does not drink alcohol and does not use drugs.   Family History:  The patient's family history includes Cancer in her father and mother; Cervical cancer in her mother; Diabetes in her brother, brother, brother, sister, sister, and another family member; Heart disease in her brother, brother, brother, and sister; Other in her mother; Stroke in her sister; Throat cancer in her father.    ROS:  Please see the history of present illness. Otherwise, review of systems are positive for none.   All other systems are reviewed and negative.    PHYSICAL EXAM: VS:  BP 140/70   Pulse 80   Ht 5\' 1"  (1.549 m)   Wt 255 lb 3.2 oz (115.8 kg)   SpO2 98%   BMI 48.22 kg/m  , BMI Body mass index is 48.22 kg/m.   General: Well developed, well nourished, NAD Neck: Negative for carotid bruits. No JVD Lungs:Clear to ausculation bilaterally.  Breathing is unlabored. Cardiovascular: RRR with S1 S2. No murmurs Extremities: No edema.  Radial pulses 2+ bilaterally Neuro: Alert and oriented. No focal deficits. No facial asymmetry. MAE spontaneously. Psych: Responds to questions appropriately with normal affect.     EKG:  EKG is ordered today. The ekg  ordered today demonstrates NSR with HR 80bpm and no change from prior tracings   Recent Labs: 04/03/2020: ALT 11; BUN 22; Creatinine, Ser 1.16; Hemoglobin 12.1; Platelets 168.0; Potassium 4.0; Sodium 141; TSH 4.07    Lipid Panel    Component Value Date/Time   CHOL 136 04/03/2020 0906   TRIG 111.0 04/03/2020 0906   HDL 51.50 04/03/2020 0906   CHOLHDL 3 04/03/2020 0906   VLDL 22.2 04/03/2020 0906   LDLCALC 62 04/03/2020 0906     Wt Readings from Last 3 Encounters:  06/26/20 255 lb 3.2 oz (115.8 kg)  04/10/20 259 lb 12.8 oz (117.8 kg)  12/05/19 261 lb (118.4 kg)    Other studies Reviewed: Additional studies/ records that were reviewed today include:  Review of the above records demonstrates:   Echocardiogram 12/15/2018 IMPRESSIONS  1. Left ventricular ejection fraction, by visual estimation, is 60 to 65%. The  left ventricle has normal function. There is moderately increased left ventricular hypertrophy. 2. Left ventricular diastolic parameters are consistent with Grade II diastolic dysfunction (pseudonormalization). 3. Global right ventricle has normal systolic function.The right ventricular size is normal. No increase in right ventricular wall thickness. 4. Left atrial size was mildly dilated. 5. Right atrial size was normal. 6. Moderate calcification of the mitral valve leaflet(s). 7. Moderate thickening of the mitral valve leaflet(s). 8. The mitral valve is normal in structure. Trace mitral valve regurgitation. 9. The tricuspid valve is normal in structure. Tricuspid valve regurgitation is mild. 10. The aortic valve has an indeterminant number of cusps. Aortic valve regurgitation is mild. Mild aortic valve stenosis. 11. The pulmonic valve was grossly normal. Pulmonic valve regurgitation is mild.  ASSESSMENT AND PLAN:  1. Hx of SVT: -No reports of recent>> appears episode occurred surrounding appendix surgery 12/2017 -EKG stable today with NSR -Continue Toprol  XL  2.  HTN:  -Recent up titration of amlodipine to 7.5 per PCP>> SBP today in the 140 range which appears to be consistent with home readings therefore will increase amlodipine to 10 mg daily -Continue losartan 100 mg, Toprol XL 25 mg, chlorthalidone 25 mg with K+ supplementation at 30 mill equivalent 3 times daily>> I have encouraged her to condense this to daily dosing> changed on med rec  3. Mild AS/AR: -Most recent echocardiogram 11/2018 with aortic valve gradient at 13 mmHg with no recent symptoms suggesting any change  -Continue to monitor clinically for now  4.  Obesity: -Reports recent diet change with low-carb/high-protein diet and has lost approximately 30 pounds in the last 9 months -Working closely with PCP  5.  Leg cramping: -Likely in the setting of dehydration as this has been occurring since symptoms reaching greater than 90 F.  Reports poor oral hydration.  We will obtain lab work today.  Encouraged adequate plain water hydration throughout the day.  Also encouraged increasing potassium rich foods including potatoes, citrus fruits and bananas   Current medicines are reviewed at length with the patient today.  The patient does not have concerns regarding medicines.  The following changes have been made: Increase amlodipine to 10 mg p.o. daily  Labs/ tests ordered today include: BMET  Orders Placed This Encounter  Procedures  . Basic metabolic panel  . EKG 12-Lead   Disposition:   FU with Dr. Harrington Challenger in 1 year  Signed, Kathyrn Drown, NP  06/26/2020 11:09 AM    [address], [place], [place]  [address] Phone: [phone]; Fax: [phone] \

## 2020-06-26 ENCOUNTER — Other Ambulatory Visit: Payer: Self-pay

## 2020-06-26 ENCOUNTER — Encounter: Payer: Self-pay | Admitting: Cardiology

## 2020-06-26 ENCOUNTER — Ambulatory Visit: Payer: Medicare HMO | Admitting: Cardiology

## 2020-06-26 VITALS — BP 140/70 | HR 80 | Ht 61.0 in | Wt 255.2 lb

## 2020-06-26 DIAGNOSIS — I10 Essential (primary) hypertension: Secondary | ICD-10-CM

## 2020-06-26 DIAGNOSIS — I471 Supraventricular tachycardia: Secondary | ICD-10-CM | POA: Diagnosis not present

## 2020-06-26 DIAGNOSIS — R252 Cramp and spasm: Secondary | ICD-10-CM

## 2020-06-26 DIAGNOSIS — I350 Nonrheumatic aortic (valve) stenosis: Secondary | ICD-10-CM | POA: Diagnosis not present

## 2020-06-26 LAB — BASIC METABOLIC PANEL
BUN/Creatinine Ratio: 17 (ref 12–28)
BUN: 20 mg/dL (ref 8–27)
CO2: 22 mmol/L (ref 20–29)
Calcium: 9.7 mg/dL (ref 8.7–10.3)
Chloride: 102 mmol/L (ref 96–106)
Creatinine, Ser: 1.18 mg/dL — ABNORMAL HIGH (ref 0.57–1.00)
Glucose: 113 mg/dL — ABNORMAL HIGH (ref 65–99)
Potassium: 3.6 mmol/L (ref 3.5–5.2)
Sodium: 140 mmol/L (ref 134–144)
eGFR: 48 mL/min/{1.73_m2} — ABNORMAL LOW (ref >59)

## 2020-06-26 MED ORDER — AMLODIPINE BESYLATE 10 MG PO TABS: 10.0000 mg | ORAL_TABLET | Freq: Every day | ORAL | 3 refills | Status: DC

## 2020-06-26 MED ORDER — METOPROLOL SUCCINATE ER 25 MG PO TB24: ORAL_TABLET | ORAL | 3 refills | Status: DC

## 2020-06-26 NOTE — Patient Instructions (Addendum)
Medication Instructions:  Your physician has recommended you make the following change in your medication:  1. INCREASE AMLODIPINE TO 10 MG DAILY.  *If you need a refill on your cardiac medications before your next appointment, please call your pharmacy*   Lab Work: TODAY: BMET If you have labs (blood work) drawn today and your tests are completely normal, you will receive your results only by: Marland Kitchen MyChart Message (if you have MyChart) OR . A paper copy in the mail If you have any lab test that is abnormal or we need to change your treatment, we will call you to review the results.   Testing/Procedures: NONE   Follow-Up: At [facility], you and your health needs are our priority.  As part of our continuing mission to provide you with exceptional heart care, we have created designated Provider Care Teams.  These Care Teams include your primary Cardiologist (physician) and Advanced Practice Providers (APPs -  Physician Assistants and Nurse Practitioners) who all work together to provide you with the care you need, when you need it.  We recommend signing up for the patient portal called "MyChart".  Sign up information is provided on this After Visit Summary.  MyChart is used to connect with patients for Virtual Visits (Telemedicine).  Patients are able to view lab/test results, encounter notes, upcoming appointments, etc.  Non-urgent messages can be sent to your provider as well.   To learn more about what you can do with MyChart, go to [email or web address].    Your next appointment:   1 year(s)  The format for your next appointment:   In Person  Provider:   You may see Dorris Carnes, MD or one of the following Advanced Practice Providers on your designated Care Team:    Richardson Dopp, PA-C  [place], [place]

## 2020-06-28 ENCOUNTER — Other Ambulatory Visit: Payer: Self-pay | Admitting: Internal Medicine

## 2020-07-09 ENCOUNTER — Other Ambulatory Visit: Payer: Self-pay | Admitting: Internal Medicine

## 2020-07-10 ENCOUNTER — Other Ambulatory Visit: Payer: Self-pay

## 2020-07-11 ENCOUNTER — Encounter: Payer: Self-pay | Admitting: Internal Medicine

## 2020-07-11 ENCOUNTER — Ambulatory Visit (INDEPENDENT_AMBULATORY_CARE_PROVIDER_SITE_OTHER): Payer: Medicare HMO | Admitting: Internal Medicine

## 2020-07-11 VITALS — BP 132/72 | HR 66 | Temp 99.0°F | Ht 61.0 in | Wt 258.8 lb

## 2020-07-11 DIAGNOSIS — I10 Essential (primary) hypertension: Secondary | ICD-10-CM | POA: Diagnosis not present

## 2020-07-11 DIAGNOSIS — Z79899 Other long term (current) drug therapy: Secondary | ICD-10-CM

## 2020-07-11 DIAGNOSIS — E1169 Type 2 diabetes mellitus with other specified complication: Secondary | ICD-10-CM

## 2020-07-11 DIAGNOSIS — E11319 Type 2 diabetes mellitus with unspecified diabetic retinopathy without macular edema: Secondary | ICD-10-CM | POA: Diagnosis not present

## 2020-07-11 DIAGNOSIS — E669 Obesity, unspecified: Secondary | ICD-10-CM | POA: Diagnosis not present

## 2020-07-11 LAB — POCT GLYCOSYLATED HEMOGLOBIN (HGB A1C): Hemoglobin A1C: 6.2 % — AB (ref 4.0–5.6)

## 2020-07-11 NOTE — Progress Notes (Signed)
Chief Complaint  Patient presents with   Follow-up    HPI: Rebecca Rich 75 y.o. come in for Chronic disease management   BP  inc to amlodipine 10  by cards  doing ok so far  DM  fu today .  Has lost weight   dark chocolate  stalled but then better  Obesity  goal 20 #  more in next 6 mos/ Sleep  better with  sleep number.  Adjustable   helps back and hip feels better and snoring better  With cpap.   Still  hesitant  for covid vaccine still can get infection and concern se possible Has declined  meds statins so far  Notes say has  retinopathy Wt Readings from Last 3 Encounters:  07/11/20 258 lb 12.8 oz (117.4 kg)  06/26/20 255 lb 3.2 oz (115.8 kg)  04/10/20 259 lb 12.8 oz (117.8 kg)     Would like to add medication such as Metformin for blood sugar control currently patient declines and wants to keep working on diet and weight loss. In regard to blood pressure control she is willing to intensify treatment will increase to 7.5 mg of amlodipine to take 5 mg +2.5/day Plan follow-up in about 3 months blood pressure control A1c etc. Reviewed that her her urine shows no proteinuria creatinine is stable Control of blood sugar and blood pressure is best for her I affects. Her lipids were pretty good but has not been on a statin medicine consider adding if she agrees.  Will address again at next visit   We will follow  HTN: -Recent up titration of amlodipine to 7.5 per PCP>> SBP today in the 140 range which appears to be consistent with home readings therefore will increase amlodipine to 10 mg daily -Continue losartan 100 mg, Toprol XL 25 mg, chlorthalidone 25 mg with K+ supplementation at 30 mill equivalent 3 times daily>> I have encouraged her to condense this to daily dosing> changed on med rec ROS: See pertinent positives and negatives per HPI.  Past Medical History:  Diagnosis Date   Acute bronchospasm 08/24/2009   Acute lower GI bleeding 02/16/2017   Acute perforated  appendicitis w abscess s/p lap appendectomy 12/30/2017 12/30/2017   Acute sphenoidal sinusitis 02/26/2010   Qualifier: Diagnosis of  By: Regis Bill MD, Standley Brooking    CARDIAC MURMUR 12/08/2006   COLONIC POLYPS, HX OF 12/08/2006   DIABETES MELLITUS, TYPE II 12/08/2006   Gallbladder/common duct stone, without infection, with obstruction 03/01/2010   removed ercp   HYPERLIPIDEMIA 12/08/2006   HYPERTENSION 12/08/2006   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   INFECTION, 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   LIVER FUNCTION TESTS, ABNORMAL 07/28/2008   Morbid obesity ([place]) 12/08/2006   OBESITY 09/24/2009   OBSTRUCTIVE SLEEP APNEA 12/08/2006   OSTEOARTHRITIS 12/08/2006   RUQ PAIN 06/16/2008   SHOULDER PAIN, RIGHT 02/07/2008   Sleep apnea    Swelling of limb 07/28/2008   THYROID FUNCTION TEST, ABNORMAL 12/08/2006    Family History  Problem Relation Age of Onset   Other Mother        blood clots   Cervical cancer Mother    Cancer Mother    Heart disease Brother    Diabetes Brother    Heart disease Sister    Diabetes Sister    Heart disease Brother    Diabetes Brother    Heart disease Brother    Diabetes Brother    Stroke Sister    Diabetes  Sister    Throat cancer Father    Cancer Father    Diabetes Other        all siblings, 4 brothers, 38 sisters    Social History   Socioeconomic History   Marital status: Divorced    Spouse name: Not on file   Number of children: Not on file   Years of education: Not on file   Highest education level: Not on file  Occupational History   Occupation: retired    Fish farm manager: RETIRED  Tobacco Use   Smoking status: Never   Smokeless tobacco: Never  Vaping Use   Vaping Use: Never used  Substance and Sexual Activity   Alcohol use: No    Alcohol/week: 0.0 standard drinks   Drug use: No   Sexual activity: Not on file  Other Topics Concern   Not on file  Social History Narrative   Master level education in math   Pt is currently retired   Pt is  divorced with children   Recently had to move had a break in and thus away from her pool exercise   Social Determinants of Health   Financial Resource Strain: Low Risk    Difficulty of Paying Living Expenses: Not hard at all  Food Insecurity: No Food Insecurity   Worried About Charity fundraiser in the Last Year: Never true   Arboriculturist in the Last Year: Never true  Transportation Needs: No Transportation Needs   Lack of Transportation (Medical): No   Lack of Transportation (Non-Medical): No  Physical Activity: Inactive   Days of Exercise per Week: 0 days   Minutes of Exercise per Session: 0 min  Stress: No Stress Concern Present   Feeling of Stress : Not at all  Social Connections: Moderately Integrated   Frequency of Communication with Friends and Family: More than three times a week   Frequency of Social Gatherings with Friends and Family: Once a week   Attends Religious Services: More than 4 times per year   Active Member of [employer] or Organizations: Yes   Attends Archivist Meetings: 1 to 4 times per year   Marital Status: Divorced    Outpatient Medications Prior to Visit  Medication Sig Dispense Refill   amLODipine (NORVASC) 10 MG tablet Take 1 tablet (10 mg total) by mouth daily. 90 tablet 3   chlorthalidone (HYGROTON) 25 MG tablet TAKE 1 TABLET BY MOUTH EVERY DAY 90 tablet 0   cholecalciferol (VITAMIN D) 1000 UNITS tablet Take 1,000 Units by mouth daily.     glucose blood (ACCU-CHEK AVIVA PLUS) test strip Use to check blood sugar daily E11.9 100 each 12   Lancets (ACCU-CHEK SOFT TOUCH) lancets Use to test blood glucose twice daily 50 each 12   levothyroxine (SYNTHROID) 75 MCG tablet TAKE 1 TABLET BY MOUTH EVERY DAY BEFORE BREAKFAST 60 tablet 1   losartan (COZAAR) 100 MG tablet TAKE 1 TABLET (100 MG TOTAL) BY MOUTH DAILY. MAY BEGIN AT 1/2 TABLET DAILY AND INCREASE TO 1 TABLET 90 tablet 1   metoprolol succinate (TOPROL-XL) 25 MG 24 hr tablet TAKE 1 TABLET  DAILY. 90 tablet 3   Misc Natural Products (OSTEO BI-FLEX ADV JOINT SHIELD PO) Take 1 tablet by mouth daily.     potassium chloride (KLOR-CON) 10 MEQ tablet Take 30 mEq by mouth daily.     No facility-administered medications prior to visit.     EXAM:  BP 132/72 (BP Location: Left Arm, Patient Position:  Sitting, Cuff Size: Large)   Pulse 66   Temp 99 F (37.2 C) (Oral)   Ht 5\' 1"  (1.549 m)   Wt 258 lb 12.8 oz (117.4 kg)   SpO2 97%   BMI 48.90 kg/m   Body mass index is 48.9 kg/m.  GENERAL: vitals reviewed and listed above, alert, oriented, appears well hydrated and in no acute distress HEENT: atraumatic, conjunctiva  clear, no obvious abnormalities on inspection of external nose and ears OP : masked  NECK: no obvious masses on inspection palpation  LUNGS: clear to auscultation bilaterally, no wheezes, rales or rhonchi, good air movement CV: HRRR,2/6 sem no clubbing cyanosis or  slight edema nl cap refill  MS: moves all extremities without feet clear without ulcers or lesion PSYCH: pleasant and cooperative, no obvious depression or anxiety Lab Results  Component Value Date   WBC 4.9 04/03/2020   HGB 12.1 04/03/2020   HCT 35.5 (L) 04/03/2020   PLT 168.0 04/03/2020   GLUCOSE 113 (H) 06/26/2020   CHOL 136 04/03/2020   TRIG 111.0 04/03/2020   HDL 51.50 04/03/2020   LDLCALC 62 04/03/2020   ALT 11 04/03/2020   AST 11 04/03/2020   NA 140 06/26/2020   K 3.6 06/26/2020   CL 102 06/26/2020   CREATININE 1.18 (H) 06/26/2020   BUN 20 06/26/2020   CO2 22 06/26/2020   TSH 4.07 04/03/2020   HGBA1C 6.2 (A) 07/11/2020   MICROALBUR 1.3 04/03/2020   BP Readings from Last 3 Encounters:  07/11/20 132/72  06/26/20 140/70  04/10/20 (!) 142/86    ASSESSMENT AND PLAN:  Discussed the following assessment and plan:  Diabetes mellitus type 2 in obese ([place]) - Plan: POCT glycosylated hemoglobin (Hb A1C)  Medication management  Essential hypertension  Diabetic retinopathy  associated with type 2 diabetes mellitus, macular edema presence unspecified, unspecified laterality, unspecified retinopathy severity ([place]) Improved bg control  .  She is pleased .  Bp control. Is willing to disc lipid med at f/u if all is well. Advised initial vaccine for protection severe disease for her risk  score is 5  Review counsel plan  exam  30 minutes -Patient advised to return or notify health care team  if  new concerns arise.  Patient Instructions  A1c is better    very good  today  Continue lifestyle intervention healthy eating and exercise .   Plan 4 mos ROV  and   address  cholesterol medication at that time.   I still  advise initial covid vaccine 19 .    Standley Brooking. Derrick Tiegs M.D.

## 2020-07-11 NOTE — Patient Instructions (Signed)
A1c is better    very good  today  Continue lifestyle intervention healthy eating and exercise .   Plan 4 mos ROV  and   address  cholesterol medication at that time.   I still  advise initial covid vaccine 19 .

## 2020-07-18 ENCOUNTER — Other Ambulatory Visit: Payer: Self-pay | Admitting: Internal Medicine

## 2020-07-19 NOTE — Telephone Encounter (Signed)
Left a message for the patient to return my call.  

## 2020-07-19 NOTE — Telephone Encounter (Signed)
I do not know you may have to review the chart and asked the patient first and then get back with me.  Can ask Maddie to look at this also

## 2020-07-26 NOTE — Telephone Encounter (Signed)
Confirmed taking tid

## 2020-08-23 ENCOUNTER — Ambulatory Visit: Payer: Medicare HMO | Admitting: Sports Medicine

## 2020-08-23 ENCOUNTER — Other Ambulatory Visit: Payer: Self-pay

## 2020-08-23 ENCOUNTER — Encounter: Payer: Self-pay | Admitting: Sports Medicine

## 2020-08-23 DIAGNOSIS — M79676 Pain in unspecified toe(s): Secondary | ICD-10-CM | POA: Diagnosis not present

## 2020-08-23 DIAGNOSIS — M79609 Pain in unspecified limb: Secondary | ICD-10-CM

## 2020-08-23 DIAGNOSIS — E119 Type 2 diabetes mellitus without complications: Secondary | ICD-10-CM

## 2020-08-23 DIAGNOSIS — B351 Tinea unguium: Secondary | ICD-10-CM | POA: Diagnosis not present

## 2020-08-23 DIAGNOSIS — I890 Lymphedema, not elsewhere classified: Secondary | ICD-10-CM

## 2020-08-23 NOTE — Progress Notes (Signed)
Subjective: Rebecca Rich is a 75 y.o. female patient with history of diabetes who returns to office today complaining of long,mildly painful nails while ambulating in shoes; unable to trim. Patient reports that she is doing good with no pain.  No other issues noted.  FBS not checked. Last A1C 6.3 she believes.  Patient Active Problem List   Diagnosis Date Noted   Posterior vitreous detachment of right eye 03/26/2020   Posterior vitreous detachment of left eye 03/26/2020   Nuclear sclerotic cataract of both eyes 03/26/2020   Nonproliferative diabetic retinopathy of both eyes ([place]) 03/26/2020   Numbness and tingling of left leg 04/05/2018   Influenza vaccination declined by patient 04/05/2018   SVT (supraventricular tachycardia) ([place]) 12/31/2017   Diverticulosis of intestine with bleeding    Rectal bleeding    Hx of gout 08/21/2015   Mild aortic stenosis 08/21/2015   Muscle spasm 03/22/2013   Skin lesion of back 09/13/2012   Left shoulder pain 01/20/2012   Hypothyroid 05/31/2011   Elevated uric acid in blood 05/31/2011   Palmar nodule 03/25/2011   Abnormal urine 02/20/2011   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   SHOULDER PAIN, RIGHT 02/07/2008   Diabetes mellitus type 2 in obese ([place]) 12/08/2006   HYPERLIPIDEMIA 12/08/2006   Morbid obesity with BMI of 50.0-59.9, adult ([place]) 12/08/2006   OSA on CPAP 12/08/2006   Essential hypertension 12/08/2006   OSTEOARTHRITIS 12/08/2006   THYROID FUNCTION TEST, ABNORMAL 12/08/2006   COLONIC POLYPS, HX OF 12/08/2006   Current Outpatient Medications on File Prior to Visit  Medication Sig Dispense Refill   amLODipine (NORVASC) 10 MG tablet Take 1 tablet (10 mg total) by mouth daily. 90 tablet 3   chlorthalidone (HYGROTON) 25 MG tablet TAKE 1 TABLET BY MOUTH EVERY DAY 90 tablet 0   cholecalciferol (VITAMIN D) 1000 UNITS tablet Take 1,000 Units by mouth daily.     glucose blood (ACCU-CHEK AVIVA PLUS) test strip Use to check  blood sugar daily E11.9 100 each 12   Lancets (ACCU-CHEK SOFT TOUCH) lancets Use to test blood glucose twice daily 50 each 12   levothyroxine (SYNTHROID) 75 MCG tablet TAKE 1 TABLET BY MOUTH EVERY DAY BEFORE BREAKFAST 60 tablet 1   losartan (COZAAR) 100 MG tablet TAKE 1 TABLET (100 MG TOTAL) BY MOUTH DAILY. MAY BEGIN AT 1/2 TABLET DAILY AND INCREASE TO 1 TABLET 90 tablet 1   metoprolol succinate (TOPROL-XL) 25 MG 24 hr tablet TAKE 1 TABLET DAILY. 90 tablet 3   Misc Natural Products (OSTEO BI-FLEX ADV JOINT SHIELD PO) Take 1 tablet by mouth daily.     potassium chloride (KLOR-CON) 10 MEQ tablet TAKE 1 TABLET (10 MEQ TOTAL) BY MOUTH 3 (THREE) TIMES DAILY. 90 tablet 5   No current facility-administered medications on file prior to visit.   No Known Allergies  Recent Results (from the past 2160 hour(s))  Basic metabolic panel     Status: Abnormal   Collection Time: 06/26/20 11:15 AM  Result Value Ref Range   Glucose 113 (H) 65 - 99 mg/dL   BUN 20 8 - 27 mg/dL   Creatinine, Ser 1.18 (H) 0.57 - 1.00 mg/dL   eGFR 48 (L) >59 mL/min/1.73   BUN/Creatinine Ratio 17 12 - 28   Sodium 140 134 - 144 mmol/L   Potassium 3.6 3.5 - 5.2 mmol/L   Chloride 102 96 - 106 mmol/L   CO2 22 20 - 29 mmol/L   Calcium 9.7 8.7 - 10.3 mg/dL  POCT glycosylated hemoglobin (Hb A1C)     Status: Abnormal   Collection Time: 07/11/20  5:14 PM  Result Value Ref Range   Hemoglobin A1C 6.2 (A) 4.0 - 5.6 %   HbA1c POC (<> result, manual entry)     HbA1c, POC (prediabetic range)     HbA1c, POC (controlled diabetic range)      Objective: General: Patient is awake, alert, and oriented x 3 and in no acute distress.  Integument: Skin is warm, dry, and supple bilateral. Nails are tender, long, thickened and dystrophic with subungual debris, consistent with onychomycosis, 1-5 bilateral.  No signs of infection.   Vasculature:  Dorsalis Pedis pulse 1/4 bilateral. Posterior Tibial pulse 0/4 bilateral, chronic edema today to  ankles. Capillary fill time <3 sec 1-5 bilateral. +hyperpigmentation, Positive hair growth to the level of the digits.Temperature gradient within normal limits.   Neurology: The patient has intact sensation measured with a 5.07/10g Semmes Weinstein Monofilament at all pedal sites bilateral as previously noted.  Musculoskeletal:Asymptomatic pes pedal deformities noted bilateral. Muscular strength 5/5 in all lower extremity muscular groups bilateral without pain on range of motion.   Assessment and Plan: Problem List Items Addressed This Visit   None Visit Diagnoses     Pain due to onychomycosis of nail    -  Primary   Diabetes mellitus without complication (HCC)       Lymphedema          -Examined patient. -Re-Discussed and educated patient on diabetic foot care, especially with regards to the vascular, neurological and musculoskeletal systems.  -Mechanically debrided all nails 1-5 bilateral using sterile nail nipper and filed with dremel without incident  -Answered all patient questions -Patient to return  in 3 months for at risk foot care.  Landis Martins, DPM

## 2020-09-24 ENCOUNTER — Other Ambulatory Visit: Payer: Self-pay

## 2020-09-24 ENCOUNTER — Encounter (INDEPENDENT_AMBULATORY_CARE_PROVIDER_SITE_OTHER): Payer: Self-pay | Admitting: Ophthalmology

## 2020-09-24 ENCOUNTER — Ambulatory Visit (INDEPENDENT_AMBULATORY_CARE_PROVIDER_SITE_OTHER): Payer: Medicare HMO | Admitting: Ophthalmology

## 2020-09-24 DIAGNOSIS — H2513 Age-related nuclear cataract, bilateral: Secondary | ICD-10-CM | POA: Diagnosis not present

## 2020-09-24 DIAGNOSIS — G4733 Obstructive sleep apnea (adult) (pediatric): Secondary | ICD-10-CM

## 2020-09-24 DIAGNOSIS — Z9989 Dependence on other enabling machines and devices: Secondary | ICD-10-CM | POA: Diagnosis not present

## 2020-09-24 DIAGNOSIS — E113293 Type 2 diabetes mellitus with mild nonproliferative diabetic retinopathy without macular edema, bilateral: Secondary | ICD-10-CM | POA: Diagnosis not present

## 2020-09-24 NOTE — Progress Notes (Signed)
09/24/2020     CHIEF COMPLAINT Patient presents for  Chief Complaint  Patient presents with   Retina Follow Up    3 Year Diabetic F/U OU  Pt c/o decreased near [place] OU. Pt denies flashes or floaters. DVA stable OU.  A1c: 6.3, 11/2019 LBS: 111 this AM      HISTORY OF PRESENT ILLNESS: Rebecca GREVIOUS is a 75 y.o. female who presents to the clinic today for:   HPI     Retina Follow Up   Patient presents with  Diabetic Retinopathy.  In both eyes.  This started 6 months ago.  Severity is mild.  Duration of 6 months.  Since onset it is gradually worsening. Additional comments: 3 Year Diabetic F/U OU  Pt c/o decreased near [place]. Pt denies flashes or floaters. DVA stable OU.  A1c: 6.3, 11/2019 LBS: 111 this AM        Comments   6 mos fu ou oct Patient states vision is stable and unchanged since last visit. Denies any new floaters or FOL. Pt states "I sometimes get a twitch in my right eyelid, it just started a month and a half ago." A1C: 6.2, June 2022 BS: 125 this morning      Last edited by Laurin Coder, COA on 09/24/2020  8:18 AM.      Referring physician: Burnis Medin, MD [place],  [place] [address]  HISTORICAL INFORMATION:   Selected notes from the MEDICAL RECORD NUMBER    Lab Results  Component Value Date   HGBA1C 6.2 (A) 07/11/2020     CURRENT MEDICATIONS: No current outpatient medications on file. (Ophthalmic Drugs)   No current facility-administered medications for this visit. (Ophthalmic Drugs)   Current Outpatient Medications (Other)  Medication Sig   amLODipine (NORVASC) 10 MG tablet Take 1 tablet (10 mg total) by mouth daily.   chlorthalidone (HYGROTON) 25 MG tablet TAKE 1 TABLET BY MOUTH EVERY DAY   cholecalciferol (VITAMIN D) 1000 UNITS tablet Take 1,000 Units by mouth daily.   glucose blood (ACCU-CHEK AVIVA PLUS) test strip Use to check blood sugar daily E11.9   Lancets (ACCU-CHEK SOFT TOUCH) lancets Use to test  blood glucose twice daily   levothyroxine (SYNTHROID) 75 MCG tablet TAKE 1 TABLET BY MOUTH EVERY DAY BEFORE BREAKFAST   losartan (COZAAR) 100 MG tablet TAKE 1 TABLET (100 MG TOTAL) BY MOUTH DAILY. MAY BEGIN AT 1/2 TABLET DAILY AND INCREASE TO 1 TABLET   metoprolol succinate (TOPROL-XL) 25 MG 24 hr tablet TAKE 1 TABLET DAILY.   Misc Natural Products (OSTEO BI-FLEX ADV JOINT SHIELD PO) Take 1 tablet by mouth daily.   potassium chloride (KLOR-CON) 10 MEQ tablet TAKE 1 TABLET (10 MEQ TOTAL) BY MOUTH 3 (THREE) TIMES DAILY.   No current facility-administered medications for this visit. (Other)      REVIEW OF SYSTEMS:    ALLERGIES No Known Allergies  PAST MEDICAL HISTORY Past Medical History:  Diagnosis Date   Acute bronchospasm 08/24/2009   Acute lower GI bleeding 02/16/2017   Acute perforated appendicitis w abscess s/p lap appendectomy 12/30/2017 12/30/2017   Acute sphenoidal sinusitis 02/26/2010   Qualifier: Diagnosis of  By: Regis Bill MD, Standley Brooking    CARDIAC MURMUR 12/08/2006   COLONIC POLYPS, HX OF 12/08/2006   DIABETES MELLITUS, TYPE II 12/08/2006   Gallbladder/common duct stone, without infection, with obstruction 03/01/2010   removed ercp   HYPERLIPIDEMIA 12/08/2006   HYPERTENSION 12/08/2006   Idiopathic cardiomegaly  01/31/2010   INFECTION, 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   LIVER FUNCTION TESTS, ABNORMAL 07/28/2008   Morbid obesity ([place]) 12/08/2006   OBESITY 09/24/2009   OBSTRUCTIVE SLEEP APNEA 12/08/2006   OSTEOARTHRITIS 12/08/2006   RUQ PAIN 06/16/2008   SHOULDER PAIN, RIGHT 02/07/2008   Sleep apnea    Swelling of limb 07/28/2008   THYROID FUNCTION TEST, ABNORMAL 12/08/2006   Past Surgical History:  Procedure Laterality Date   ABDOMINAL HYSTERECTOMY     CHOLECYSTECTOMY  06/27/2008   COLONOSCOPY N/A 12/13/2012   Procedure: COLONOSCOPY;  Surgeon: Juanita Craver, MD;  Location: [facility];  Service: Endoscopy;  Laterality: N/A;   COLONOSCOPY Left 02/17/2017    Procedure: COLONOSCOPY;  Surgeon: Carol Ada, MD;  Location: [place];  Service: Endoscopy;  Laterality: Left;   common duct stone     ERCP    LAPAROSCOPIC APPENDECTOMY N/A 12/30/2017   Procedure: APPENDECTOMY LAPAROSCOPIC;  Surgeon: Stark Klein, MD;  Location: [facility];  Service: General;  Laterality: N/A;    FAMILY HISTORY Family History  Problem Relation Age of Onset   Other Mother        blood clots   Cervical cancer Mother    Cancer Mother    Heart disease Brother    Diabetes Brother    Heart disease Sister    Diabetes Sister    Heart disease Brother    Diabetes Brother    Heart disease Brother    Diabetes Brother    Stroke Sister    Diabetes Sister    Throat cancer Father    Cancer Father    Diabetes Other        all siblings, 4 brothers, 5 sisters    SOCIAL HISTORY Social History   Tobacco Use   Smoking status: Never   Smokeless tobacco: Never  Vaping Use   Vaping Use: Never used  Substance Use Topics   Alcohol use: No    Alcohol/week: 0.0 standard drinks   Drug use: No         OPHTHALMIC EXAM:  Base Eye Exam     Visual Acuity (ETDRS)       Right Left   Dist cc 20/40 20/40   Dist ph cc 20/25 -1 20/25 -2         Tonometry (Tonopen, 8:22 AM)       Right Left   Pressure 13 10         Pupils       Pupils Dark Light Shape React APD   Right PERRL 5 4 Round Brisk None   Left PERRL 5 4 Round Brisk None         Neuro/Psych     Oriented x3: Yes   Mood/Affect: Normal         Dilation     Both eyes: 1.0% Mydriacyl, 2.5% Phenylephrine @ 8:22 AM           Slit Lamp and Fundus Exam     External Exam       Right Left   External Normal Normal         Slit Lamp Exam       Right Left   Lids/Lashes Normal Normal   Conjunctiva/Sclera White and quiet White and quiet   Cornea Clear Clear   Anterior Chamber Deep and quiet Deep and quiet   Iris Round and reactive Round and reactive   Lens 2+ Nuclear sclerosis 2+  Nuclear sclerosis   Anterior Vitreous  Normal Normal         Fundus Exam       Right Left   Posterior Vitreous Posterior vitreous detachment Normal   Disc Normal Normal   C/D Ratio 0.1 0.1   Macula Normal, no macular thickening, no clinically significant macular edema Normal, no macular thickening, no clinically significant macular edema   Vessels NPDR- Mild NPDR- Mild   Periphery Normal Normal            IMAGING AND PROCEDURES  Imaging and Procedures for 09/24/20  OCT, Retina - OU - Both Eyes       Right Eye Quality was good. Central Foveal Thickness: 254. Progression has been stable. Findings include normal foveal contour.   Left Eye Quality was good. Scan locations included subfoveal. Central Foveal Thickness: 250. Progression has been stable. Findings include normal foveal contour.   Notes Foveal contour OU, some media opacity             ASSESSMENT/PLAN:  OSA on CPAP Compliant on CPAP  Nuclear sclerotic cataract of both eyes Bilateral cataracts, cancer acuity in each eye.  I do recommend cataract evaluationThe nature of cataract was discussed with the patient as well as the elective nature of surgery. The patient was reassured that surgery at a later date does not put the patient at risk for a worse outcome. It was emphasized that the need for surgery is dictated by the patient's quality of life as influenced by the cataract. Patient was instructed to maintain close follow up with their general eye care doctor.  Cataract(s) account for the patient's complaint. I discussed the risks and benefits of cataract surgery. Options were explained to the patient. The patient understands that new glasses may not improve their vision and desires to have cataract surgery. I have recommended follow up with their general eye care doctor for evaluation and consideration of cataract extraction with new intraocular lens insertion.  Mild nonproliferative diabetic retinopathy of  both eyes (HCC) The nature of mild nonproliferative diabetic retinopathy was discussed with the patient. Emphasis was placed on tight glucose, blood pressure, and serum lipid control. Avoidance of smoking was emphasized. Maintenance of normal body weight was emphasized. Appropriate follow up dilated exam is 1 year.      ICD-10-CM   1. Mild nonproliferative diabetic retinopathy of both eyes without macular edema associated with type 2 diabetes mellitus (HCC)  [id number] OCT, Retina - OU - Both Eyes    2. OSA on CPAP  G47.33    Z99.89     3. Nuclear sclerotic cataract of both eyes  H25.13       1.  Mild NPDR OU continues to remain stable.  No active disease  2.  Bilateral cataract progression with decline in best corrected acuity.  Patient needs evaluation and consideration of cataract extraction with intraocular lens placement ,, will refer to [facility] care  3.  Ophthalmic Meds Ordered this visit:  No orders of the defined types were placed in this encounter.      Return in about 1 year (around 09/24/2021) for DILATE OU, OCT.  There are no Patient Instructions on file for this visit.   Explained the diagnoses, plan, and follow up with the patient and they expressed understanding.  Patient expressed understanding of the importance of proper follow up care.   Clent Demark Dhalia Zingaro M.D. Diseases & Surgery of the Retina and Vitreous [facility] [place] 09/24/20     Abbreviations: M myopia (nearsighted);  A astigmatism; H hyperopia (farsighted); P presbyopia; Mrx spectacle prescription;  CTL contact lenses; OD right eye; OS left eye; OU both eyes  XT exotropia; ET esotropia; PEK punctate epithelial keratitis; PEE punctate epithelial erosions; DES dry eye syndrome; MGD meibomian gland dysfunction; ATs artificial tears; PFAT's preservative free artificial tears; [place] nuclear sclerotic cataract; PSC posterior subcapsular cataract; ERM epi-retinal membrane; PVD posterior vitreous  detachment; RD retinal detachment; DM diabetes mellitus; DR diabetic retinopathy; NPDR non-proliferative diabetic retinopathy; PDR proliferative diabetic retinopathy; CSME clinically significant macular edema; DME diabetic macular edema; dbh dot blot hemorrhages; CWS cotton wool spot; POAG primary open angle glaucoma; C/D cup-to-disc ratio; HVF humphrey visual field; GVF goldmann visual field; OCT optical coherence tomography; IOP intraocular pressure; BRVO Branch retinal vein occlusion; CRVO central retinal vein occlusion; CRAO central retinal artery occlusion; BRAO branch retinal artery occlusion; RT retinal tear; SB scleral buckle; PPV pars plana vitrectomy; VH Vitreous hemorrhage; PRP panretinal laser photocoagulation; IVK intravitreal kenalog; VMT vitreomacular traction; MH Macular hole;  NVD neovascularization of the disc; NVE neovascularization elsewhere; AREDS age related eye disease study; ARMD age related macular degeneration; POAG primary open angle glaucoma; EBMD epithelial/anterior basement membrane dystrophy; ACIOL anterior chamber intraocular lens; IOL intraocular lens; PCIOL posterior chamber intraocular lens; Phaco/IOL phacoemulsification with intraocular lens placement; [place] photorefractive keratectomy; LASIK laser assisted in situ keratomileusis; HTN hypertension; DM diabetes mellitus; COPD chronic obstructive pulmonary disease

## 2020-09-24 NOTE — Assessment & Plan Note (Signed)
The nature of mild nonproliferative diabetic retinopathy was discussed with the patient. Emphasis was placed on tight glucose, blood pressure, and serum lipid control. Avoidance of smoking was emphasized. Maintenance of normal body weight was emphasized. Appropriate follow up dilated exam is 1 year. 

## 2020-09-24 NOTE — Assessment & Plan Note (Signed)
Compliant on CPAP

## 2020-09-24 NOTE — Assessment & Plan Note (Signed)
Bilateral cataracts, cancer acuity in each eye.  I do recommend cataract evaluationThe nature of cataract was discussed with the patient as well as the elective nature of surgery. The patient was reassured that surgery at a later date does not put the patient at risk for a worse outcome. It was emphasized that the need for surgery is dictated by the patient's quality of life as influenced by the cataract. Patient was instructed to maintain close follow up with their general eye care doctor.  Cataract(s) account for the patient's complaint. I discussed the risks and benefits of cataract surgery. Options were explained to the patient. The patient understands that new glasses may not improve their vision and desires to have cataract surgery. I have recommended follow up with their general eye care doctor for evaluation and consideration of cataract extraction with new intraocular lens insertion.

## 2020-10-04 ENCOUNTER — Other Ambulatory Visit: Payer: Self-pay | Admitting: Internal Medicine

## 2020-10-05 ENCOUNTER — Other Ambulatory Visit: Payer: Self-pay | Admitting: Internal Medicine

## 2020-11-11 ENCOUNTER — Other Ambulatory Visit: Payer: Self-pay | Admitting: Internal Medicine

## 2020-11-12 ENCOUNTER — Ambulatory Visit: Payer: Medicare HMO | Admitting: Internal Medicine

## 2020-11-19 ENCOUNTER — Encounter (INDEPENDENT_AMBULATORY_CARE_PROVIDER_SITE_OTHER): Payer: Self-pay

## 2020-11-19 LAB — HM DIABETES EYE EXAM

## 2020-11-28 ENCOUNTER — Other Ambulatory Visit: Payer: Self-pay

## 2020-11-28 ENCOUNTER — Encounter: Payer: Self-pay | Admitting: Internal Medicine

## 2020-11-28 ENCOUNTER — Ambulatory Visit (INDEPENDENT_AMBULATORY_CARE_PROVIDER_SITE_OTHER): Payer: Medicare HMO | Admitting: Internal Medicine

## 2020-11-28 VITALS — BP 130/60 | HR 85 | Temp 98.8°F | Ht 61.0 in | Wt 255.4 lb

## 2020-11-28 DIAGNOSIS — E1169 Type 2 diabetes mellitus with other specified complication: Secondary | ICD-10-CM

## 2020-11-28 DIAGNOSIS — E11319 Type 2 diabetes mellitus with unspecified diabetic retinopathy without macular edema: Secondary | ICD-10-CM | POA: Diagnosis not present

## 2020-11-28 DIAGNOSIS — I10 Essential (primary) hypertension: Secondary | ICD-10-CM

## 2020-11-28 DIAGNOSIS — E669 Obesity, unspecified: Secondary | ICD-10-CM

## 2020-11-28 DIAGNOSIS — Z79899 Other long term (current) drug therapy: Secondary | ICD-10-CM | POA: Diagnosis not present

## 2020-11-28 DIAGNOSIS — G4733 Obstructive sleep apnea (adult) (pediatric): Secondary | ICD-10-CM

## 2020-11-28 DIAGNOSIS — Z9989 Dependence on other enabling machines and devices: Secondary | ICD-10-CM

## 2020-11-28 DIAGNOSIS — E039 Hypothyroidism, unspecified: Secondary | ICD-10-CM

## 2020-11-28 LAB — POCT GLYCOSYLATED HEMOGLOBIN (HGB A1C): Hemoglobin A1C: 6.5 % — AB (ref 4.0–5.6)

## 2020-11-28 NOTE — Progress Notes (Signed)
Chief Complaint  Patient presents with   Follow-up     HPI: MADOLINE BHATT 75 y.o. come in for Chronic disease management  Last check 6 2022  DM  has been dpoing ok  recent gum disease  causing dec intake  but ok  Obesity  stable [place]  needs to see  pulm  using nasal maks and may need   new settings BP ok  HLD not on current meds  Cars saw Np may and had lab  amlodipine increased   ? Which cards  last visit dr Einar Gip? ROS: See pertinent positives and negatives per HPI. To get cataract surgery January    To get  podiatry check nails nothing new   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   INFECTION, 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   LIVER FUNCTION TESTS, ABNORMAL 07/28/2008   Morbid obesity ([place]) 12/08/2006   OBESITY 09/24/2009   OBSTRUCTIVE SLEEP APNEA 12/08/2006   OSTEOARTHRITIS 12/08/2006   RUQ PAIN 06/16/2008   SHOULDER PAIN, RIGHT 02/07/2008   Sleep apnea    Swelling of limb 07/28/2008   THYROID FUNCTION TEST, ABNORMAL 12/08/2006    Family History  Problem Relation Age of Onset   Other Mother        blood clots   Cervical cancer Mother    Cancer Mother    Heart disease Brother    Diabetes Brother    Heart disease Sister    Diabetes Sister    Heart disease Brother    Diabetes Brother    Heart disease Brother    Diabetes Brother    Stroke Sister    Diabetes Sister    Throat cancer Father    Cancer Father    Diabetes Other         all siblings, 4 brothers, 5 sisters    Social History   Socioeconomic History   Marital status: Divorced    Spouse name: Not on file   Number of children: Not on file   Years of education: Not on file   Highest education level: Not on file  Occupational History   Occupation: retired    Fish farm manager: RETIRED  Tobacco Use   Smoking status: Never   Smokeless tobacco: Never  Vaping Use   Vaping Use: Never used  Substance and Sexual Activity   Alcohol use: No    Alcohol/week: 0.0 standard drinks   Drug use: No   Sexual activity: Not on file  Other Topics Concern   Not on file  Social History Narrative   Master level education in math   Pt is currently retired   Pt is divorced with children   Recently had to move had a break in and thus away from her pool exercise   Social Determinants of Health   Financial Resource Strain: Low Risk    Difficulty of Paying Living Expenses: Not hard at all  Food Insecurity: No Food Insecurity   Worried About  Running Out of Food in the Last Year: Never true   Ran Out of Food in the Last Year: Never true  Transportation Needs: No Transportation Needs   Lack of Transportation (Medical): No   Lack of Transportation (Non-Medical): No  Physical Activity: Inactive   Days of Exercise per Week: 0 days   Minutes of Exercise per Session: 0 min  Stress: No Stress Concern Present   Feeling of Stress : Not at all  Social Connections: Moderately Integrated   Frequency of Communication with Friends and Family: More than three times a week   Frequency of Social Gatherings with Friends and Family: Once a week   Attends Religious Services: More than 4 times per year   Active Member of [employer] or Organizations: Yes   Attends Archivist Meetings: 1 to 4 times per year   Marital Status: Divorced    Outpatient Medications Prior to Visit  Medication Sig Dispense Refill   amoxicillin (AMOXIL) 500 MG capsule Take 500 mg by mouth 3 (three) times  daily.     chlorthalidone (HYGROTON) 25 MG tablet TAKE 1 TABLET BY MOUTH EVERY DAY 90 tablet 0   cholecalciferol (VITAMIN D) 1000 UNITS tablet Take 1,000 Units by mouth daily.     glucose blood (ACCU-CHEK AVIVA PLUS) test strip Use to check blood sugar daily E11.9 100 each 12   Lancets (ACCU-CHEK SOFT TOUCH) lancets Use to test blood glucose twice daily 50 each 12   levothyroxine (SYNTHROID) 75 MCG tablet TAKE 1 TABLET BY MOUTH EVERY DAY BEFORE BREAKFAST 90 tablet 1   losartan (COZAAR) 100 MG tablet TAKE 1 TABLET (100 MG TOTAL) BY MOUTH DAILY. MAY BEGIN AT 1/2 TABLET DAILY AND INCREASE TO 1 TABLET 90 tablet 1   metoprolol succinate (TOPROL-XL) 25 MG 24 hr tablet TAKE 1 TABLET DAILY. 90 tablet 3   Misc Natural Products (OSTEO BI-FLEX ADV JOINT SHIELD PO) Take 1 tablet by mouth daily.     potassium chloride (KLOR-CON) 10 MEQ tablet TAKE 1 TABLET (10 MEQ TOTAL) BY MOUTH 3 (THREE) TIMES DAILY. 90 tablet 5   amLODipine (NORVASC) 10 MG tablet Take 1 tablet (10 mg total) by mouth daily. 90 tablet 3   No facility-administered medications prior to visit.     EXAM:  BP 130/60 (BP Location: Left Arm, Patient Position: Sitting, Cuff Size: Large)   Pulse 85   Temp 98.8 F (37.1 C) (Oral)   Ht 5\' 1"  (1.549 m)   Wt 255 lb 6.4 oz (115.8 kg)   SpO2 97%   BMI 48.26 kg/m   Body mass index is 48.26 kg/m.  GENERAL: vitals reviewed and listed above, alert, oriented, appears well hydrated and in no acute distress HEENT: atraumatic, conjunctiva  clear, no obvious abnormalities on inspection of external nose and ears OP : no lesion edema or exudate  NECK: no obvious masses on inspection palpation  LUNGS: clear to auscultation bilaterally, no wheezes, rales or rhonchi, good air movement CV: HRRR,2/6 sem  nonradiation no clubbing cyanosis or  peripheral edema nl cap refill  MS: moves all extremities without noticeable focal  abnormality PSYCH: pleasant and cooperative, no obvious depression or anxiety Lab  Results  Component Value Date   WBC 4.9 04/03/2020   HGB 12.1 04/03/2020   HCT 35.5 (L) 04/03/2020   PLT 168.0 04/03/2020   GLUCOSE 113 (H) 06/26/2020   CHOL 136 04/03/2020   TRIG 111.0 04/03/2020   HDL 51.50 04/03/2020   LDLCALC 62 04/03/2020  ALT 11 04/03/2020   AST 11 04/03/2020   NA 140 06/26/2020   K 3.6 06/26/2020   CL 102 06/26/2020   CREATININE 1.18 (H) 06/26/2020   BUN 20 06/26/2020   CO2 22 06/26/2020   TSH 4.07 04/03/2020   HGBA1C 6.5 (A) 11/28/2020   MICROALBUR 1.3 04/03/2020   BP Readings from Last 3 Encounters:  11/28/20 130/60  07/11/20 132/72  06/26/20 140/70   Wt Readings from Last 3 Encounters:  11/28/20 255 lb 6.4 oz (115.8 kg)  07/11/20 258 lb 12.8 oz (117.4 kg)  06/26/20 255 lb 3.2 oz (115.8 kg)     ASSESSMENT AND PLAN:  Discussed the following assessment and plan:  Diabetic retinopathy associated with type 2 diabetes mellitus, macular edema presence unspecified, unspecified laterality, unspecified retinopathy severity ([place])  Diabetes mellitus type 2 in obese ([place]) - stable with eye findings  - Plan: POCT glycosylated hemoglobin (Hb A1C)  Medication management  Essential hypertension  OSA on CPAP  Hypothyroidism, unspecified type No change in meds   ldl at goal and not on meds currently  Follow unless  cards advises  lipid med at this ime  -Patient advised to return or notify health care team  if  new concerns arise. 34 min review visit and  plan  Patient Instructions  Good to see  you today .  See Dr Elsworth Soho the pulmonary  team for the issues with masks and sleep apnea settings.   Continue  attention to healthy weight loss. This helps blood sugar cholesterol and  blood pressure.  Hg a1c 6. 5  very good !.   Plan  fu in 4-6 months or as needed  we can do full lab work at that time.     Standley Brooking. [place] Les[place] M.D.

## 2020-11-28 NOTE — Patient Instructions (Addendum)
Good to see  you today .  See Dr Elsworth Soho the pulmonary  team for the issues with masks and sleep apnea settings.   Continue  attention to healthy weight loss. This helps blood sugar cholesterol and  blood pressure.  Hg a1c 6. 5  very good !.   Plan  fu in 4-6 months or as needed  we can do full lab work at that time.

## 2020-11-29 ENCOUNTER — Encounter: Payer: Self-pay | Admitting: Sports Medicine

## 2020-11-29 ENCOUNTER — Ambulatory Visit: Payer: Medicare HMO | Admitting: Sports Medicine

## 2020-11-29 DIAGNOSIS — M79676 Pain in unspecified toe(s): Secondary | ICD-10-CM | POA: Diagnosis not present

## 2020-11-29 DIAGNOSIS — E119 Type 2 diabetes mellitus without complications: Secondary | ICD-10-CM | POA: Diagnosis not present

## 2020-11-29 DIAGNOSIS — B351 Tinea unguium: Secondary | ICD-10-CM

## 2020-11-29 DIAGNOSIS — I890 Lymphedema, not elsewhere classified: Secondary | ICD-10-CM

## 2020-11-29 NOTE — Progress Notes (Signed)
Subjective: Rebecca Rich is a 75 y.o. female patient with history of diabetes who returns to office today complaining of long,mildly painful nails while ambulating in shoes; unable to trim.  No other issues noted.  FBS not checked. Last A1C 6.3 last PCP yesterday   Patient Active Problem List   Diagnosis Date Noted   Posterior vitreous detachment of right eye 03/26/2020   Posterior vitreous detachment of left eye 03/26/2020   Nuclear sclerotic cataract of both eyes 03/26/2020   Mild nonproliferative diabetic retinopathy of both eyes ([place]) 03/26/2020   Numbness and tingling of left leg 04/05/2018   Influenza vaccination declined by patient 04/05/2018   SVT (supraventricular tachycardia) ([place]) 12/31/2017   Diverticulosis of intestine with bleeding    Rectal bleeding    Hx of gout 08/21/2015   Mild aortic stenosis 08/21/2015   Muscle spasm 03/22/2013   Skin lesion of back 09/13/2012   Left shoulder pain 01/20/2012   Hypothyroid 05/31/2011   Elevated uric acid in blood 05/31/2011   Palmar nodule 03/25/2011   Abnormal urine 02/20/2011   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 90 tablet 3   amoxicillin (AMOXIL) 500 MG capsule Take 500 mg by mouth 3 (three) times daily.     chlorthalidone (HYGROTON) 25 MG tablet TAKE 1 TABLET BY MOUTH EVERY DAY 90 tablet 0   cholecalciferol (VITAMIN D) 1000 UNITS tablet Take 1,000 Units by mouth daily.     glucose blood  (ACCU-CHEK AVIVA PLUS) test strip Use to check blood sugar daily E11.9 100 each 12   Lancets (ACCU-CHEK SOFT TOUCH) lancets Use to test blood glucose twice daily 50 each 12   levothyroxine (SYNTHROID) 75 MCG tablet TAKE 1 TABLET BY MOUTH EVERY DAY BEFORE BREAKFAST 90 tablet 1   losartan (COZAAR) 100 MG tablet TAKE 1 TABLET (100 MG TOTAL) BY MOUTH DAILY. MAY BEGIN AT 1/2 TABLET DAILY AND INCREASE TO 1 TABLET 90 tablet 1   metoprolol succinate (TOPROL-XL) 25 MG 24 hr tablet TAKE 1 TABLET DAILY. 90 tablet 3   Misc Natural Products (OSTEO BI-FLEX ADV JOINT SHIELD PO) Take 1 tablet by mouth daily.     potassium chloride (KLOR-CON) 10 MEQ tablet TAKE 1 TABLET (10 MEQ TOTAL) BY MOUTH 3 (THREE) TIMES DAILY. 90 tablet 5   No current facility-administered medications on file prior to visit.   No Known Allergies  Recent Results (from the past 2160 hour(s))  HM DIABETES EYE EXAM     Status: Abnormal   Collection Time: 11/19/20 12:00 AM  Result Value Ref Range   HM Diabetic Eye Exam Retinopathy (A) No Retinopathy  POCT glycosylated hemoglobin (Hb A1C)     Status: Abnormal   Collection Time: 11/28/20  3:49 PM  Result Value Ref Range   Hemoglobin A1C 6.5 (A) 4.0 - 5.6 %   HbA1c POC (<> result, manual entry)     HbA1c, POC (prediabetic range)     HbA1c, POC (controlled diabetic range)  Objective: General: Patient is awake, alert, and oriented x 3 and in no acute distress.  Integument: Skin is warm, dry, and supple bilateral. Nails are tender, long, thickened and dystrophic with subungual debris, consistent with onychomycosis, 1-5 bilateral.  No signs of infection.   Vasculature:  Dorsalis Pedis pulse 1/4 bilateral. Posterior Tibial pulse 0/4 bilateral, chronic edema today to ankles. Capillary fill time <3 sec 1-5 bilateral. +hyperpigmentation, Positive hair growth to the level of the digits.Temperature gradient within normal limits.   Neurology: Vibratory sensation intact bilateral.    Musculoskeletal:Asymptomatic pes pedal deformities noted bilateral. Muscular strength 5/5 in all lower extremity muscular groups bilateral without pain on range of motion.   Assessment and Plan: Problem List Items Addressed This Visit   None Visit Diagnoses     Pain due to onychomycosis of nail    -  Primary   Diabetes mellitus without complication (HCC)       Lymphedema          -Examined patient. -Re-Discussed and educated patient on diabetic foot care, especially with regards to the vascular, neurological and musculoskeletal systems.  -Mechanically debrided all nails 1-5 bilateral using sterile nail nipper and filed with dremel without incident  -Answered all patient questions -Patient to return  in 3 months for at risk foot care.  Landis Martins, DPM

## 2021-01-04 ENCOUNTER — Other Ambulatory Visit: Payer: Self-pay | Admitting: Internal Medicine

## 2021-01-18 ENCOUNTER — Other Ambulatory Visit: Payer: Self-pay | Admitting: Internal Medicine

## 2021-02-04 ENCOUNTER — Encounter (INDEPENDENT_AMBULATORY_CARE_PROVIDER_SITE_OTHER): Payer: Self-pay

## 2021-02-04 ENCOUNTER — Encounter: Payer: Self-pay | Admitting: Internal Medicine

## 2021-02-04 LAB — HM DIABETES EYE EXAM

## 2021-02-08 ENCOUNTER — Encounter: Payer: Self-pay | Admitting: Internal Medicine

## 2021-03-07 ENCOUNTER — Ambulatory Visit (INDEPENDENT_AMBULATORY_CARE_PROVIDER_SITE_OTHER): Payer: Medicare HMO | Admitting: Sports Medicine

## 2021-03-07 ENCOUNTER — Encounter: Payer: Self-pay | Admitting: Sports Medicine

## 2021-03-07 ENCOUNTER — Other Ambulatory Visit: Payer: Self-pay

## 2021-03-07 DIAGNOSIS — B351 Tinea unguium: Secondary | ICD-10-CM | POA: Diagnosis not present

## 2021-03-07 DIAGNOSIS — I890 Lymphedema, not elsewhere classified: Secondary | ICD-10-CM

## 2021-03-07 DIAGNOSIS — M79676 Pain in unspecified toe(s): Secondary | ICD-10-CM | POA: Diagnosis not present

## 2021-03-07 DIAGNOSIS — M79609 Pain in unspecified limb: Secondary | ICD-10-CM

## 2021-03-07 DIAGNOSIS — E119 Type 2 diabetes mellitus without complications: Secondary | ICD-10-CM

## 2021-03-07 NOTE — Progress Notes (Signed)
Subjective: Rebecca Rich is a 77 y.o. female patient with history of diabetes who returns to office today complaining of long,mildly painful nails while ambulating in shoes; unable to trim.  Reports that she is doing good working hard at trying to lose weight.  No other issues noted.  FBS 133. Last A1C 6.3 last PCP Panosh, Standley Brooking, MD was Nov. 2022.   Patient Active Problem List   Diagnosis Date Noted   Posterior vitreous detachment of right eye 03/26/2020   Posterior vitreous detachment of left eye 03/26/2020   Nuclear sclerotic cataract of both eyes 03/26/2020   Mild nonproliferative diabetic retinopathy of both eyes ([place]) 03/26/2020   Numbness and tingling of left leg 04/05/2018   Influenza vaccination declined by patient 04/05/2018   SVT (supraventricular tachycardia) ([place]) 12/31/2017   Diverticulosis of intestine with bleeding    Rectal bleeding    Hx of gout 08/21/2015   Mild aortic stenosis 08/21/2015   Muscle spasm 03/22/2013   Skin lesion of back 09/13/2012   Left shoulder pain 01/20/2012   Hypothyroid 05/31/2011   Elevated uric acid in blood 05/31/2011   Palmar nodule 03/25/2011   Abnormal urine 02/20/2011   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   SHOULDER PAIN, RIGHT 02/07/2008   Diabetes mellitus type 2 in obese ([place]) 12/08/2006   HYPERLIPIDEMIA 12/08/2006   Morbid obesity with BMI of 50.0-59.9, adult ([place]) 12/08/2006   OSA on CPAP 12/08/2006   Essential hypertension 12/08/2006   OSTEOARTHRITIS 12/08/2006   THYROID FUNCTION TEST, ABNORMAL 12/08/2006   COLONIC POLYPS, HX OF 12/08/2006   Current Outpatient Medications on File Prior to Visit  Medication Sig Dispense Refill   amLODipine (NORVASC) 10 MG tablet Take 1 tablet (10 mg total) by mouth daily. 90 tablet 3   amoxicillin (AMOXIL) 500 MG capsule Take 500 mg by mouth 3 (three) times daily.     chlorthalidone (HYGROTON) 25 MG tablet TAKE 1 TABLET BY MOUTH EVERY DAY 90 tablet 0   cholecalciferol  (VITAMIN D) 1000 UNITS tablet Take 1,000 Units by mouth daily.     glucose blood (ACCU-CHEK AVIVA PLUS) test strip Use to check blood sugar daily E11.9 100 each 12   Lancets (ACCU-CHEK SOFT TOUCH) lancets Use to test blood glucose twice daily 50 each 12   levothyroxine (SYNTHROID) 75 MCG tablet TAKE 1 TABLET BY MOUTH EVERY DAY BEFORE BREAKFAST 90 tablet 1   losartan (COZAAR) 100 MG tablet TAKE 1 TABLET (100 MG TOTAL) BY MOUTH DAILY. MAY BEGIN AT 1/2 TABLET DAILY AND INCREASE TO 1 TABLET 90 tablet 1   metoprolol succinate (TOPROL-XL) 25 MG 24 hr tablet TAKE 1 TABLET DAILY. 90 tablet 3   Misc Natural Products (OSTEO BI-FLEX ADV JOINT SHIELD PO) Take 1 tablet by mouth daily.     potassium chloride (KLOR-CON) 10 MEQ tablet TAKE 1 TABLET (10 MEQ TOTAL) BY MOUTH 3 (THREE) TIMES DAILY. 90 tablet 5   No current facility-administered medications on file prior to visit.   No Known Allergies  No results found for this or any previous visit (from the past 2160 hour(s)).   Objective: General: Patient is awake, alert, and oriented x 3 and in no acute distress.  Integument: Skin is warm, dry, and supple bilateral. Nails are tender, long, thickened and dystrophic with subungual debris, consistent with onychomycosis, 1-5 bilateral.  No signs of infection.   Vasculature:  Dorsalis Pedis pulse 1/4 bilateral. Posterior Tibial pulse 0/4 bilateral, chronic edema today to ankles, unchanged from  prior. Capillary fill time <3 sec 1-5 bilateral. +hyperpigmentation, Positive hair growth to the level of the digits.Temperature gradient within normal limits.   Neurology: Vibratory sensation intact bilateral.   Musculoskeletal:Asymptomatic pes pedal deformities noted bilateral. Muscular strength 5/5 in all lower extremity muscular groups bilateral without pain on range of motion.   Assessment and Plan: Problem List Items Addressed This Visit   None Visit Diagnoses     Pain due to onychomycosis of nail    -  Primary    Diabetes mellitus without complication (HCC)       Lymphedema          -Examined patient. -Re-Discussed and educated patient on diabetic foot care, especially with regards to the vascular, neurological and musculoskeletal systems.  -Mechanically debrided all painful nails 1-5 bilateral using sterile nail nipper and filed with dremel without incident  -Answered all patient questions -Patient to return  in 3 months for at risk foot care.  Landis Martins, DPM

## 2021-03-13 ENCOUNTER — Ambulatory Visit: Payer: Medicare HMO

## 2021-03-14 ENCOUNTER — Other Ambulatory Visit: Payer: Self-pay | Admitting: Internal Medicine

## 2021-03-21 ENCOUNTER — Ambulatory Visit (INDEPENDENT_AMBULATORY_CARE_PROVIDER_SITE_OTHER): Payer: Medicare HMO

## 2021-03-21 VITALS — Ht 61.0 in | Wt 260.0 lb

## 2021-03-21 DIAGNOSIS — Z0000 Encounter for general adult medical examination without abnormal findings: Secondary | ICD-10-CM | POA: Diagnosis not present

## 2021-03-21 NOTE — Progress Notes (Signed)
Subjective:   Rebecca Rich is a 76 y.o. female who presents for Medicare Annual (Subsequent) preventive examination.  Review of Systems    Virtual Visit via Telephone Note  I connected with  Rebecca Rich on 03/21/21 at 11:15 AM EST by telephone and verified that I am speaking with the correct person using two identifiers.  Location: Patient: Home Provider: Office Persons participating in the virtual visit: patient/Nurse Health Advisor   I discussed the limitations, risks, security and privacy concerns of performing an evaluation and management service by telephone and the availability of in person appointments. The patient expressed understanding and agreed to proceed.  Interactive audio and video telecommunications were attempted between this nurse and patient, however failed, due to patient having technical difficulties OR patient did not have access to video capability.  We continued and completed visit with audio only.  Some vital signs may be absent or patient reported.   Criselda Peaches, LPN  Cardiac Risk Factors include: advanced age (>16men, >48 women);diabetes mellitus;hypertension     Objective:    Today's Vitals   03/21/21 1111  Weight: 260 lb (117.9 kg)  Height: 5\' 1"  (1.549 m)   Body mass index is 49.13 kg/m.  Advanced Directives 03/21/2021 03/07/2020 06/29/2018 12/30/2017 12/30/2017 02/17/2017 02/15/2017  Does Patient Have a Medical Advance Directive? Yes No Yes No No Yes Yes  Type of Paramedic of [place];Living will - [place] will Living will  Does patient want to make changes to medical advance directive? No - Patient declined - No - Patient declined - - No - Patient declined No - Patient declined  Copy of [place] in Chart? No - copy requested - No - copy requested - - - -  Would patient like information on creating a medical advance directive? - Yes (MAU/Ambulatory/Procedural Areas -  Information given) No - Patient declined No - Patient declined - No - Patient declined No - Patient declined    Current Medications (verified) Outpatient Encounter Medications as of 03/21/2021  Medication Sig   amLODipine (NORVASC) 10 MG tablet Take 1 tablet (10 mg total) by mouth daily.   chlorthalidone (HYGROTON) 25 MG tablet TAKE 1 TABLET BY MOUTH EVERY DAY   cholecalciferol (VITAMIN D) 1000 UNITS tablet Take 1,000 Units by mouth daily.   glucose blood (ACCU-CHEK AVIVA PLUS) test strip Use to check blood sugar daily E11.9   Lancets (ACCU-CHEK SOFT TOUCH) lancets Use to test blood glucose twice daily   levothyroxine (SYNTHROID) 75 MCG tablet TAKE 1 TABLET BY MOUTH EVERY DAY BEFORE BREAKFAST   losartan (COZAAR) 100 MG tablet TAKE 1 TABLET (100 MG TOTAL) BY MOUTH DAILY. MAY BEGIN AT 1/2 TABLET DAILY AND INCREASE TO 1 TABLET   metoprolol succinate (TOPROL-XL) 25 MG 24 hr tablet TAKE 1 TABLET DAILY.   Misc Natural Products (OSTEO BI-FLEX ADV JOINT SHIELD PO) Take 1 tablet by mouth daily.   potassium chloride (KLOR-CON) 10 MEQ tablet TAKE 1 TABLET (10 MEQ TOTAL) BY MOUTH 3 (THREE) TIMES DAILY.   [DISCONTINUED] amoxicillin (AMOXIL) 500 MG capsule Take 500 mg by mouth 3 (three) times daily.   No facility-administered encounter medications on file as of 03/21/2021.    Allergies (verified) Patient has no known allergies.   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    INFECTION, 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   LIVER FUNCTION TESTS, ABNORMAL 07/28/2008   Morbid obesity ([place]) 12/08/2006   OBESITY 09/24/2009   OBSTRUCTIVE SLEEP APNEA 12/08/2006   OSTEOARTHRITIS 12/08/2006   RUQ PAIN 06/16/2008   SHOULDER PAIN, RIGHT 02/07/2008   Sleep apnea    Swelling of limb 07/28/2008   THYROID FUNCTION TEST, ABNORMAL 12/08/2006   Past Surgical History:  Procedure Laterality Date   ABDOMINAL HYSTERECTOMY     CHOLECYSTECTOMY  06/27/2008   COLONOSCOPY N/A 12/13/2012   Procedure: COLONOSCOPY;  Surgeon: Juanita Craver, MD;  Location: [facility];  Service: Endoscopy;  Laterality: N/A;   COLONOSCOPY Left 02/17/2017   Procedure: COLONOSCOPY;  Surgeon: Carol Ada, MD;  Location: [place];  Service: Endoscopy;  Laterality: Left;   common duct stone     ERCP    EYE SURGERY     LAPAROSCOPIC APPENDECTOMY N/A 12/30/2017   Procedure: APPENDECTOMY LAPAROSCOPIC;  Surgeon: Stark Klein, MD;  Location: [facility];  Service: General;  Laterality: N/A;   Family History  Problem Relation Age of Onset   Other Mother        blood clots   Cervical cancer Mother    Cancer Mother    Heart disease Brother    Diabetes Brother    Heart disease Sister    Diabetes Sister    Heart disease Brother    Diabetes Brother    Heart disease Brother    Diabetes Brother    Stroke Sister    Diabetes Sister    Throat cancer Father    Cancer Father    Diabetes Other        all siblings, 4 brothers, 63 sisters   Social History   Socioeconomic History   Marital status: Divorced    Spouse name: Not on file   Number of children: Not on file   Years of education: Not on file   Highest education level: Not on file  Occupational History   Occupation: retired    Fish farm manager: RETIRED  Tobacco Use   Smoking status: Never   Smokeless tobacco: Never  Vaping Use   Vaping Use: Never used  Substance and Sexual Activity   Alcohol use: No    Alcohol/week: 0.0 standard drinks   Drug  use: No   Sexual activity: Not on file  Other Topics Concern   Not on file  Social History Narrative   Master level education in math   Pt is currently retired   Pt is divorced with children   Recently had to move had a break in and thus away from her pool exercise   Social Determinants of Health   Financial Resource Strain: Low Risk    Difficulty of Paying Living Expenses: Not hard at all  Food Insecurity: No Food Insecurity   Worried About Charity fundraiser in the Last Year: Never true   Arboriculturist in the Last Year: Never true  Transportation Needs: No Transportation Needs   Lack of Transportation (Medical): No   Lack of Transportation (Non-Medical): No  Physical Activity: Insufficiently Active  Days of Exercise per Week: 2 days   Minutes of Exercise per Session: 30 min  Stress: No Stress Concern Present   Feeling of Stress : Not at all  Social Connections: Moderately Integrated   Frequency of Communication with Friends and Family: More than three times a week   Frequency of Social Gatherings with Friends and Family: More than three times a week   Attends Religious Services: More than 4 times per year   Active Member of [employer] or Organizations: Yes   Attends Music therapist: More than 4 times per year   Marital Status: Divorced    Tobacco Counseling Counseling given: Not Answered   Clinical Intake:  Pre-visit preparation completed: Yes  Pain : No/denies pain     BMI - recorded: 48.28 Nutritional Status: BMI > 30  Obese Nutritional Risks: None Diabetes: Yes CBG done?: No (Audio Visit) Did pt. bring in CBG monitor from home?: No  How often do you need to have someone help you when you read instructions, pamphlets, or other written materials from your doctor or pharmacy?: 1 - Never  Diabetic? Yes  Interpreter Needed?: NoNutrition Risk Assessment:  Has the patient had any N/V/D within the last 2 months?  No  Does the patient have any  non-healing wounds?  No  Has the patient had any unintentional weight loss or weight gain?  No   Diabetes:  Is the patient diabetic?  Yes  If diabetic, was a CBG obtained today?  No  Did the patient bring in their glucometer from home?  No  Audio Audio Visit How often do you monitor your CBG's? 2x weekly.   Financial Strains and Diabetes Management:  Are you having any financial strains with the device, your supplies or your medication? No .  Does the patient want to be seen by Chronic Care Management for management of their diabetes?  No  Would the patient like to be referred to a Nutritionist or for Diabetic Management?  No   Diabetic Exams:  Diabetic Eye Exam: Completed Yes. Overdue for diabetic eye exam. Pt has been advised about the importance in completing this exam. A referral has been placed today. Message sent to referral coordinator for scheduling purposes. Advised pt to expect a call from office referred to regarding appt.  Diabetic Foot Exam: Completed Yes. Pt has been advised about the importance in completing this exam. Pt is scheduled for diabetic foot exam on Followed by.     Activities of Daily Living In your present state of health, do you have any difficulty performing the following activities: 03/21/2021  Hearing? N  Vision? N  Difficulty concentrating or making decisions? N  Walking or climbing stairs? N  Doing errands, shopping? N  Preparing Food and eating ? N  Using the Toilet? N  In the past six months, have you accidently leaked urine? Y  Comment Wears pads  Do you have problems with loss of bowel control? N  Managing your Medications? N  Managing your Finances? N  Housekeeping or managing your Housekeeping? N  Some recent data might be hidden    Patient Care Team: Panosh, Standley Brooking, MD as PCP - General Fay Records, MD as PCP - Cardiology (Cardiology) Alphonsa Overall, MD (General Surgery) Carol Ada, MD (Gastroenterology) Gean Birchwood, DPM  (Podiatry) Adrian Prows, MD as Consulting Physician (Cardiology) Juanita Craver, MD as Consulting Physician (Gastroenterology) Zadie Rhine Clent Demark, MD as Consulting Physician (Ophthalmology)  Indicate any recent Medical Services you  may have received from other than [facility] providers in the past year (date may be approximate).     Assessment:   This is a routine wellness examination for Rebecca Rich.  Hearing/Vision screen Hearing Screening - Comments:: No difficulty hearing Vision Screening - Comments:: Wears glasses. Followed by Dr Herbert Deaner  Dietary issues and exercise activities discussed: Exercise limited by: None identified   Goals Addressed               This Visit's Progress     Patient Stated (pt-stated)        Lose 10 lbs every 3 months this years.        Depression Screen PHQ 2/9 Scores 03/21/2021 03/07/2020 12/05/2019 04/05/2018 02/24/2018 11/04/2016 04/20/2015  PHQ - 2 Score 0 0 0 0 0 0 0    Fall Risk Fall Risk  03/21/2021 03/18/2021 03/07/2020 12/05/2019 04/05/2018  Falls in the past year? 0 0 1 0 0  Comment - - - - -  Number falls in past yr: 0 - 1 0 0  Injury with Fall? 0 - 1 0 0  Comment - - bruises - -  Risk for fall due to : No Fall Risks - Impaired vision;Impaired balance/gait;History of fall(s) - -  Follow up - - Falls prevention discussed - Falls evaluation completed    FALL RISK PREVENTION PERTAINING TO THE HOME:  Any stairs in or around the home? No  If so, are there any without handrails? No  Home free of loose throw rugs in walkways, pet beds, electrical cords, etc? Yes  Adequate lighting in your home to reduce risk of falls? Yes   ASSISTIVE DEVICES UTILIZED TO PREVENT FALLS:  Life alert? Yes  Use of a cane, walker or w/c? No  Grab bars in the bathroom? Yes Shower chair or bench in shower? Yes  Elevated toilet seat or a handicapped toilet? Yes   TIMED UP AND GO:  Was the test performed? No . Audio Visit  Cognitive Function:     6CIT Screen 03/21/2021 03/07/2020   What Year? 0 points 0 points  What month? 0 points 0 points  What time? 0 points -  Count back from 20 0 points 0 points  Months in reverse 0 points 0 points  Repeat phrase 0 points 0 points  Total Score 0 -    Immunizations Immunization History  Administered Date(s) Administered   PFIZER(Purple Top)SARS-COV-2 Vaccination 10/31/2020, 11/15/2020   Pneumococcal Conjugate-13 11/23/2015   Pneumococcal Polysaccharide-23 05/24/2012   Td 01/28/2003   Tdap 12/05/2019    TDAP status: Up to date  Flu Vaccine status: Declined, Education has been provided regarding the importance of this vaccine but patient still declined. Advised may receive this vaccine at local pharmacy or Health Dept. Aware to provide a copy of the vaccination record if obtained from local pharmacy or Health Dept. Verbalized acceptance and understanding.  Pneumococcal vaccine status: Up to date  Covid-19 vaccine status: Completed vaccines  Qualifies for Shingles Vaccine? Yes   Zostavax completed No   Shingrix Completed?: No.    Education has been provided regarding the importance of this vaccine. Patient has been advised to call insurance company to determine out of pocket expense if they have not yet received this vaccine. Advised may also receive vaccine at local pharmacy or Health Dept. Verbalized acceptance and understanding.  Screening Tests Health Maintenance  Topic Date Due   DEXA SCAN  Never done   FOOT EXAM  03/31/2020   Zoster Vaccines- Shingrix (  1 of 2) 06/18/2021 (Originally 04/13/1964)   HEMOGLOBIN A1C  05/28/2021   OPHTHALMOLOGY EXAM  02/04/2022   TETANUS/TDAP  12/04/2029   Pneumonia Vaccine 73+ Years old  Completed   Hepatitis C Screening  Completed   HPV VACCINES  Aged Out   INFLUENZA VACCINE  Discontinued   COLONOSCOPY (Pts 45-47yrs Insurance coverage will need to be confirmed)  Discontinued   COVID-19 Vaccine  Discontinued    Health Maintenance  Health Maintenance Due  Topic Date Due    DEXA SCAN  Never done   FOOT EXAM  03/31/2020    Colorectal cancer screening: No longer required.   Mammogram status: No longer required due to Age.  Dexa Scan: Patient deferred  Lung Cancer Screening: (Low Dose CT Chest recommended if Age 56-80 years, 30 pack-year currently smoking OR have quit w/in 15years.) does not qualify.     Additional Screening:  Hepatitis C Screening: does qualify; Completed 02/26/10  Vision Screening: Recommended annual ophthalmology exams for early detection of glaucoma and other disorders of the eye. Is the patient up to date with their annual eye exam?  Yes  Who is the provider or what is the name of the office in which the patient attends annual eye exams? Dr Herbert Deaner If pt is not established with a provider, would they like to be referred to a provider to establish care? No .   Dental Screening: Recommended annual dental exams for proper oral hygiene  Community Resource Referral / Chronic Care Management:  CRR required this visit?  No   CCM required this visit?  No      Plan:     I have personally reviewed and noted the following in the patients chart:   Medical and social history Use of alcohol, tobacco or illicit drugs  Current medications and supplements including opioid prescriptions.  Functional ability and status Nutritional status Physical activity Advanced directives List of other physicians Hospitalizations, surgeries, and ER visits in previous 12 months Vitals Screenings to include cognitive, depression, and falls Referrals and appointments  In addition, I have reviewed and discussed with patient certain preventive protocols, quality metrics, and best practice recommendations. A written personalized care plan for preventive services as well as general preventive health recommendations were provided to patient.     Criselda Peaches, LPN   [id number]   Nurse Notes: None

## 2021-03-21 NOTE — Patient Instructions (Addendum)
Rebecca Rich , Thank you for taking time to come for your Medicare Wellness Visit. I appreciate your ongoing commitment to your health goals. Please review the following plan we discussed and let me know if I can assist you in the future.   These are the goals we discussed:  Goals       Patient Stated (pt-stated)      Lose 10 lbs every 3 months this years.         This is a list of the screening recommended for you and due dates:  Health Maintenance  Topic Date Due   DEXA scan (bone density measurement)  Never done   Complete foot exam   03/31/2020   Zoster (Shingles) Vaccine (1 of 2) 06/18/2021*   Hemoglobin A1C  05/28/2021   Eye exam for diabetics  02/04/2022   Tetanus Vaccine  12/04/2029   Pneumonia Vaccine  Completed   Hepatitis C Screening: USPSTF Recommendation to screen - Ages 73-79 yo.  Completed   HPV Vaccine  Aged Out   Flu Shot  Discontinued   Colon Cancer Screening  Discontinued   COVID-19 Vaccine  Discontinued  *Topic was postponed. The date shown is not the original due date.    Advanced directives: Yes Patient will bring copy  Conditions/risks identified: None  Next appointment: Follow up in one year for your annual wellness visit    Preventive Care 65 Years and Older, Female Preventive care refers to lifestyle choices and visits with your health care provider that can promote health and wellness. What does preventive care include? A yearly physical exam. This is also called an annual well check. Dental exams once or twice a year. Routine eye exams. Ask your health care provider how often you should have your eyes checked. Personal lifestyle choices, including: Daily care of your teeth and gums. Regular physical activity. Eating a healthy diet. Avoiding tobacco and drug use. Limiting alcohol use. Practicing safe sex. Taking low-dose aspirin every day. Taking vitamin and mineral supplements as recommended by your health care provider. What happens  during an annual well check? The services and screenings done by your health care provider during your annual well check will depend on your age, overall health, lifestyle risk factors, and family history of disease. Counseling  Your health care provider may ask you questions about your: Alcohol use. Tobacco use. Drug use. Emotional well-being. Home and relationship well-being. Sexual activity. Eating habits. History of falls. Memory and ability to understand (cognition). Work and work Statistician. Reproductive health. Screening  You may have the following tests or measurements: Height, weight, and BMI. Blood pressure. Lipid and cholesterol levels. These may be checked every 5 years, or more frequently if you are over 80 years old. Skin check. Lung cancer screening. You may have this screening every year starting at age 16 if you have a 30-pack-year history of smoking and currently smoke or have quit within the past 15 years. Fecal occult blood test (FOBT) of the stool. You may have this test every year starting at age 54. Flexible sigmoidoscopy or colonoscopy. You may have a sigmoidoscopy every 5 years or a colonoscopy every 10 years starting at age 21. Hepatitis C blood test. Hepatitis B blood test. Sexually transmitted disease (STD) testing. Diabetes screening. This is done by checking your blood sugar (glucose) after you have not eaten for a while (fasting). You may have this done every 1-3 years. Bone density scan. This is done to screen for osteoporosis. You may have  this done starting at age 27. Mammogram. This may be done every 1-2 years. Talk to your health care provider about how often you should have regular mammograms. Talk with your health care provider about your test results, treatment options, and if necessary, the need for more tests. Vaccines  Your health care provider may recommend certain vaccines, such as: Influenza vaccine. This is recommended every  year. Tetanus, diphtheria, and acellular pertussis (Tdap, Td) vaccine. You may need a Td booster every 10 years. Zoster vaccine. You may need this after age 31. Pneumococcal 13-valent conjugate (PCV13) vaccine. One dose is recommended after age 76. Pneumococcal polysaccharide (PPSV23) vaccine. One dose is recommended after age 76. Talk to your health care provider about which screenings and vaccines you need and how often you need them. This information is not intended to replace advice given to you by your health care provider. Make sure you discuss any questions you have with your health care provider. Document Released: 02/09/2015 Document Revised: 10/03/2015 Document Reviewed: 11/14/2014 Elsevier Interactive Patient Education  2017 [place] Prevention in the Home Falls can cause injuries. They can happen to people of all ages. There are many things you can do to make your home safe and to help prevent falls. What can I do on the outside of my home? Regularly fix the edges of walkways and driveways and fix any cracks. Remove anything that might make you trip as you walk through a door, such as a raised step or threshold. Trim any bushes or trees on the path to your home. Use bright outdoor lighting. Clear any walking paths of anything that might make someone trip, such as rocks or tools. Regularly check to see if handrails are loose or broken. Make sure that both sides of any steps have handrails. Any raised decks and porches should have guardrails on the edges. Have any leaves, snow, or ice cleared regularly. Use sand or salt on walking paths during winter. Clean up any spills in your garage right away. This includes oil or grease spills. What can I do in the bathroom? Use night lights. Install grab bars by the toilet and in the tub and shower. Do not use towel bars as grab bars. Use non-skid mats or decals in the tub or shower. If you need to sit down in the shower, use a  plastic, non-slip stool. Keep the floor dry. Clean up any water that spills on the floor as soon as it happens. Remove soap buildup in the tub or shower regularly. Attach bath mats securely with double-sided non-slip rug tape. Do not have throw rugs and other things on the floor that can make you trip. What can I do in the bedroom? Use night lights. Make sure that you have a light by your bed that is easy to reach. Do not use any sheets or blankets that are too big for your bed. They should not hang down onto the floor. Have a firm chair that has side arms. You can use this for support while you get dressed. Do not have throw rugs and other things on the floor that can make you trip. What can I do in the kitchen? Clean up any spills right away. Avoid walking on wet floors. Keep items that you use a lot in easy-to-reach places. If you need to reach something above you, use a strong step stool that has a grab bar. Keep electrical cords out of the way. Do not use floor polish or  wax that makes floors slippery. If you must use wax, use non-skid floor wax. Do not have throw rugs and other things on the floor that can make you trip. What can I do with my stairs? Do not leave any items on the stairs. Make sure that there are handrails on both sides of the stairs and use them. Fix handrails that are broken or loose. Make sure that handrails are as long as the stairways. Check any carpeting to make sure that it is firmly attached to the stairs. Fix any carpet that is loose or worn. Avoid having throw rugs at the top or bottom of the stairs. If you do have throw rugs, attach them to the floor with carpet tape. Make sure that you have a light switch at the top of the stairs and the bottom of the stairs. If you do not have them, ask someone to add them for you. What else can I do to help prevent falls? Wear shoes that: Do not have high heels. Have rubber bottoms. Are comfortable and fit you  well. Are closed at the toe. Do not wear sandals. If you use a stepladder: Make sure that it is fully opened. Do not climb a closed stepladder. Make sure that both sides of the stepladder are locked into place. Ask someone to hold it for you, if possible. Clearly mark and make sure that you can see: Any grab bars or handrails. First and last steps. Where the edge of each step is. Use tools that help you move around (mobility aids) if they are needed. These include: Canes. Walkers. Scooters. Crutches. Turn on the lights when you go into a dark area. Replace any light bulbs as soon as they burn out. Set up your furniture so you have a clear path. Avoid moving your furniture around. If any of your floors are uneven, fix them. If there are any pets around you, be aware of where they are. Review your medicines with your doctor. Some medicines can make you feel dizzy. This can increase your chance of falling. Ask your doctor what other things that you can do to help prevent falls. This information is not intended to replace advice given to you by your health care provider. Make sure you discuss any questions you have with your health care provider. Document Released: 11/09/2008 Document Revised: 06/21/2015 Document Reviewed: 02/17/2014 Elsevier Interactive Patient Education  2017 [employer].

## 2021-05-15 ENCOUNTER — Encounter (INDEPENDENT_AMBULATORY_CARE_PROVIDER_SITE_OTHER): Payer: Self-pay

## 2021-05-23 ENCOUNTER — Encounter: Payer: Self-pay | Admitting: Internal Medicine

## 2021-05-28 ENCOUNTER — Ambulatory Visit: Payer: Self-pay | Admitting: Internal Medicine

## 2021-05-28 NOTE — Progress Notes (Deleted)
No chief complaint on file.   HPI: Rebecca Rich 76 y.o. come in for Chronic disease management  Last visit 11 22    Last a 1c is at goal  Due for full lab panel .,  Has resisted  statin med  Dr Elsworth Soho sleep apnea  ROS: See pertinent positives and negatives per HPI.  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   INFECTION, 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   LIVER FUNCTION TESTS, ABNORMAL 07/28/2008   Morbid obesity ([place]) 12/08/2006   OBESITY 09/24/2009   OBSTRUCTIVE SLEEP APNEA 12/08/2006   OSTEOARTHRITIS 12/08/2006   RUQ PAIN 06/16/2008   SHOULDER PAIN, RIGHT 02/07/2008   Sleep apnea    Swelling of limb 07/28/2008   THYROID FUNCTION TEST, ABNORMAL 12/08/2006    Family History  Problem Relation Age of Onset   Other Mother        blood clots   Cervical cancer Mother    Cancer Mother    Heart disease Brother    Diabetes Brother    Heart disease Sister    Diabetes Sister    Heart disease Brother    Diabetes Brother    Heart disease Brother    Diabetes Brother    Stroke Sister    Diabetes Sister    Throat cancer Father    Cancer Father    Diabetes Other        all siblings, 4 brothers, 5 sisters    Social History   Socioeconomic History   Marital status: Divorced    Spouse name: Not on file   Number of children: Not on file   Years of education: Not on file   Highest education level: Not on file  Occupational History    Occupation: retired    Fish farm manager: RETIRED  Tobacco Use   Smoking status: Never   Smokeless tobacco: Never  Vaping Use   Vaping Use: Never used  Substance and Sexual Activity   Alcohol use: No    Alcohol/week: 0.0 standard drinks   Drug use: No   Sexual activity: Not on file  Other Topics Concern   Not on file  Social History Narrative   Master level education in math   Pt is currently retired   Pt is divorced with children   Recently had to move had a break in and thus away from her pool exercise   Social Determinants of Health   Financial Resource Strain: Low Risk    Difficulty of Paying Living Expenses: Not hard at all  Food Insecurity: No Food Insecurity   Worried About Charity fundraiser in the Last Year: Never true   Arboriculturist in the Last Year: Never true  Transportation Needs: No Transportation Needs   Lack of Transportation (Medical): No   Lack of Transportation (Non-Medical): No  Physical Activity: Insufficiently Active   Days of Exercise per Week: 2 days   Minutes of Exercise per Session: 30  min  Stress: No Stress Concern Present   Feeling of Stress : Not at all  Social Connections: Moderately Integrated   Frequency of Communication with Friends and Family: More than three times a week   Frequency of Social Gatherings with Friends and Family: More than three times a week   Attends Religious Services: More than 4 times per year   Active Member of Clubs or Organizations: Yes   Attends Music therapist: More than 4 times per year   Marital Status: Divorced    Outpatient Medications Prior to Visit  Medication Sig Dispense Refill   amLODipine (NORVASC) 10 MG tablet Take 1 tablet (10 mg total) by mouth daily. 90 tablet 3   chlorthalidone (HYGROTON) 25 MG tablet TAKE 1 TABLET BY MOUTH EVERY DAY 90 tablet 0   cholecalciferol (VITAMIN D) 1000 UNITS tablet Take 1,000 Units by mouth daily.     glucose blood (ACCU-CHEK AVIVA PLUS) test strip Use to  check blood sugar daily E11.9 100 each 12   Lancets (ACCU-CHEK SOFT TOUCH) lancets Use to test blood glucose twice daily 50 each 12   levothyroxine (SYNTHROID) 75 MCG tablet TAKE 1 TABLET BY MOUTH EVERY DAY BEFORE BREAKFAST 90 tablet 1   losartan (COZAAR) 100 MG tablet TAKE 1 TABLET (100 MG TOTAL) BY MOUTH DAILY. MAY BEGIN AT 1/2 TABLET DAILY AND INCREASE TO 1 TABLET 90 tablet 1   metoprolol succinate (TOPROL-XL) 25 MG 24 hr tablet TAKE 1 TABLET DAILY. 90 tablet 3   Misc Natural Products (OSTEO BI-FLEX ADV JOINT SHIELD PO) Take 1 tablet by mouth daily.     potassium chloride (KLOR-CON) 10 MEQ tablet TAKE 1 TABLET (10 MEQ TOTAL) BY MOUTH 3 (THREE) TIMES DAILY. 90 tablet 5   No facility-administered medications prior to visit.     EXAM:  There were no vitals taken for this visit.  There is no height or weight on file to calculate BMI.  GENERAL: vitals reviewed and listed above, alert, oriented, appears well hydrated and in no acute distress HEENT: atraumatic, conjunctiva  clear, no obvious abnormalities on inspection of external nose and ears OP : no lesion edema or exudate  NECK: no obvious masses on inspection palpation  LUNGS: clear to auscultation bilaterally, no wheezes, rales or rhonchi, good air movement CV: HRRR, no clubbing cyanosis or  peripheral edema nl cap refill  MS: moves all extremities without noticeable focal  abnormality PSYCH: pleasant and cooperative, no obvious depression or anxiety Lab Results  Component Value Date   WBC 4.9 04/03/2020   HGB 12.1 04/03/2020   HCT 35.5 (L) 04/03/2020   PLT 168.0 04/03/2020   GLUCOSE 113 (H) 06/26/2020   CHOL 136 04/03/2020   TRIG 111.0 04/03/2020   HDL 51.50 04/03/2020   LDLCALC 62 04/03/2020   ALT 11 04/03/2020   AST 11 04/03/2020   NA 140 06/26/2020   K 3.6 06/26/2020   CL 102 06/26/2020   CREATININE 1.18 (H) 06/26/2020   BUN 20 06/26/2020   CO2 22 06/26/2020   TSH 4.07 04/03/2020   HGBA1C 6.5 (A) 11/28/2020    MICROALBUR 1.3 04/03/2020   BP Readings from Last 3 Encounters:  11/28/20 130/60  07/11/20 132/72  06/26/20 140/70    ASSESSMENT AND PLAN:  Discussed the following assessment and plan:  No diagnosis found.  -Patient advised to return or notify health care team  if  new concerns arise.  There are no Patient Instructions on file for this visit.  Standley Brooking. Issabella Rix M.D.

## 2021-06-06 ENCOUNTER — Encounter: Payer: Self-pay | Admitting: Sports Medicine

## 2021-06-06 ENCOUNTER — Ambulatory Visit: Payer: Medicare HMO | Admitting: Sports Medicine

## 2021-06-06 DIAGNOSIS — I890 Lymphedema, not elsewhere classified: Secondary | ICD-10-CM

## 2021-06-06 DIAGNOSIS — B351 Tinea unguium: Secondary | ICD-10-CM | POA: Diagnosis not present

## 2021-06-06 DIAGNOSIS — E119 Type 2 diabetes mellitus without complications: Secondary | ICD-10-CM

## 2021-06-06 DIAGNOSIS — M79609 Pain in unspecified limb: Secondary | ICD-10-CM

## 2021-06-06 NOTE — Progress Notes (Signed)
Subjective: ?Rebecca Rich is a 76 y.o. female patient with history of diabetes who returns to office today complaining of long,mildly painful nails while ambulating in shoes; unable to trim.  Reports that her vision is better after eye surgery.  No other issues noted. ? ?FBS 118. Last A1C 6.3 last PCP Panosh, Standley Brooking, MD was Nov. 2022.  ? ?Patient Active Problem List  ? Diagnosis Date Noted  ? Posterior vitreous detachment of right eye 03/26/2020  ? Posterior vitreous detachment of left eye 03/26/2020  ? Nuclear sclerotic cataract of both eyes 03/26/2020  ? Mild nonproliferative diabetic retinopathy of both eyes ([place]) 03/26/2020  ? Numbness and tingling of left leg 04/05/2018  ? Influenza vaccination declined by patient 04/05/2018  ? SVT (supraventricular tachycardia) ([place]) 12/31/2017  ? Diverticulosis of intestine with bleeding   ? Rectal bleeding   ? Hx of gout 08/21/2015  ? Mild aortic stenosis 08/21/2015  ? Muscle spasm 03/22/2013  ? Skin lesion of back 09/13/2012  ? Left shoulder pain 01/20/2012  ? Hypothyroid 05/31/2011  ? Elevated uric acid in blood 05/31/2011  ? Palmar nodule 03/25/2011  ? Abnormal urine 02/20/2011  ? 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 SHOULDER PAIN, RIGHT 02/07/2008  ? Diabetes mellitus type 2 in obese ([place]) 12/08/2006  ? HYPERLIPIDEMIA 12/08/2006  ? Morbid obesity with BMI of 50.0-59.9, adult ([place]) 12/08/2006  ? OSA on CPAP 12/08/2006  ? Essential hypertension 12/08/2006  ? OSTEOARTHRITIS 12/08/2006  ? THYROID FUNCTION TEST, ABNORMAL 12/08/2006  ? COLONIC POLYPS, HX OF 12/08/2006  ? ?Current Outpatient Medications on File Prior to Visit  ?Medication Sig Dispense Refill  ? amLODipine (NORVASC) 10 MG tablet Take 1 tablet (10 mg total) by mouth daily. 90 tablet 3  ? chlorthalidone (HYGROTON) 25 MG tablet TAKE 1 TABLET BY MOUTH EVERY DAY 90 tablet 0  ? cholecalciferol (VITAMIN D) 1000 UNITS tablet Take 1,000 Units by mouth daily.    ? glucose blood (ACCU-CHEK AVIVA  PLUS) test strip Use to check blood sugar daily E11.9 100 each 12  ? Lancets (ACCU-CHEK SOFT TOUCH) lancets Use to test blood glucose twice daily 50 each 12  ? levothyroxine (SYNTHROID) 75 MCG tablet TAKE 1 TABLET BY MOUTH EVERY DAY BEFORE BREAKFAST 90 tablet 1  ? losartan (COZAAR) 100 MG tablet TAKE 1 TABLET (100 MG TOTAL) BY MOUTH DAILY. MAY BEGIN AT 1/2 TABLET DAILY AND INCREASE TO 1 TABLET 90 tablet 1  ? metoprolol succinate (TOPROL-XL) 25 MG 24 hr tablet TAKE 1 TABLET DAILY. 90 tablet 3  ? Misc Natural Products (OSTEO BI-FLEX ADV JOINT SHIELD PO) Take 1 tablet by mouth daily.    ? potassium chloride (KLOR-CON) 10 MEQ tablet TAKE 1 TABLET (10 MEQ TOTAL) BY MOUTH 3 (THREE) TIMES DAILY. 90 tablet 5  ? ?No current facility-administered medications on file prior to visit.  ? ?No Known Allergies ? ?No results found for this or any previous visit (from the past 2160 hour(s)). ? ? ?Objective: ?General: Patient is awake, alert, and oriented x 3 and in no acute distress. ? ?Integument: Skin is warm, dry, and supple bilateral. Nails are tender, long, thickened and dystrophic with subungual debris, consistent with onychomycosis, 1-5 bilateral.  No signs of infection.  ? ?Vasculature:  Dorsalis Pedis pulse 1/4 bilateral. Posterior Tibial pulse 0/4 bilateral, chronic edema today to ankles, unchanged from prior. Capillary fill time <3 sec 1-5 bilateral. +hyperpigmentation, Positive hair growth to the level of the digits.Temperature gradient within normal limits.  ? ?  Neurology: Vibratory sensation intact bilateral.  ? ?Musculoskeletal:Asymptomatic pes pedal deformities noted bilateral. Muscular strength 5/5 in all lower extremity muscular groups bilateral without pain on range of motion.  ? ?Assessment and Plan: ?Problem List Items Addressed This Visit   ?None ?Visit Diagnoses   ? ? Pain due to onychomycosis of nail    -  Primary  ? Diabetes mellitus without complication ([place])      ? Lymphedema      ? ?  ? ?-Examined  patient. ?-Re-Discussed and educated patient on diabetic foot care, especially with regards to the vascular, neurological and musculoskeletal systems.  ?-Mechanically debrided all painful nails 1-5 bilateral using sterile nail nipper and filed with dremel without incident  ?-Continue with good supportive shoes and elevation to assist with edema control ?-Answered all patient questions ?-Patient to return  in 3 months for at risk foot care. ? ?Landis Martins, DPM ?

## 2021-06-10 ENCOUNTER — Ambulatory Visit (INDEPENDENT_AMBULATORY_CARE_PROVIDER_SITE_OTHER): Payer: Medicare HMO | Admitting: Internal Medicine

## 2021-06-10 ENCOUNTER — Other Ambulatory Visit: Payer: Self-pay | Admitting: Internal Medicine

## 2021-06-10 VITALS — BP 120/50 | HR 67 | Temp 98.9°F | Ht 61.0 in | Wt 270.6 lb

## 2021-06-10 DIAGNOSIS — Z79899 Other long term (current) drug therapy: Secondary | ICD-10-CM

## 2021-06-10 DIAGNOSIS — E039 Hypothyroidism, unspecified: Secondary | ICD-10-CM

## 2021-06-10 DIAGNOSIS — E11319 Type 2 diabetes mellitus with unspecified diabetic retinopathy without macular edema: Secondary | ICD-10-CM

## 2021-06-10 DIAGNOSIS — E785 Hyperlipidemia, unspecified: Secondary | ICD-10-CM

## 2021-06-10 DIAGNOSIS — E1169 Type 2 diabetes mellitus with other specified complication: Secondary | ICD-10-CM

## 2021-06-10 DIAGNOSIS — E669 Obesity, unspecified: Secondary | ICD-10-CM

## 2021-06-10 LAB — CBC WITH DIFFERENTIAL/PLATELET
Basophils Absolute: 0 10*3/uL (ref 0.0–0.1)
Basophils Relative: 0.7 % (ref 0.0–3.0)
Eosinophils Absolute: 0.2 10*3/uL (ref 0.0–0.7)
Eosinophils Relative: 3.6 % (ref 0.0–5.0)
HCT: 35.5 % — ABNORMAL LOW (ref 36.0–46.0)
Hemoglobin: 11.8 g/dL — ABNORMAL LOW (ref 12.0–15.0)
Lymphocytes Relative: 34.3 % (ref 12.0–46.0)
Lymphs Abs: 1.6 10*3/uL (ref 0.7–4.0)
MCHC: 33.4 g/dL (ref 30.0–36.0)
MCV: 88.3 fl (ref 78.0–100.0)
Monocytes Absolute: 0.4 10*3/uL (ref 0.1–1.0)
Monocytes Relative: 8.9 % (ref 3.0–12.0)
Neutro Abs: 2.4 10*3/uL (ref 1.4–7.7)
Neutrophils Relative %: 52.5 % (ref 43.0–77.0)
Platelets: 153 10*3/uL (ref 150.0–400.0)
RBC: 4.01 Mil/uL (ref 3.87–5.11)
RDW: 14.3 % (ref 11.5–15.5)
WBC: 4.6 10*3/uL (ref 4.0–10.5)

## 2021-06-10 LAB — LIPID PANEL
Cholesterol: 134 mg/dL (ref 0–200)
HDL: 50.9 mg/dL (ref >39.00)
LDL Cholesterol: 62 mg/dL (ref 0–99)
NonHDL: 83.14
Total CHOL/HDL Ratio: 3
Triglycerides: 105 mg/dL (ref 0.0–149.0)
VLDL: 21 mg/dL (ref 0.0–40.0)

## 2021-06-10 LAB — BASIC METABOLIC PANEL
BUN: 26 mg/dL — ABNORMAL HIGH (ref 6–23)
CO2: 24 mEq/L (ref 19–32)
Calcium: 9.5 mg/dL (ref 8.4–10.5)
Chloride: 107 mEq/L (ref 96–112)
Creatinine, Ser: 1.22 mg/dL — ABNORMAL HIGH (ref 0.40–1.20)
GFR: 42.61 mL/min — ABNORMAL LOW (ref >60.00)
Glucose, Bld: 107 mg/dL — ABNORMAL HIGH (ref 70–99)
Potassium: 3.8 mEq/L (ref 3.5–5.1)
Sodium: 139 mEq/L (ref 135–145)

## 2021-06-10 LAB — HEPATIC FUNCTION PANEL
ALT: 12 U/L (ref 0–35)
AST: 12 U/L (ref 0–37)
Albumin: 4 g/dL (ref 3.5–5.2)
Alkaline Phosphatase: 60 U/L (ref 39–117)
Bilirubin, Direct: 0.1 mg/dL (ref 0.0–0.3)
Total Bilirubin: 0.5 mg/dL (ref 0.2–1.2)
Total Protein: 7.2 g/dL (ref 6.0–8.3)

## 2021-06-10 LAB — HEMOGLOBIN A1C: Hgb A1c MFr Bld: 7 % — ABNORMAL HIGH (ref 4.6–6.5)

## 2021-06-10 LAB — TSH: TSH: 3.62 u[IU]/mL (ref 0.35–5.50)

## 2021-06-10 NOTE — Progress Notes (Signed)
? ?Chief Complaint  ?Patient presents with  ? Follow-up  ? ? ?HPI: ?Rebecca Rich 76 y.o. come in for Chronic disease management  ? ?OSA:  CPAP  went to nasal   doing better  gums and snoring.   Last march .  ? ?Not traveling  but Daughter  from [place] has moved in with her. While moving to new residence   And now gained 10 #  w her there     just back to reg eating.   Recently  ? ?Had eye surgery and can see beter in last months .   [place] . ? ?Feet doing well getting 3 mos pod check nail clips  ? ?Not interested  in statin therapy " too many pills anyway"  ? ?ROS: See pertinent positives and negatives per HPI. No need for glasses  no cp sob fall ? ?Past Medical History:  ?Diagnosis Date  ? Acute bronchospasm 08/24/2009  ? Acute lower GI bleeding 02/16/2017  ? Acute perforated appendicitis w abscess s/p lap appendectomy 12/30/2017 12/30/2017  ? Acute sphenoidal sinusitis 02/26/2010  ? Qualifier: Diagnosis of  By: Regis Bill MD, Standley Brooking   ? CARDIAC MURMUR 12/08/2006  ? COLONIC POLYPS, HX OF 12/08/2006  ? DIABETES MELLITUS, TYPE II 12/08/2006  ? Gallbladder/common duct stone, without infection, with obstruction 03/01/2010  ? removed ercp  ? HYPERLIPIDEMIA 12/08/2006  ? HYPERTENSION 12/08/2006  ? 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 INFECTION, SKIN AND SOFT TISSUE 07/28/2008  ? 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 LIVER FUNCTION TESTS, ABNORMAL 07/28/2008  ? Morbid obesity ([place]) 12/08/2006  ? OBESITY 09/24/2009  ? OBSTRUCTIVE SLEEP APNEA 12/08/2006  ? OSTEOARTHRITIS 12/08/2006  ? RUQ PAIN 06/16/2008  ? SHOULDER PAIN, RIGHT 02/07/2008  ? Sleep apnea   ? Swelling of limb 07/28/2008  ? THYROID FUNCTION TEST, ABNORMAL 12/08/2006  ? ? ?Family History  ?Problem Relation Age of Onset  ? Other Mother   ?     blood clots  ? Cervical cancer Mother   ? Cancer Mother   ? Heart disease Brother   ? Diabetes Brother   ? Heart disease Sister   ? Diabetes Sister   ? Heart disease Brother   ? Diabetes Brother   ? Heart disease Brother   ? Diabetes Brother   ? Stroke  Sister   ? Diabetes Sister   ? Throat cancer Father   ? Cancer Father   ? Diabetes Other   ?     all siblings, 4 brothers, 5 sisters  ? ? ?Social History  ? ?Socioeconomic History  ? Marital status: Divorced  ?  Spouse name: Not on file  ? Number of children: Not on file  ? Years of education: Not on file  ? Highest education level: Master's degree (e.g., MA, MS, MEng, MEd, MSW, MBA)  ?Occupational History  ? Occupation: retired  ?  Employer: RETIRED  ?Tobacco Use  ? Smoking status: Never  ? Smokeless tobacco: Never  ?Vaping Use  ? Vaping Use: Never used  ?Substance and Sexual Activity  ? Alcohol use: No  ?  Alcohol/week: 0.0 standard drinks  ? Drug use: No  ? Sexual activity: Not on file  ?Other Topics Concern  ? Not on file  ?Social History Narrative  ? Master level education in math  ? Pt is currently retired  ? Pt is divorced with children  ? Recently had to move had a break in and thus away from her pool exercise  ? ?Social Determinants  of Health  ? ?Financial Resource Strain: Low Risk   ? Difficulty of Paying Living Expenses: Not hard at all  ?Food Insecurity: No Food Insecurity  ? Worried About Charity fundraiser in the Last Year: Never true  ? Ran Out of Food in the Last Year: Never true  ?Transportation Needs: No Transportation Needs  ? Lack of Transportation (Medical): No  ? Lack of Transportation (Non-Medical): No  ?Physical Activity: Insufficiently Active  ? Days of Exercise per Week: 3 days  ? Minutes of Exercise per Session: 40 min  ?Stress: No Stress Concern Present  ? Feeling of Stress : Not at all  ?Social Connections: Moderately Integrated  ? Frequency of Communication with Friends and Family: More than three times a week  ? Frequency of Social Gatherings with Friends and Family: Three times a week  ? Attends Religious Services: More than 4 times per year  ? Active Member of Clubs or Organizations: Yes  ? Attends Archivist Meetings: More than 4 times per year  ? Marital Status: Divorced   ? ? ?Outpatient Medications Prior to Visit  ?Medication Sig Dispense Refill  ? chlorthalidone (HYGROTON) 25 MG tablet TAKE 1 TABLET BY MOUTH EVERY DAY 90 tablet 0  ? cholecalciferol (VITAMIN D) 1000 UNITS tablet Take 1,000 Units by mouth daily.    ? glucose blood (ACCU-CHEK AVIVA PLUS) test strip Use to check blood sugar daily E11.9 100 each 12  ? Lancets (ACCU-CHEK SOFT TOUCH) lancets Use to test blood glucose twice daily 50 each 12  ? levothyroxine (SYNTHROID) 75 MCG tablet TAKE 1 TABLET BY MOUTH EVERY DAY BEFORE BREAKFAST 90 tablet 1  ? losartan (COZAAR) 100 MG tablet TAKE 1 TABLET (100 MG TOTAL) BY MOUTH DAILY. MAY BEGIN AT 1/2 TABLET DAILY AND INCREASE TO 1 TABLET 90 tablet 1  ? metoprolol succinate (TOPROL-XL) 25 MG 24 hr tablet TAKE 1 TABLET DAILY. 90 tablet 3  ? Misc Natural Products (OSTEO BI-FLEX ADV JOINT SHIELD PO) Take 1 tablet by mouth daily.    ? potassium chloride (KLOR-CON) 10 MEQ tablet TAKE 1 TABLET (10 MEQ TOTAL) BY MOUTH 3 (THREE) TIMES DAILY. 90 tablet 5  ? amLODipine (NORVASC) 10 MG tablet Take 1 tablet (10 mg total) by mouth daily. 90 tablet 3  ? ?No facility-administered medications prior to visit.  ? ? ? ?EXAM: ? ?BP (!) 120/50 (BP Location: Right Arm)   Pulse 67   Temp 98.9 ?F (37.2 ?C) (Oral)   Ht '5\' 1"'$  (1.549 m)   Wt 270 lb 9.6 oz (122.7 kg)   SpO2 98%   BMI 51.13 kg/m?  ? ?Body mass index is 51.13 kg/m?. ? ?GENERAL: vitals reviewed and listed above, alert, oriented, appears well hydrated and in no acute distress ?HEENT: atraumatic, conjunctiva  clear, no obvious abnormalities on inspection of external nose and ears masked  ?NECK: no obvious masses on inspection palpation  ?LUNGS: clear to auscultation bilaterally, no wheezes, rales or rhonchi, good air movement ?CV: HRRR, 12/6 sem  usb   no clubbing cyanosisslight  peripheral edema nl cap refill  ?MS: moves all extremities without noticeable focal  abnormality ?PSYCH: pleasant and cooperative, no obvious depression or  anxiety ?Lab Results  ?Component Value Date  ? WBC 4.9 04/03/2020  ? HGB 12.1 04/03/2020  ? HCT 35.5 (L) 04/03/2020  ? PLT 168.0 04/03/2020  ? GLUCOSE 113 (H) 06/26/2020  ? CHOL 136 04/03/2020  ? TRIG 111.0 04/03/2020  ? HDL 51.50 04/03/2020  ?  [place] 62 04/03/2020  ? ALT 11 04/03/2020  ? AST 11 04/03/2020  ? NA 140 06/26/2020  ? K 3.6 06/26/2020  ? CL 102 06/26/2020  ? CREATININE 1.18 (H) 06/26/2020  ? BUN 20 06/26/2020  ? CO2 22 06/26/2020  ? TSH 4.07 04/03/2020  ? HGBA1C 6.5 (A) 11/28/2020  ? MICROALBUR 1.3 04/03/2020  ? ?BP Readings from Last 3 Encounters:  ?06/10/21 (!) 120/50  ?11/28/20 130/60  ?07/11/20 132/72  ? ?Fasting today   blood ordered  ?ASSESSMENT AND PLAN: ? ?Discussed the following assessment and plan: ? ?Diabetes mellitus type 2 in obese ([place]) - Plan: Basic metabolic panel, CBC with Differential/Platelet, Hemoglobin A1c, Hepatic function panel, Lipid panel, TSH, Microalbumin / creatinine urine ratio, Microalbumin / creatinine urine ratio, TSH, Lipid panel, Hepatic function panel, Hemoglobin A1c, CBC with Differential/Platelet, Basic metabolic panel ? ?Diabetic retinopathy associated with type 2 diabetes mellitus, macular edema presence unspecified, unspecified laterality, unspecified retinopathy severity ([place]) - Plan: Basic metabolic panel, CBC with Differential/Platelet, Hemoglobin A1c, Hepatic function panel, Lipid panel, TSH, Microalbumin / creatinine urine ratio, Microalbumin / creatinine urine ratio, TSH, Lipid panel, Hepatic function panel, Hemoglobin A1c, CBC with Differential/Platelet, Basic metabolic panel ? ?Medication management - Plan: Basic metabolic panel, CBC with Differential/Platelet, Hemoglobin A1c, Hepatic function panel, Lipid panel, TSH, Microalbumin / creatinine urine ratio, Microalbumin / creatinine urine ratio, TSH, Lipid panel, Hepatic function panel, Hemoglobin A1c, CBC with Differential/Platelet, Basic metabolic panel ? ?Hypothyroidism, unspecified type - Plan: Basic  metabolic panel, CBC with Differential/Platelet, Hemoglobin A1c, Hepatic function panel, Lipid panel, TSH, Microalbumin / creatinine urine ratio, Microalbumin / creatinine urine ratio, TSH, Lipid panel, Hepatic function

## 2021-06-10 NOTE — Patient Instructions (Signed)
Good to see you today .  ?Glad you are back  on track. ?Lab today . ? ?Plan  fu in 4-6 months  depending on results  ?

## 2021-06-11 LAB — MICROALBUMIN / CREATININE URINE RATIO
Creatinine,U: 106.5 mg/dL
Microalb Creat Ratio: 1.2 mg/g (ref 0.0–30.0)
Microalb, Ur: 1.2 mg/dL (ref 0.0–1.9)

## 2021-06-11 NOTE — Progress Notes (Signed)
Hg a1c is back up to 7   as you maybe had expected .   Kidney function stable some decrease, cholesterol is in good range .  Liver panel thyroid normal ranges .  Borderline anemia again.   ?So Intensify lifestyle interventions.  As planned  and ROV in 4-6 months ( no later than 6 months) we can do hg a1c at the office visit

## 2021-06-14 ENCOUNTER — Other Ambulatory Visit: Payer: Self-pay

## 2021-06-14 MED ORDER — LEVOTHYROXINE SODIUM 75 MCG PO TABS: ORAL_TABLET | ORAL | 1 refills | Status: DC

## 2021-06-17 ENCOUNTER — Ambulatory Visit: Payer: Medicare HMO | Admitting: Adult Health

## 2021-06-20 LAB — HM MAMMOGRAPHY

## 2021-06-27 ENCOUNTER — Other Ambulatory Visit: Payer: Self-pay | Admitting: Cardiology

## 2021-06-28 ENCOUNTER — Encounter: Payer: Self-pay | Admitting: Internal Medicine

## 2021-07-10 ENCOUNTER — Other Ambulatory Visit: Payer: Self-pay | Admitting: Cardiology

## 2021-07-24 ENCOUNTER — Ambulatory Visit: Payer: Medicare HMO | Admitting: Internal Medicine

## 2021-07-24 ENCOUNTER — Encounter: Payer: Self-pay | Admitting: Internal Medicine

## 2021-07-24 VITALS — BP 110/52 | HR 73 | Ht 61.0 in | Wt 258.6 lb

## 2021-07-24 DIAGNOSIS — I350 Nonrheumatic aortic (valve) stenosis: Secondary | ICD-10-CM

## 2021-07-24 NOTE — Progress Notes (Signed)
Cardiology Office Note   Date:  07/24/2021   ID:  Rebecca, Rich 11-18-1945, MRN [id number]  PCP:  Burnis Medin, MD  Cardiologist:  Dr. Harrington Challenger, MD   No chief complaint on file.   History of Present Illness: Rebecca Rich is a 76 y.o. female  with hx of mild AS, HTN, SVT, OSA (on CPAP), DM, HL, obesity  and gout.   She had SVT when hospitalized in 2019 (post op)   Echo in 2019 showed very mild AS.    Myoviw in 2020 was negative for ischemia    Last echo in 2020 showed mild AS with mean gradient of 13 mm Hg     The pt was last seen in cardiology by Tonny Branch in 2022.    She says her breathing is OK   She denies CP   No dizziness  No palpitations     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   INFECTION, 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   LIVER FUNCTION TESTS, ABNORMAL 07/28/2008   Morbid obesity ([place]) 12/08/2006   OBESITY 09/24/2009   OBSTRUCTIVE SLEEP APNEA 12/08/2006   OSTEOARTHRITIS 12/08/2006   RUQ PAIN 06/16/2008   SHOULDER PAIN, RIGHT 02/07/2008   Sleep apnea    Swelling of limb 07/28/2008   THYROID FUNCTION TEST, ABNORMAL 12/08/2006    Past Surgical History:  Procedure Laterality Date   ABDOMINAL HYSTERECTOMY     CHOLECYSTECTOMY  06/27/2008   COLONOSCOPY N/A 12/13/2012   Procedure: COLONOSCOPY;  Surgeon: Juanita Craver, MD;  Location: [facility];  Service: Endoscopy;  Laterality: N/A;   COLONOSCOPY Left 02/17/2017    Procedure: COLONOSCOPY;  Surgeon: Carol Ada, MD;  Location: [facility];  Service: Endoscopy;  Laterality: Left;   common duct stone     ERCP    EYE SURGERY     LAPAROSCOPIC APPENDECTOMY N/A 12/30/2017   Procedure: APPENDECTOMY LAPAROSCOPIC;  Surgeon: Stark Klein, MD;  Location: [facility];  Service: General;  Laterality: N/A;     Current Outpatient Medications  Medication Sig Dispense Refill   amLODipine (NORVASC) 10 MG tablet TAKE 1 TABLET BY MOUTH EVERY DAY 30 tablet 0   chlorthalidone (HYGROTON) 25 MG tablet TAKE 1 TABLET BY MOUTH EVERY DAY 90 tablet 0   cholecalciferol (VITAMIN D) 1000 UNITS tablet Take 1,000 Units by mouth daily.     glucose blood (ACCU-CHEK AVIVA PLUS) test strip Use to check blood sugar daily E11.9 100 each 12   Lancets (ACCU-CHEK SOFT TOUCH) lancets Use to test blood glucose twice daily 50 each 12   levothyroxine (SYNTHROID) 75 MCG tablet TAKE 1 TABLET BY MOUTH EVERY DAY BEFORE BREAKFAST 90 tablet 1   losartan (COZAAR) 100 MG tablet TAKE 1 TABLET (100 MG TOTAL) BY MOUTH DAILY. MAY BEGIN AT 1/2 TABLET DAILY AND INCREASE TO 1 TABLET 90 tablet 1   metoprolol succinate (TOPROL-XL) 25 MG 24 hr tablet TAKE 1 TABLET BY MOUTH EVERY DAY  30 tablet 0   Misc Natural Products (OSTEO BI-FLEX ADV JOINT SHIELD PO) Take 1 tablet by mouth daily.     potassium chloride (KLOR-CON) 10 MEQ tablet TAKE 1 TABLET (10 MEQ TOTAL) BY MOUTH 3 (THREE) TIMES DAILY. 90 tablet 5   No current facility-administered medications for this visit.    Allergies:   Patient has no known allergies.    Social History:  The patient  reports that she has never smoked. She has never used smokeless tobacco. She reports that she does not drink alcohol and does not use drugs.   Family History:  The patient's family history includes Cancer in her father and mother; Cervical cancer in her mother; Diabetes in her brother, brother, brother, sister, sister, and another family member; Heart disease in her brother,  brother, brother, and sister; Other in her mother; Stroke in her sister; Throat cancer in her father.    ROS:  Please see the history of present illness. Otherwise, review of systems are positive for none.   All other systems are reviewed and negative.    PHYSICAL EXAM: VS:  BP (!) 110/52   Pulse 73   Ht '5\' 1"'$  (1.549 m)   Wt 258 lb 9.6 oz (117.3 kg)   SpO2 97%   BMI 48.86 kg/m  , BMI Body mass index is 48.86 kg/m.   General: Morbidly obese, in NAD Neck: Negative for carotid bruits. No JVD Lungs:Clear to ausculation bilaterally.   Cardiovascular: RRR with S1 S2  Gr II/VI systolic murmur LSB   Extremities: Tr edema RLE Greater than LLE   Radial pulses 2+ bilaterally Neuro: Alert and oriented. No focal deficits. No facial asymmetry. MAE spontaneously. Psych: Responds to questions appropriately with normal affect.     EKG:  EKG is ordered today.  SR  73 bpm  First degree AV block  PR 232      Recent Labs: 06/10/2021: ALT 12; BUN 26; Creatinine, Ser 1.22; Hemoglobin 11.8; Platelets 153.0; Potassium 3.8; Sodium 139; TSH 3.62    Lipid Panel    Component Value Date/Time   CHOL 134 06/10/2021 1207   TRIG 105.0 06/10/2021 1207   HDL 50.90 06/10/2021 1207   CHOLHDL 3 06/10/2021 1207   VLDL 21.0 06/10/2021 1207   LDLCALC 62 06/10/2021 1207     Wt Readings from Last 3 Encounters:  07/24/21 258 lb 9.6 oz (117.3 kg)  06/10/21 270 lb 9.6 oz (122.7 kg)  03/21/21 260 lb (117.9 kg)    Other studies Reviewed: Additional studies/ records that were reviewed today include:  Review of the above records demonstrates:   Echocardiogram 12/15/2018 IMPRESSIONS    1. Left ventricular ejection fraction, by visual estimation, is 60 to 65%. The left ventricle has normal function. There is moderately increased left ventricular hypertrophy.  2. Left ventricular diastolic parameters are consistent with Grade II diastolic dysfunction (pseudonormalization).  3. Global right ventricle has normal  systolic function.The right ventricular size is normal. No increase in right ventricular wall thickness.  4. Left atrial size was mildly dilated.  5. Right atrial size was normal.  6. Moderate calcification of the mitral valve leaflet(s).  7. Moderate thickening of the mitral valve leaflet(s).  8. The mitral valve is normal in structure. Trace mitral valve regurgitation.  9. The tricuspid valve is normal in structure. Tricuspid valve regurgitation is mild. 10. The aortic valve has an indeterminant number of cusps. Aortic valve regurgitation is mild. Mild aortic valve stenosis. 11. The pulmonic valve was grossly  normal. Pulmonic valve regurgitation is mild.  ASSESSMENT AND PLAN:  1.   AS     Mild On echo 2020  Will repeat       2  HTN   Good control  Continue losartan, chlortalidone, metoprolol       Last K 3.8    3  hx SVT    No recurrence    One episode  Keep on toprol  4.  Diabetes Last A1C in May 2023 was 7   Watch carbs    5.Obesity: Contiue to watch diet, carbs      F/U in APril    Current medicines are reviewed at length with the patient today.  The patient does not have concerns regarding medicines. Disposition:   FU with Dr. Harrington Challenger in 1 year  Signed, Dorris Carnes, MD  07/24/2021 4:21 PM    [address], [place], [place]  [address] Phone: [phone]; Fax: [phone] \

## 2021-07-24 NOTE — Patient Instructions (Signed)
Medication Instructions:   *If you need a refill on your cardiac medications before your next appointment, please call your pharmacy*   Lab Work:  If you have labs (blood work) drawn today and your tests are completely normal, you will receive your results only by: [place] (if you have MyChart) OR A paper copy in the mail If you have any lab test that is abnormal or we need to change your treatment, we will call you to review the results.   Testing/Procedures: Your physician has requested that you have an echocardiogram. Echocardiography is a painless test that uses sound waves to create images of your heart. It provides your doctor with information about the size and shape of your heart and how well your heart's chambers and valves are working. This procedure takes approximately one hour. There are no restrictions for this procedure.    Follow-Up: At [facility], you and your health needs are our priority.  As part of our continuing mission to provide you with exceptional heart care, we have created designated Provider Care Teams.  These Care Teams include your primary Cardiologist (physician) and Advanced Practice Providers (APPs -  Physician Assistants and Nurse Practitioners) who all work together to provide you with the care you need, when you need it.  We recommend signing up for the patient portal called "MyChart".  Sign up information is provided on this After Visit Summary.  MyChart is used to connect with patients for Virtual Visits (Telemedicine).  Patients are able to view lab/test results, encounter notes, upcoming appointments, etc.  Non-urgent messages can be sent to your provider as well.   To learn more about what you can do with MyChart, go to [email or web address].    Your next appointment:   10 month(s)  The format for your next appointment:   In Person  Provider:   Dorris Carnes, MD     Other Instructions   Important Information About  [place]

## 2021-07-26 ENCOUNTER — Other Ambulatory Visit: Payer: Self-pay | Admitting: Cardiology

## 2021-08-13 ENCOUNTER — Ambulatory Visit (HOSPITAL_COMMUNITY): Payer: Medicare HMO | Attending: Cardiology

## 2021-08-13 DIAGNOSIS — I350 Nonrheumatic aortic (valve) stenosis: Secondary | ICD-10-CM | POA: Insufficient documentation

## 2021-08-13 LAB — ECHOCARDIOGRAM COMPLETE
AR max vel: 2.67 cm2
AV Area VTI: 2.62 cm2
AV Area mean vel: 2.68 cm2
AV Mean grad: 11 mmHg
AV Peak grad: 19.5 mmHg
Ao pk vel: 2.21 m/s
Area-P 1/2: 4.42 cm2
MV VTI: 3.02 cm2
P 1/2 time: 251 msec
S' Lateral: 2.1 cm

## 2021-08-15 ENCOUNTER — Other Ambulatory Visit: Payer: Self-pay | Admitting: Cardiology

## 2021-09-05 ENCOUNTER — Other Ambulatory Visit: Payer: Self-pay | Admitting: Internal Medicine

## 2021-09-07 ENCOUNTER — Other Ambulatory Visit: Payer: Self-pay | Admitting: Internal Medicine

## 2021-09-10 ENCOUNTER — Ambulatory Visit: Payer: Medicare HMO | Admitting: Nurse Practitioner

## 2021-09-10 ENCOUNTER — Encounter: Payer: Self-pay | Admitting: Nurse Practitioner

## 2021-09-10 DIAGNOSIS — Z9989 Dependence on other enabling machines and devices: Secondary | ICD-10-CM

## 2021-09-10 DIAGNOSIS — G4733 Obstructive sleep apnea (adult) (pediatric): Secondary | ICD-10-CM

## 2021-09-10 NOTE — Progress Notes (Signed)
$'@Patient'd$  ID: Rebecca Rich, female    DOB: [birth year], 76 y.o.   MRN: [id number]  Chief Complaint  Patient presents with   Follow-up    Patient has no complaints.     Referring provider: Burnis Medin, MD  HPI: 76 year old female, never smoker followed for OSA on BiPAP. She is a patient of Dr. Bari Mantis and last seen in office 09/05/2019 by [name],NP. Past medical history significant for diastolic CHF, AS, HTN, idiopathic cardiomegaly, SVT, DM, hypothyroid, HLD, morbid obesity.   TEST/EVENTS:  08/1993 NPSG at [facility]: AHI 85/h, corrected by CPAP 10 cm  06/2018 PAP titration: severe and CPAP was inadequate; corrected by BiPAP 25/20 cm 02/2018 HST: severe OSA with 76/h and severe desaturations  09/05/2019: OV with [name] NP for follow up. Doing well with BiPAP. Occasionally falls asleep without putting mask on. Download with 80% compliance, av usage 5.5 hr; residual AHI 6.2.   09/10/2021: Today - follow up Patient presents today for overdue follow up. She has been doing well on BiPAP therapy. She wears it most nights. Will rarely fall asleep without it on. She wakes feeling like she rested well. Sleeps throughout the night without waking up. She denies any drowsy driving, excessive daytime fatigue, or morning headaches.   08/09/2021-09/07/2021 BiPAP Auto Max IPAP 25, EPAP 10, PS 5  28/30 days; 67% >4 hours; av usage 5 hr 14 min Leaks median 0.7, 95th 26.9 IPAP 95th 17.2, EPAP 95th 12.2 AHI 2.8  No Known Allergies  Immunization History  Administered Date(s) Administered   PFIZER(Purple Top)SARS-COV-2 Vaccination 10/31/2020, 11/15/2020   Pneumococcal Conjugate-13 11/23/2015   Pneumococcal Polysaccharide-23 05/24/2012   Td 01/28/2003   Tdap 12/05/2019    Past Medical History:  Diagnosis Date   Acute bronchospasm 08/24/2009   Acute lower GI bleeding 02/16/2017   Acute perforated appendicitis w abscess s/p lap appendectomy 12/30/2017 12/30/2017   Acute sphenoidal sinusitis 02/26/2010    Qualifier: Diagnosis of  By: Regis Bill MD, Standley Brooking    CARDIAC MURMUR 12/08/2006   COLONIC POLYPS, HX OF 12/08/2006   DIABETES MELLITUS, TYPE II 12/08/2006   Gallbladder/common duct stone, without infection, with obstruction 03/01/2010   removed ercp   HYPERLIPIDEMIA 12/08/2006   HYPERTENSION 12/08/2006   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   INFECTION, 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   LIVER FUNCTION TESTS, ABNORMAL 07/28/2008   Morbid obesity ([place]) 12/08/2006   OBESITY 09/24/2009   OBSTRUCTIVE SLEEP APNEA 12/08/2006   OSTEOARTHRITIS 12/08/2006   RUQ PAIN 06/16/2008   SHOULDER PAIN, RIGHT 02/07/2008   Sleep apnea    Swelling of limb 07/28/2008   THYROID FUNCTION TEST, ABNORMAL 12/08/2006    Tobacco History: Social History   Tobacco Use  Smoking Status Never  Smokeless Tobacco Never   Counseling given: Not Answered   Outpatient Medications Prior to Visit  Medication Sig Dispense Refill   amLODipine (NORVASC) 10 MG tablet TAKE 1 TABLET BY MOUTH EVERY DAY 90 tablet 3   chlorthalidone (HYGROTON) 25 MG tablet TAKE 1 TABLET BY MOUTH EVERY DAY 90 tablet 0   cholecalciferol (VITAMIN D) 1000 UNITS tablet Take 1,000 Units by mouth daily.     glucose blood (ACCU-CHEK AVIVA PLUS) test strip Use to check blood sugar daily E11.9 100 each 12   Lancets (ACCU-CHEK SOFT TOUCH) lancets Use to test blood glucose twice daily 50 each 12   levothyroxine (SYNTHROID) 75 MCG tablet TAKE 1 TABLET BY MOUTH EVERY DAY BEFORE BREAKFAST 90 tablet 1  losartan (COZAAR) 100 MG tablet TAKE 1 TABLET (100 MG TOTAL) BY MOUTH DAILY. MAY BEGIN AT 1/2 TABLET DAILY AND INCREASE TO 1 TABLET 90 tablet 1   metoprolol succinate (TOPROL-XL) 25 MG 24 hr tablet TAKE 1 TABLET BY MOUTH EVERY DAY 30 tablet 10   Misc Natural Products (OSTEO BI-FLEX ADV JOINT SHIELD PO) Take 1 tablet by mouth daily.     potassium chloride (KLOR-CON) 10 MEQ tablet TAKE 1 TABLET (10 MEQ TOTAL) BY MOUTH 3 (THREE) TIMES DAILY. 90 tablet 5   No  facility-administered medications prior to visit.     Review of Systems:   Constitutional: No weight loss or gain, night sweats, fevers, chills, or lassitude.+occasional fatigue (significantly improved with BiPAP) HEENT: No headaches, difficulty swallowing, tooth/dental problems, or sore throat. No sneezing, itching, ear ache, nasal congestion, or post nasal drip CV:  No chest pain, orthopnea, PND, swelling in lower extremities, anasarca, dizziness, palpitations, syncope Resp: No shortness of breath with exertion or at rest. No excess mucus or change in color of mucus. No productive or non-productive. No hemoptysis. No wheezing.  No chest wall deformity GU: No dysuria, change in color of urine, urgency or frequency.  No flank pain, no hematuria  Neuro: No dizziness or lightheadedness.  Psych: No depression or anxiety. Mood stable.     Physical Exam:  BP 120/60 (BP Location: Right Arm, Patient Position: Sitting, Cuff Size: Normal)   Pulse 78   Temp 98.8 F (37.1 C) (Oral)   Ht '5\' 1"'$  (1.549 m)   Wt 256 lb 3.2 oz (116.2 kg)   SpO2 96%   BMI 48.41 kg/m   GEN: Pleasant, interactive, well-appearing; morbidly obese; in no acute distress. HEENT:  Normocephalic and atraumatic. PERRLA. Sclera white. Nasal turbinates pink, moist and patent bilaterally. No rhinorrhea present. Oropharynx pink and moist, without exudate or edema. No lesions, ulcerations, or postnasal drip.  NECK:  Supple w/ fair ROM.  CV: RRR, systolic murmur, no peripheral edema. Pulses intact, +2 bilaterally. No cyanosis, pallor or clubbing. PULMONARY:  Unlabored, regular breathing. Clear bilaterally A&P w/o wheezes/rales/rhonchi. No accessory muscle use. No dullness to percussion. GI: BS present and normoactive. Soft, non-tender to palpation.  MSK: No erythema, warmth or tenderness.  Neuro: A/Ox3. No focal deficits noted.   Skin: Warm, no lesions or rashe Psych: Normal affect and behavior. Judgement and thought content  appropriate.     Lab Results:  CBC    Component Value Date/Time   WBC 4.6 06/10/2021 1207   RBC 4.01 06/10/2021 1207   HGB 11.8 (L) 06/10/2021 1207   HCT 35.5 (L) 06/10/2021 1207   PLT 153.0 06/10/2021 1207   MCV 88.3 06/10/2021 1207   MCH 29.3 01/08/2018 0906   MCHC 33.4 06/10/2021 1207   RDW 14.3 06/10/2021 1207   LYMPHSABS 1.6 06/10/2021 1207   MONOABS 0.4 06/10/2021 1207   EOSABS 0.2 06/10/2021 1207   BASOSABS 0.0 06/10/2021 1207    BMET    Component Value Date/Time   NA 139 06/10/2021 1207   NA 140 06/26/2020 1115   K 3.8 06/10/2021 1207   CL 107 06/10/2021 1207   CO2 24 06/10/2021 1207   GLUCOSE 107 (H) 06/10/2021 1207   BUN 26 (H) 06/10/2021 1207   BUN 20 06/26/2020 1115   CREATININE 1.22 (H) 06/10/2021 1207   CREATININE 0.92 10/31/2014 1826   CALCIUM 9.5 06/10/2021 1207   GFRNONAA >60 01/06/2018 0935   GFRAA >60 01/06/2018 0935    BNP  Component Value Date/Time   BNP 165.2 (H) 10/31/2014 1838     Imaging:  ECHOCARDIOGRAM COMPLETE  Result Date: 08/13/2021    ECHOCARDIOGRAM REPORT   Patient Name:   CRYSTALEE VENTRESS Date of Exam: 08/13/2021 Medical Rec #:  [id number]      Height:       61.0 in Accession #:    [id number]     Weight:       258.6 lb Date of Birth:  26-Jun-1945      BSA:          2.107 m Patient Age:    18 years       BP:           110/52 mmHg Patient Gender: F              HR:           70 bpm. Exam Location:  [place] Procedure: 2D Echo, 3D Echo, Cardiac Doppler, Color Doppler and Strain Analysis Indications:    I35.0 Aortic Stenosis  History:        Patient has prior history of Echocardiogram examinations, most                 recent 12/15/2018. Cardiomegaly, Signs/Symptoms:Murmur; Risk                 Factors:Family History of Coronary Artery Disease, Hypertension,                 Diabetes, Dyslipidemia and Sleep Apnea. Morbid Obesity.  Sonographer:    Deliah Boston RDCS Referring Phys: [place]  1. Left ventricular  ejection fraction, by estimation, is 65 to 70%. Left ventricular ejection fraction by 3D volume is 69 %. The left ventricle has normal function. The left ventricle has no regional wall motion abnormalities. There is mild concentric left ventricular hypertrophy. Left ventricular diastolic parameters are consistent with Grade I diastolic dysfunction (impaired relaxation). The average left ventricular global longitudinal strain is -25.1 %. The global longitudinal strain is normal.  2. Right ventricular systolic function is normal. The right ventricular size is normal.  3. Left atrial size was moderately dilated.  4. The mitral valve is grossly normal. Trivial mitral valve regurgitation. Moderate mitral annular calcification.  5. The aortic valve is tricuspid. There is moderate calcification of the aortic valve. There is moderate thickening of the aortic valve. Aortic valve regurgitation is moderate. Mild aortic valve stenosis. Aortic valve mean gradient measures 11.0 mmHg. Aortic valve Vmax measures 2.21 m/s.  6. Aortic dilatation noted. There is mild dilatation of the ascending aorta, measuring 41 mm.  7. The inferior vena cava is dilated in size with <50% respiratory variability, suggesting right atrial pressure of 15 mmHg. Comparison(s): Compared to prior TTE in 2020, the aortic regurgitation now appears moderate. FINDINGS  Left Ventricle: Left ventricular ejection fraction, by estimation, is 65 to 70%. Left ventricular ejection fraction by 3D volume is 69 %. The left ventricle has normal function. The left ventricle has no regional wall motion abnormalities. The average left ventricular global longitudinal strain is -25.1 %. The global longitudinal strain is normal. The left ventricular internal cavity size was normal in size. There is mild concentric left ventricular hypertrophy. Left ventricular diastolic parameters are consistent with Grade I diastolic dysfunction (impaired relaxation). Right Ventricle: The  right ventricular size is normal. No increase in right ventricular wall thickness. Right ventricular systolic function is normal. Left Atrium: Left atrial size was moderately dilated. Right  Atrium: Right atrial size was normal in size. Pericardium: There is no evidence of pericardial effusion. Mitral Valve: The mitral valve is grossly normal. There is mild thickening of the mitral valve leaflet(s). There is mild calcification of the mitral valve leaflet(s). Moderate mitral annular calcification. Trivial mitral valve regurgitation. MV peak gradient, 10.0 mmHg. The mean mitral valve gradient is 3.0 mmHg. Tricuspid Valve: The tricuspid valve is normal in structure. Tricuspid valve regurgitation is trivial. Aortic Valve: The aortic valve is tricuspid. There is moderate calcification of the aortic valve. There is moderate thickening of the aortic valve. Aortic valve regurgitation is moderate. Aortic regurgitation PHT measures 251 msec. Mild aortic stenosis is present. Aortic valve mean gradient measures 11.0 mmHg. Aortic valve peak gradient measures 19.5 mmHg. Aortic valve area, by VTI measures 2.62 cm. Pulmonic Valve: The pulmonic valve was not well visualized. Pulmonic valve regurgitation is trivial. Aorta: Aortic dilatation noted. There is mild dilatation of the ascending aorta, measuring 41 mm. Venous: The inferior vena cava is dilated in size with less than 50% respiratory variability, suggesting right atrial pressure of 15 mmHg. IAS/Shunts: The atrial septum is grossly normal.  LEFT VENTRICLE PLAX 2D LVIDd:         4.10 cm         Diastology LVIDs:         2.10 cm         LV e' medial:    6.85 cm/s LV PW:         1.15 cm         LV E/e' medial:  17.7 LV IVS:        1.30 cm         LV e' lateral:   11.30 cm/s LVOT diam:     2.10 cm         LV E/e' lateral: 10.8 LV SV:         139 LV SV Index:   66              2D LVOT Area:     3.46 cm        Longitudinal                                Strain                                 2D Strain GLS  -22.6 %                                (A2C):                                2D Strain GLS  -23.4 %                                (A3C):                                2D Strain GLS  -29.2 %                                (  A4C):                                2D Strain GLS  -25.1 %                                Avg:                                 3D Volume EF                                LV 3D EF:    Left                                             ventricul                                             ar                                             ejection                                             fraction                                             by 3D                                             volume is                                             69 %.                                 3D Volume EF:                                3D EF:        69 %                                LV EDV:       129 ml  LV ESV:       40 ml                                LV SV:        90 ml RIGHT VENTRICLE RV Basal diam:  3.20 cm RV S prime:     14.00 cm/s TAPSE (M-mode): 1.9 cm LEFT ATRIUM             Index        RIGHT ATRIUM           Index LA diam:        3.80 cm 1.80 cm/m   RA Area:     18.40 cm LA Vol (A2C):   90.3 ml 42.86 ml/m  RA Volume:   46.90 ml  22.26 ml/m LA Vol (A4C):   87.6 ml 41.58 ml/m LA Biplane Vol: 96.7 ml 45.90 ml/m  AORTIC VALVE AV Area (Vmax):    2.67 cm AV Area (Vmean):   2.68 cm AV Area (VTI):     2.62 cm AV Vmax:           220.80 cm/s AV Vmean:          156.600 cm/s AV VTI:            0.529 m AV Peak Grad:      19.5 mmHg AV Mean Grad:      11.0 mmHg LVOT Vmax:         170.00 cm/s LVOT Vmean:        121.000 cm/s LVOT VTI:          0.401 m LVOT/AV VTI ratio: 0.76 AI PHT:            251 msec  AORTA Ao Root diam: 3.40 cm Ao Asc diam:  4.05 cm MITRAL VALVE                TRICUSPID VALVE MV Area (PHT)  cm          TR Peak grad:   22.5 mmHg MV  Area VTI:   3.02 cm     TR Vmax:        237.00 cm/s MV Peak grad:  10.0 mmHg MV Mean grad:  3.0 mmHg     SHUNTS MV Vmax:       1.58 m/s     Systemic VTI:  0.40 m MV Vmean:      79.6 cm/s    Systemic Diam: 2.10 cm MV Decel Time: 172 msec MV E velocity: 121.50 cm/s MV A velocity: 126.00 cm/s MV E/A ratio:  0.96 Gwyndolyn Kaufman MD Electronically signed by Gwyndolyn Kaufman MD Signature Date/Time: 08/13/2021/1:04:43 PM    Final           No data to display          No results found for: "NITRICOXIDE"      Assessment & Plan:   OSA on CPAP Good compliance and continues to receive good benefit. Encouraged to increase usage as much as possible to reach at least 70% >4 hours of use. She does wear it most nights and does not report any issues with it. Breakthrough events have improved when compared to previous download; residual AHI 2.8 on download today. Moderate leakage but is not bothering her. No adjustments made today.   Patient Instructions  Continue to use BiPAP every night, minimum of 4-6 hours a night.  Change  equipment every 30 days or as directed by DME. Wash your tubing with warm soap and water daily, hang to dry. Wash humidifier portion weekly.  Be aware of reduced alertness and do not drive or operate heavy machinery if experiencing this or drowsiness.  Exercise encouraged, as tolerated. Avoid or decrease alcohol consumption and medications that make you more sleepy, if possible. Notify if persistent daytime sleepiness occurs even with consistent use of CPAP.  Follow up in one year with Dr. Elsworth Soho. If symptoms do not improve or worsen, please contact office for sooner follow up or seek emergency care.     Morbid obesity ([place]) Down about 15 lb since previous visit. Healthy weight management discussed.   I spent 25 minutes of dedicated to the care of this patient on the date of this encounter to include pre-visit review of records, face-to-face time with the patient discussing  conditions above, post visit ordering of testing, clinical documentation with the electronic health record, making appropriate referrals as documented, and communicating necessary findings to members of the patients care team.  Clayton Bibles, NP 09/10/2021  Pt aware and understands NP's role.

## 2021-09-10 NOTE — Patient Instructions (Addendum)
Continue to use BiPAP every night, minimum of 4-6 hours a night.  Change equipment every 30 days or as directed by DME. Wash your tubing with warm soap and water daily, hang to dry. Wash humidifier portion weekly.  Be aware of reduced alertness and do not drive or operate heavy machinery if experiencing this or drowsiness.  Exercise encouraged, as tolerated. Avoid or decrease alcohol consumption and medications that make you more sleepy, if possible. Notify if persistent daytime sleepiness occurs even with consistent use of CPAP.  Follow up in one year with Dr. Elsworth Soho. If symptoms do not improve or worsen, please contact office for sooner follow up or seek emergency care.

## 2021-09-10 NOTE — Assessment & Plan Note (Addendum)
Down about 15 lb since previous visit. Healthy weight management discussed.

## 2021-09-10 NOTE — Assessment & Plan Note (Signed)
Good compliance and continues to receive good benefit. Encouraged to increase usage as much as possible to reach at least 70% >4 hours of use. She does wear it most nights and does not report any issues with it. Breakthrough events have improved when compared to previous download; residual AHI 2.8 on download today. Moderate leakage but is not bothering her. No adjustments made today.   Patient Instructions  Continue to use BiPAP every night, minimum of 4-6 hours a night.  Change equipment every 30 days or as directed by DME. Wash your tubing with warm soap and water daily, hang to dry. Wash humidifier portion weekly.  Be aware of reduced alertness and do not drive or operate heavy machinery if experiencing this or drowsiness.  Exercise encouraged, as tolerated. Avoid or decrease alcohol consumption and medications that make you more sleepy, if possible. Notify if persistent daytime sleepiness occurs even with consistent use of CPAP.  Follow up in one year with Dr. Elsworth Soho. If symptoms do not improve or worsen, please contact office for sooner follow up or seek emergency care.

## 2021-09-11 ENCOUNTER — Encounter: Payer: Self-pay | Admitting: Podiatry

## 2021-09-11 ENCOUNTER — Ambulatory Visit: Payer: Medicare HMO | Admitting: Podiatry

## 2021-09-11 DIAGNOSIS — E119 Type 2 diabetes mellitus without complications: Secondary | ICD-10-CM

## 2021-09-11 DIAGNOSIS — B351 Tinea unguium: Secondary | ICD-10-CM

## 2021-09-11 DIAGNOSIS — M79609 Pain in unspecified limb: Secondary | ICD-10-CM

## 2021-09-16 NOTE — Progress Notes (Signed)
  Subjective:  Patient ID: Rebecca Rich, female    DOB: 1945/07/22,  MRN: [id number]  Rebecca Rich presents to clinic today for preventative diabetic foot care and painful elongated mycotic toenails 1-5 bilaterally which are tender when wearing enclosed shoe gear. Pain is relieved with periodic professional debridement.  Last known HgA1c was 6.4%.  Patient does not monitor blood glucose daily.  Patient is not required to monitor blood glucose daily.  New problem(s): None.   PCP is Panosh, Standley Brooking, MD , and last visit was Jun 10, 2021.  No Known Allergies  Review of Systems: Negative except as noted in the HPI.  Objective: No changes noted in today's physical examination.  Vascular Examination: Palpable pedal pulses b/l LE. Digital hair present b/l. No pedal edema b/l. Skin temperature gradient WNL b/l. No varicosities b/l. No pain with calf compression b/l.Marland Kitchen  Dermatological Examination: Pedal skin with normal turgor, texture and tone b/l. No open wounds. No interdigital macerations b/l. Toenails 1-5 b/l thickened, discolored, dystrophic with subungual debris. There is pain on palpation to dorsal aspect of nailplates. No hyperkeratotic nor porokeratotic lesions present on today's visit.Marland Kitchen  Neurological Examination: Protective sensation intact with 10 gram monofilament b/l LE. Vibratory sensation intact b/l LE.   Musculoskeletal Examination: Muscle strength 5/5 to all LE muscle groups b/l. Pes planus deformity noted bilateral LE.     Latest Ref Rng & Units 06/10/2021   12:07 PM 11/28/2020    3:49 PM  Hemoglobin A1C  Hemoglobin-A1c 4.6 - 6.5 % 7.0  6.5    Assessment/Plan: 1. Pain due to onychomycosis of nail   2. Diabetes mellitus without complication ([place])     -Patient was evaluated and treated. All patient's and/or POA's questions/concerns answered on today's visit. -Patient to continue soft, supportive shoe gear daily. -Mycotic toenails 1-5 bilaterally were debrided in  length and girth with sterile nail nippers and dremel without incident. -Patient/POA to call should there be question/concern in the interim.   Return in about 3 months (around 12/12/2021).  Marzetta Board, DPM

## 2021-09-23 ENCOUNTER — Encounter (INDEPENDENT_AMBULATORY_CARE_PROVIDER_SITE_OTHER): Payer: Self-pay | Admitting: Ophthalmology

## 2021-09-23 ENCOUNTER — Ambulatory Visit (INDEPENDENT_AMBULATORY_CARE_PROVIDER_SITE_OTHER): Payer: Medicare HMO | Admitting: Ophthalmology

## 2021-09-23 DIAGNOSIS — H43811 Vitreous degeneration, right eye: Secondary | ICD-10-CM | POA: Diagnosis not present

## 2021-09-23 DIAGNOSIS — E113293 Type 2 diabetes mellitus with mild nonproliferative diabetic retinopathy without macular edema, bilateral: Secondary | ICD-10-CM

## 2021-09-23 DIAGNOSIS — H43812 Vitreous degeneration, left eye: Secondary | ICD-10-CM

## 2021-09-23 NOTE — Assessment & Plan Note (Signed)
Physiologic OD

## 2021-09-23 NOTE — Progress Notes (Signed)
09/23/2021     CHIEF COMPLAINT Patient presents for  Chief Complaint  Patient presents with   Diabetic Retinopathy without Macular Edema      HISTORY OF PRESENT ILLNESS: Rebecca Rich is a 76 y.o. female who presents to the clinic today for:   HPI   1 YR FU OU OCT. Pt stated vision, "I had cataract sx in both eyes performed by Dr. Herbert Deaner. One eye was done in March and the other eye was in Feb. My vision has improved since then."  Last edited by Silvestre Moment on 09/23/2021  8:22 AM.      Referring physician: Monna Fam, MD [place],  [place] [id number]  HISTORICAL INFORMATION:   Selected notes from the MEDICAL RECORD NUMBER    Lab Results  Component Value Date   HGBA1C 7.0 (H) 06/10/2021     CURRENT MEDICATIONS: No current outpatient medications on file. (Ophthalmic Drugs)   No current facility-administered medications for this visit. (Ophthalmic Drugs)   Current Outpatient Medications (Other)  Medication Sig   amLODipine (NORVASC) 10 MG tablet TAKE 1 TABLET BY MOUTH EVERY DAY   chlorthalidone (HYGROTON) 25 MG tablet TAKE 1 TABLET BY MOUTH EVERY DAY   cholecalciferol (VITAMIN D) 1000 UNITS tablet Take 1,000 Units by mouth daily.   glucose blood (ACCU-CHEK AVIVA PLUS) test strip Use to check blood sugar daily E11.9   Lancets (ACCU-CHEK SOFT TOUCH) lancets Use to test blood glucose twice daily   levothyroxine (SYNTHROID) 75 MCG tablet TAKE 1 TABLET BY MOUTH EVERY DAY BEFORE BREAKFAST   losartan (COZAAR) 100 MG tablet TAKE 1 TABLET (100 MG TOTAL) BY MOUTH DAILY. MAY BEGIN AT 1/2 TABLET DAILY AND INCREASE TO 1 TABLET   metoprolol succinate (TOPROL-XL) 25 MG 24 hr tablet TAKE 1 TABLET BY MOUTH EVERY DAY   Misc Natural Products (OSTEO BI-FLEX ADV JOINT SHIELD PO) Take 1 tablet by mouth daily.   potassium chloride (KLOR-CON) 10 MEQ tablet TAKE 1 TABLET (10 MEQ TOTAL) BY MOUTH 3 (THREE) TIMES DAILY.   No current facility-administered medications for this  visit. (Other)      REVIEW OF SYSTEMS: ROS   Negative for: Constitutional, Gastrointestinal, Neurological, Skin, Genitourinary, Musculoskeletal, HENT, Endocrine, Cardiovascular, Eyes, Respiratory, Psychiatric, Allergic/Imm, Heme/Lymph Last edited by Silvestre Moment on 09/23/2021  8:22 AM.       ALLERGIES No Known Allergies  PAST MEDICAL HISTORY 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   INFECTION, 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   LIVER FUNCTION TESTS, ABNORMAL 07/28/2008   Morbid obesity ([place]) 12/08/2006   Nuclear sclerotic cataract of both eyes 03/26/2020   OBESITY 09/24/2009   OBSTRUCTIVE SLEEP APNEA 12/08/2006   OSTEOARTHRITIS 12/08/2006   RUQ PAIN 06/16/2008   SHOULDER PAIN, RIGHT 02/07/2008   Sleep apnea    Swelling of limb 07/28/2008   THYROID FUNCTION TEST, ABNORMAL 12/08/2006   Past Surgical History:  Procedure Laterality Date   ABDOMINAL HYSTERECTOMY  CHOLECYSTECTOMY  06/27/2008   COLONOSCOPY N/A 12/13/2012   Procedure: COLONOSCOPY;  Surgeon: Juanita Craver, MD;  Location: [facility];  Service: Endoscopy;  Laterality: N/A;   COLONOSCOPY Left 02/17/2017   Procedure: COLONOSCOPY;  Surgeon: Carol Ada, MD;  Location: [facility];  Service: Endoscopy;  Laterality: Left;   common duct stone     ERCP    EYE SURGERY     LAPAROSCOPIC APPENDECTOMY N/A 12/30/2017   Procedure: APPENDECTOMY LAPAROSCOPIC;  Surgeon: Stark Klein, MD;  Location:  [facility];  Service: General;  Laterality: N/A;    FAMILY HISTORY Family History  Problem Relation Age of Onset   Other Mother        blood clots   Cervical cancer Mother    Cancer Mother    Heart disease Brother    Diabetes Brother    Heart disease Sister    Diabetes Sister    Heart disease Brother    Diabetes Brother    Heart disease Brother    Diabetes Brother    Stroke Sister    Diabetes Sister    Throat cancer Father    Cancer Father    Diabetes Other        all siblings, 4 brothers, 5 sisters    SOCIAL HISTORY Social History   Tobacco Use   Smoking status: Never   Smokeless tobacco: Never  Vaping Use   Vaping Use: Never used  Substance Use Topics   Alcohol use: No    Alcohol/week: 0.0 standard drinks of alcohol   Drug use: No         OPHTHALMIC EXAM:  Base Eye Exam     Visual Acuity (ETDRS)       Right Left   Dist cc 20/20 20/20 -1    Correction: Glasses         Tonometry (Tonopen, 8:27 AM)       Right Left   Pressure 12 9         Pupils       Pupils APD   Right PERRL None   Left PERRL None         Visual Fields       Left Right    Full Full         Extraocular Movement       Right Left    Full, Ortho Full, Ortho         Neuro/Psych     Oriented x3: Yes   Mood/Affect: Normal         Dilation     Both eyes: 1.0% Mydriacyl, 2.5% Phenylephrine @ 8:27 AM           Slit Lamp and Fundus Exam     External Exam       Right Left   External Normal Normal         Slit Lamp Exam       Right Left   Lids/Lashes Normal Normal   Conjunctiva/Sclera White and quiet White and quiet   Cornea Clear Clear   Anterior Chamber Deep and quiet Deep and quiet   Iris Round and reactive Round and reactive   Lens Centered posterior chamber intraocular lens Centered posterior chamber intraocular lens   Anterior Vitreous Normal Normal         Fundus Exam       Right Left   Posterior Vitreous Posterior vitreous  detachment Normal   Disc Normal Normal   C/D Ratio 0.1 0.1  Macula Normal, no macular thickening, no clinically significant macular edema Normal, no macular thickening, no clinically significant macular edema   Vessels NPDR- Mild NPDR- Mild   Periphery Normal Normal            IMAGING AND PROCEDURES  Imaging and Procedures for 09/23/21  OCT, Retina - OU - Both Eyes       Right Eye Quality was good. Central Foveal Thickness: 257. Progression has been stable. Findings include normal foveal contour.   Left Eye Quality was good. Scan locations included subfoveal. Central Foveal Thickness: 254. Progression has been stable. Findings include normal foveal contour.   Notes Foveal contour OU, some media opacity              ASSESSMENT/PLAN:  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) The nature of mild nonproliferative diabetic retinopathy was discussed with the patient. Emphasis was placed on tight glucose, blood pressure, and serum lipid control. Avoidance of smoking was emphasized. Maintenance of normal body weight was emphasized. Appropriate follow up dilated exam is 1 year.  Posterior vitreous detachment of left eye  The nature of posterior vitreous detachment was discussed with the patient as well as its physiology, its age prevalence, and its possible implication regarding retinal breaks and detachment.  An informational brochure was offered to the patient.  All the patient's questions were answered.  The patient was asked to return if new or different flashes or floaters develops.   Patient was instructed to contact office immediately if any new changes were noticed. I explained to the patient that vitreous inside the eye is similar to jello inside a bowl. As the jello melts it can start to pull away from the bowl, similarly the vitreous throughout our lives can begin to pull away from the retina. That process is called a posterior vitreous detachment. In  some cases, the vitreous can tug hard enough on the retina to form a retinal tear. I discussed with the patient the signs and symptoms of a retinal detachment.  Do not rub the eye.    Posterior vitreous detachment of right eye Physiologic OD     ICD-10-CM   1. Mild nonproliferative diabetic retinopathy of both eyes without macular edema associated with type 2 diabetes mellitus (HCC)  H47.4259 OCT, Retina - OU - Both Eyes    2. Posterior vitreous detachment of left eye  H43.812     3. Posterior vitreous detachment of right eye  H43.811       1.  Mild NPDR OU confirmed no progression with recent cataract surgery excellent visual acuity.  2.  Blood Sugar well controlled as patient in the midst of a nice weight loss program, using the [place]  3.  Bilateral pseudophakia  Ophthalmic Meds Ordered this visit:  No orders of the defined types were placed in this encounter.      Return in about 1 year (around 09/24/2022) for DILATE OU, COLOR FP, OCT.  There are no Patient Instructions on file for this visit.   Explained the diagnoses, plan, and follow up with the patient and they expressed understanding.  Patient expressed understanding of the importance of proper follow up care.   Clent Demark Liz Pinho M.D. Diseases & Surgery of the Retina and Vitreous [facility] 09/23/21     Abbreviations: M myopia (nearsighted); A astigmatism; H hyperopia (farsighted); P presbyopia; Mrx spectacle prescription;  CTL contact lenses; OD right eye; OS left eye; OU both eyes  XT exotropia; ET esotropia; PEK punctate epithelial keratitis; PEE punctate epithelial erosions; DES dry eye syndrome; MGD meibomian gland dysfunction; ATs artificial tears; PFAT's preservative free artificial tears; [place] nuclear sclerotic cataract; PSC posterior subcapsular cataract; ERM epi-retinal membrane; PVD posterior vitreous detachment; RD retinal detachment; DM diabetes mellitus; DR diabetic  retinopathy; NPDR non-proliferative diabetic retinopathy; PDR proliferative diabetic retinopathy; CSME clinically significant macular edema; DME diabetic macular edema; dbh dot blot hemorrhages; CWS cotton wool spot; POAG primary open angle glaucoma; C/D cup-to-disc ratio; HVF humphrey visual field; GVF goldmann visual field; OCT optical coherence tomography; IOP intraocular pressure; BRVO Branch retinal vein occlusion; CRVO central retinal vein occlusion; CRAO central retinal artery occlusion; BRAO branch retinal artery occlusion; RT retinal tear; SB scleral buckle; PPV pars plana vitrectomy; VH Vitreous hemorrhage; PRP panretinal laser photocoagulation; IVK intravitreal kenalog; VMT vitreomacular traction; MH Macular hole;  NVD neovascularization of the disc; NVE neovascularization elsewhere; AREDS age related eye disease study; ARMD age related macular degeneration; POAG primary open angle glaucoma; EBMD epithelial/anterior basement membrane dystrophy; ACIOL anterior chamber intraocular lens; IOL intraocular lens; PCIOL posterior chamber intraocular lens; Phaco/IOL phacoemulsification with intraocular lens placement; [place] photorefractive keratectomy; LASIK laser assisted in situ keratomileusis; HTN hypertension; DM diabetes mellitus; COPD chronic obstructive pulmonary disease

## 2021-09-23 NOTE — Assessment & Plan Note (Signed)

## 2021-09-23 NOTE — Assessment & Plan Note (Signed)
The nature of mild nonproliferative diabetic retinopathy was discussed with the patient. Emphasis was placed on tight glucose, blood pressure, and serum lipid control. Avoidance of smoking was emphasized. Maintenance of normal body weight was emphasized. Appropriate follow up dilated exam is 1 year. 

## 2021-09-23 NOTE — Patient Instructions (Signed)
The nature of diabetic retinopathy was explained using the following analogy: "Retinopathy develops in the body's blood supply like salty water corrodes the linings of pipes in a house, until rust appears, then holes in the pipes develop which leak followed by destruction and loss of the pipes as the corrosion turns them to dust. In a similar fashion, Diabetes damages the blood supply of the body by cumulative long--term elevated blood sugar, which corrodes the blood supply in the body, particularly the blood vessels supplying the retina, kidneys, and nerves".  Thus, control of blood sugar, slows the progression of the corrosive effect of diabetes mellitus.

## 2021-10-08 ENCOUNTER — Ambulatory Visit: Payer: Medicare HMO | Admitting: Internal Medicine

## 2021-10-15 ENCOUNTER — Ambulatory Visit (INDEPENDENT_AMBULATORY_CARE_PROVIDER_SITE_OTHER): Payer: Medicare HMO | Admitting: Internal Medicine

## 2021-10-15 ENCOUNTER — Encounter: Payer: Self-pay | Admitting: Internal Medicine

## 2021-10-15 VITALS — BP 120/50 | HR 72 | Temp 98.5°F | Wt 251.8 lb

## 2021-10-15 DIAGNOSIS — E1169 Type 2 diabetes mellitus with other specified complication: Secondary | ICD-10-CM | POA: Diagnosis not present

## 2021-10-15 DIAGNOSIS — I10 Essential (primary) hypertension: Secondary | ICD-10-CM

## 2021-10-15 DIAGNOSIS — E669 Obesity, unspecified: Secondary | ICD-10-CM | POA: Diagnosis not present

## 2021-10-15 DIAGNOSIS — Z79899 Other long term (current) drug therapy: Secondary | ICD-10-CM | POA: Diagnosis not present

## 2021-10-15 DIAGNOSIS — Z6841 Body Mass Index (BMI) 40.0 and over, adult: Secondary | ICD-10-CM | POA: Diagnosis not present

## 2021-10-15 LAB — POCT GLYCOSYLATED HEMOGLOBIN (HGB A1C): Hemoglobin A1C: 6.3 % — AB (ref 4.0–5.6)

## 2021-10-15 NOTE — Progress Notes (Signed)
Chief Complaint  Patient presents with   Follow-up    HPI: Rebecca Rich 76 y.o. come in for Chronic disease management  Diabetes: She has been on the [place] plan once a month it helps direct shopping cooking portion control and lifestyle and is pleased with this.  She is also begun in the last 2 months her water aerobics which has been helpful no travel. Her goal is to lose up to 100 pounds. She is cut out a number of adverse foods and feels good about the program. Is seeing the podiatrist on a regular basis no other major changes in health. Pressures been in control Thyroid no change ROS: See pertinent positives and negatives per HPI.  No new cardiovascular or pulmonary symptoms Mild as, osa on cpapc  retinopathy   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   INFECTION, 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   LIVER FUNCTION TESTS, ABNORMAL 07/28/2008   Morbid obesity ([place]) 12/08/2006   Nuclear sclerotic cataract of both eyes 03/26/2020   OBESITY 09/24/2009   OBSTRUCTIVE SLEEP APNEA 12/08/2006   OSTEOARTHRITIS 12/08/2006   RUQ PAIN 06/16/2008   SHOULDER PAIN, RIGHT 02/07/2008   Sleep apnea    Swelling of limb 07/28/2008   THYROID FUNCTION TEST, ABNORMAL 12/08/2006    Family History  Problem Relation Age of Onset   Other Mother        blood clots   Cervical cancer Mother    Cancer Mother    Heart disease Brother    Diabetes Brother     Heart disease Sister    Diabetes Sister    Heart disease Brother    Diabetes Brother    Heart disease Brother    Diabetes Brother    Stroke Sister    Diabetes Sister    Throat cancer Father    Cancer Father    Diabetes Other        all siblings, 4 brothers, 5 sisters    Social History   Socioeconomic History   Marital status: Divorced    Spouse name: Not on file   Number of children: Not on file   Years of education: Not on file   Highest education level: Master's degree (e.g., MA, MS, MEng, MEd, MSW, MBA)  Occupational History   Occupation: retired    Fish farm manager: RETIRED  Tobacco Use   Smoking status: Never   Smokeless tobacco: Never  Vaping Use   Vaping Use: Never used  Substance and Sexual Activity   Alcohol use: No    Alcohol/week: 0.0 standard drinks of alcohol   Drug use: No   Sexual activity: Not on file  Other Topics Concern   Not on file  Social History Narrative   Master level education in math   Pt is currently retired   Pt is divorced with children   Recently had to move had a break in and thus away from her pool exercise   Social  Determinants of Health   Financial Resource Strain: Low Risk  (06/10/2021)   Overall Financial Resource Strain (CARDIA)    Difficulty of Paying Living Expenses: Not hard at all  Food Insecurity: No Food Insecurity (06/10/2021)   Hunger Vital Sign    Worried About Running Out of Food in the Last Year: Never true    Ran Out of Food in the Last Year: Never true  Transportation Needs: No Transportation Needs (06/10/2021)   PRAPARE - Hydrologist (Medical): No    Lack of Transportation (Non-Medical): No  Physical Activity: Insufficiently Active (06/10/2021)   Exercise Vital Sign    Days of Exercise per Week: 3 days    Minutes of Exercise per Session: 40 min  Stress: No Stress Concern Present (06/10/2021)   [place]    Feeling of  Stress : Not at all  Social Connections: Moderately Integrated (06/10/2021)   Social Connection and Isolation Panel [NHANES]    Frequency of Communication with Friends and Family: More than three times a week    Frequency of Social Gatherings with Friends and Family: Three times a week    Attends Religious Services: More than 4 times per year    Active Member of Clubs or Organizations: Yes    Attends Music therapist: More than 4 times per year    Marital Status: Divorced    Outpatient Medications Prior to Visit  Medication Sig Dispense Refill   amLODipine (NORVASC) 10 MG tablet TAKE 1 TABLET BY MOUTH EVERY DAY 90 tablet 3   chlorthalidone (HYGROTON) 25 MG tablet TAKE 1 TABLET BY MOUTH EVERY DAY 90 tablet 0   cholecalciferol (VITAMIN D) 1000 UNITS tablet Take 1,000 Units by mouth daily.     glucose blood (ACCU-CHEK AVIVA PLUS) test strip Use to check blood sugar daily E11.9 100 each 12   Lancets (ACCU-CHEK SOFT TOUCH) lancets Use to test blood glucose twice daily 50 each 12   levothyroxine (SYNTHROID) 75 MCG tablet TAKE 1 TABLET BY MOUTH EVERY DAY BEFORE BREAKFAST 90 tablet 1   losartan (COZAAR) 100 MG tablet TAKE 1 TABLET (100 MG TOTAL) BY MOUTH DAILY. MAY BEGIN AT 1/2 TABLET DAILY AND INCREASE TO 1 TABLET 90 tablet 1   metoprolol succinate (TOPROL-XL) 25 MG 24 hr tablet TAKE 1 TABLET BY MOUTH EVERY DAY 30 tablet 10   Misc Natural Products (OSTEO BI-FLEX ADV JOINT SHIELD PO) Take 1 tablet by mouth daily.     potassium chloride (KLOR-CON) 10 MEQ tablet TAKE 1 TABLET (10 MEQ TOTAL) BY MOUTH 3 (THREE) TIMES DAILY. 90 tablet 5   No facility-administered medications prior to visit.     EXAM:  BP (!) 120/50 (BP Location: Right Arm, Patient Position: Sitting, Cuff Size: Large)   Pulse 72   Temp 98.5 F (36.9 C) (Oral)   Wt 251 lb 12.8 oz (114.2 kg)   SpO2 97%   BMI 47.58 kg/m   Body mass index is 47.58 kg/m. Wt Readings from Last 3 Encounters:  10/15/21 251 lb 12.8 oz  (114.2 kg)  09/10/21 256 lb 3.2 oz (116.2 kg)  07/24/21 258 lb 9.6 oz (117.3 kg)    GENERAL: vitals reviewed and listed above, alert, oriented, appears well hydrated and in no acute distress HEENT: atraumatic, conjunctiva  clear, no obvious abnormalities on inspection of external nose and ears NECK: no obvious masses on inspection palpation  LUNGS: clear to auscultation bilaterally, no  wheezes, rales or rhonchi, good air movement CV: HRRR, soft 2/6 systolic murmur upper sternal border no clubbing cyanosis or  peripheral edema nl cap refill  MS: moves all extremities without noticeable focal  abnormality feet without ulceration or serious callus. PSYCH: pleasant and cooperative, no obvious depression or anxiety Lab Results  Component Value Date   WBC 4.6 06/10/2021   HGB 11.8 (L) 06/10/2021   HCT 35.5 (L) 06/10/2021   PLT 153.0 06/10/2021   GLUCOSE 107 (H) 06/10/2021   CHOL 134 06/10/2021   TRIG 105.0 06/10/2021   HDL 50.90 06/10/2021   LDLCALC 62 06/10/2021   ALT 12 06/10/2021   AST 12 06/10/2021   NA 139 06/10/2021   K 3.8 06/10/2021   CL 107 06/10/2021   CREATININE 1.22 (H) 06/10/2021   BUN 26 (H) 06/10/2021   CO2 24 06/10/2021   TSH 3.62 06/10/2021   HGBA1C 6.3 (A) 10/15/2021   MICROALBUR 1.2 06/10/2021   BP Readings from Last 3 Encounters:  10/15/21 (!) 120/50  09/10/21 120/60  07/24/21 (!) 110/52    ASSESSMENT AND PLAN:  Discussed the following assessment and plan:  Diabetes mellitus type 2 in obese ([place]) - 1C significantly improved continue working on weight loss she has found a program this been helpful for her to continue - Plan: POC HgB A1c  Medication management  Essential hypertension  BMI 45.0-49.9, adult ([place]) Declines flu shot today She is pleased with her results Even if her numbers are good could consider SGLT2 based on her renal function can discuss this in the future although she is usually reluctant to add medication unless absolutely  necessary.  -Patient advised to return or notify health care team  if  new concerns arise.  Patient Instructions  Improved blood sugar a1c is now 6.3 !  In prediabetic range   down from 7.0  Continue lifestyle intervention healthy eating and activity.  Continue.  Through holiday season.    Standley Brooking. Jazsmine Macari M.D.

## 2021-10-15 NOTE — Patient Instructions (Addendum)
Improved blood sugar a1c is now 6.3 !  In prediabetic range   down from 7.0  Continue lifestyle intervention healthy eating and activity.  Continue.  Through holiday season.

## 2021-10-30 ENCOUNTER — Other Ambulatory Visit: Payer: Self-pay | Admitting: Internal Medicine

## 2021-12-05 ENCOUNTER — Other Ambulatory Visit: Payer: Self-pay | Admitting: Internal Medicine

## 2021-12-25 ENCOUNTER — Ambulatory Visit (INDEPENDENT_AMBULATORY_CARE_PROVIDER_SITE_OTHER): Payer: Medicare HMO

## 2021-12-25 ENCOUNTER — Encounter: Payer: Self-pay | Admitting: Podiatry

## 2021-12-25 ENCOUNTER — Ambulatory Visit: Payer: Medicare HMO | Admitting: Podiatry

## 2021-12-25 DIAGNOSIS — R609 Edema, unspecified: Secondary | ICD-10-CM | POA: Diagnosis not present

## 2021-12-25 DIAGNOSIS — M79609 Pain in unspecified limb: Secondary | ICD-10-CM | POA: Diagnosis not present

## 2021-12-25 DIAGNOSIS — E119 Type 2 diabetes mellitus without complications: Secondary | ICD-10-CM | POA: Diagnosis not present

## 2021-12-25 DIAGNOSIS — R194 Change in bowel habit: Secondary | ICD-10-CM | POA: Insufficient documentation

## 2021-12-25 DIAGNOSIS — B351 Tinea unguium: Secondary | ICD-10-CM | POA: Diagnosis not present

## 2021-12-25 DIAGNOSIS — M10071 Idiopathic gout, right ankle and foot: Secondary | ICD-10-CM | POA: Diagnosis not present

## 2021-12-25 DIAGNOSIS — M109 Gout, unspecified: Secondary | ICD-10-CM

## 2021-12-25 DIAGNOSIS — Z1211 Encounter for screening for malignant neoplasm of colon: Secondary | ICD-10-CM | POA: Insufficient documentation

## 2021-12-25 MED ORDER — COLCHICINE 0.6 MG PO TABS: ORAL_TABLET | ORAL | 0 refills | Status: DC

## 2021-12-25 NOTE — Patient Instructions (Addendum)
Gout  Gout is painful swelling of your joints. Gout is a type of arthritis. It is caused by having too much uric acid in your body. Uric acid is a chemical that is made when your body breaks down substances called purines. If your body has too much uric acid, sharp crystals can form and build up in your joints. This causes pain and swelling. Gout attacks can happen quickly and be very painful (acute gout). Over time, the attacks can affect more joints and happen more often (chronic gout). What are the causes? Gout is caused by too much uric acid in your blood. This can happen because: Your kidneys do not remove enough uric acid from your blood. Your body makes too much uric acid. You eat too many foods that are high in purines. These foods include organ meats, some seafood, and beer. Trauma or stress can bring on an attack. What increases the risk? Having a family history of gout. Being female and middle-aged. Being female and having gone through menopause. Having an organ transplant. Taking certain medicines. Having certain conditions, such as: Being very overweight (obese). Lead poisoning. Kidney disease. A skin condition called psoriasis. Other risks include: Losing weight too quickly. Not having enough water in the body (being dehydrated). Drinking alcohol, especially beer. Drinking beverages that are sweetened with a type of sugar called fructose. What are the signs or symptoms? An attack of acute gout often starts at night and usually happens in just one joint. The most common place is the big toe. Other joints that may be affected include joints of the feet, ankle, knee, fingers, wrist, or elbow. Symptoms may include: Very bad pain. Warmth. Swelling. Stiffness. Tenderness. The affected joint may be very painful to touch. Shiny, red, or purple skin. Chills and fever. Chronic gout may cause symptoms more often. More joints may be involved. You may also have white or yellow lumps  (tophi) on your hands or feet or in other areas near your joints. How is this treated? Treatment for an acute attack may include medicines for pain and swelling, such as: NSAIDs, such as ibuprofen. Steroids taken by mouth or injected into a joint. Colchicine. This can be given by mouth or through an IV tube. Treatment to prevent future attacks may include: Taking small doses of NSAIDs or colchicine daily. Using a medicine that reduces uric acid levels in your blood, such as allopurinol. Making changes to your diet. You may need to see a food expert (dietitian) about what to eat and drink to prevent gout. Follow these instructions at home: During a gout attack  If told, put ice on the painful area. To do this: Put ice in a plastic bag. Place a towel between your skin and the bag. Leave the ice on for 20 minutes, 2-3 times a day. Take off the ice if your skin turns bright red. This is very important. If you cannot feel pain, heat, or cold, you have a greater risk of damage to the area. Raise the painful joint above the level of your heart as often as you can. Rest the joint as much as possible. If the joint is in your leg, you may be given crutches. Follow instructions from your doctor about what you cannot eat or drink. Avoiding future gout attacks Eat a low-purine diet. Avoid foods and drinks such as: Liver. Kidney. Anchovies. Asparagus. Herring. Mushrooms. Mussels. Beer. Stay at a healthy weight. If you want to lose weight, talk with your doctor. Do not   lose weight too fast. Start or continue an exercise plan as told by your doctor. Eating and drinking Avoid drinks sweetened by fructose. Drink enough fluids to keep your pee (urine) pale yellow. If you drink alcohol: Limit how much you have to: 0-1 drink a day for women who are not pregnant. 0-2 drinks a day for men. Know how much alcohol is in a drink. In the [place] one drink equals one 12 oz bottle of beer (355 mL), one 5 oz  glass of wine (148 mL), or one 1 oz glass of hard liquor (44 mL). General instructions Take over-the-counter and prescription medicines only as told by your doctor. Ask your doctor if you should avoid driving or using machines while you are taking your medicine. Return to your normal activities when your doctor says that it is safe. Keep all follow-up visits. Where to find more information [employer] [facility]: www.niams.SouthExposed.es Contact a doctor if: You have another gout attack. You still have symptoms of a gout attack after 10 days of treatment. You have problems (side effects) because of your medicines. You have chills or a fever. You have burning pain when you pee (urinate). You have pain in your lower back or belly. Get help right away if: You have very bad pain. Your pain cannot be controlled. You cannot pee. Summary Gout is painful swelling of the joints. The most common site of pain is the big toe, but it can affect other joints. Medicines and avoiding some foods can help to prevent and treat gout attacks. This information is not intended to replace advice given to you by your health care provider. Make sure you discuss any questions you have with your health care provider. Document Revised: 10/17/2020 Document Reviewed: 10/17/2020 Elsevier Patient Education  [place] for patients with Gout  Gout defined-Gout occurs when urate crystals accumulate in your joint causing the inflammation and intense pain of gout attack.  Urate crystals can form when you have high levels of uric acid in your blood.  Your body produces uric acid when it breaks down prurines-substances that are found naturally in your body, as well as in certain foods such as organ meats, anchioves, herring, asparagus, and mushrooms.  Normally uric acid dissolves in your blood and passes through your kidneys into your urine.  But sometimes your body either produces too much uric  acid or your kidneys excrete too little uric acid.  When this happens, uric acid can build up, forming sharp needle-like urate crystals in a joint or surrounding tissue that cause pain, inflammation and swelling.    Gout is characterized by sudden, severe attacks of pain, redness and tenderness in joints, often the joint at the base of the big toe.  Gout is complex form of arthritis that can affect anyone.  Men are more likely to get gout but women become increasingly more susceptible to gout after menopause.  An acute attack of gout can wake you up in the middle of the night with the sensation that your big toe is on fire.  The affected joint is hot, swollen and so tender that even the weight or the sheet on it may seem intolerable.  If you experience symptoms of an acute gout attack it is important to your doctor as soon as the symptoms start.  Gout that goes untreated can lead to worsening pain and joint damage.  Risk Factors:  You are more likely to develop gout if you have high  levels of uric acid in your body.    Factors that increase the uric acid level in your body include:  Lifestyle factors.  Excessive alcohol use-generally more than two drinks a day for men and more than one for women increase the risk of gout.  Medical conditions.  Certain conditions make it more likely that you will develop gout.  These include hypertension, and chronic conditions such as diabetes, high levels of fat and cholesterol in the blood, and narrowing of the arteries.  Certain medications.  The uses of Thiazide diuretics- commonly used to treat hypertension and low dose aspirin can also increase uric acid levels.  Family history of gout.  If other members of your family have had gout, you are more likely to develop the disease.  Age and sex. Gout occurs more often in men than it does in women, primarily because women tend to have lower uric acid levels than men do.  Men are more likely to develop gout  earlier usually between the ages of 74-50- whereas women generally develop signs and symptoms after menopause.    Tests and diagnosis:  Tests to help diagnose gout may include:  Blood test.  Your doctor may recommend a blood test to measure the uric acid level in your blood .  Blood tests can be misleading, though.  Some people have high uric acid levels but never experience gout.  And some people have signs and symptoms of gout, but don't have unusual levels of uric acid in their blood.  Joint fluid test.  Your doctor may use a needle to draw fluid from your affected joint.  When examined under the microscope, your joint fluid may reveal urate crystals.  Treatment:  Treatment for gout usually involves medications.  What medications you and your doctor choose will be based on your current health and other medications you currently take.  Gout medications can be used to treat acute gout attacks and prevent future attacks as well as reduce your risk of complications from gout such as the development of tophi from urate crystal deposits.  Alternative medicine:   Certain foods have been studied for their potential to lower uric acid levels, including:  Coffee.  Studies have found an association between coffee drinking (regular and decaf) and lower uric acid levels.  The evidence is not enough to encourage non-coffee drinkers to start, but it may give clues to new ways of treating gout in the future.  Vitamin C.  Supplements containing vitamin C may reduce the levels of uric acid in your blood.  However, vitamin as a treatment for gout. Don't assume that if a little vitamin C is good, than lots is better.  Megadoses of vitamin C may increase your bodies uric acid levels.  Cherries.  Cherries have been associated with lower levels of uric acid in studies, but it isn't clear if they have any effect on gout signs and symptoms.  Eating more cherries and other dar-colored fruits, such as blackberries,  blueberries, purple grapes and raspberries, may be a safe way to support your gout treatment.    Lifestyle/Diet Recommendations:  Drink 8 to 16 cups ( about 2 to 4 liters) of fluid each day, with at least half being water. Avoid alcohol Eat a moderate amount of protein, preferably from healthy sources, such as low-fat or fat-free dairy, tofu, eggs, and nut butters. Limit you daily intake of meat, fish, and poultry to 4 to 6 ounces. Avoid high fat meats and desserts. Decrease  you intake of shellfish, beef, lamb, pork, eggs and cheese. Choose a good source of vitamin C daily such as citrus fruits, strawberries, broccoli,  brussel sprouts, papaya, and cantaloupe.  Choose a good source of vitamin A every other day such as yellow fruits, or dark green/yellow vegetables. Avoid drastic weigh reduction or fasting.  If weigh loss is desired lose it over a period of several months. See "dietary considerations.." chart for specific food recommendations.  Dietary Considerations for people with Gout  Food with negligible purine content (0-15 mg of purine nitrogen per 100 grams food)  May use as desired except on calorie variations  Non fat milk Cocoa Cereals (except in list II) Hard candies  Buttermilk Carbonated drinks Vegetables (except in list II) Sherbet  Coffee Fruits Sugar Honey  Tea Cottage Cheese Gelatin-jell-o Salt  Fruit juice Breads Angel food Cake   Herbs/spices Jams/Jellies [employer]    Foods that do not contain excessive purine content, but must be limited due to fat content  Cream Eggs Oil and Salad Dressing  Half and Half Peanut Butter Chocolate  Whole Milk Cakes Potato Chips  Butter Ice Cream Fried Foods  Cheese Nuts Waffles, pancakes   List II: Food with moderate purine content (50-150 mg of purine nitrogen per 100 grams of food)  Limit total amount each day to 5 oz. cooked Lean meat, other than those on list III   Poultry, other than those on list  III Fish, other than those on list III   Seafood, other than those on list III  These foods may be used occasionally  Peas Lentils Bran  Spinach Oatmeal Dried Beans and Peas  Asparagus Wheat Germ Mushrooms   Additional information about meat choices  Choose fish and poultry, particularly without skin, often.  Select lean, well trimmed cuts of meat.  Avoid all fatty meats, bacon , sausage, fried meats, fried fish, or poultry, luncheon meats, cold cuts, hot dogs, meats canned or frozen in gravy, spareribs and frozen and packaged prepared meats.   List III: Foods with HIGH purine content / Foods to AVOID (150-800 mg of purine nitrogen per 100 grams of food)  Anchovies Herring Meat Broths  Liver Mackerel Meat Extracts  Kidney Scallops Meat Drippings  Sardines Wild Game Mincemeat  Sweetbreads Goose Gravy  Heart Tongue Yeast, baker's and brewers   Commercial soups made with any of the foods listed in List II or List III  In addition avoid all alcoholic beverages

## 2021-12-26 NOTE — Progress Notes (Signed)
Subjective:  Patient ID: Rebecca Rich, female    DOB: [birth year],  MRN: [id number]  Rebecca Rich presents to clinic today for preventative diabetic foot care and painful elongated mycotic toenails 1-5 bilaterally which are tender when wearing enclosed shoe gear. Pain is relieved with periodic professional debridement.  Chief Complaint  Patient presents with   Nail Problem    Diabetic foot care BS-did not check today A1C-6.3 PCP-Panosh PCP VST-03/2025   New problem(s):  Patient presents with h/o painful swollen right ankle which started after Thanksgiving. She relates it is hard for her to bear weight on her foot now. Denies any preceding episode of trauma or a fall. Patient applied ice and a pain patch to the ankle and stated it improved, but pain then moved to her foot. She denies any h/o gout, but chart does have a noted h/o gout. Patient is wearing a Darco shoe she was given years ago by Dr. Amalia Hailey. She does relate Thanksgiving dinner consisting of [place], dressing, macaroni and cheese and greens  PCP is Panosh, Standley Brooking, MD.  No Known Allergies  Review of Systems: Negative except as noted in the HPI.  Objective: No changes noted in today's physical examination.  Rebecca Rich is a pleasant 76 y.o. female morbidly obese in NAD. AAO x 3.  Vascular Examination: Capillary refill time immediate b/l.Vascular status intact b/l with palpable pedal pulses. Pedal hair present b/l. No pain with calf compression b/l. Skin temperature gradient WNL LLE. Trace edema noted left lower extremity. +1 pitting edema right lower extremity.  Neurological Examination: Sensation grossly intact b/l with 10 gram monofilament. Vibratory sensation intact b/l. Protective sensation intact 5/5 intact bilaterally with 10g monofilament b/l. Vibratory sensation intact b/l.  Dermatological Examination: Pedal skin with normal turgor, texture and tone b/l. Toenails 1-5 b/l thick, discolored, elongated with  subungual debris and pain on dorsal palpation. No open wounds b/l LE. No interdigital macerations noted b/l LE.  Musculoskeletal Examination: Muscle strength 5/5 to all lower extremity muscle groups bilaterally. Pes planus deformity noted bilateral LE. Antalgic gait noted. Utilizes walker for ambulation assistance.  Radiographs: None  No gas in tissues right foot and right ankle. Soft tissue swelling present right lower extremity. Plantar calcaneal spur noted right heel. Posterior calcaneal spur noted right heel. Osteophyte noted dorsomedial column of right foot as well as medial talotibial joint. Pes planus foot deformity right foot.  Last A1c:      Latest Ref Rng & Units 10/15/2021   10:18 AM 06/10/2021   12:07 PM  Hemoglobin A1C  Hemoglobin-A1c 4.0 - 5.6 % 6.3  7.0    Assessment/Plan: 1. Pain due to onychomycosis of nail   2. Swelling   3. Acute idiopathic gout of right ankle   4. Acute gout of right foot, unspecified cause   5. Diabetes mellitus without complication ([place])   6. Encounter for diabetic foot exam ([place])     Meds ordered this encounter  Medications   colchicine 0.6 MG tablet    Sig: Take initial dose of 2 tablets tonight. Days 2-13, take one tablet per day.    Dispense:  15 tablet    Refill:  0    -Patient was evaluated and treated. All patient's and/or POA's questions/concerns answered on today's visit. -Examined patient. -Discussed her gout diagnosis. Dr. Daylene Katayama reviewed her xrays. Reviewed xrays with patient in office. Continue wearing Darco shoe until she can tolerate regular shoe gear. -Sent Rx for colchicine 0.6 mg tablets. Dispense  15 tablets: Day one: Take two tablets as initial dose. Take one tablet per day for days 2-14. She will call if she has any adverse effects. Follow up with Dr. Amalia Hailey in about 3 weeks. -Toenails 1-5 b/l were debrided in length and girth with sterile nail nippers and dremel without iatrogenic bleeding.  -Follow up with me in  3 months for her diabetic foot care. -Patient/POA to call should there be question/concern in the interim.   Return in about 3 months (around 03/27/2022).  Marzetta Board, DPM

## 2022-01-15 ENCOUNTER — Ambulatory Visit: Payer: Medicare HMO | Admitting: Podiatry

## 2022-02-07 ENCOUNTER — Ambulatory Visit: Payer: Medicare HMO | Admitting: Podiatry

## 2022-02-07 VITALS — BP 145/49 | HR 80

## 2022-02-07 DIAGNOSIS — M10071 Idiopathic gout, right ankle and foot: Secondary | ICD-10-CM

## 2022-02-07 NOTE — Progress Notes (Signed)
Chief Complaint  Patient presents with   Gout    Acute idiopathic gout of right ankle and swelling 3 month follow-up     HPI: 77 y.o. female presenting today for follow-up evaluation of right ankle pain specifically.  Patient states that she had an acute onset of gout where her right ankle was red and swollen and very sore.  She was seen here in the office 12/25/2021 with Dr. Adah Perl and at that time she received a cortisone injection in her right ankle.  Patient states that currently she feels great with no pain or tenderness.  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   INFECTION, 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   LIVER FUNCTION TESTS, ABNORMAL 07/28/2008   Morbid obesity ([place]) 12/08/2006   Nuclear sclerotic cataract of both eyes 03/26/2020   OBESITY 09/24/2009   OBSTRUCTIVE SLEEP APNEA 12/08/2006   OSTEOARTHRITIS 12/08/2006   RUQ PAIN 06/16/2008   SHOULDER PAIN, RIGHT 02/07/2008   Sleep apnea    Swelling of limb 07/28/2008   THYROID FUNCTION TEST, ABNORMAL 12/08/2006    Past Surgical History:  Procedure Laterality Date   ABDOMINAL HYSTERECTOMY     CHOLECYSTECTOMY  06/27/2008   COLONOSCOPY N/A 12/13/2012   Procedure: COLONOSCOPY;  Surgeon: Juanita Craver, MD;  Location: [facility];  Service: Endoscopy;  Laterality: N/A;   COLONOSCOPY Left 02/17/2017   Procedure: COLONOSCOPY;  Surgeon: Carol Ada, MD;  Location: [facility];  Service:  Endoscopy;  Laterality: Left;   common duct stone     ERCP    EYE SURGERY     LAPAROSCOPIC APPENDECTOMY N/A 12/30/2017   Procedure: APPENDECTOMY LAPAROSCOPIC;  Surgeon: Stark Klein, MD;  Location: [facility];  Service: General;  Laterality: N/A;    No Known Allergies   Physical Exam: General: The patient is alert and oriented x3 in no acute distress.  Dermatology: Skin is warm, dry and supple bilateral lower extremities. Negative for open lesions or macerations.  Vascular: Palpable pedal pulses bilaterally. Capillary refill within normal limits.  Chronic lymphedema noted bilateral lower extremities.  No erythema around the ankle  Neurological: Light touch and protective threshold grossly intact  Musculoskeletal Exam: No pedal deformities noted.  No pain on palpation to the right ankle  Assessment: 1.  Acute onset of gout right ankle; resolved -Patient evaluated -Recommend avoiding foods that elicit gout -Continue routine foot care with Dr. Adah Perl.  She has an appointment in a few months -Return to clinic with me as needed      Edrick Kins, DPM [facility]  Dr. Edrick Kins, DPM    [address], [place] [address]  Office [phone]  Fax [phone]

## 2022-02-18 ENCOUNTER — Ambulatory Visit: Payer: Medicare HMO | Admitting: Internal Medicine

## 2022-02-24 ENCOUNTER — Other Ambulatory Visit: Payer: Self-pay | Admitting: Internal Medicine

## 2022-03-05 ENCOUNTER — Other Ambulatory Visit: Payer: Self-pay | Admitting: Internal Medicine

## 2022-03-20 ENCOUNTER — Other Ambulatory Visit: Payer: Self-pay | Admitting: Internal Medicine

## 2022-03-24 ENCOUNTER — Ambulatory Visit (INDEPENDENT_AMBULATORY_CARE_PROVIDER_SITE_OTHER): Payer: Medicare HMO

## 2022-03-24 VITALS — Ht 61.0 in | Wt 251.0 lb

## 2022-03-24 DIAGNOSIS — Z0000 Encounter for general adult medical examination without abnormal findings: Secondary | ICD-10-CM | POA: Diagnosis not present

## 2022-03-24 DIAGNOSIS — Z13820 Encounter for screening for osteoporosis: Secondary | ICD-10-CM | POA: Diagnosis not present

## 2022-03-24 NOTE — Progress Notes (Signed)
Subjective:   Rebecca Rich is a 77 y.o. female who presents for Medicare Annual (Subsequent) preventive examination.  Review of Systems    Virtual Visit via Telephone Note  I connected with  Rebecca Rich on 03/24/22 at 11:30 AM EST by telephone and verified that I am speaking with the correct person using two identifiers.  Location: Patient: Home Provider: Office Persons participating in the virtual visit: patient/Nurse Health Advisor   I discussed the limitations, risks, security and privacy concerns of performing an evaluation and management service by telephone and the availability of in person appointments. The patient expressed understanding and agreed to proceed.  Interactive audio and video telecommunications were attempted between this nurse and patient, however failed, due to patient having technical difficulties OR patient did not have access to video capability.  We continued and completed visit with audio only.  Some vital signs may be absent or patient reported.   Criselda Peaches, LPN  Cardiac Risk Factors include: advanced age (>81mn, >>62women);diabetes mellitus;hypertension     Objective:    Today's Vitals   03/24/22 1136  Weight: 251 lb (113.9 kg)  Height: '5\' 1"'$  (1.549 m)   Body mass index is 47.43 kg/m.     03/24/2022   11:42 AM 03/21/2021   11:21 AM 03/07/2020    2:05 PM 06/29/2018    8:17 PM 12/30/2017   12:47 PM 12/30/2017   10:18 AM 02/17/2017    1:00 AM  Advanced Directives  Does Patient Have a Medical Advance Directive? Yes Yes No Yes No No Yes  Type of [place]of [place] will [place] will  [place]  Does patient want to make changes to medical advance directive?  No - Patient declined  No - Patient declined   No - Patient declined  Copy of [place] Chart? No - copy requested No - copy requested  No - copy requested     Would  patient like information on creating a medical advance directive?   Yes (MAU/Ambulatory/Procedural Areas - Information given) No - Patient declined No - Patient declined  No - Patient declined    Current Medications (verified) Outpatient Encounter Medications as of 03/24/2022  Medication Sig   amLODipine (NORVASC) 10 MG tablet TAKE 1 TABLET BY MOUTH EVERY DAY   chlorthalidone (HYGROTON) 25 MG tablet TAKE 1 TABLET BY MOUTH EVERY DAY   cholecalciferol (VITAMIN D) 1000 UNITS tablet Take 1,000 Units by mouth daily.   colchicine 0.6 MG tablet Take initial dose of 2 tablets tonight. Days 2-13, take one tablet per day.   glucose blood (ACCU-CHEK AVIVA PLUS) test strip Use to check blood sugar daily E11.9   Lancets (ACCU-CHEK SOFT TOUCH) lancets Use to test blood glucose twice daily   levothyroxine (SYNTHROID) 75 MCG tablet TAKE 1 TABLET BY MOUTH EVERY DAY BEFORE BREAKFAST   losartan (COZAAR) 100 MG tablet TAKE 1 TABLET (100 MG TOTAL) BY MOUTH DAILY. MAY BEGIN AT 1/2 TABLET DAILY AND INCREASE TO 1 TABLET   metoprolol succinate (TOPROL-XL) 25 MG 24 hr tablet TAKE 1 TABLET BY MOUTH EVERY DAY   Misc Natural Products (OSTEO BI-FLEX ADV JOINT SHIELD PO) Take 1 tablet by mouth daily.   potassium chloride (KLOR-CON) 10 MEQ tablet TAKE 1 TABLET (10 MEQ TOTAL) BY MOUTH 3 (THREE) TIMES DAILY.   No facility-administered encounter medications on file as of 03/24/2022.    Allergies (verified)  Patient has no known allergies.   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   INFECTION, 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   LIVER FUNCTION TESTS, ABNORMAL 07/28/2008   Morbid obesity ([place]) 12/08/2006   Nuclear sclerotic cataract of both eyes 03/26/2020   OBESITY 09/24/2009   OBSTRUCTIVE SLEEP APNEA 12/08/2006   OSTEOARTHRITIS 12/08/2006   RUQ PAIN 06/16/2008   SHOULDER PAIN, RIGHT 02/07/2008   Sleep apnea    Swelling of limb 07/28/2008   THYROID FUNCTION TEST, ABNORMAL 12/08/2006   Past Surgical History:  Procedure Laterality Date   ABDOMINAL HYSTERECTOMY     CHOLECYSTECTOMY  06/27/2008   COLONOSCOPY N/A 12/13/2012   Procedure: COLONOSCOPY;  Surgeon: Juanita Craver, MD;  Location: [facility];  Service: Endoscopy;  Laterality: N/A;   COLONOSCOPY Left 02/17/2017   Procedure: COLONOSCOPY;  Surgeon: Carol Ada, MD;  Location: [facility];  Service: Endoscopy;  Laterality: Left;   common duct stone     ERCP    EYE SURGERY     LAPAROSCOPIC APPENDECTOMY N/A 12/30/2017   Procedure: APPENDECTOMY LAPAROSCOPIC;  Surgeon: Stark Klein, MD;  Location: [facility];  Service: General;  Laterality: N/A;   Family History  Problem Relation Age of Onset   Other Mother        blood clots   Cervical cancer Mother    Cancer Mother    Heart disease Brother    Diabetes Brother    Heart disease Sister    Diabetes Sister    Heart disease Brother    Diabetes Brother    Heart disease Brother    Diabetes Brother    Stroke Sister    Diabetes Sister    Throat cancer Father    Cancer Father    Diabetes Other        all siblings, 16 brothers, 89 sisters   Social History   Socioeconomic History   Marital status: Divorced    Spouse name: Not on file   Number of children: Not on file   Years of education: Not on file   Highest education level: Master's degree (e.g., MA, MS, MEng, MEd, MSW, MBA)  Occupational History   Occupation: retired    Fish farm manager: RETIRED  Tobacco Use    Smoking status: Never   Smokeless tobacco: Never  Vaping Use   Vaping Use: Never used  Substance and Sexual Activity   Alcohol use: No    Alcohol/week: 0.0 standard drinks of alcohol   Drug use: No   Sexual activity: Not on file  Other Topics Concern   Not on file  Social History Narrative   Master level education in math   Pt is currently retired   Pt is divorced with children   Recently had to move had a break in and thus away from her pool exercise   Social Determinants of Health   Financial Resource Strain: [place]  (03/24/2022)   Overall Financial Resource Strain (CARDIA)    Difficulty  of Paying Living Expenses: Not hard at all  Food Insecurity: No Food Insecurity (03/24/2022)   Hunger Vital Sign    Worried About Running Out of Food in the Last Year: Never true    Ran Out of Food in the Last Year: Never true  Transportation Needs: No Transportation Needs (03/24/2022)   PRAPARE - Hydrologist (Medical): No    Lack of Transportation (Non-Medical): No  Physical Activity: Insufficiently Active (03/24/2022)   Exercise Vital Sign    Days of Exercise per Week: 3 days    Minutes of Exercise per Session: 40 min  Stress: No Stress Concern Present (03/24/2022)   [place]    Feeling of Stress : Not at all  Social Connections: Moderately Integrated (03/24/2022)   Social Connection and Isolation Panel [NHANES]    Frequency of Communication with Friends and Family: More than three times a week    Frequency of Social Gatherings with Friends and Family: More than three times a week    Attends Religious Services: More than 4 times per year    Active Member of [employer] or Organizations: Yes    Attends Music therapist: More than 4 times per year    Marital Status: Divorced    Tobacco Counseling Counseling given: Not Answered   Clinical Intake:  Pre-visit preparation completed:  No  Pain : No/denies painNutrition Risk Assessment:  Has the patient had any N/V/D within the last 2 months?  No  Does the patient have any non-healing wounds?  No  Has the patient had any unintentional weight loss or weight gain?  No   Diabetes:  Is the patient diabetic?  Yes  If diabetic, was a CBG obtained today?  No  Did the patient bring in their glucometer from home?  No  How often do you monitor your CBG's? 4 X Weekly.   Financial Strains and Diabetes Management:  Are you having any financial strains with the device, your supplies or your medication? No .  Does the patient want to be seen by Chronic Care Management for management of their diabetes?  No  Would the patient like to be referred to a Nutritionist or for Diabetic Management?  No   Diabetic Exams:  Diabetic Eye Exam: Completed No. Overdue for diabetic eye exam. Pt has been advised about the importance in completing this exam. A referral has been placed today. Message sent to referral coordinator for scheduling purposes. Advised pt to expect a call from office referred to regarding appt.  Diabetic Foot Exam: Completed No. Pt has been advised about the importance in completing this exam. Pt is scheduled for diabetic foot exam on Followed by PCP.       BMI - recorded: 47.43 Nutritional Status: BMI > 30  Obese Nutritional Risks: None Diabetes: Yes CBG done?: No Did pt. bring in CBG monitor from home?: No  How often do you need to have someone help you when you read instructions, pamphlets, or other written materials from your doctor or pharmacy?: 1 - Never  Diabetic?  Yes  Interpreter Needed?: No  Information entered by :: Rolene Arbour LPN   Activities of Daily Living    03/24/2022   11:41 AM  In your present state of health, do you have any difficulty performing the following activities:  Hearing? 0  Vision? 0  Difficulty concentrating or making decisions? 0  Walking or climbing stairs? 0  Dressing  or bathing? 0  Doing errands, shopping? 0  Preparing Food and eating ? N  Using the Toilet? N  In the past six months, have you accidently leaked urine? N  Do you have problems with loss of bowel control? N  Managing your Medications? N  Managing your Finances? N  Housekeeping or managing your Housekeeping? N    Patient Care Team: Panosh, Standley Brooking, MD as PCP - General Fay Records, MD as PCP - Cardiology (Cardiology) Alphonsa Overall, MD (General Surgery) Carol Ada, MD (Gastroenterology) Gean Birchwood, DPM (Inactive) (Podiatry) Adrian Prows, MD as Consulting Physician (Cardiology) Juanita Craver, MD as Consulting Physician (Gastroenterology) Zadie Rhine Clent Demark, MD as Consulting Physician (Ophthalmology)  Indicate any recent Medical Services you may have received from other than [facility] providers in the past year (date may be approximate).     Assessment:   This is a routine wellness examination for Rogina.  Hearing/Vision screen Hearing Screening - Comments:: Denies hearing difficulties   Vision Screening - Comments:: - up to date with routine eye exams with  Dr Zadie Rhine  Dietary issues and exercise activities discussed: Exercise limited by: None identified   Goals Addressed               This Visit's Progress     Patient Stated (pt-stated)        Lose 10 lbs every 3 months this years.        Depression Screen    03/24/2022   11:40 AM 06/10/2021   11:30 AM 03/21/2021   11:16 AM 03/07/2020    2:03 PM 12/05/2019    9:48 AM 04/05/2018    8:53 AM 02/24/2018   11:57 AM  PHQ 2/9 Scores  PHQ - 2 Score 0 0 0 0 0 0 0  PHQ- 9 Score  1         Fall Risk    03/24/2022   11:42 AM 06/10/2021   11:30 AM 03/21/2021   11:20 AM 03/18/2021   10:18 PM 03/07/2020    2:15 PM  Fall Risk   Falls in the past year? 0 0 0 0 1  Number falls in past yr: 0 0 0  1  Injury with Fall? 0 0 0  1  Comment     bruises  Risk for fall due to : No Fall Risks No Fall Risks No Fall Risks  Impaired  vision;Impaired balance/gait;History of fall(s)  Follow up Falls prevention discussed Falls evaluation completed   Falls prevention discussed    FALL RISK PREVENTION PERTAINING TO THE HOME:  Any stairs in or around the home? No  If so, are there any without handrails? No  Home free of loose throw rugs in walkways, pet beds, electrical cords, etc? Yes  Adequate lighting in your home to reduce risk of falls? Yes   ASSISTIVE DEVICES UTILIZED TO PREVENT FALLS:  Life alert? Yes Use of a cane, walker or w/c? Yes Grab bars in the bathroom? No  Shower chair or bench in shower? Yes  Elevated toilet seat or a handicapped toilet? Yes    TIMED UP AND GO:  Was the test performed? No . Audio visit   Cognitive Function:        03/24/2022   11:42 AM 03/21/2021   11:21 AM 03/07/2020    2:20 PM  6CIT Screen  What Year? 0 points 0 points 0 points  What month? 0 points 0 points 0 points  What time? 0 points 0 points  Count back from 20 0 points 0 points 0 points  Months in reverse 0 points 0 points 0 points  Repeat phrase 2 points 0 points 0 points  Total Score 2 points 0 points     Immunizations Immunization History  Administered Date(s) Administered   PFIZER(Purple Top)SARS-COV-2 Vaccination 10/31/2020, 11/15/2020   Pneumococcal Conjugate-13 11/23/2015   Pneumococcal Polysaccharide-23 05/24/2012   Td 01/28/2003   Tdap 12/05/2019    TDAP status: Up to date  Flu Vaccine status: Up to date  Pneumococcal vaccine status: Up to date  Covid-19 vaccine status: Completed vaccines  Qualifies for Shingles Vaccine? Yes   Zostavax completed No   Shingrix Completed?: No.    Education has been provided regarding the importance of this vaccine. Patient has been advised to call insurance company to determine out of pocket expense if they have not yet received this vaccine. Advised may also receive vaccine at local pharmacy or Health Dept. Verbalized acceptance and understanding.  Screening  Tests Health Maintenance  Topic Date Due   DEXA SCAN  Never done   Zoster Vaccines- Shingrix (1 of 2) 06/22/2022 (Originally 04/13/1964)   HEMOGLOBIN A1C  04/15/2022   Diabetic kidney evaluation - eGFR measurement  06/11/2022   Diabetic kidney evaluation - Urine ACR  06/11/2022   OPHTHALMOLOGY EXAM  09/24/2022   FOOT EXAM  12/27/2022   Medicare Annual Wellness (AWV)  03/25/2023   DTaP/Tdap/Td (3 - Td or Tdap) 12/04/2029   Pneumonia Vaccine 80+ Years old  Completed   Hepatitis C Screening  Completed   HPV VACCINES  Aged Out   INFLUENZA VACCINE  Discontinued   COLONOSCOPY (Pts 45-36yr Insurance coverage will need to be confirmed)  Discontinued   COVID-19 Vaccine  Discontinued    Health Maintenance  Health Maintenance Due  Topic Date Due   DEXA SCAN  Never done    Colorectal cancer screening: No longer required.   Mammogram status: No longer required due to Age.  Bone Density status: Ordered 03/24/22. Pt provided with contact info and advised to call to schedule appt.  Lung Cancer Screening: (Low Dose CT Chest recommended if Age 79-80years, 30 pack-year currently smoking OR have quit w/in 15years.) does not qualify.     Additional Screening:  Hepatitis C Screening: does qualify; Completed 02/26/10  Vision Screening: Recommended annual ophthalmology exams for early detection of glaucoma and other disorders of the eye. Is the patient up to date with their annual eye exam?  Yes  Who is the provider or what is the name of the office in which the patient attends annual eye exams? Dr RZadie RhineIf pt is not established with a provider, would they like to be referred to a provider to establish care? No .   Dental Screening: Recommended annual dental exams for proper oral hygiene  Community Resource Referral / Chronic Care Management:  CRR required this visit?  No   CCM required this visit?  No      Plan:     I have personally reviewed and noted the following in the  patient's chart:   Medical and social history Use of alcohol, tobacco or illicit drugs  Current medications and supplements including opioid prescriptions. Patient is not currently taking opioid prescriptions. Functional ability and status Nutritional status Physical activity Advanced directives List of other physicians Hospitalizations, surgeries, and ER visits in previous 12 months Vitals Screenings to include cognitive, depression, and falls Referrals and appointments  In addition, I have reviewed and discussed with patient  certain preventive protocols, quality metrics, and best practice recommendations. A written personalized care plan for preventive services as well as general preventive health recommendations were provided to patient.     Criselda Peaches, LPN   [id number]   Nurse Notes: None

## 2022-03-24 NOTE — Patient Instructions (Addendum)
Rebecca Rich , Thank you for taking time to come for your Medicare Wellness Visit. I appreciate your ongoing commitment to your health goals. Please review the following plan we discussed and let me know if I can assist you in the future.   These are the goals we discussed:  Goals       Patient Stated (pt-stated)      Lose 10 lbs every 3 months this years.         This is a list of the screening recommended for you and due dates:  Health Maintenance  Topic Date Due   DEXA scan (bone density measurement)  Never done   Zoster (Shingles) Vaccine (1 of 2) 06/22/2022*   Hemoglobin A1C  04/15/2022   Yearly kidney function blood test for diabetes  06/11/2022   Yearly kidney health urinalysis for diabetes  06/11/2022   Eye exam for diabetics  09/24/2022   Complete foot exam   12/27/2022   Medicare Annual Wellness Visit  03/25/2023   DTaP/Tdap/Td vaccine (3 - Td or Tdap) 12/04/2029   Pneumonia Vaccine  Completed   Hepatitis C Screening: USPSTF Recommendation to screen - Ages 78-79 yo.  Completed   HPV Vaccine  Aged Out   Flu Shot  Discontinued   Colon Cancer Screening  Discontinued   COVID-19 Vaccine  Discontinued  *Topic was postponed. The date shown is not the original due date.    Advanced directives: Please bring a copy of your health care power of attorney and living will to the office to be added to your chart at your convenience.   Conditions/risks identified: None  Next appointment: Follow up in one year for your annual wellness visit     Preventive Care 65 Years and Older, Female Preventive care refers to lifestyle choices and visits with your health care provider that can promote health and wellness. What does preventive care include? A yearly physical exam. This is also called an annual well check. Dental exams once or twice a year. Routine eye exams. Ask your health care provider how often you should have your eyes checked. Personal lifestyle choices,  including: Daily care of your teeth and gums. Regular physical activity. Eating a healthy diet. Avoiding tobacco and drug use. Limiting alcohol use. Practicing safe sex. Taking low-dose aspirin every day. Taking vitamin and mineral supplements as recommended by your health care provider. What happens during an annual well check? The services and screenings done by your health care provider during your annual well check will depend on your age, overall health, lifestyle risk factors, and family history of disease. Counseling  Your health care provider may ask you questions about your: Alcohol use. Tobacco use. Drug use. Emotional well-being. Home and relationship well-being. Sexual activity. Eating habits. History of falls. Memory and ability to understand (cognition). Work and work Statistician. Reproductive health. Screening  You may have the following tests or measurements: Height, weight, and BMI. Blood pressure. Lipid and cholesterol levels. These may be checked every 5 years, or more frequently if you are over 41 years old. Skin check. Lung cancer screening. You may have this screening every year starting at age 41 if you have a 30-pack-year history of smoking and currently smoke or have quit within the past 15 years. Fecal occult blood test (FOBT) of the stool. You may have this test every year starting at age 46. Flexible sigmoidoscopy or colonoscopy. You may have a sigmoidoscopy every 5 years or a colonoscopy every 10 years starting at  age 52. Hepatitis C blood test. Hepatitis B blood test. Sexually transmitted disease (STD) testing. Diabetes screening. This is done by checking your blood sugar (glucose) after you have not eaten for a while (fasting). You may have this done every 1-3 years. Bone density scan. This is done to screen for osteoporosis. You may have this done starting at age 26. Mammogram. This may be done every 1-2 years. Talk to your health care provider  about how often you should have regular mammograms. Talk with your health care provider about your test results, treatment options, and if necessary, the need for more tests. Vaccines  Your health care provider may recommend certain vaccines, such as: Influenza vaccine. This is recommended every year. Tetanus, diphtheria, and acellular pertussis (Tdap, Td) vaccine. You may need a Td booster every 10 years. Zoster vaccine. You may need this after age 54. Pneumococcal 13-valent conjugate (PCV13) vaccine. One dose is recommended after age 56. Pneumococcal polysaccharide (PPSV23) vaccine. One dose is recommended after age 86. Talk to your health care provider about which screenings and vaccines you need and how often you need them. This information is not intended to replace advice given to you by your health care provider. Make sure you discuss any questions you have with your health care provider. Document Released: 02/09/2015 Document Revised: 10/03/2015 Document Reviewed: 11/14/2014 Elsevier Interactive Patient Education  2017 [place] Prevention in the Home Falls can cause injuries. They can happen to people of all ages. There are many things you can do to make your home safe and to help prevent falls. What can I do on the outside of my home? Regularly fix the edges of walkways and driveways and fix any cracks. Remove anything that might make you trip as you walk through a door, such as a raised step or threshold. Trim any bushes or trees on the path to your home. Use bright outdoor lighting. Clear any walking paths of anything that might make someone trip, such as rocks or tools. Regularly check to see if handrails are loose or broken. Make sure that both sides of any steps have handrails. Any raised decks and porches should have guardrails on the edges. Have any leaves, snow, or ice cleared regularly. Use sand or salt on walking paths during winter. Clean up any spills in  your garage right away. This includes oil or grease spills. What can I do in the bathroom? Use night lights. Install grab bars by the toilet and in the tub and shower. Do not use towel bars as grab bars. Use non-skid mats or decals in the tub or shower. If you need to sit down in the shower, use a plastic, non-slip stool. Keep the floor dry. Clean up any water that spills on the floor as soon as it happens. Remove soap buildup in the tub or shower regularly. Attach bath mats securely with double-sided non-slip rug tape. Do not have throw rugs and other things on the floor that can make you trip. What can I do in the bedroom? Use night lights. Make sure that you have a light by your bed that is easy to reach. Do not use any sheets or blankets that are too big for your bed. They should not hang down onto the floor. Have a firm chair that has side arms. You can use this for support while you get dressed. Do not have throw rugs and other things on the floor that can make you trip. What can I  do in the kitchen? Clean up any spills right away. Avoid walking on wet floors. Keep items that you use a lot in easy-to-reach places. If you need to reach something above you, use a strong step stool that has a grab bar. Keep electrical cords out of the way. Do not use floor polish or wax that makes floors slippery. If you must use wax, use non-skid floor wax. Do not have throw rugs and other things on the floor that can make you trip. What can I do with my stairs? Do not leave any items on the stairs. Make sure that there are handrails on both sides of the stairs and use them. Fix handrails that are broken or loose. Make sure that handrails are as long as the stairways. Check any carpeting to make sure that it is firmly attached to the stairs. Fix any carpet that is loose or worn. Avoid having throw rugs at the top or bottom of the stairs. If you do have throw rugs, attach them to the floor with carpet  tape. Make sure that you have a light switch at the top of the stairs and the bottom of the stairs. If you do not have them, ask someone to add them for you. What else can I do to help prevent falls? Wear shoes that: Do not have high heels. Have rubber bottoms. Are comfortable and fit you well. Are closed at the toe. Do not wear sandals. If you use a stepladder: Make sure that it is fully opened. Do not climb a closed stepladder. Make sure that both sides of the stepladder are locked into place. Ask someone to hold it for you, if possible. Clearly mark and make sure that you can see: Any grab bars or handrails. First and last steps. Where the edge of each step is. Use tools that help you move around (mobility aids) if they are needed. These include: Canes. Walkers. Scooters. Crutches. Turn on the lights when you go into a dark area. Replace any light bulbs as soon as they burn out. Set up your furniture so you have a clear path. Avoid moving your furniture around. If any of your floors are uneven, fix them. If there are any pets around you, be aware of where they are. Review your medicines with your doctor. Some medicines can make you feel dizzy. This can increase your chance of falling. Ask your doctor what other things that you can do to help prevent falls. This information is not intended to replace advice given to you by your health care provider. Make sure you discuss any questions you have with your health care provider. Document Released: 11/09/2008 Document Revised: 06/21/2015 Document Reviewed: 02/17/2014 Elsevier Interactive Patient Education  2017 [employer].

## 2022-03-31 NOTE — Progress Notes (Unsigned)
No chief complaint on file.   HPI: Rebecca Rich 77 y.o. come in for Chronic disease management  ROS: See pertinent positives and negatives per HPI.  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   INFECTION, 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   LIVER FUNCTION TESTS, ABNORMAL 07/28/2008   Morbid obesity ([place]) 12/08/2006   Nuclear sclerotic cataract of both eyes 03/26/2020   OBESITY 09/24/2009   OBSTRUCTIVE SLEEP APNEA 12/08/2006   OSTEOARTHRITIS 12/08/2006   RUQ PAIN 06/16/2008   SHOULDER PAIN, RIGHT 02/07/2008   Sleep apnea    Swelling of limb 07/28/2008   THYROID FUNCTION TEST, ABNORMAL 12/08/2006    Family History  Problem Relation Age of Onset   Other Mother        blood clots   Cervical cancer Mother    Cancer Mother    Heart disease Brother    Diabetes Brother    Heart disease Sister    Diabetes Sister    Heart disease Brother    Diabetes Brother    Heart disease Brother    Diabetes Brother    Stroke Sister    Diabetes Sister    Throat cancer Father    Cancer Father    Diabetes Other        all siblings, 27 brothers, 5 sisters    Social History   Socioeconomic History   Marital status: Divorced    Spouse name: Not on file   Number of children: Not on file   Years of education: Not on file   Highest education level: Master's degree (e.g., MA, MS, MEng, MEd, MSW, MBA)  Occupational History   Occupation: retired    Fish farm manager:  RETIRED  Tobacco Use   Smoking status: Never   Smokeless tobacco: Never  Vaping Use   Vaping Use: Never used  Substance and Sexual Activity   Alcohol use: No    Alcohol/week: 0.0 standard drinks of alcohol   Drug use: No   Sexual activity: Not on file  Other Topics Concern   Not on file  Social History Narrative   Master level education in math   Pt is currently retired   Pt is divorced with children   Recently had to move had a break in and thus away from her pool exercise   Social Determinants of Health   Financial Resource Strain: Low Risk  (03/24/2022)   Overall Financial Resource Strain (CARDIA)    Difficulty of Paying Living Expenses: Not hard at all  Food Insecurity: No Food Insecurity (03/24/2022)   Hunger Vital Sign    Worried About Running Out of Food in the Last Year: Never true    Ran Out of Food in the Last Year: Never true  Transportation Needs: No Transportation Needs (03/24/2022)   PRAPARE - Hydrologist (Medical): No    Lack of Transportation (Non-Medical): No  Physical Activity: Insufficiently Active (03/24/2022)   Exercise Vital Sign  Days of Exercise per Week: 3 days    Minutes of Exercise per Session: 40 min  Stress: No Stress Concern Present (03/24/2022)   [place]    Feeling of Stress : Not at all  Social Connections: Moderately Integrated (03/24/2022)   Social Connection and Isolation Panel [NHANES]    Frequency of Communication with Friends and Family: More than three times a week    Frequency of Social Gatherings with Friends and Family: More than three times a week    Attends Religious Services: More than 4 times per year    Active Member of [employer] or Organizations: Yes    Attends Music therapist: More than 4 times per year    Marital Status: Divorced    Outpatient Medications Prior to Visit  Medication Sig Dispense Refill   amLODipine  (NORVASC) 10 MG tablet TAKE 1 TABLET BY MOUTH EVERY DAY 90 tablet 3   chlorthalidone (HYGROTON) 25 MG tablet TAKE 1 TABLET BY MOUTH EVERY DAY 90 tablet 0   cholecalciferol (VITAMIN D) 1000 UNITS tablet Take 1,000 Units by mouth daily.     colchicine 0.6 MG tablet Take initial dose of 2 tablets tonight. Days 2-13, take one tablet per day. 15 tablet 0   glucose blood (ACCU-CHEK AVIVA PLUS) test strip Use to check blood sugar daily E11.9 100 each 12   Lancets (ACCU-CHEK SOFT TOUCH) lancets Use to test blood glucose twice daily 50 each 12   levothyroxine (SYNTHROID) 75 MCG tablet TAKE 1 TABLET BY MOUTH EVERY DAY BEFORE BREAKFAST 90 tablet 1   losartan (COZAAR) 100 MG tablet TAKE 1 TABLET (100 MG TOTAL) BY MOUTH DAILY. MAY BEGIN AT 1/2 TABLET DAILY AND INCREASE TO 1 TABLET 90 tablet 1   metoprolol succinate (TOPROL-XL) 25 MG 24 hr tablet TAKE 1 TABLET BY MOUTH EVERY DAY 30 tablet 10   Misc Natural Products (OSTEO BI-FLEX ADV JOINT SHIELD PO) Take 1 tablet by mouth daily.     potassium chloride (KLOR-CON) 10 MEQ tablet TAKE 1 TABLET (10 MEQ TOTAL) BY MOUTH 3 (THREE) TIMES DAILY. 90 tablet 5   No facility-administered medications prior to visit.     EXAM:  There were no vitals taken for this visit.  There is no height or weight on file to calculate BMI. Wt Readings from Last 3 Encounters:  03/24/22 251 lb (113.9 kg)  10/15/21 251 lb 12.8 oz (114.2 kg)  09/10/21 256 lb 3.2 oz (116.2 kg)    GENERAL: vitals reviewed and listed above, alert, oriented, appears well hydrated and in no acute distress HEENT: atraumatic, conjunctiva  clear, no obvious abnormalities on inspection of external nose and ears OP : no lesion edema or exudate  NECK: no obvious masses on inspection palpation  LUNGS: clear to auscultation bilaterally, no wheezes, rales or rhonchi, good air movement CV: HRRR, no clubbing cyanosis or  peripheral edema nl cap refill  MS: moves all extremities without noticeable focal   abnormality PSYCH: pleasant and cooperative, no obvious depression or anxiety Lab Results  Component Value Date   WBC 4.6 06/10/2021   HGB 11.8 (L) 06/10/2021   HCT 35.5 (L) 06/10/2021   PLT 153.0 06/10/2021   GLUCOSE 107 (H) 06/10/2021   CHOL 134 06/10/2021   TRIG 105.0 06/10/2021   HDL 50.90 06/10/2021   LDLCALC 62 06/10/2021   ALT 12 06/10/2021   AST 12 06/10/2021   NA 139 06/10/2021   K 3.8 06/10/2021  CL 107 06/10/2021   CREATININE 1.22 (H) 06/10/2021   BUN 26 (H) 06/10/2021   CO2 24 06/10/2021   TSH 3.62 06/10/2021   HGBA1C 6.3 (A) 10/15/2021   MICROALBUR 1.2 06/10/2021   BP Readings from Last 3 Encounters:  02/07/22 (!) 145/49  10/15/21 (!) 120/50  09/10/21 120/60    ASSESSMENT AND PLAN:  Discussed the following assessment and plan:  No diagnosis found. Due for full labs  in May   -Patient advised to return or notify health care team  if  new concerns arise.  There are no Patient Instructions on file for this visit.   Standley Brooking. Alizon Schmeling M.D.

## 2022-04-01 ENCOUNTER — Ambulatory Visit (INDEPENDENT_AMBULATORY_CARE_PROVIDER_SITE_OTHER): Payer: Medicare HMO | Admitting: Internal Medicine

## 2022-04-01 ENCOUNTER — Encounter: Payer: Self-pay | Admitting: Internal Medicine

## 2022-04-01 VITALS — BP 126/40 | HR 66 | Temp 98.3°F | Ht 61.0 in | Wt 264.8 lb

## 2022-04-01 DIAGNOSIS — I10 Essential (primary) hypertension: Secondary | ICD-10-CM | POA: Diagnosis not present

## 2022-04-01 DIAGNOSIS — R600 Localized edema: Secondary | ICD-10-CM

## 2022-04-01 DIAGNOSIS — E669 Obesity, unspecified: Secondary | ICD-10-CM

## 2022-04-01 DIAGNOSIS — E039 Hypothyroidism, unspecified: Secondary | ICD-10-CM

## 2022-04-01 DIAGNOSIS — Z79899 Other long term (current) drug therapy: Secondary | ICD-10-CM

## 2022-04-01 DIAGNOSIS — E1169 Type 2 diabetes mellitus with other specified complication: Secondary | ICD-10-CM

## 2022-04-01 DIAGNOSIS — I351 Nonrheumatic aortic (valve) insufficiency: Secondary | ICD-10-CM

## 2022-04-01 DIAGNOSIS — Z8739 Personal history of other diseases of the musculoskeletal system and connective tissue: Secondary | ICD-10-CM

## 2022-04-01 DIAGNOSIS — E785 Hyperlipidemia, unspecified: Secondary | ICD-10-CM

## 2022-04-01 LAB — CBC WITH DIFFERENTIAL/PLATELET
Basophils Absolute: 0 10*3/uL (ref 0.0–0.1)
Basophils Relative: 0.5 % (ref 0.0–3.0)
Eosinophils Absolute: 0.2 10*3/uL (ref 0.0–0.7)
Eosinophils Relative: 4 % (ref 0.0–5.0)
HCT: 35.9 % — ABNORMAL LOW (ref 36.0–46.0)
Hemoglobin: 12.1 g/dL (ref 12.0–15.0)
Lymphocytes Relative: 29.5 % (ref 12.0–46.0)
Lymphs Abs: 1.6 10*3/uL (ref 0.7–4.0)
MCHC: 33.8 g/dL (ref 30.0–36.0)
MCV: 89 fl (ref 78.0–100.0)
Monocytes Absolute: 0.5 10*3/uL (ref 0.1–1.0)
Monocytes Relative: 8.5 % (ref 3.0–12.0)
Neutro Abs: 3.1 10*3/uL (ref 1.4–7.7)
Neutrophils Relative %: 57.5 % (ref 43.0–77.0)
Platelets: 169 10*3/uL (ref 150.0–400.0)
RBC: 4.03 Mil/uL (ref 3.87–5.11)
RDW: 14.3 % (ref 11.5–15.5)
WBC: 5.4 10*3/uL (ref 4.0–10.5)

## 2022-04-01 LAB — MICROALBUMIN / CREATININE URINE RATIO
Creatinine,U: 81.9 mg/dL
Microalb Creat Ratio: 1.6 mg/g (ref 0.0–30.0)
Microalb, Ur: 1.3 mg/dL (ref 0.0–1.9)

## 2022-04-01 LAB — BASIC METABOLIC PANEL
BUN: 38 mg/dL — ABNORMAL HIGH (ref 6–23)
CO2: 27 mEq/L (ref 19–32)
Calcium: 9.8 mg/dL (ref 8.4–10.5)
Chloride: 103 mEq/L (ref 96–112)
Creatinine, Ser: 1.27 mg/dL — ABNORMAL HIGH (ref 0.40–1.20)
GFR: 40.38 mL/min — ABNORMAL LOW (ref >60.00)
Glucose, Bld: 129 mg/dL — ABNORMAL HIGH (ref 70–99)
Potassium: 4 mEq/L (ref 3.5–5.1)
Sodium: 139 mEq/L (ref 135–145)

## 2022-04-01 LAB — LIPID PANEL
Cholesterol: 146 mg/dL (ref 0–200)
HDL: 55.7 mg/dL (ref >39.00)
LDL Cholesterol: 69 mg/dL (ref 0–99)
NonHDL: 90.47
Total CHOL/HDL Ratio: 3
Triglycerides: 109 mg/dL (ref 0.0–149.0)
VLDL: 21.8 mg/dL (ref 0.0–40.0)

## 2022-04-01 LAB — T4, FREE: Free T4: 0.86 ng/dL (ref 0.60–1.60)

## 2022-04-01 LAB — POCT GLYCOSYLATED HEMOGLOBIN (HGB A1C): Hemoglobin A1C: 7 % — AB (ref 4.0–5.6)

## 2022-04-01 LAB — TSH: TSH: 4.1 u[IU]/mL (ref 0.35–5.50)

## 2022-04-01 LAB — HEPATIC FUNCTION PANEL
ALT: 18 U/L (ref 0–35)
AST: 14 U/L (ref 0–37)
Albumin: 3.8 g/dL (ref 3.5–5.2)
Alkaline Phosphatase: 72 U/L (ref 39–117)
Bilirubin, Direct: 0.1 mg/dL (ref 0.0–0.3)
Total Bilirubin: 0.5 mg/dL (ref 0.2–1.2)
Total Protein: 7.1 g/dL (ref 6.0–8.3)

## 2022-04-01 LAB — URIC ACID: Uric Acid, Serum: 9.8 mg/dL — ABNORMAL HIGH (ref 2.4–7.0)

## 2022-04-01 NOTE — Patient Instructions (Signed)
Good to see you today . Glad  you are doing well .  Continue attention to life style Swelling could be from a number of  causes .  We can consider decreasing amlodipine to 5 mg if needed.  But for now have you decrease sodium in  diet  Limit rice  change to whole grain food.   Lab today . Make appt for  CPX in 3 months  or as needed.   Will look at record about when due for cards FU because of the  aortic valve  leakiness .

## 2022-04-08 ENCOUNTER — Ambulatory Visit: Payer: Medicare HMO | Admitting: Podiatry

## 2022-04-08 DIAGNOSIS — B351 Tinea unguium: Secondary | ICD-10-CM | POA: Diagnosis not present

## 2022-04-08 DIAGNOSIS — E119 Type 2 diabetes mellitus without complications: Secondary | ICD-10-CM | POA: Diagnosis not present

## 2022-04-08 DIAGNOSIS — M79609 Pain in unspecified limb: Secondary | ICD-10-CM

## 2022-04-08 NOTE — Progress Notes (Signed)
  Subjective:  Patient ID: Rebecca Rich, female    DOB: 1945/11/13,  MRN: [id number]  Rebecca Rich presents to clinic today for preventative diabetic foot care and painful elongated mycotic toenails 1-5 bilaterally which are tender when wearing enclosed shoe gear. Pain is relieved with periodic professional debridement.  Chief Complaint  Patient presents with   Nail Problem    DFC BS-[id number] A1C-7.0 PCP-Panosh PCP VST- 04/01/2022   New problem(s): None.   PCP is Panosh, Standley Brooking, MD.  No Known Allergies  Review of Systems: Negative except as noted in the HPI.  Objective: No changes noted in today's physical examination. There were no vitals filed for this visit. Rebecca Rich is a pleasant 77 y.o. female morbidly obese in NAD. AAO x 3.  ascular Examination: Capillary refill time immediate b/l.Vascular status intact b/l with palpable pedal pulses. Pedal hair present b/l. No pain with calf compression b/l. Skin temperature gradient WNL LLE. Trace edema noted left lower extremity. +1 pitting edema right lower extremity.  Neurological Examination: Sensation grossly intact b/l with 10 gram monofilament. Vibratory sensation intact b/l. Protective sensation intact 5/5 intact bilaterally with 10g monofilament b/l. Vibratory sensation intact b/l.  Dermatological Examination: Pedal skin with normal turgor, texture and tone b/l. Toenails 1-5 b/l thick, discolored, elongated with subungual debris and pain on dorsal palpation. No open wounds b/l LE. No interdigital macerations noted b/l LE.  Musculoskeletal Examination: Muscle strength 5/5 to all lower extremity muscle groups bilaterally. Pes planus deformity noted bilateral LE. Antalgic gait noted. Utilizes walker for ambulation assistance.  Radiographs: None  Assessment/Plan: 1. Pain due to onychomycosis of nail   2. Diabetes mellitus without complication ([place])     -Consent given for treatment as described below: -Examined  patient. -Patient to continue soft, supportive shoe gear daily. -Mycotic toenails 1-5 bilaterally were debrided in length and girth with sterile nail nippers and dremel without incident. -Patient/POA to call should there be question/concern in the interim.   Return in about 3 months (around 07/09/2022).  Marzetta Board, DPM

## 2022-04-09 NOTE — Progress Notes (Signed)
Results are astable or normal  but uric acid is elevated .  We could consider adding medication to lower uric acid to decrease risk of gout   .such as allopurinol  medication    but could temporarily aggravate gout until all stable . Let me know and we could add this medicine and colchicine to keep the gout at [place]  in the interim  Otherwise we can discuss at your next visit

## 2022-04-11 ENCOUNTER — Encounter: Payer: Self-pay | Admitting: Podiatry

## 2022-04-25 ENCOUNTER — Other Ambulatory Visit: Payer: Self-pay | Admitting: Internal Medicine

## 2022-06-05 ENCOUNTER — Other Ambulatory Visit: Payer: Self-pay | Admitting: Family

## 2022-06-26 LAB — HM MAMMOGRAPHY

## 2022-06-27 ENCOUNTER — Encounter: Payer: Self-pay | Admitting: Internal Medicine

## 2022-07-01 ENCOUNTER — Encounter: Payer: Medicare HMO | Admitting: Internal Medicine

## 2022-07-17 ENCOUNTER — Other Ambulatory Visit: Payer: Self-pay | Admitting: Internal Medicine

## 2022-08-06 ENCOUNTER — Ambulatory Visit (INDEPENDENT_AMBULATORY_CARE_PROVIDER_SITE_OTHER): Payer: Medicare HMO | Admitting: Podiatry

## 2022-08-06 VITALS — BP 155/57

## 2022-08-06 DIAGNOSIS — B351 Tinea unguium: Secondary | ICD-10-CM | POA: Diagnosis not present

## 2022-08-06 DIAGNOSIS — M79609 Pain in unspecified limb: Secondary | ICD-10-CM | POA: Diagnosis not present

## 2022-08-06 DIAGNOSIS — E119 Type 2 diabetes mellitus without complications: Secondary | ICD-10-CM

## 2022-08-10 ENCOUNTER — Encounter: Payer: Self-pay | Admitting: Podiatry

## 2022-08-10 NOTE — Progress Notes (Signed)
  Subjective:  Patient ID: Rebecca Rich, female    DOB: 12/21/1945,  MRN: [id number]  KHISHA SNELLEN presents to clinic today for preventative diabetic foot care and painful thick toenails that are difficult to trim. Pain interferes with ambulation. Aggravating factors include wearing enclosed shoe gear. Pain is relieved with periodic professional debridement.  Chief Complaint  Patient presents with   Nail Problem     Routine foot care   New problem(s): None.   PCP is Panosh, Neta Mends, MD.  No Known Allergies  Review of Systems: Negative except as noted in the HPI.  Objective: No changes noted in today's physical examination. Vitals:   08/06/22 1355  BP: (!) 155/57   Rebecca Rich is a pleasant 77 y.o. female in NAD. AAO x 3.  Vascular Examination: Capillary refill time immediate b/l.Vascular status intact b/l with palpable pedal pulses. Pedal hair present b/l. No pain with calf compression b/l. Skin temperature gradient WNL LLE. Trace edema noted left lower extremity. +1 pitting edema right lower extremity.  Neurological Examination: Sensation grossly intact b/l with 10 gram monofilament. Vibratory sensation intact b/l. Protective sensation intact 5/5 intact bilaterally with 10g monofilament b/l. Vibratory sensation intact b/l.  Dermatological Examination: Pedal skin with normal turgor, texture and tone b/l. Toenails 1-5 b/l thick, discolored, elongated with subungual debris and pain on dorsal palpation. No open wounds b/l LE. No interdigital macerations noted b/l LE.  Musculoskeletal Examination: Muscle strength 5/5 to all lower extremity muscle groups bilaterally. Pes planus deformity noted bilateral LE. Utilizes walker for ambulation assistance.  Radiographs: None  Assessment/Plan: 1. Pain due to onychomycosis of nail   2. Diabetes mellitus without complication [facility])   Patient was evaluated and treated. All patient's and/or POA's questions/concerns addressed on today's  visit. Toenails 1-5 debrided in length and girth without incident. Continue soft, supportive shoe gear daily. Report any pedal injuries to medical professional. Call office if there are any questions/concerns. -Continue foot and shoe inspections daily. Monitor blood glucose per PCP/Endocrinologist's recommendations. -Patient/POA to call should there be question/concern in the interim.   Return in about 3 months (around 11/06/2022).  Rebecca Rich, DPM

## 2022-08-24 ENCOUNTER — Other Ambulatory Visit: Payer: Self-pay | Admitting: Family

## 2022-08-26 ENCOUNTER — Other Ambulatory Visit: Payer: Self-pay | Admitting: Internal Medicine

## 2022-09-01 NOTE — Progress Notes (Unsigned)
No chief complaint on file.   HPI: Patient  Rebecca Rich  77 y.o. comes in today for Preventive Health Care visit  And  Chronic disease management   HT CV DM Obesity   Health Maintenance  Topic Date Due   Zoster Vaccines- Shingrix (1 of 2) Never done   DEXA SCAN  Never done   OPHTHALMOLOGY EXAM  09/24/2022   HEMOGLOBIN A1C  10/02/2022   FOOT EXAM  12/27/2022   Medicare Annual Wellness (AWV)  03/25/2023   Diabetic kidney evaluation - eGFR measurement  04/01/2023   Diabetic kidney evaluation - Urine ACR  04/01/2023   DTaP/Tdap/Td (3 - Td or Tdap) 12/04/2029   Pneumonia Vaccine 20+ Years old  Completed   Hepatitis C Screening  Completed   HPV VACCINES  Aged Out   INFLUENZA VACCINE  Discontinued   Colonoscopy  Discontinued   COVID-19 Vaccine  Discontinued   Health Maintenance Review LIFESTYLE:  Exercise:   Tobacco/ETS: Alcohol:  Sugar beverages: Sleep: Drug use: no HH of  Work:    ROS:  GEN/ HEENT: No fever, significant weight changes sweats headaches vision problems hearing changes, CV/ PULM; No chest pain shortness of breath cough, syncope,edema  change in exercise tolerance. GI /GU: No adominal pain, vomiting, change in bowel habits. No blood in the stool. No significant GU symptoms. SKIN/HEME: ,no acute skin rashes suspicious lesions or bleeding. No lymphadenopathy, nodules, masses.  NEURO/ PSYCH:  No neurologic signs such as weakness numbness. No depression anxiety. IMM/ Allergy: No unusual infections.  Allergy .   REST of 12 system review negative except as per HPI   Past Medical History:  Diagnosis Date   Acute bronchospasm 08/24/2009   Acute lower GI bleeding 02/16/2017   Acute perforated appendicitis w abscess s/p lap appendectomy 12/30/2017 12/30/2017   Acute sphenoidal sinusitis 02/26/2010   Qualifier: Diagnosis of  By: Fabian Sharp MD, Neta Mends    CARDIAC MURMUR 12/08/2006   COLONIC POLYPS, HX OF 12/08/2006   DIABETES MELLITUS, TYPE II 12/08/2006    Gallbladder/common duct stone, without infection, with obstruction 03/01/2010   removed ercp   HYPERLIPIDEMIA 12/08/2006   HYPERTENSION 12/08/2006   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   INFECTION, 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   LIVER FUNCTION TESTS, ABNORMAL 07/28/2008   Morbid obesity (HCC) 12/08/2006   Nuclear sclerotic cataract of both eyes 03/26/2020   OBESITY 09/24/2009   OBSTRUCTIVE SLEEP APNEA 12/08/2006   OSTEOARTHRITIS 12/08/2006   RUQ PAIN 06/16/2008   SHOULDER PAIN, RIGHT 02/07/2008   Sleep apnea    Swelling of limb 07/28/2008   THYROID FUNCTION TEST, ABNORMAL 12/08/2006    Past Surgical History:  Procedure Laterality Date   ABDOMINAL HYSTERECTOMY     CHOLECYSTECTOMY  06/27/2008   COLONOSCOPY N/A 12/13/2012   Procedure: COLONOSCOPY;  Surgeon: Charna Elizabeth, MD;  Location: [facility];  Service: Endoscopy;  Laterality: N/A;   COLONOSCOPY Left 02/17/2017   Procedure: COLONOSCOPY;  Surgeon: Jeani Hawking, MD;  Location: [facility] ENDOSCOPY;  Service: Endoscopy;  Laterality: Left;   common duct stone     ERCP    EYE SURGERY     LAPAROSCOPIC APPENDECTOMY N/A 12/30/2017   Procedure: APPENDECTOMY LAPAROSCOPIC;  Surgeon: Almond Lint, MD;  Location: [facility];  Service: General;  Laterality: N/A;    Family History  Problem Relation Age of Onset   Other Mother        blood clots   Cervical cancer Mother    Cancer Mother  Heart disease Brother    Diabetes Brother    Heart disease Sister    Diabetes Sister    Heart disease Brother    Diabetes Brother    Heart disease Brother    Diabetes Brother    Stroke Sister    Diabetes Sister    Throat cancer Father    Cancer Father    Diabetes Other        all siblings, 4 brothers, 5 sisters    Social History   Socioeconomic History   Marital status: Divorced    Spouse name: Not on file   Number of children: Not on file   Years of education: Not on file   Highest education level: Master's degree (e.g., MA, MS, MEng,  MEd, MSW, MBA)  Occupational History   Occupation: retired    Associate Professor: RETIRED  Tobacco Use   Smoking status: Never   Smokeless tobacco: Never  Vaping Use   Vaping status: Never Used  Substance and Sexual Activity   Alcohol use: No    Alcohol/week: 0.0 standard drinks of alcohol   Drug use: No   Sexual activity: Not on file  Other Topics Concern   Not on file  Social History Narrative   Master level education in math   Pt is currently retired   Pt is divorced with children   Recently had to move had a break in and thus away from her pool exercise   Social Determinants of Health   Financial Resource Strain: Low Risk  (03/24/2022)   Overall Financial Resource Strain (CARDIA)    Difficulty of Paying Living Expenses: Not hard at all  Food Insecurity: No Food Insecurity (03/24/2022)   Hunger Vital Sign    Worried About Running Out of Food in the Last Year: Never true    Ran Out of Food in the Last Year: Never true  Transportation Needs: No Transportation Needs (03/24/2022)   PRAPARE - Administrator, Civil Service (Medical): No    Lack of Transportation (Non-Medical): No  Physical Activity: Insufficiently Active (03/24/2022)   Exercise Vital Sign    Days of Exercise per Week: 3 days    Minutes of Exercise per Session: 40 min  Stress: No Stress Concern Present (03/24/2022)   [employer] of Occupational Health - Occupational Stress Questionnaire    Feeling of Stress : Not at all  Social Connections: Moderately Integrated (03/24/2022)   Social Connection and Isolation Panel [NHANES]    Frequency of Communication with Friends and Family: More than three times a week    Frequency of Social Gatherings with Friends and Family: More than three times a week    Attends Religious Services: More than 4 times per year    Active Member of [employer] or Organizations: Yes    Attends Engineer, structural: More than 4 times per year    Marital Status: Divorced     Outpatient Medications Prior to Visit  Medication Sig Dispense Refill   amLODipine (NORVASC) 10 MG tablet TAKE 1 TABLET BY MOUTH EVERY DAY 90 tablet 0   chlorthalidone (HYGROTON) 25 MG tablet TAKE 1 TABLET BY MOUTH EVERY DAY 90 tablet 0   cholecalciferol (VITAMIN D) 1000 UNITS tablet Take 1,000 Units by mouth daily.     colchicine 0.6 MG tablet Take initial dose of 2 tablets tonight. Days 2-13, take one tablet per day. 15 tablet 0   glucose blood (ACCU-CHEK AVIVA PLUS) test strip Use to check blood sugar daily  E11.9 100 each 12   Lancets (ACCU-CHEK SOFT TOUCH) lancets Use to test blood glucose twice daily 50 each 12   levothyroxine (SYNTHROID) 75 MCG tablet TAKE 1 TABLET BY MOUTH EVERY DAY BEFORE BREAKFAST 90 tablet 1   losartan (COZAAR) 100 MG tablet TAKE 1 TABLET (100 MG TOTAL) BY MOUTH DAILY. MAY BEGIN AT 1/2 TABLET DAILY AND INCREASE TO 1 TABLET 90 tablet 1   metoprolol succinate (TOPROL-XL) 25 MG 24 hr tablet TAKE 1 TABLET BY MOUTH EVERY DAY 30 tablet 2   Misc Natural Products (OSTEO BI-FLEX ADV JOINT SHIELD PO) Take 1 tablet by mouth daily.     potassium chloride (KLOR-CON) 10 MEQ tablet TAKE 1 TABLET (10 MEQ TOTAL) BY MOUTH 3 (THREE) TIMES DAILY. 90 tablet 5   No facility-administered medications prior to visit.     EXAM:  There were no vitals taken for this visit.  There is no height or weight on file to calculate BMI. Wt Readings from Last 3 Encounters:  04/01/22 264 lb 12.8 oz (120.1 kg)  03/24/22 251 lb (113.9 kg)  10/15/21 251 lb 12.8 oz (114.2 kg)    Physical Exam: Vital signs reviewed WUJ:WJXB is a well-developed well-nourished alert cooperative    who appearsr stated age in no acute distress.  HEENT: normocephalic atraumatic , Eyes: PERRL EOM's full, conjunctiva clear, Nares: paten,t no deformity discharge or tenderness., Ears: no deformity EAC's clear TMs with normal landmarks. Mouth: clear OP, no lesions, edema.  Moist mucous membranes. Dentition in adequate  repair. NECK: supple without masses, thyromegaly or bruits. CHEST/PULM:  Clear to auscultation and percussion breath sounds equal no wheeze , rales or rhonchi. No chest wall deformities or tenderness. Breast: normal by inspection . No dimpling, discharge, masses, tenderness or discharge . CV: PMI is nondisplaced, S1 S2 no gallops, murmurs, rubs. Peripheral pulses are full without delay.No JVD .  ABDOMEN: Bowel sounds normal nontender  No guard or rebound, no hepato splenomegal no CVA tenderness.  No hernia. Extremtities:  No clubbing cyanosis or edema, no acute joint swelling or redness no focal atrophy NEURO:  Oriented x3, cranial nerves 3-12 appear to be intact, no obvious focal weakness,gait within normal limits no abnormal reflexes or asymmetrical SKIN: No acute rashes normal turgor, color, no bruising or petechiae. PSYCH: Oriented, good eye contact, no obvious depression anxiety, cognition and judgment appear normal. LN: no cervical axillary inguinal adenopathy  Lab Results  Component Value Date   WBC 5.4 04/01/2022   HGB 12.1 04/01/2022   HCT 35.9 (L) 04/01/2022   PLT 169.0 04/01/2022   GLUCOSE 129 (H) 04/01/2022   CHOL 146 04/01/2022   TRIG 109.0 04/01/2022   HDL 55.70 04/01/2022   LDLCALC 69 04/01/2022   ALT 18 04/01/2022   AST 14 04/01/2022   NA 139 04/01/2022   K 4.0 04/01/2022   CL 103 04/01/2022   CREATININE 1.27 (H) 04/01/2022   BUN 38 (H) 04/01/2022   CO2 27 04/01/2022   TSH 4.10 04/01/2022   HGBA1C 7.0 (A) 04/01/2022   MICROALBUR 1.3 04/01/2022    BP Readings from Last 3 Encounters:  08/06/22 (!) 155/57  04/01/22 (!) 126/40  02/07/22 (!) 145/49    Lab results reviewed with patient   ASSESSMENT AND PLAN:  Discussed the following assessment and plan:    ICD-10-CM   1. Morbid obesity (HCC)  E66.01     2. OSA on CPAP  G47.33     3. Hypothyroidism, unspecified type  E03.9  4. Medication management  Z79.899     5. Visit for preventive health  examination  Z00.00     6. Essential hypertension  I10     7. Mild aortic stenosis  I35.0     Update labs bmp? And A1c  ldl is at goal  No follow-ups on file.  Patient Care Team: Garen Woolbright, Neta Mends, MD as PCP - General Pricilla Riffle, MD as PCP - Cardiology (Cardiology) Ovidio Kin, MD (General Surgery) Jeani Hawking, MD (Gastroenterology) Carrington Clamp, DPM (Inactive) (Podiatry) Yates Decamp, MD as Consulting Physician (Cardiology) Charna Elizabeth, MD as Consulting Physician (Gastroenterology) Luciana Axe Alford Highland, MD as Consulting Physician (Ophthalmology) There are no Patient Instructions on file for this visit.  Neta Mends. Leodis Alcocer M.D.

## 2022-09-02 ENCOUNTER — Encounter: Payer: Self-pay | Admitting: Internal Medicine

## 2022-09-02 ENCOUNTER — Ambulatory Visit (INDEPENDENT_AMBULATORY_CARE_PROVIDER_SITE_OTHER): Payer: Medicare HMO | Admitting: Internal Medicine

## 2022-09-02 VITALS — BP 130/60 | HR 74 | Temp 98.9°F | Ht 61.0 in | Wt 268.8 lb

## 2022-09-02 DIAGNOSIS — G4733 Obstructive sleep apnea (adult) (pediatric): Secondary | ICD-10-CM | POA: Diagnosis not present

## 2022-09-02 DIAGNOSIS — I10 Essential (primary) hypertension: Secondary | ICD-10-CM | POA: Diagnosis not present

## 2022-09-02 DIAGNOSIS — I351 Nonrheumatic aortic (valve) insufficiency: Secondary | ICD-10-CM

## 2022-09-02 DIAGNOSIS — Z79899 Other long term (current) drug therapy: Secondary | ICD-10-CM

## 2022-09-02 DIAGNOSIS — E039 Hypothyroidism, unspecified: Secondary | ICD-10-CM

## 2022-09-02 DIAGNOSIS — E669 Obesity, unspecified: Secondary | ICD-10-CM

## 2022-09-02 DIAGNOSIS — M19042 Primary osteoarthritis, left hand: Secondary | ICD-10-CM

## 2022-09-02 DIAGNOSIS — Z0000 Encounter for general adult medical examination without abnormal findings: Secondary | ICD-10-CM | POA: Diagnosis not present

## 2022-09-02 DIAGNOSIS — I350 Nonrheumatic aortic (valve) stenosis: Secondary | ICD-10-CM

## 2022-09-02 DIAGNOSIS — Z8739 Personal history of other diseases of the musculoskeletal system and connective tissue: Secondary | ICD-10-CM

## 2022-09-02 DIAGNOSIS — E1169 Type 2 diabetes mellitus with other specified complication: Secondary | ICD-10-CM

## 2022-09-02 LAB — CBC WITH DIFFERENTIAL/PLATELET
Basophils Absolute: 0 10*3/uL (ref 0.0–0.1)
Basophils Relative: 0.4 % (ref 0.0–3.0)
Eosinophils Absolute: 0.2 10*3/uL (ref 0.0–0.7)
Eosinophils Relative: 3.7 % (ref 0.0–5.0)
HCT: 37.5 % (ref 36.0–46.0)
Hemoglobin: 12.1 g/dL (ref 12.0–15.0)
Lymphocytes Relative: 27.3 % (ref 12.0–46.0)
Lymphs Abs: 1.4 10*3/uL (ref 0.7–4.0)
MCHC: 32.2 g/dL (ref 30.0–36.0)
MCV: 88.8 fl (ref 78.0–100.0)
Monocytes Absolute: 0.5 10*3/uL (ref 0.1–1.0)
Monocytes Relative: 8.9 % (ref 3.0–12.0)
Neutro Abs: 3.2 10*3/uL (ref 1.4–7.7)
Neutrophils Relative %: 59.7 % (ref 43.0–77.0)
Platelets: 165 10*3/uL (ref 150.0–400.0)
RBC: 4.22 Mil/uL (ref 3.87–5.11)
RDW: 14.6 % (ref 11.5–15.5)
WBC: 5.3 10*3/uL (ref 4.0–10.5)

## 2022-09-02 LAB — BASIC METABOLIC PANEL
BUN: 29 mg/dL — ABNORMAL HIGH (ref 6–23)
CO2: 22 mEq/L (ref 19–32)
Calcium: 9.8 mg/dL (ref 8.4–10.5)
Chloride: 107 mEq/L (ref 96–112)
Creatinine, Ser: 1.24 mg/dL — ABNORMAL HIGH (ref 0.40–1.20)
GFR: 41.43 mL/min — ABNORMAL LOW (ref >60.00)
Glucose, Bld: 118 mg/dL — ABNORMAL HIGH (ref 70–99)
Potassium: 3.7 mEq/L (ref 3.5–5.1)
Sodium: 140 mEq/L (ref 135–145)

## 2022-09-02 LAB — TSH: TSH: 4.32 u[IU]/mL (ref 0.35–5.50)

## 2022-09-02 LAB — HEMOGLOBIN A1C: Hgb A1c MFr Bld: 7.4 % — ABNORMAL HIGH (ref 4.6–6.5)

## 2022-09-02 MED ORDER — LOSARTAN POTASSIUM 100 MG PO TABS: 100.0000 mg | ORAL_TABLET | Freq: Every day | ORAL | 1 refills | Status: DC

## 2022-09-02 MED ORDER — CHLORTHALIDONE 25 MG PO TABS: 25.0000 mg | ORAL_TABLET | Freq: Every day | ORAL | 1 refills | Status: DC

## 2022-09-02 NOTE — Patient Instructions (Addendum)
Good to see you today . Can try topical OTC voltaren( diclofenac gel)  for the finger arthritis if needed.  If   progressive  problem any have you  may end up seeing  hand specialist .  Repeat bp 130/60  have cariology follow up also  Lab today   Consider   semiglutide  pill form or injectable .  Plan fu in 4 months depending on  labs results   Can take colchicine as needed for gout attack in interim

## 2022-09-04 NOTE — Progress Notes (Signed)
A1c is up to 7.4   ok but not as good as previous at 7  kidney function about the same  Blood count and thyroid are in normal range . Consider adding  meds such as farxiga or jardiance which are pills that make you excrete sugar in urine  ... or the glp 1 such as ozempic or  rybeslus( semiglutide we discussed ) both are assoc with some weight loss  .   Either way would fu in 3-4 months. Let me know what way she wants to go .

## 2022-09-24 ENCOUNTER — Encounter (INDEPENDENT_AMBULATORY_CARE_PROVIDER_SITE_OTHER): Payer: Medicare HMO | Admitting: Ophthalmology

## 2022-09-24 LAB — HM DIABETES EYE EXAM

## 2022-09-27 ENCOUNTER — Other Ambulatory Visit: Payer: Self-pay | Admitting: Internal Medicine

## 2022-09-30 ENCOUNTER — Ambulatory Visit
Admission: RE | Admit: 2022-09-30 | Discharge: 2022-09-30 | Disposition: A | Discharge status: Home / Self Care | Payer: Medicare HMO | Source: Ambulatory Visit | Attending: Family Medicine | Admitting: Family Medicine

## 2022-09-30 DIAGNOSIS — Z13820 Encounter for screening for osteoporosis: Secondary | ICD-10-CM

## 2022-10-06 ENCOUNTER — Ambulatory Visit: Payer: Medicare HMO | Admitting: Internal Medicine

## 2022-10-06 NOTE — Progress Notes (Unsigned)
Cardiology Office Note   Date:  10/06/2022   ID:  Bobbiesue, Verstraete 02/16/1945, MRN [id number]  PCP:  Madelin Headings, MD  Cardiologist:  Dr. Tenny Craw, MD   No chief complaint on file.   History of Present Illness: Rebecca Rich is a 77 y.o. female  with hx of mild AS, HTN, SVT, OSA (on CPAP), DM, HL, obesity  and gout.   She had SVT when hospitalized in 2019 (post op)   Echo in 2019 showed very mild AS.    Myoviw in 2020 was negative for ischemia    Last echo in 2020 showed mild AS with mean gradient of 13 mm Hg     The pt was last seen in cardiology by Arelia Longest in 2022.    She says her breathing is OK   She denies CP   No dizziness  No palpitations     I saw the pt in June 2023   Past Medical History:  Diagnosis Date   Acute bronchospasm 08/24/2009   Acute lower GI bleeding 02/16/2017   Acute perforated appendicitis w abscess s/p lap appendectomy 12/30/2017 12/30/2017   Acute sphenoidal sinusitis 02/26/2010   Qualifier: Diagnosis of  By: Fabian Sharp MD, Neta Mends    CARDIAC MURMUR 12/08/2006   COLONIC POLYPS, HX OF 12/08/2006   DIABETES MELLITUS, TYPE II 12/08/2006   Gallbladder/common duct stone, without infection, with obstruction 03/01/2010   removed ercp   HYPERLIPIDEMIA 12/08/2006   HYPERTENSION 12/08/2006   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   INFECTION, 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   LIVER FUNCTION TESTS, ABNORMAL 07/28/2008   Morbid obesity (HCC) 12/08/2006   Nuclear sclerotic cataract of both eyes 03/26/2020   OBESITY 09/24/2009   OBSTRUCTIVE SLEEP APNEA 12/08/2006   OSTEOARTHRITIS 12/08/2006   RUQ PAIN 06/16/2008   SHOULDER PAIN, RIGHT 02/07/2008   Sleep apnea    Swelling of limb 07/28/2008   THYROID FUNCTION TEST, ABNORMAL 12/08/2006    Past Surgical History:  Procedure Laterality Date   ABDOMINAL HYSTERECTOMY     CHOLECYSTECTOMY  06/27/2008   COLONOSCOPY N/A 12/13/2012   Procedure: COLONOSCOPY;  Surgeon: Charna Elizabeth, MD;  Location: [facility];   Service: Endoscopy;  Laterality: N/A;   COLONOSCOPY Left 02/17/2017   Procedure: COLONOSCOPY;  Surgeon: Jeani Hawking, MD;  Location: [facility] END[facility]COPY;  Service: Endoscopy;  Laterality: Left;   common duct stone     ERCP    EYE SURGERY     LAPAROSCOPIC APPENDECTOMY N/A 12/30/2017   Procedure: APPENDECTOMY LAPAROSCOPIC;  Surgeon: Almond Lint, MD;  Location: [facility];  Service: General;  Laterality: N/A;     Current Outpatient Medications  Medication Sig Dispense Refill   amLODipine (NORVASC) 10 MG tablet TAKE 1 TABLET BY MOUTH EVERY DAY 90 tablet 0   chlorthalidone (HYGROTON) 25 MG tablet Take 1 tablet (25 mg total) by mouth daily. 90 tablet 1   cholecalciferol (VITAMIN D) 1000 UNITS tablet Take 1,000 Units by mouth daily.     colchicine 0.6 MG tablet Take initial dose of 2 tablets tonight. Days 2-13, take one tablet per day. 15 tablet 0   glucose blood (ACCU-CHEK AVIVA PLUS) test strip Use to check blood sugar daily E11.9 100 each 12   Lancets (ACCU-CHEK SOFT TOUCH) lancets Use to test blood glucose twice daily 50 each 12   levothyroxine (SYNTHROID) 75 MCG tablet TAKE 1 TABLET BY MOUTH EVERY DAY BEFORE BREAKFAST 90 tablet 1   losartan (COZAAR)  100 MG tablet Take 1 tablet (100 mg total) by mouth daily. 90 tablet 1   metoprolol succinate (TOPROL-XL) 25 MG 24 hr tablet TAKE 1 TABLET BY MOUTH EVERY DAY 90 tablet 0   Misc Natural Products (OSTEO BI-FLEX ADV JOINT SHIELD PO) Take 1 tablet by mouth daily.     potassium chloride (KLOR-CON) 10 MEQ tablet TAKE 1 TABLET (10 MEQ TOTAL) BY MOUTH 3 (THREE) TIMES DAILY. 90 tablet 5   No current facility-administered medications for this visit.    Allergies:   Patient has no known allergies.    Social History:  The patient  reports that she has never smoked. She has never used smokeless tobacco. She reports that she does not drink alcohol and does not use drugs.   Family History:  The patient's family history includes Cancer in her father and mother;  Cervical cancer in her mother; Diabetes in her brother, brother, brother, sister, sister, and another family member; Heart disease in her brother, brother, brother, and sister; Other in her mother; Stroke in her sister; Throat cancer in her father.    ROS:  Please see the history of present illness. Otherwise, review of systems are positive for none.   All other systems are reviewed and negative.    PHYSICAL EXAM: VS:  There were no vitals taken for this visit. , BMI There is no height or weight on file to calculate BMI.   General: Morbidly obese, in NAD Neck: Negative for carotid bruits. No JVD Lungs:Clear to ausculation bilaterally.   Cardiovascular: RRR with S1 S2  Gr II/VI systolic murmur LSB   Extremities: Tr edema RLE Greater than LLE   Radial pulses 2+ bilaterally Neuro: Alert and oriented. No focal deficits. No facial asymmetry. MAE spontaneously. Psych: Responds to questions appropriately with normal affect.     EKG:  EKG is ordered today.  SR  73 bpm  First degree AV block  PR 232      Recent Labs: 04/01/2022: ALT 18 09/02/2022: BUN 29; Creatinine, Ser 1.24; Hemoglobin 12.1; Platelets 165.0; Potassium 3.7; Sodium 140; TSH 4.32    Lipid Panel    Component Value Date/Time   CHOL 146 04/01/2022 1029   TRIG 109.0 04/01/2022 1029   HDL 55.70 04/01/2022 1029   CHOLHDL 3 04/01/2022 1029   VLDL 21.8 04/01/2022 1029   LDLCALC 69 04/01/2022 1029     Wt Readings from Last 3 Encounters:  09/02/22 268 lb 12.8 oz (121.9 kg)  04/01/22 264 lb 12.8 oz (120.1 kg)  03/24/22 251 lb (113.9 kg)    Other studies Reviewed: Additional studies/ records that were reviewed today include:  Review of the above records demonstrates:   Echocardiogram 12/15/2018 IMPRESSIONS    1. Left ventricular ejection fraction, by visual estimation, is 60 to 65%. The left ventricle has normal function. There is moderately increased left ventricular hypertrophy.  2. Left ventricular diastolic parameters are  consistent with Grade II diastolic dysfunction (pseudonormalization).  3. Global right ventricle has normal systolic function.The right ventricular size is normal. No increase in right ventricular wall thickness.  4. Left atrial size was mildly dilated.  5. Right atrial size was normal.  6. Moderate calcification of the mitral valve leaflet(s).  7. Moderate thickening of the mitral valve leaflet(s).  8. The mitral valve is normal in structure. Trace mitral valve regurgitation.  9. The tricuspid valve is normal in structure. Tricuspid valve regurgitation is mild. 10. The aortic valve has an indeterminant number of cusps. Aortic valve  regurgitation is mild. Mild aortic valve stenosis. 11. The pulmonic valve was grossly normal. Pulmonic valve regurgitation is mild.  ASSESSMENT AND PLAN:  1.   AS     Mild On echo 2020  Will repeat       2  HTN   Good control  Continue losartan, chlortalidone, metoprolol       Last K 3.8    3  hx SVT    No recurrence    One episode  Keep on toprol  4.  Diabetes Last A1C in May 2023 was 7   Watch carbs    5.Obesity: Contiue to watch diet, carbs      F/U in APril    Current medicines are reviewed at length with the patient today.  The patient does not have concerns regarding medicines. Disposition:   FU with Dr. Tenny Craw in 1 year  Signed, Dietrich Pates, MD  10/06/2022 8:53 PM    [facility] [address] [place], [place], [place]  [address] Phone: [phone]; Fax: [phone] \

## 2022-10-06 NOTE — Progress Notes (Signed)
Bone density is normal

## 2022-10-07 ENCOUNTER — Encounter: Payer: Self-pay | Admitting: Internal Medicine

## 2022-10-07 ENCOUNTER — Ambulatory Visit: Payer: Medicare HMO | Attending: Internal Medicine | Admitting: Internal Medicine

## 2022-10-07 VITALS — BP 110/60 | HR 81 | Ht 61.0 in | Wt 268.8 lb

## 2022-10-07 DIAGNOSIS — I10 Essential (primary) hypertension: Secondary | ICD-10-CM

## 2022-10-07 DIAGNOSIS — Z13220 Encounter for screening for lipoid disorders: Secondary | ICD-10-CM

## 2022-10-07 DIAGNOSIS — I350 Nonrheumatic aortic (valve) stenosis: Secondary | ICD-10-CM

## 2022-10-07 DIAGNOSIS — I517 Cardiomegaly: Secondary | ICD-10-CM | POA: Diagnosis not present

## 2022-10-07 NOTE — Patient Instructions (Signed)
Medication Instructions:   *If you need a refill on your cardiac medications before your next appointment, please call your pharmacy*   Lab Work:  NMR, APO B, LIPO A  If you have labs (blood work) drawn today and your tests are completely normal, you will receive your results only by: MyChart Message (if you have MyChart) OR A paper copy in the mail If you have any lab test that is abnormal or we need to change your treatment, we will call you to review the results.   Testing/Procedures:    Follow-Up: At [facility], you and your health needs are our priority.  As part of our continuing mission to provide you with exceptional heart care, we have created designated Provider Care Teams.  These Care Teams include your primary Cardiologist (physician) and Advanced Practice Providers (APPs -  Physician Assistants and Nurse Practitioners) who all work together to provide you with the care you need, when you need it.  We recommend signing up for the patient portal called "MyChart".  Sign up information is provided on this After Visit Summary.  MyChart is used to connect with patients for Virtual Visits (Telemedicine).  Patients are able to view lab/test results, encounter notes, upcoming appointments, etc.  Non-urgent messages can be sent to your provider as well.   To learn more about what you can do with MyChart, go to [email or web address].    Your next appointment:   6 month(s)  Provider:   Dietrich Pates, MD     Other Instructions

## 2022-10-08 LAB — NMR, LIPOPROFILE
Cholesterol, Total: 160 mg/dL (ref 100–199)
HDL Particle Number: 32.1 umol/L (ref >=30.5)
HDL-C: 64 mg/dL (ref >39)
LDL Particle Number: 1129 nmol/L — ABNORMAL HIGH (ref <1000)
LDL Size: 21.1 nm (ref >20.5)
LDL-C (NIH Calc): 77 mg/dL (ref 0–99)
LP-IR Score: 35 (ref <=45)
Small LDL Particle Number: 558 nmol/L — ABNORMAL HIGH (ref <=527)
Triglycerides: 109 mg/dL (ref 0–149)

## 2022-10-08 LAB — APOLIPOPROTEIN B: Apolipoprotein B: 71 mg/dL (ref <90)

## 2022-10-08 LAB — LIPOPROTEIN A (LPA): Lipoprotein (a): 112.8 nmol/L — ABNORMAL HIGH (ref <75.0)

## 2022-10-13 ENCOUNTER — Telehealth: Payer: Self-pay | Admitting: *Deleted

## 2022-10-13 DIAGNOSIS — Z79899 Other long term (current) drug therapy: Secondary | ICD-10-CM

## 2022-10-13 DIAGNOSIS — E1169 Type 2 diabetes mellitus with other specified complication: Secondary | ICD-10-CM

## 2022-10-13 DIAGNOSIS — E782 Mixed hyperlipidemia: Secondary | ICD-10-CM

## 2022-10-13 MED ORDER — ROSUVASTATIN CALCIUM 5 MG PO TABS: 5.0000 mg | ORAL_TABLET | Freq: Every day | ORAL | 1 refills | Status: DC

## 2022-10-13 NOTE — Telephone Encounter (Signed)
The patient has been notified of the result and verbalized understanding.  All questions (if any) were answered.  Pt aware to start taking crestor 5 mg po daily and come in for repeat lipomed and LFTs in 8 weeks to reassess.   Confirmed the pharmacy of choice with the pt.  Scheduled the pt for repeat lipomed and LFTs in 8 weeks on 12/09/22.  She is aware to come fasting to this lab appt.  Pt verbalized understanding and agrees with this plan.

## 2022-10-13 NOTE — Telephone Encounter (Signed)
-----   Message from [place] sent at 10/09/2022  5:19 PM EDT ----- LDL is 77 with higher particle numbers  Lpa is elevated  With diabetes and above I would recomm 5 mg Crestor to lower LDL further    Follow up lipomed and liver panel in 8 wks

## 2022-10-20 ENCOUNTER — Other Ambulatory Visit: Payer: Self-pay | Admitting: Internal Medicine

## 2022-11-12 ENCOUNTER — Ambulatory Visit (INDEPENDENT_AMBULATORY_CARE_PROVIDER_SITE_OTHER): Payer: Medicare HMO | Admitting: Podiatry

## 2022-11-12 ENCOUNTER — Encounter: Payer: Self-pay | Admitting: Podiatry

## 2022-11-12 DIAGNOSIS — M79609 Pain in unspecified limb: Secondary | ICD-10-CM

## 2022-11-12 DIAGNOSIS — B351 Tinea unguium: Secondary | ICD-10-CM | POA: Diagnosis not present

## 2022-11-12 DIAGNOSIS — E119 Type 2 diabetes mellitus without complications: Secondary | ICD-10-CM | POA: Diagnosis not present

## 2022-11-12 NOTE — Progress Notes (Signed)
  Subjective:  Patient ID: Rebecca Rich, female    DOB: 11-16-1945,  MRN: [id number]  77 y.o. female presents preventative diabetic foot care and painful, discolored, thick toenails which interfere with daily activities  Chief Complaint  Patient presents with   Diabetes    [facility]- BS-145 today  A1C- done 6.8 3/24 PCP-12/24 next appt Patient states discoloration on right foot toes     New problem(s): None   PCP is Panosh, Neta Mends, MD , and last visit was September 02, 2022.  No Known Allergies  Review of Systems: Negative except as noted in the HPI.   Objective:  Rebecca Rich is a pleasant 77 y.o. female in NAD.Marland Kitchen AAO x 3.  Vascular Examination: Vascular status intact b/l with palpable pedal pulses. CFT immediate b/l. Pedal hair present. No edema. No pain with calf compression b/l. Skin temperature gradient WNL b/l. No varicosities noted. No cyanosis or clubbing noted.  Neurological Examination: Sensation grossly intact b/l with 10 gram monofilament. Vibratory sensation intact b/l.  Dermatological Examination: Pedal skin with normal turgor, texture and tone b/l. No open wounds nor interdigital macerations noted. Toenails 1-5 b/l thick, discolored, elongated with subungual debris and pain on dorsal palpation. No hyperkeratotic lesions noted b/l.   Musculoskeletal Examination: Muscle strength 5/5 to b/l LE.  No pain, crepitus noted b/l. No gross pedal deformities. Patient ambulates independently without assistive aids.   Radiographs: None  Last A1c:      Latest Ref Rng & Units 09/02/2022   10:51 AM 04/01/2022    9:43 AM  Hemoglobin A1C  Hemoglobin-A1c 4.6 - 6.5 % 7.4  7.0      Assessment:   1. Pain due to onychomycosis of nail   2. Diabetes mellitus without complication (HCC)    Plan:  -Consent given for treatment as described below: -Examined patient. -Continue foot and shoe inspections daily. Monitor blood glucose per PCP/Endocrinologist's recommendations. -Continue  supportive shoe gear daily. -Mycotic toenails 1-5 bilaterally were debrided in length and girth with sterile nail nippers and dremel without incident. -Patient/POA to call should there be question/concern in the interim.  Return in about 3 months (around 02/12/2023).  Freddie Breech, DPM

## 2022-12-09 ENCOUNTER — Ambulatory Visit: Payer: Medicare HMO | Attending: Internal Medicine

## 2022-12-09 DIAGNOSIS — E782 Mixed hyperlipidemia: Secondary | ICD-10-CM

## 2022-12-09 DIAGNOSIS — Z79899 Other long term (current) drug therapy: Secondary | ICD-10-CM

## 2022-12-09 DIAGNOSIS — E669 Obesity, unspecified: Secondary | ICD-10-CM

## 2022-12-10 LAB — NMR, LIPOPROFILE
Cholesterol, Total: 116 mg/dL (ref 100–199)
HDL Particle Number: 37.6 umol/L (ref >=30.5)
HDL-C: 63 mg/dL (ref >39)
LDL Particle Number: 593 nmol/L (ref <1000)
LDL Size: 21.4 nm (ref >20.5)
LDL-C (NIH Calc): 38 mg/dL (ref 0–99)
LP-IR Score: 37 (ref <=45)
Small LDL Particle Number: 260 nmol/L (ref <=527)
Triglycerides: 73 mg/dL (ref 0–149)

## 2022-12-10 LAB — HEPATIC FUNCTION PANEL
ALT: 16 [IU]/L (ref 0–32)
AST: 16 [IU]/L (ref 0–40)
Albumin: 4 g/dL (ref 3.8–4.8)
Alkaline Phosphatase: 84 [IU]/L (ref 44–121)
Bilirubin Total: 0.3 mg/dL (ref 0.0–1.2)
Bilirubin, Direct: 0.13 mg/dL (ref 0.00–0.40)
Total Protein: 6.5 g/dL (ref 6.0–8.5)

## 2022-12-17 ENCOUNTER — Encounter (HOSPITAL_BASED_OUTPATIENT_CLINIC_OR_DEPARTMENT_OTHER): Payer: Self-pay | Admitting: Pulmonary Disease

## 2022-12-17 ENCOUNTER — Ambulatory Visit (INDEPENDENT_AMBULATORY_CARE_PROVIDER_SITE_OTHER): Payer: Medicare HMO | Admitting: Pulmonary Disease

## 2022-12-17 VITALS — BP 112/48 | HR 84 | Resp 20 | Ht 61.0 in | Wt 266.8 lb

## 2022-12-17 DIAGNOSIS — I10 Essential (primary) hypertension: Secondary | ICD-10-CM

## 2022-12-17 DIAGNOSIS — G4733 Obstructive sleep apnea (adult) (pediatric): Secondary | ICD-10-CM

## 2022-12-17 NOTE — Progress Notes (Unsigned)
   Subjective:    Patient ID: Rebecca Rich, female    DOB: Jun 07, 1945, 77 y.o.   MRN: [id number]  HPI 77 yo woman for follow-up of severe OSA    PMH : diastolic CHF, mild AS, HTN, idiopathic cardiomegaly, SVT, DM, hypothyroid, HLD, morbid obesity   Annual follow-up visit She is maintained on a BiPAP machine with auto settings she used to be on a fullface mask but her dentist felt this was causing gum recession.  She has switched to nasal mask with chinstrap and is comfortable with this.  She reports good usage of her machine every night and cannot sleep without her machine.  She denies any problems with pressure Blood pressure slightly low today, this is uncommon for her generally it runs high. She is on amlodipine, chlorthalidone, losartan and metoprolol.    Significant tests/ events reviewed   06/2018 PAP titration >> was severe and CPAP was inadequate but was corrected by BiPAP 25/20 cm 02/2018 HST >> severe OSA with 76/h and  severe desaturations. 08/1993 NPSG -at [facility] 85/hour corrected by CPAP 10 cm   12/1998-weight 3 1 4  pounds-CPAP titration 17 cm  Review of Systems neg for any significant sore throat, dysphagia, itching, sneezing, nasal congestion or excess/ purulent secretions, fever, chills, sweats, unintended wt loss, pleuritic or exertional cp, hempoptysis, orthopnea pnd or change in chronic leg swelling. Also denies presyncope, palpitations, heartburn, abdominal pain, nausea, vomiting, diarrhea or change in bowel or urinary habits, dysuria,hematuria, rash, arthralgias, visual complaints, headache, numbness weakness or ataxia.     Objective:   Physical Exam  Gen. Pleasant, obese, in no distress ENT - no lesions, no post nasal drip Neck: No JVD, no thyromegaly, no carotid bruits Lungs: no use of accessory muscles, no dullness to percussion, decreased without rales or rhonchi  Cardiovascular: Rhythm regular, heart sounds  normal, no murmurs or gallops, no peripheral  edema Musculoskeletal: No deformities, no cyanosis or clubbing , no tremors       Assessment & Plan:

## 2022-12-17 NOTE — Patient Instructions (Signed)
X change IPAP max to 20 cm X Rx for chin strap

## 2022-12-18 NOTE — Assessment & Plan Note (Signed)
Compliance report was reviewed and this shows excellent compliance more than 5 hours per night.  She has mild leak.  Average pressure is 17.5/12.5.  She has good control of events. We will decrease IPAP maximum to 20 and maintain auto bilevel settings BiPAP is only helped improve her daytime somnolence and fatigue. Weight loss encouraged, compliance with goal of at least 4-6 hrs every night is the expectation. Advised against medications with sedative side effects Cautioned against driving when sleepy - understanding that sleepiness will vary on a day to day basis

## 2022-12-18 NOTE — Assessment & Plan Note (Addendum)
Blood pressure appears lower than she normally runs today.  Should follow-up with PCP and check her own readings

## 2023-01-01 ENCOUNTER — Other Ambulatory Visit: Payer: Self-pay | Admitting: Internal Medicine

## 2023-01-06 ENCOUNTER — Telehealth: Payer: Self-pay

## 2023-01-06 ENCOUNTER — Ambulatory Visit: Payer: Medicare HMO | Admitting: Internal Medicine

## 2023-01-06 NOTE — Telephone Encounter (Signed)
Attempted to reach pt to r/s her appt.  Left a voicemail to call us back.

## 2023-01-12 ENCOUNTER — Telehealth: Payer: Self-pay

## 2023-01-12 NOTE — Telephone Encounter (Signed)
Received a fax from [employer] of pt glucose and Blood Pressure log.   Form placed in provider's green folder.

## 2023-01-26 NOTE — Progress Notes (Signed)
 Chief Complaint  Patient presents with   Medical Management of Chronic Issues    HPI: Rebecca Rich 77 y.o. come in for Chronic disease management  DM no meds her preference  not eating as healthy in past month( [facility] diet) eating out bk fast some  BPCV no new sx   Obesity: burst of weight gain in one week  ;ast week  264 + 274  dwwla is central.  No recent  water aerobics.  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  but plan on getting back   ROS: See pertinent positives and negatives per HPI.no new fever cp sob falling foot ulcers  cough bp ok needs refill meds   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   INFECTION, 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   LIVER FUNCTION TESTS, ABNORMAL 07/28/2008   Morbid obesity (HCC) 12/08/2006   Nuclear sclerotic cataract of both eyes 03/26/2020   OBESITY 09/24/2009   OBSTRUCTIVE SLEEP APNEA 12/08/2006   OSTEOARTHRITIS 12/08/2006   RUQ PAIN 06/16/2008   SHOULDER PAIN, RIGHT 02/07/2008   Sleep apnea    Swelling of limb 07/28/2008   THYROID  FUNCTION TEST, ABNORMAL 12/08/2006    Family History  Problem Relation Age of Onset   Other Mother        blood clots   Cervical cancer Mother    Cancer Mother    Heart disease Brother    Diabetes Brother    Heart disease Sister    Diabetes Sister    Heart disease Brother    Diabetes Brother    Heart disease Brother    Diabetes Brother    Stroke Sister    Diabetes  Sister    Throat cancer Father    Cancer Father    Diabetes Other        all siblings, 4 brothers, 5 sisters    Social History   Socioeconomic History   Marital status: Divorced    Spouse name: Not on file   Number of children: Not on file   Years of education: Not on file   Highest education level: Bachelor's degree (e.g., BA, AB, BS)  Occupational History   Occupation: retired    Associate Professor: RETIRED  Tobacco Use   Smoking status: Never   Smokeless tobacco: Never  Vaping Use   Vaping status: Never Used  Substance and Sexual Activity   Alcohol use: No    Alcohol/week: 0.0 standard drinks of alcohol   Drug use: No   Sexual activity: Not on file  Other Topics Concern   Not on file  Social History Narrative   Master level education in math   Pt is currently retired   Pt is divorced with children   Recently had to move had a break in and thus away from her pool exercise   Social Drivers of Health   Financial Resource Strain: Low Risk  (01/26/2023)   Overall Financial Resource Strain (CARDIA)    Difficulty of  Paying Living Expenses: Not hard at all  Food Insecurity: No Food Insecurity (01/26/2023)   Hunger Vital Sign    Worried About Running Out of Food in the Last Year: Never true    Ran Out of Food in the Last Year: Never true  Transportation Needs: No Transportation Needs (01/26/2023)   PRAPARE - Administrator, Civil Service (Medical): No    Lack of Transportation (Non-Medical): No  Physical Activity: Insufficiently Active (01/26/2023)   Exercise Vital Sign    Days of Exercise per Week: 2 days    Minutes of Exercise per Session: 20 min  Stress: Stress Concern Present (01/26/2023)   [employer] of Occupational Health - Occupational Stress Questionnaire    Feeling of Stress : To some extent  Social Connections: Moderately Integrated (01/26/2023)   Social Connection and Isolation Panel [NHANES]    Frequency of Communication with Friends and Family:  More than three times a week    Frequency of Social Gatherings with Friends and Family: Three times a week    Attends Religious Services: More than 4 times per year    Active Member of Clubs or Organizations: Yes    Attends Banker Meetings: More than 4 times per year    Marital Status: Divorced    Outpatient Medications Prior to Visit  Medication Sig Dispense Refill   amLODipine  (NORVASC ) 10 MG tablet TAKE 1 TABLET BY MOUTH EVERY DAY 90 tablet 3   cholecalciferol (VITAMIN D) 1000 UNITS tablet Take 1,000 Units by mouth daily.     colchicine  0.6 MG tablet Take initial dose of 2 tablets tonight. Days 2-13, take one tablet per day. 15 tablet 0   glucose blood (ACCU-CHEK AVIVA PLUS) test strip Use to check blood sugar daily E11.9 100 each 12   Lancets (ACCU-CHEK SOFT TOUCH) lancets Use to test blood glucose twice daily 50 each 12   metoprolol  succinate (TOPROL -XL) 25 MG 24 hr tablet TAKE 1 TABLET BY MOUTH EVERY DAY 90 tablet 2   Misc Natural Products (OSTEO BI-FLEX ADV JOINT SHIELD PO) Take 1 tablet by mouth daily.     potassium chloride  (KLOR-CON ) 10 MEQ tablet TAKE 1 TABLET (10 MEQ TOTAL) BY MOUTH 3 (THREE) TIMES DAILY. 90 tablet 5   rosuvastatin  (CRESTOR ) 5 MG tablet Take 1 tablet (5 mg total) by mouth daily. 90 tablet 1   chlorthalidone  (HYGROTON ) 25 MG tablet Take 1 tablet (25 mg total) by mouth daily. 90 tablet 1   levothyroxine  (SYNTHROID ) 75 MCG tablet TAKE 1 TABLET BY MOUTH EVERY DAY BEFORE BREAKFAST 90 tablet 1   losartan  (COZAAR ) 100 MG tablet Take 1 tablet (100 mg total) by mouth daily. 90 tablet 1   No facility-administered medications prior to visit.     EXAM:  BP (!) 128/50 (BP Location: Right Arm, Patient Position: Sitting, Cuff Size: Large)   Pulse 78   Temp 98.2 F (36.8 C) (Oral)   Wt 274 lb 12.8 oz (124.6 kg)   SpO2 97%   BMI 51.92 kg/m   Body mass index is 51.92 kg/m.  GENERAL: vitals reviewed and listed above, alert, oriented, appears well  hydrated and in no acute distress HEENT: atraumatic, conjunctiva  clear, no obvious abnormalities on inspection of external nose and ears  NECK: no obvious masses on inspection palpation  LUNGS: clear to auscultation bilaterally, no wheezes, rales or rhonchi, good air movement CV: HRRR,2/6 short sem upper sternal borders  no clubbing cyanosis puffy 1+ edema static no  skin changes nl cap refill  MS: moves all extremities without noticeable focal  abnormality walks with cane as needed  feet no ulcers  thickened toenails.  PSYCH: pleasant and cooperative, no obvious depression or anxiety Lab Results  Component Value Date   WBC 5.3 09/02/2022   HGB 12.1 09/02/2022   HCT 37.5 09/02/2022   PLT 165.0 09/02/2022   GLUCOSE 118 (H) 09/02/2022   CHOL 146 04/01/2022   TRIG 109.0 04/01/2022   HDL 55.70 04/01/2022   LDLCALC 69 04/01/2022   ALT 16 12/09/2022   AST 16 12/09/2022   NA 140 09/02/2022   K 3.7 09/02/2022   CL 107 09/02/2022   CREATININE 1.24 (H) 09/02/2022   BUN 29 (H) 09/02/2022   CO2 22 09/02/2022   TSH 4.32 09/02/2022   HGBA1C 7.1 (A) 01/27/2023   MICROALBUR 1.3 04/01/2022   BP Readings from Last 3 Encounters:  01/27/23 (!) 128/50  12/17/22 (!) 112/48  10/07/22 110/60    ASSESSMENT AND PLAN:  Discussed the following assessment and plan:  Type 2 diabetes mellitus with obesity (HCC) - Plan: POC HgB A1c  Medication management  Essential hypertension  Morbid obesity (HCC)  Mild aortic stenosis  OSA on CPAP A1c acceptable down from prev  Weight gain could be explained by change in ls over past 1-2 months and to re institute  No signs of acute hf other . BP controlled and ok to refill meds  Sleep apnea ok had ? About deep sleep  advise optimize weight loss  activity etc  Counsel plus record review and plan diet exercise meds and goals Shared Decision Making 32 minutes  Fu in 4 months  -Patient advised to return or notify health care team  if  new concerns  arise.  Patient Instructions  I agree getting back on exercise .  Watch sodium  content of food    Decrease portion size when go out to eat.   A1c is a bit better  7.1 down from 7.4   Wt Readings from Last 3 Encounters:  01/27/23 274 lb 12.8 oz (124.6 kg)  12/17/22 266 lb 12.8 oz (121 kg)  10/07/22 268 lb 12.8 oz (121.9 kg)      K. Seann Genther M.D.

## 2023-01-27 ENCOUNTER — Ambulatory Visit: Payer: Medicare HMO | Admitting: Internal Medicine

## 2023-01-27 ENCOUNTER — Encounter: Payer: Self-pay | Admitting: Internal Medicine

## 2023-01-27 VITALS — BP 128/50 | HR 78 | Temp 98.2°F | Wt 274.8 lb

## 2023-01-27 DIAGNOSIS — E669 Obesity, unspecified: Secondary | ICD-10-CM | POA: Diagnosis not present

## 2023-01-27 DIAGNOSIS — E1169 Type 2 diabetes mellitus with other specified complication: Secondary | ICD-10-CM | POA: Diagnosis not present

## 2023-01-27 DIAGNOSIS — G4733 Obstructive sleep apnea (adult) (pediatric): Secondary | ICD-10-CM

## 2023-01-27 DIAGNOSIS — Z79899 Other long term (current) drug therapy: Secondary | ICD-10-CM

## 2023-01-27 DIAGNOSIS — I10 Essential (primary) hypertension: Secondary | ICD-10-CM

## 2023-01-27 DIAGNOSIS — I350 Nonrheumatic aortic (valve) stenosis: Secondary | ICD-10-CM

## 2023-01-27 LAB — POCT GLYCOSYLATED HEMOGLOBIN (HGB A1C): Hemoglobin A1C: 7.1 % — AB (ref 4.0–5.6)

## 2023-01-27 MED ORDER — CHLORTHALIDONE 25 MG PO TABS: 25.0000 mg | ORAL_TABLET | Freq: Every day | ORAL | 1 refills | Status: DC

## 2023-01-27 MED ORDER — LOSARTAN POTASSIUM 100 MG PO TABS: 100.0000 mg | ORAL_TABLET | Freq: Every day | ORAL | 1 refills | Status: DC

## 2023-01-27 MED ORDER — LEVOTHYROXINE SODIUM 75 MCG PO TABS: ORAL_TABLET | ORAL | 1 refills | Status: DC

## 2023-01-27 NOTE — Patient Instructions (Addendum)
 I agree getting back on exercise .  Watch sodium  content of food    Decrease portion size when go out to eat.   A1c is a bit better  7.1 down from 7.4   Wt Readings from Last 3 Encounters:  01/27/23 274 lb 12.8 oz (124.6 kg)  12/17/22 266 lb 12.8 oz (121 kg)  10/07/22 268 lb 12.8 oz (121.9 kg)

## 2023-02-04 ENCOUNTER — Telehealth: Payer: Self-pay | Admitting: Internal Medicine

## 2023-02-04 MED ORDER — ROSUVASTATIN CALCIUM 5 MG PO TABS: 5.0000 mg | ORAL_TABLET | Freq: Every day | ORAL | 2 refills | Status: DC

## 2023-02-04 NOTE — Telephone Encounter (Signed)
 RX sent to requested Pharmacy

## 2023-02-04 NOTE — Telephone Encounter (Signed)
*  STAT* If patient is at the pharmacy, call can be transferred to refill team.   1. Which medications need to be refilled? (please list name of each medication and dose if known)   rosuvastatin  (CRESTOR ) 5 MG tablet   2. Would you like to learn more about the convenience, safety, & potential cost savings by using the [facility]?   3. Are you open to using the [facility] (Type [facility]. ).  4. Which pharmacy/location (including street and city if local pharmacy) is medication to be sent to?  [id number] - [place], [place] - [address]   5. Do they need a 30 day or 90 day supply?   90 day  Patient stated she has been out of this medication for a week.

## 2023-02-25 ENCOUNTER — Ambulatory Visit: Payer: Medicare HMO | Admitting: Podiatry

## 2023-02-25 ENCOUNTER — Encounter: Payer: Self-pay | Admitting: Podiatry

## 2023-02-25 VITALS — Ht 61.0 in | Wt 274.0 lb

## 2023-02-25 DIAGNOSIS — B351 Tinea unguium: Secondary | ICD-10-CM | POA: Diagnosis not present

## 2023-02-25 DIAGNOSIS — M79609 Pain in unspecified limb: Secondary | ICD-10-CM

## 2023-02-25 DIAGNOSIS — E119 Type 2 diabetes mellitus without complications: Secondary | ICD-10-CM

## 2023-03-01 NOTE — Progress Notes (Signed)
ANNUAL DIABETIC FOOT EXAM  Subjective: Rebecca Rich presents today for annual diabetic foot exam.  Chief Complaint  Patient presents with   [facility]    "She is here for nail trim, Her last A1C was 6.9"PCP is Dr. Fabian Sharp, last seen in 11/24"   Patient confirms h/o diabetes.  Patient denies any h/o foot wounds.  Panosh, Neta Mends, MD is patient's PCP.  Past Medical History:  Diagnosis Date   Acute bronchospasm 08/24/2009   Acute lower GI bleeding 02/16/2017   Acute perforated appendicitis w abscess s/p lap appendectomy 12/30/2017 12/30/2017   Acute sphenoidal sinusitis 02/26/2010   Qualifier: Diagnosis of  By: Fabian Sharp MD, Neta Mends    CARDIAC MURMUR 12/08/2006   COLONIC POLYPS, HX OF 12/08/2006   DIABETES MELLITUS, TYPE II 12/08/2006   Gallbladder/common duct stone, without infection, with obstruction 03/01/2010   removed ercp   HYPERLIPIDEMIA 12/08/2006   HYPERTENSION 12/08/2006   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   INFECTION, 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   LIVER FUNCTION TESTS, ABNORMAL 07/28/2008   Morbid obesity (HCC) 12/08/2006   Nuclear sclerotic cataract of both eyes 03/26/2020   OBESITY 09/24/2009   OBSTRUCTIVE SLEEP APNEA 12/08/2006   OSTEOARTHRITIS 12/08/2006   RUQ PAIN 06/16/2008   SHOULDER PAIN, RIGHT 02/07/2008   Sleep apnea    Swelling of limb 07/28/2008   THYROID FUNCTION TEST, ABNORMAL 12/08/2006   Patient Active Problem List   Diagnosis Date Noted   Colon cancer screening 12/25/2021   Change in bowel habit 12/25/2021   Posterior vitreous detachment of right eye 03/26/2020   Posterior vitreous detachment of left eye 03/26/2020   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) 03/26/2020   Numbness and tingling of left leg 04/05/2018   Influenza vaccination declined by patient 04/05/2018   SVT (supraventricular tachycardia) (HCC) 12/31/2017   Diverticulosis of intestine with bleeding    Rectal bleeding    Hx of gout 08/21/2015   Mild  aortic stenosis 08/21/2015   Osteoarthritis of finger of left hand 06/01/2015   Pain 06/01/2015   Trigger middle finger of right hand 06/01/2015   Muscle spasm 03/22/2013   Skin lesion of back 09/13/2012   Left shoulder pain 01/20/2012   Hypothyroid 05/31/2011   Elevated uric acid in blood 05/31/2011   Palmar nodule 03/25/2011   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   SHOULDER PAIN, RIGHT 02/07/2008   Type 2 diabetes mellitus with obesity (HCC) 12/08/2006   HYPERLIPIDEMIA 12/08/2006   Morbid obesity (HCC) 12/08/2006   OSA on CPAP 12/08/2006   Essential hypertension 12/08/2006   Osteoarthritis 12/08/2006   THYROID FUNCTION TEST, ABNORMAL 12/08/2006   History of colonic polyps 12/08/2006   Past Surgical History:  Procedure Laterality Date   ABDOMINAL HYSTERECTOMY     CHOLECYSTECTOMY  06/27/2008   COLONOSCOPY N/A 12/13/2012   Procedure: COLONOSCOPY;  Surgeon: Charna Elizabeth, MD;  Location: [facility]OSCOPY;  Service: Endoscopy;  Laterality: N/A;   COLONOSCOPY Left 02/17/2017   Procedure: COLONOSCOPY;  Surgeon: Jeani Hawking, MD;  Location: [facility] ENDOSCOPY;  Service: Endoscopy;  Laterality: Left;   common duct stone     ERCP    EYE SURGERY     LAPAROSCOPIC APPENDECTOMY N/A 12/30/2017   Procedure: APPENDECTOMY LAPAROSCOPIC;  Surgeon: Almond Lint, MD;  Location: [facility]S;  Service: General;  Laterality: N/A;   Current Outpatient Medications on File Prior to Visit  Medication Sig Dispense Refill   amLODipine (NORVASC) 10 MG tablet TAKE 1  TABLET BY MOUTH EVERY DAY 90 tablet 3   chlorthalidone (HYGROTON) 25 MG tablet Take 1 tablet (25 mg total) by mouth daily. 90 tablet 1   cholecalciferol (VITAMIN D) 1000 UNITS tablet Take 1,000 Units by mouth daily.     colchicine 0.6 MG tablet Take initial dose of 2 tablets tonight. Days 2-13, take one tablet per day. 15 tablet 0   glucose blood (ACCU-CHEK AVIVA PLUS) test strip Use to check blood sugar daily E11.9 100 each 12   Lancets  (ACCU-CHEK SOFT TOUCH) lancets Use to test blood glucose twice daily 50 each 12   levothyroxine (SYNTHROID) 75 MCG tablet TAKE 1 TABLET BY MOUTH EVERY DAY BEFORE BREAKFAST 90 tablet 1   losartan (COZAAR) 100 MG tablet Take 1 tablet (100 mg total) by mouth daily. 90 tablet 1   metoprolol succinate (TOPROL-XL) 25 MG 24 hr tablet TAKE 1 TABLET BY MOUTH EVERY DAY 90 tablet 2   Misc Natural Products (OSTEO BI-FLEX ADV JOINT SHIELD PO) Take 1 tablet by mouth daily.     potassium chloride (KLOR-CON) 10 MEQ tablet TAKE 1 TABLET (10 MEQ TOTAL) BY MOUTH 3 (THREE) TIMES DAILY. 90 tablet 5   rosuvastatin (CRESTOR) 5 MG tablet Take 1 tablet (5 mg total) by mouth daily. 90 tablet 2   No current facility-administered medications on file prior to visit.    No Known Allergies Social History   Occupational History   Occupation: retired    Associate Professor: RETIRED  Tobacco Use   Smoking status: Never   Smokeless tobacco: Never  Vaping Use   Vaping status: Never Used  Substance and Sexual Activity   Alcohol use: No    Alcohol/week: 0.0 standard drinks of alcohol   Drug use: No   Sexual activity: Not on file   Family History  Problem Relation Age of Onset   Other Mother        blood clots   Cervical cancer Mother    Cancer Mother    Heart disease Brother    Diabetes Brother    Heart disease Sister    Diabetes Sister    Heart disease Brother    Diabetes Brother    Heart disease Brother    Diabetes Brother    Stroke Sister    Diabetes Sister    Throat cancer Father    Cancer Father    Diabetes Other        all siblings, 4 brothers, 5 sisters   Immunization History  Administered Date(s) Administered   PFIZER(Purple Top)SARS-COV-2 Vaccination 10/31/2020, 11/15/2020   Pneumococcal Conjugate-13 11/23/2015   Pneumococcal Polysaccharide-23 05/24/2012   Td 01/28/2003   Tdap 12/05/2019     Review of Systems: Negative except as noted in the HPI.   Objective: There were no vitals filed for this  visit.  Rebecca Rich is a pleasant 78 y.o. female in NAD. AAO X 3.  Title   Diabetic Foot Exam - detailed Date & Time: 02/25/2023  2:30 PM Diabetic Foot exam was performed with the following findings: Yes  Visual Foot Exam completed.: Yes  Is there a history of foot ulcer?: No Is there a foot ulcer now?: No Is there swelling?: Yes Is there elevated skin temperature?: No Is there abnormal foot shape?: No Is there a claw toe deformity?: No Are the toenails long?: Yes Are the toenails thick?: Yes Are the toenails ingrown?: No Is the skin thin, fragile, shiny and hairless?": No Normal Range of Motion?: Yes Is there foot  or ankle muscle weakness?: No Do you have pain in calf while walking?: No Are the shoes appropriate in style and fit?: Yes Can the patient see the bottom of their feet?: No Pulse Foot Exam completed.: Yes   Right Posterior Tibialis: Present Left posterior Tibialis: Present   Right Dorsalis Pedis: Present Left Dorsalis Pedis: Present     Sensory Foot Exam Completed.: Yes Semmes-Weinstein Monofilament Test "+" means "has sensation" and "-" means "no sensation"   R Site 1-Great Toe: Pos L Site 1-Great Toe: Pos   R Site 4: Pos L Site 4: Pos   R site 5: Pos L Site 5: Pos  R Site 6: Pos L Site 6: Pos     Image components are not supported.   Image components are not supported. Image components are not supported.  Tuning Fork Right vibratory: present Left vibratory: present  Comments     Lab Results  Component Value Date   HGBA1C 7.1 (A) 01/27/2023   ADA Risk Categorization: Low Risk :  Patient has all of the following: Intact protective sensation No prior foot ulcer  No severe deformity Pedal pulses present  Assessment: 1. Pain due to onychomycosis of nail   2. Diabetes mellitus without complication (HCC)   3. Encounter for diabetic foot exam [facility])     Plan: -Patient was evaluated today. All questions/concerns addressed on today's  visit. -Diabetic foot examination performed today. -Continue diabetic foot care principles: inspect feet daily, monitor glucose as recommended by PCP and/or Endocrinologist, and follow prescribed diet per PCP, Endocrinologist and/or dietician. -Toenails 1-5 b/l were debrided in length and girth with sterile nail nippers and dremel without iatrogenic bleeding.  -Patient/POA to call should there be question/concern in the interim. Return in about 3 months (around 05/26/2023).  Freddie Breech, DPM      [place] LOCATION: [address] [place] [address]                   Office [phone]   [facility] LOCATION: [address] [place], [place] [address] Office [phone]

## 2023-03-10 ENCOUNTER — Encounter: Payer: Self-pay | Admitting: Family Medicine

## 2023-03-10 ENCOUNTER — Ambulatory Visit (INDEPENDENT_AMBULATORY_CARE_PROVIDER_SITE_OTHER): Payer: Medicare HMO | Admitting: Family Medicine

## 2023-03-10 VITALS — BP 124/70 | HR 67 | Resp 16 | Ht 61.0 in | Wt 275.0 lb

## 2023-03-10 DIAGNOSIS — R0789 Other chest pain: Secondary | ICD-10-CM | POA: Diagnosis not present

## 2023-03-10 DIAGNOSIS — M7918 Myalgia, other site: Secondary | ICD-10-CM

## 2023-03-10 MED ORDER — VALACYCLOVIR HCL 1 G PO TABS: 1000.0000 mg | ORAL_TABLET | Freq: Three times a day (TID) | ORAL | 0 refills | Status: AC

## 2023-03-10 NOTE — Patient Instructions (Addendum)
A few things to remember from today's visit:  Right-sided chest pain  Musculoskeletal pain  Pain seems to be muscle related. Please sign a release form , so we can see X rays done in the ED. Monitor for rash, if any bump or blister on affected area start Valtrex for possible shingles.Do not fill/ take med if no rash. Topical Icy hot with Lidocaine on affected area.  Do not use My Chart to request refills or for acute issues that need immediate attention. If you send a my chart message, it may take a few days to be addressed, specially if I am not in the office.  Please be sure medication list is accurate. If a new problem present, please set up appointment sooner than planned today.

## 2023-03-10 NOTE — Progress Notes (Signed)
ACUTE VISIT Chief Complaint  Patient presents with   Pain    Above the rib pain ongoing x 2 weeks on the right side    HPI: Ms.Rebecca Rich is a 78 y.o. female with a PMHx significant for SVT, HTN, mild aortic stenosis, OSA on CPAP, DM II, hypothyroidism, OA, HLD, and gout, among some, who is here today with above complaint.  Right-sided chest wall pain for about 2 weeks, it extends from right parasternal area to under right breast and lateral torso.   She describes the pain as an achy pain with occasional bursts of sharp needle-like pains.  Currently, she rates it as a 3/10, but says it was an 8/10 yesterday. It was constant at its onset but is now intermittent.   Pain is worsened by breathing, but not significantly worsened by arm movement or palpation.  Also mentions she has had some nipple pruritus, has not noted rash, masses,or nipple discharge. Her last mammogram was in 05/2022.  She has not been taking anything for the pain.   Reports that she was evaluated in the ED on 2/5 for this problem and had x-rays of her chest and abdomen, which she says came back negative. Pertinent negatives include fever, chills, sore throat, cough, wheezing, SOB, or recent injury or illness.   Review of Systems  Constitutional:  Negative for activity change, appetite change and unexpected weight change.  HENT:  Negative for mouth sores and trouble swallowing.   Gastrointestinal:  Negative for abdominal pain, nausea and vomiting.  Genitourinary:  Negative for decreased urine volume, dysuria and hematuria.  Neurological:  Negative for syncope and weakness.  See other pertinent positives and negatives in HPI.  Current Outpatient Medications on File Prior to Visit  Medication Sig Dispense Refill   amLODipine (NORVASC) 10 MG tablet TAKE 1 TABLET BY MOUTH EVERY DAY 90 tablet 3   chlorthalidone (HYGROTON) 25 MG tablet Take 1 tablet (25 mg total) by mouth daily. 90 tablet 1   cholecalciferol (VITAMIN  D) 1000 UNITS tablet Take 1,000 Units by mouth daily.     colchicine 0.6 MG tablet Take initial dose of 2 tablets tonight. Days 2-13, take one tablet per day. 15 tablet 0   glucose blood (ACCU-CHEK AVIVA PLUS) test strip Use to check blood sugar daily E11.9 100 each 12   Lancets (ACCU-CHEK SOFT TOUCH) lancets Use to test blood glucose twice daily 50 each 12   levothyroxine (SYNTHROID) 75 MCG tablet TAKE 1 TABLET BY MOUTH EVERY DAY BEFORE BREAKFAST 90 tablet 1   losartan (COZAAR) 100 MG tablet Take 1 tablet (100 mg total) by mouth daily. 90 tablet 1   metoprolol succinate (TOPROL-XL) 25 MG 24 hr tablet TAKE 1 TABLET BY MOUTH EVERY DAY 90 tablet 2   Misc Natural Products (OSTEO BI-FLEX ADV JOINT SHIELD PO) Take 1 tablet by mouth daily.     potassium chloride (KLOR-CON) 10 MEQ tablet TAKE 1 TABLET (10 MEQ TOTAL) BY MOUTH 3 (THREE) TIMES DAILY. 90 tablet 5   rosuvastatin (CRESTOR) 5 MG tablet Take 1 tablet (5 mg total) by mouth daily. 90 tablet 2   No current facility-administered medications on file prior to visit.   Past Medical History:  Diagnosis Date   Acute bronchospasm 08/24/2009   Acute lower GI bleeding 02/16/2017   Acute perforated appendicitis w abscess s/p lap appendectomy 12/30/2017 12/30/2017   Acute sphenoidal sinusitis 02/26/2010   Qualifier: Diagnosis of  By: Fabian Sharp MD, Neta Mends    CARDIAC  MURMUR 12/08/2006   COLONIC POLYPS, HX OF 12/08/2006   DIABETES MELLITUS, TYPE II 12/08/2006   Gallbladder/common duct stone, without infection, with obstruction 03/01/2010   removed ercp   HYPERLIPIDEMIA 12/08/2006   HYPERTENSION 12/08/2006   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   INFECTION, 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   LIVER FUNCTION TESTS, ABNORMAL 07/28/2008   Morbid obesity (HCC) 12/08/2006   Nuclear sclerotic cataract of both eyes 03/26/2020   OBESITY 09/24/2009   OBSTRUCTIVE SLEEP APNEA 12/08/2006   OSTEOARTHRITIS 12/08/2006   RUQ PAIN 06/16/2008   SHOULDER PAIN, RIGHT  02/07/2008   Sleep apnea    Swelling of limb 07/28/2008   THYROID FUNCTION TEST, ABNORMAL 12/08/2006   No Known Allergies  Social History   Socioeconomic History   Marital status: Divorced    Spouse name: Not on file   Number of children: Not on file   Years of education: Not on file   Highest education level: Bachelor's degree (e.g., BA, AB, BS)  Occupational History   Occupation: retired    Associate Professor: RETIRED  Tobacco Use   Smoking status: Never   Smokeless tobacco: Never  Vaping Use   Vaping status: Never Used  Substance and Sexual Activity   Alcohol use: No    Alcohol/week: 0.0 standard drinks of alcohol   Drug use: No   Sexual activity: Not on file  Other Topics Concern   Not on file  Social History Narrative   Master level education in math   Pt is currently retired   Pt is divorced with children   Recently had to move had a break in and thus away from her pool exercise   Social Drivers of Health   Financial Resource Strain: Low Risk  (01/26/2023)   Overall Financial Resource Strain (CARDIA)    Difficulty of Paying Living Expenses: Not hard at all  Food Insecurity: No Food Insecurity (01/26/2023)   Hunger Vital Sign    Worried About Running Out of Food in the Last Year: Never true    Ran Out of Food in the Last Year: Never true  Transportation Needs: No Transportation Needs (01/26/2023)   PRAPARE - Administrator, Civil Service (Medical): No    Lack of Transportation (Non-Medical): No  Physical Activity: Insufficiently Active (01/26/2023)   Exercise Vital Sign    Days of Exercise per Week: 2 days    Minutes of Exercise per Session: 20 min  Stress: Stress Concern Present (01/26/2023)   [employer] of Occupational Health - Occupational Stress Questionnaire    Feeling of Stress : To some extent  Social Connections: Moderately Integrated (01/26/2023)   Social Connection and Isolation Panel [NHANES]    Frequency of Communication with Friends and  Family: More than three times a week    Frequency of Social Gatherings with Friends and Family: Three times a week    Attends Religious Services: More than 4 times per year    Active Member of Clubs or Organizations: Yes    Attends Banker Meetings: More than 4 times per year    Marital Status: Divorced    Vitals:   03/10/23 0951  BP: 124/70  Pulse: 67  Resp: 16  SpO2: 97%   Body mass index is 51.96 kg/m.  Physical Exam Vitals and nursing note reviewed. Exam conducted with a chaperone present.  Constitutional:      General: She is not in acute distress.    Appearance: She is well-developed.  HENT:     Head: Normocephalic and atraumatic.     Mouth/Throat:     Mouth: Mucous membranes are moist.     Pharynx: Oropharynx is clear.  Eyes:     Conjunctiva/sclera: Conjunctivae normal.  Cardiovascular:     Rate and Rhythm: Normal rate and regular rhythm.     Pulses:          Dorsalis pedis pulses are 2+ on the right side and 2+ on the left side.     Heart sounds: Murmur (SEM I/VI RUSB) heard.  Pulmonary:     Effort: Pulmonary effort is normal. No respiratory distress.     Breath sounds: Normal breath sounds.  Chest:     Chest wall: Tenderness present. No crepitus or edema.    Abdominal:     Palpations: Abdomen is soft. There is no hepatomegaly or mass.     Tenderness: There is no abdominal tenderness.  Lymphadenopathy:     Cervical: No cervical adenopathy.     Upper Body:     Right upper body: No supraclavicular or axillary adenopathy.  Skin:    General: Skin is warm.     Findings: No erythema or rash.  Neurological:     General: No focal deficit present.     Mental Status: She is alert and oriented to person, place, and time.     Cranial Nerves: No cranial nerve deficit.  Psychiatric:        Mood and Affect: Mood and affect normal.    ASSESSMENT AND PLAN:  Ms. Palka was seen today for chest pain.   Right-sided chest wall pain  Musculoskeletal  pain  Other orders -     valACYclovir HCl; Take 1 tablet (1,000 mg total) by mouth 3 (three) times daily for 7 days.  Dispense: 21 tablet; Refill: 0  We discussed possible etiologies. Hx and examination do not suggest a serious process. She reports having CXR and abdominal imaging during recent ED visit, so I do not think imaging is needed at this time. We will try to obtain records and copy of imaging reports. Seems to be musculoskeletal, recommend topical icy hot with lidocaine on affected area.  Needle like sensation intermittently, could also be prodromic symptoms of possible zoster infection. Recommend monitoring for rash and if any on affected area, instructed to start Valtrex.  Instructed about warning signs. F/U with PCP in 2 weeks of pain has not resolved, before iof it gets worse.  I spent a total of 31 minutes in both face to face and non face to face activities for this visit on the date of this encounter. During this time history was obtained and documented, examination was performed, and assessment/plan discussed.  Return in about 2 weeks (around 03/24/2023), or if symptoms worsen or fail to improve, for for costal pain with PCP.  I, Rolla Etienne Wierda, acting as a scribe for Adell Koval [place], MD., have documented all relevant documentation on the behalf of Shelanda Duvall [place], MD, as directed by  Tiahna Cure [place], MD while in the presence of Dezi Schaner [place], MD.   I, Kahmari Koller [place], MD, have reviewed all documentation for this visit. The documentation on 03/10/23 for the exam, diagnosis, procedures, and orders are all accurate and complete.  Brandan Robicheaux G. [place], MD  [facility]. [place] [facility].

## 2023-03-24 ENCOUNTER — Telehealth: Payer: Self-pay

## 2023-03-24 ENCOUNTER — Other Ambulatory Visit: Payer: Self-pay | Admitting: Podiatry

## 2023-03-24 DIAGNOSIS — M109 Gout, unspecified: Secondary | ICD-10-CM

## 2023-03-24 DIAGNOSIS — M10071 Idiopathic gout, right ankle and foot: Secondary | ICD-10-CM

## 2023-03-24 DIAGNOSIS — R609 Edema, unspecified: Secondary | ICD-10-CM

## 2023-03-24 MED ORDER — COLCHICINE 0.6 MG PO TABS: ORAL_TABLET | ORAL | 0 refills | Status: AC

## 2023-03-24 NOTE — Telephone Encounter (Signed)
 Patient called and left a message. She is having a Gout flare up that started 5 days ago. She is asking if you can refill her colchicine, or would you need to see her again? Last seen for routine foot care on 02/25/23 -thanks

## 2023-04-03 ENCOUNTER — Ambulatory Visit: Payer: Medicare HMO

## 2023-04-03 VITALS — Ht 60.0 in | Wt 263.0 lb

## 2023-04-03 DIAGNOSIS — Z0000 Encounter for general adult medical examination without abnormal findings: Secondary | ICD-10-CM | POA: Diagnosis not present

## 2023-04-03 NOTE — Progress Notes (Signed)
 Subjective:   Rebecca Rich is a 78 y.o. female who presents for Medicare Annual (Subsequent) preventive examination.  Visit Complete: Virtual I connected with  Rebecca Rich on 04/03/23 by a audio enabled telemedicine application and verified that I am speaking with the correct person using two identifiers.  Patient Location: Home  Provider Location: Home Office  I discussed the limitations of evaluation and management by telemedicine. The patient expressed understanding and agreed to proceed.  Vital Signs: Because this visit was a virtual/telehealth visit, some criteria may be missing or patient reported. Any vitals not documented were not able to be obtained and vitals that have been documented are patient reported.  Patient Medicare AWV questionnaire was completed by the patient on 03/29/23; I have confirmed that all information answered by patient is correct and no changes since this date.  Cardiac Risk Factors include: advanced age (>12men, >39 women);diabetes mellitus;hypertension     Objective:    Today's Vitals   04/03/23 1506  Weight: 263 lb (119.3 kg)  Height: 5' (1.524 m)   Body mass index is 51.36 kg/m.     04/03/2023    3:15 PM 03/24/2022   11:42 AM 03/21/2021   11:21 AM 03/07/2020    2:05 PM 06/29/2018    8:17 PM 12/30/2017   12:47 PM 12/30/2017   10:18 AM  Advanced Directives  Does Patient Have a Medical Advance Directive? Yes Yes Yes No Yes No No  Type of Estate agent of [place];Living will Healthcare Power of [place];Living will Healthcare Power of [place];Living will  Healthcare Power of Attorney    Does patient want to make changes to medical advance directive?   No - Patient declined  No - Patient declined    Copy of Healthcare Power of Attorney in Chart? No - copy requested No - copy requested No - copy requested  No - copy requested    Would patient like information on creating a medical advance directive?    Yes  (MAU/Ambulatory/Procedural Areas - Information given) No - Patient declined No - Patient declined     Current Medications (verified) Outpatient Encounter Medications as of 04/03/2023  Medication Sig   amLODipine (NORVASC) 10 MG tablet TAKE 1 TABLET BY MOUTH EVERY DAY   chlorthalidone (HYGROTON) 25 MG tablet Take 1 tablet (25 mg total) by mouth daily.   cholecalciferol (VITAMIN D) 1000 UNITS tablet Take 1,000 Units by mouth daily.   colchicine 0.6 MG tablet Take initial dose of 2 tablets tonight. Days 2-13, take one tablet per day.   glucose blood (ACCU-CHEK AVIVA PLUS) test strip Use to check blood sugar daily E11.9   Lancets (ACCU-CHEK SOFT TOUCH) lancets Use to test blood glucose twice daily   levothyroxine (SYNTHROID) 75 MCG tablet TAKE 1 TABLET BY MOUTH EVERY DAY BEFORE BREAKFAST   losartan (COZAAR) 100 MG tablet Take 1 tablet (100 mg total) by mouth daily.   metoprolol succinate (TOPROL-XL) 25 MG 24 hr tablet TAKE 1 TABLET BY MOUTH EVERY DAY   Misc Natural Products (OSTEO BI-FLEX ADV JOINT SHIELD PO) Take 1 tablet by mouth daily.   potassium chloride (KLOR-CON) 10 MEQ tablet TAKE 1 TABLET (10 MEQ TOTAL) BY MOUTH 3 (THREE) TIMES DAILY.   rosuvastatin (CRESTOR) 5 MG tablet Take 1 tablet (5 mg total) by mouth daily.   No facility-administered encounter medications on file as of 04/03/2023.    Allergies (verified) Patient has no known allergies.   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   INFECTION, 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   LIVER FUNCTION TESTS, ABNORMAL 07/28/2008   Morbid obesity (HCC) 12/08/2006   Nuclear sclerotic cataract of both eyes 03/26/2020   OBESITY 09/24/2009   OBSTRUCTIVE SLEEP APNEA 12/08/2006   OSTEOARTHRITIS 12/08/2006   RUQ PAIN 06/16/2008   SHOULDER PAIN, RIGHT 02/07/2008   Sleep apnea    Swelling of limb 07/28/2008   THYROID FUNCTION TEST, ABNORMAL 12/08/2006   Past Surgical History:  Procedure Laterality Date   ABDOMINAL HYSTERECTOMY     CHOLECYSTECTOMY  06/27/2008   COLONOSCOPY N/A 12/13/2012   Procedure: COLONOSCOPY;  Surgeon: Charna Elizabeth, MD;  Location: [facility] ENDOSCOPY;  Service: Endoscopy;  Laterality: N/A;   COLONOSCOPY Left 02/17/2017   Procedure: COLONOSCOPY;  Surgeon: Jeani Hawking, MD;  Location: [facility] ENDOSCOPY;  Service: Endoscopy;  Laterality: Left;   common duct stone     ERCP    EYE SURGERY     LAPAROSCOPIC APPENDECTOMY N/A 12/30/2017   Procedure: APPENDECTOMY LAPAROSCOPIC;  Surgeon: Almond Lint, MD;  Location: [facility] ORS;  Service: General;  Laterality: N/A;   Family History  Problem Relation Age of Onset   Other Mother        blood clots   Cervical cancer Mother    Cancer Mother    Heart disease Brother    Diabetes Brother    Heart disease Sister    Diabetes Sister    Heart disease Brother    Diabetes Brother    Heart disease Brother    Diabetes Brother    Stroke Sister    Diabetes Sister    Throat cancer Father    Cancer Father    Diabetes Other        all siblings, 4 brothers, 5 sisters   Social History   Socioeconomic History   Marital status: Divorced    Spouse name: Not on file   Number of children: Not on file   Years of education: Not on file   Highest education level: Bachelor's degree (e.g., BA, AB, BS)  Occupational History   Occupation: retired    Associate Professor: RETIRED  Tobacco Use   Smoking status: Never   Smokeless tobacco: Never  Vaping Use    Vaping status: Never Used  Substance and Sexual Activity   Alcohol use: No    Alcohol/week: 0.0 standard drinks of alcohol   Drug use: No   Sexual activity: Not on file  Other Topics Concern   Not on file  Social History Narrative   Master level education in math   Pt is currently retired   Pt is divorced with children   Recently had to move had a break in and thus away from her pool exercise   Social Drivers of Health   Financial Resource Strain: Low Risk  (04/03/2023)   Overall Financial Resource Strain (CARDIA)    Difficulty of Paying Living Expenses: Not hard at all  Food Insecurity: No Food Insecurity (04/03/2023)   Hunger Vital  Sign    Worried About Programme researcher, broadcasting/film/video in the Last Year: Never true    Ran Out of Food in the Last Year: Never true  Transportation Needs: No Transportation Needs (04/03/2023)   PRAPARE - Administrator, Civil Service (Medical): No    Lack of Transportation (Non-Medical): No  Physical Activity: Insufficiently Active (04/03/2023)   Exercise Vital Sign    Days of Exercise per Week: 2 days    Minutes of Exercise per Session: 30 min  Stress: No Stress Concern Present (04/03/2023)   [employer] of Occupational Health - Occupational Stress Questionnaire    Feeling of Stress : Not at all  Recent Concern: Stress - Stress Concern Present (01/26/2023)   [employer] of Occupational Health - Occupational Stress Questionnaire    Feeling of Stress : To some extent  Social Connections: Moderately Integrated (04/03/2023)   Social Connection and Isolation Panel [NHANES]    Frequency of Communication with Friends and Family: More than three times a week    Frequency of Social Gatherings with Friends and Family: More than three times a week    Attends Religious Services: More than 4 times per year    Active Member of [employer] or Organizations: Yes    Attends Engineer, structural: More than 4 times per year    Marital Status: Divorced     Tobacco Counseling Counseling given: Not Answered   Clinical Intake:  Pre-visit preparation completed: Yes  Pain : No/denies pain     BMI - recorded: 51.36 Nutritional Status: BMI > 30  Obese Nutritional Risks: None Diabetes: Yes CBG done?: Yes (CBG 124 Per patient) CBG resulted in Enter/ Edit results?: Yes Did pt. bring in CBG monitor from home?: No  How often do you need to have someone help you when you read instructions, pamphlets, or other written materials from your doctor or pharmacy?: 1 - Never  Interpreter Needed?: No  Information entered by :: Rebecca Mulligan LPN   Activities of Daily Living    04/03/2023    3:14 PM 03/29/2023    5:50 PM  In your present state of health, do you have any difficulty performing the following activities:  Hearing? 0 0  Vision? 0 0  Difficulty concentrating or making decisions? 0 0  Walking or climbing stairs? 1 1  Dressing or bathing? 0 0  Doing errands, shopping? 0 0  Preparing Food and eating ? N N  Using the Toilet? N N  In the past six months, have you accidently leaked urine? Malvin Johns  Comment Wears Pads. Followed by PCP   Do you have problems with loss of bowel control? N N  Managing your Medications? N N  Managing your Finances? N N  Housekeeping or managing your Housekeeping? N N    Patient Care Team: Panosh, Neta Mends, MD as PCP - General Pricilla Riffle, MD as PCP - Cardiology (Cardiology) Ovidio Kin, MD (General Surgery) Jeani Hawking, MD (Gastroenterology) Carrington Clamp, DPM (Inactive) (Podiatry) Yates Decamp, MD as Consulting Physician (Cardiology) Charna Elizabeth, MD as Consulting Physician (Gastroenterology) Luciana Axe Alford Highland, MD as Consulting Physician (Ophthalmology)  Indicate any recent Medical Services you may have received from other than [facility] providers in the past year (date may be approximate).     Assessment:   This is a routine wellness examination for Rebecca Rich.  Hearing/Vision screen Hearing  Screening - Comments:: Denies hearing difficulties   Vision Screening - Comments:: Wears rx glasses -  up to date with routine eye exams with  Dr Luciana Axe   Goals Addressed               This Visit's Progress     Increase physical activity (pt-stated)        Continue to lose weight.       Depression Screen    04/03/2023    3:13 PM 09/02/2022   10:13 AM 04/01/2022    9:44 AM 03/24/2022   11:40 AM 06/10/2021   11:30 AM 03/21/2021   11:16 AM 03/07/2020    2:03 PM  PHQ 2/9 Scores  PHQ - 2 Score 0 0 0 0 0 0 0  PHQ- 9 Score  3 3  1       Fall Risk    04/03/2023    3:14 PM 03/29/2023    5:50 PM 01/26/2023   11:59 PM 09/02/2022   10:13 AM 04/01/2022    9:44 AM  Fall Risk   Falls in the past year? 0 0 0 0 0  Number falls in past yr: 0   0 0  Injury with Fall? 0   0 0  Risk for fall due to : No Fall Risks   No Fall Risks No Fall Risks  Follow up Falls prevention discussed;Falls evaluation completed   Falls evaluation completed Falls evaluation completed    MEDICARE RISK AT HOME: Medicare Risk at Home Any stairs in or around the home?: No If so, are there any without handrails?: No Home free of loose throw rugs in walkways, pet beds, electrical cords, etc?: No Adequate lighting in your home to reduce risk of falls?: Yes Life alert?: Yes Use of a cane, walker or w/c?: Yes Grab bars in the bathroom?: No Shower chair or bench in shower?: Yes Elevated toilet seat or a handicapped toilet?: No  TIMED UP AND GO:  Was the test performed?  No    Cognitive Function:        04/03/2023    3:15 PM 03/24/2022   11:42 AM 03/21/2021   11:21 AM 03/07/2020    2:20 PM  6CIT Screen  What Year? 0 points 0 points 0 points 0 points  What month? 0 points 0 points 0 points 0 points  What time? 0 points 0 points 0 points   Count back from 20 0 points 0 points 0 points 0 points  Months in reverse 0 points 0 points 0 points 0 points  Repeat phrase 0 points 2 points 0 points 0 points  Total Score 0 points  2 points 0 points     Immunizations Immunization History  Administered Date(s) Administered   PFIZER(Purple Top)SARS-COV-2 Vaccination 10/31/2020, 11/15/2020   Pneumococcal Conjugate-13 11/23/2015   Pneumococcal Polysaccharide-23 05/24/2012   Td 01/28/2003   Tdap 12/05/2019    TDAP status: Up to date    Pneumococcal vaccine status: Up to date    Qualifies for Shingles Vaccine? Yes   Zostavax completed No   Shingrix Completed?: No.    Education has been provided regarding the importance of this vaccine. Patient has been advised to call insurance company to determine out of pocket expense if they have not yet received this vaccine. Advised may also receive vaccine at local pharmacy or Health Dept. Verbalized acceptance and understanding.   Screening Tests Health Maintenance  Topic Date Due   Zoster Vaccines- Shingrix (1 of 2) Never done   Diabetic kidney evaluation - Urine ACR  04/01/2023   HEMOGLOBIN A1C  07/27/2023  Diabetic kidney evaluation - eGFR measurement  09/02/2023   OPHTHALMOLOGY EXAM  09/24/2023   FOOT EXAM  02/25/2024   Medicare Annual Wellness (AWV)  04/02/2024   DTaP/Tdap/Td (3 - Td or Tdap) 12/04/2029   Pneumonia Vaccine 14+ Years old  Completed   DEXA SCAN  Completed   Hepatitis C Screening  Completed   HPV VACCINES  Aged Out   INFLUENZA VACCINE  Discontinued   Colonoscopy  Discontinued   COVID-19 Vaccine  Discontinued    Health Maintenance  Health Maintenance Due  Topic Date Due   Zoster Vaccines- Shingrix (1 of 2) Never done   Diabetic kidney evaluation - Urine ACR  04/01/2023        Bone Density status: Completed 09/30/22. Results reflect: Bone density results: NORMAL. Repeat every   years.     Additional Screening:  Hepatitis C Screening: does qualify; Completed 02/26/10  Vision Screening: Recommended annual ophthalmology exams for early detection of glaucoma and other disorders of the eye. Is the patient up to date with their  annual eye exam?  Yes  Who is the provider or what is the name of the office in which the patient attends annual eye exams? Dr Luciana Axe If pt is not established with a provider, would they like to be referred to a provider to establish care? No .   Dental Screening: Recommended annual dental exams for proper oral hygiene  Diabetic Foot Exam: Diabetic Foot Exam: Completed 02/25/23  Community Resource Referral / Chronic Care Management:  CRR required this visit?  No   CCM required this visit?  No     Plan:     I have personally reviewed and noted the following in the patient's chart:   Medical and social history Use of alcohol, tobacco or illicit drugs  Current medications and supplements including opioid prescriptions. Patient is not currently taking opioid prescriptions. Functional ability and status Nutritional status Physical activity Advanced directives List of other physicians Hospitalizations, surgeries, and ER visits in previous 12 months Vitals Screenings to include cognitive, depression, and falls Referrals and appointments  In addition, I have reviewed and discussed with patient certain preventive protocols, quality metrics, and best practice recommendations. A written personalized care plan for preventive services as well as general preventive health recommendations were provided to patient.     Tillie Rung, LPN   02/01/1094   After Visit Summary: (MyChart) Due to this being a telephonic visit, the after visit summary with patients personalized plan was offered to patient via MyChart   Nurse Notes: None

## 2023-04-03 NOTE — Patient Instructions (Addendum)
 Rebecca Rich , Thank you for taking time to come for your Medicare Wellness Visit. I appreciate your ongoing commitment to your health goals. Please review the following plan we discussed and let me know if I can assist you in the future.   Referrals/Orders/Follow-Ups/Clinician Recommendations:   This is a list of the screening recommended for you and due dates:  Health Maintenance  Topic Date Due   Zoster (Shingles) Vaccine (1 of 2) Never done   Yearly kidney health urinalysis for diabetes  04/01/2023   Hemoglobin A1C  07/27/2023   Yearly kidney function blood test for diabetes  09/02/2023   Eye exam for diabetics  09/24/2023   Complete foot exam   02/25/2024   Medicare Annual Wellness Visit  04/02/2024   DTaP/Tdap/Td vaccine (3 - Td or Tdap) 12/04/2029   Pneumonia Vaccine  Completed   DEXA scan (bone density measurement)  Completed   Hepatitis C Screening  Completed   HPV Vaccine  Aged Out   Flu Shot  Discontinued   Colon Cancer Screening  Discontinued   COVID-19 Vaccine  Discontinued    Advanced directives: (Copy Requested) Please bring a copy of your health care power of attorney and living will to the office to be added to your chart at your convenience. You can mail to [facility] [address] [place], [place] [address] or email to [email or web address]  Next Medicare Annual Wellness Visit scheduled for next year: Yes

## 2023-04-08 ENCOUNTER — Other Ambulatory Visit: Payer: Self-pay | Admitting: Internal Medicine

## 2023-05-22 ENCOUNTER — Ambulatory Visit (INDEPENDENT_AMBULATORY_CARE_PROVIDER_SITE_OTHER): Payer: Medicare HMO | Admitting: Podiatry

## 2023-05-22 DIAGNOSIS — Z91199 Patient's noncompliance with other medical treatment and regimen due to unspecified reason: Secondary | ICD-10-CM

## 2023-05-24 NOTE — Progress Notes (Signed)
 1. No-show for appointment   No show #1.

## 2023-05-25 NOTE — Progress Notes (Unsigned)
 No chief complaint on file.   HPI: Rebecca Rich 78 y.o. come in for Chronic disease management  DM Weight HT RESP OA ROS: See pertinent positives and negatives per HPI.  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   INFECTION, 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   LIVER FUNCTION TESTS, ABNORMAL 07/28/2008   Morbid obesity (HCC) 12/08/2006   Nuclear sclerotic cataract of both eyes 03/26/2020   OBESITY 09/24/2009   OBSTRUCTIVE SLEEP APNEA 12/08/2006   OSTEOARTHRITIS 12/08/2006   RUQ PAIN 06/16/2008   SHOULDER PAIN, RIGHT 02/07/2008   Sleep apnea    Swelling of limb 07/28/2008   THYROID  FUNCTION TEST, ABNORMAL 12/08/2006    Family History  Problem Relation Age of Onset   Other Mother        blood clots   Cervical cancer Mother    Cancer Mother    Heart disease Brother    Diabetes Brother    Heart disease Sister    Diabetes Sister    Heart disease Brother    Diabetes Brother    Heart disease Brother    Diabetes Brother    Stroke Sister    Diabetes Sister    Throat cancer Father    Cancer Father    Diabetes Other        all siblings, 4 brothers, 5 sisters    Social History   Socioeconomic History   Marital status: Divorced    Spouse name: Not on file   Number of children: Not on file   Years of education: Not on file   Highest education level: Bachelor's degree (e.g., BA, AB, BS)  Occupational History   Occupation: retired     Associate Professor: RETIRED  Tobacco Use   Smoking status: Never   Smokeless tobacco: Never  Vaping Use   Vaping status: Never Used  Substance and Sexual Activity   Alcohol use: No    Alcohol/week: 0.0 standard drinks of alcohol   Drug use: No   Sexual activity: Not on file  Other Topics Concern   Not on file  Social History Narrative   Master level education in math   Pt is currently retired   Pt is divorced with children   Recently had to move had a break in and thus away from her pool exercise   Social Drivers of Health   Financial Resource Strain: Low Risk  (04/03/2023)   Overall Financial Resource Strain (CARDIA)    Difficulty of Paying Living Expenses: Not hard at all  Food Insecurity: No Food Insecurity (04/03/2023)   Hunger Vital Sign    Worried About Running Out of Food in the Last Year: Never true    Ran Out of Food in the Last Year: Never true  Transportation Needs: No Transportation Needs (04/03/2023)   PRAPARE - Administrator, Civil Service (Medical): No    Lack of Transportation (Non-Medical): No  Physical Activity: Insufficiently Active (04/03/2023)   Exercise Vital  Sign    Days of Exercise per Week: 2 days    Minutes of Exercise per Session: 30 min  Stress: No Stress Concern Present (04/03/2023)   [employer] of Occupational Health - Occupational Stress Questionnaire    Feeling of Stress : Not at all  Recent Concern: Stress - Stress Concern Present (01/26/2023)   [employer] of Occupational Health - Occupational Stress Questionnaire    Feeling of Stress : To some extent  Social Connections: Moderately Integrated (04/03/2023)   Social Connection and Isolation Panel [NHANES]    Frequency of Communication with Friends and Family: More than three times a week    Frequency of Social Gatherings with Friends and Family: More than three times a week    Attends Religious Services: More than 4 times per year    Active Member of [employer] or Organizations: Yes     Attends Engineer, structural: More than 4 times per year    Marital Status: Divorced    Outpatient Medications Prior to Visit  Medication Sig Dispense Refill   amLODipine  (NORVASC ) 10 MG tablet TAKE 1 TABLET BY MOUTH EVERY DAY 90 tablet 3   chlorthalidone  (HYGROTON ) 25 MG tablet Take 1 tablet (25 mg total) by mouth daily. 90 tablet 1   cholecalciferol (VITAMIN D) 1000 UNITS tablet Take 1,000 Units by mouth daily.     colchicine  0.6 MG tablet Take initial dose of 2 tablets tonight. Days 2-13, take one tablet per day. 15 tablet 0   glucose blood (ACCU-CHEK AVIVA PLUS) test strip Use to check blood sugar daily E11.9 100 each 12   Lancets (ACCU-CHEK SOFT TOUCH) lancets Use to test blood glucose twice daily 50 each 12   levothyroxine  (SYNTHROID ) 75 MCG tablet TAKE 1 TABLET BY MOUTH EVERY DAY BEFORE BREAKFAST 90 tablet 1   losartan  (COZAAR ) 100 MG tablet Take 1 tablet (100 mg total) by mouth daily. 90 tablet 1   metoprolol  succinate (TOPROL -XL) 25 MG 24 hr tablet TAKE 1 TABLET BY MOUTH EVERY DAY 90 tablet 2   Misc Natural Products (OSTEO BI-FLEX ADV JOINT SHIELD PO) Take 1 tablet by mouth daily.     potassium chloride  (KLOR-CON ) 10 MEQ tablet TAKE 1 TABLET (10 MEQ TOTAL) BY MOUTH 3 (THREE) TIMES DAILY. 90 tablet 5   rosuvastatin  (CRESTOR ) 5 MG tablet Take 1 tablet (5 mg total) by mouth daily. 90 tablet 2   No facility-administered medications prior to visit.     EXAM:  There were no vitals taken for this visit.  There is no height or weight on file to calculate BMI.  GENERAL: vitals reviewed and listed above, alert, oriented, appears well hydrated and in no acute distress HEENT: atraumatic, conjunctiva  clear, no obvious abnormalities on inspection of external nose and ears OP : no lesion edema or exudate  NECK: no obvious masses on inspection palpation  LUNGS: clear to auscultation bilaterally, no wheezes, rales or rhonchi, good air movement CV: HRRR, no clubbing cyanosis or   peripheral edema nl cap refill  MS: moves all extremities without noticeable focal  abnormality PSYCH: pleasant and cooperative, no obvious depression or anxiety Lab Results  Component Value Date   WBC 5.3 09/02/2022   HGB 12.1 09/02/2022   HCT 37.5 09/02/2022   PLT 165.0 09/02/2022   GLUCOSE 118 (H) 09/02/2022   CHOL 146 04/01/2022   TRIG 109.0 04/01/2022   HDL 55.70 04/01/2022   LDLCALC 69 04/01/2022   ALT 16 12/09/2022   AST 16  12/09/2022   NA 140 09/02/2022   K 3.7 09/02/2022   CL 107 09/02/2022   CREATININE 1.24 (H) 09/02/2022   BUN 29 (H) 09/02/2022   CO2 22 09/02/2022   TSH 4.32 09/02/2022   HGBA1C 7.1 (A) 01/27/2023   MICROALBUR 1.3 04/01/2022   BP Readings from Last 3 Encounters:  03/10/23 124/70  01/27/23 (!) 128/50  12/17/22 (!) 112/48    ASSESSMENT AND PLAN:  Discussed the following assessment and plan:  Type 2 diabetes mellitus with obesity (HCC)  OSA on CPAP  Morbid obesity (HCC)  Hx of gout Ab  uric acid   due 8 24 -Patient advised to return or notify health care team  if  new concerns arise.  There are no Patient Instructions on file for this visit.   Sevon Rotert K. Antonino Nienhuis M.D.

## 2023-05-26 ENCOUNTER — Ambulatory Visit (INDEPENDENT_AMBULATORY_CARE_PROVIDER_SITE_OTHER): Payer: Medicare HMO | Admitting: Internal Medicine

## 2023-05-26 ENCOUNTER — Encounter: Payer: Self-pay | Admitting: Internal Medicine

## 2023-05-26 ENCOUNTER — Other Ambulatory Visit: Payer: Self-pay | Admitting: Internal Medicine

## 2023-05-26 VITALS — BP 142/52 | HR 60 | Temp 98.2°F | Ht 60.0 in | Wt 277.0 lb

## 2023-05-26 DIAGNOSIS — E039 Hypothyroidism, unspecified: Secondary | ICD-10-CM

## 2023-05-26 DIAGNOSIS — G4733 Obstructive sleep apnea (adult) (pediatric): Secondary | ICD-10-CM

## 2023-05-26 DIAGNOSIS — E669 Obesity, unspecified: Secondary | ICD-10-CM

## 2023-05-26 DIAGNOSIS — I10 Essential (primary) hypertension: Secondary | ICD-10-CM

## 2023-05-26 DIAGNOSIS — E1169 Type 2 diabetes mellitus with other specified complication: Secondary | ICD-10-CM | POA: Diagnosis not present

## 2023-05-26 DIAGNOSIS — I350 Nonrheumatic aortic (valve) stenosis: Secondary | ICD-10-CM

## 2023-05-26 DIAGNOSIS — Z8739 Personal history of other diseases of the musculoskeletal system and connective tissue: Secondary | ICD-10-CM

## 2023-05-26 DIAGNOSIS — E785 Hyperlipidemia, unspecified: Secondary | ICD-10-CM

## 2023-05-26 DIAGNOSIS — Z79899 Other long term (current) drug therapy: Secondary | ICD-10-CM

## 2023-05-26 LAB — POCT GLYCOSYLATED HEMOGLOBIN (HGB A1C): Hemoglobin A1C: 6.9 % — AB (ref 4.0–5.6)

## 2023-05-26 NOTE — Progress Notes (Signed)
 Future orders  to b done before 3 mos fu visit

## 2023-05-26 NOTE — Patient Instructions (Addendum)
 A1c is down to 6.9   good   Look for  larger cuff size as we got 142/52 range  with large cuff on left arm and other was  172/74 If we wanted to adjust medication we might change losartan  to valsartan  that is somewhat stonger medication dosing.

## 2023-07-06 LAB — HM MAMMOGRAPHY

## 2023-07-14 ENCOUNTER — Other Ambulatory Visit: Payer: Self-pay | Admitting: Internal Medicine

## 2023-07-27 ENCOUNTER — Ambulatory Visit: Admitting: Podiatry

## 2023-07-27 DIAGNOSIS — M10071 Idiopathic gout, right ankle and foot: Secondary | ICD-10-CM | POA: Diagnosis not present

## 2023-07-27 MED ORDER — TRIAMCINOLONE ACETONIDE 10 MG/ML IJ SUSP
2.5000 mg | Freq: Once | INTRAMUSCULAR | Status: AC
  Administered 2023-07-27: 2.5 mg via INTRA_ARTICULAR

## 2023-07-27 MED ORDER — DEXAMETHASONE SODIUM PHOSPHATE 120 MG/30ML IJ SOLN
4.0000 mg | Freq: Once | INTRAMUSCULAR | Status: AC
  Administered 2023-07-27: 4 mg via INTRA_ARTICULAR

## 2023-07-27 NOTE — Progress Notes (Signed)
  Subjective:  Patient ID: Rebecca Rich, female    DOB: [birth year],   MRN: [id number]  Chief Complaint  Patient presents with   Gout    gout flare up right foot medial ankle and dorsal foot. Swelling x 1 week. 4 pain. Diet control. A1C 6.7. Topical pain cream and cold compress used.    78 y.o. female presents for concern as above. She has history of gout and has had injection in the past that has helped . Denies any other pedal complaints. Denies n/v/f/c.   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   INFECTION, 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 CFT <3 seconds. Normal hair growth on digits. No edema.  Skin. No lacerations or abrasions bilateral feet.  Musculoskeletal: MMT 5/5 bilateral lower extremities in DF, PF, Inversion and Eversion. Deceased ROM in DF of ankle joint. Right ankle swollen and edema mostly medial aspect. Tender to palpation and pain with ROM of the ankle.  Neurological: Sensation intact to light touch.    Assessment:   1. Acute idiopathic gout of right ankle      Plan:  Patient was evaluated and treated and all questions answered. -Discussed treatement options for gouty arthritis and gout education provided. -Patient opted for injection. After oral consent, injected right ankle with 1cc lidocaine  and marcaine  plain mixed with 1 cc Dexmethasone phosphate without complication; post injection care explained. -Discussed diet and modifications.  -Continue surgical shoe.  -Advised patient to call if symptoms are not improved within 1 week -Patient to return in 3 weeks for re-check/further discussion for long term management of gout or sooner if condition worsens.   Asberry Failing, DPM

## 2023-07-29 ENCOUNTER — Encounter: Payer: Self-pay | Admitting: Podiatry

## 2023-07-29 ENCOUNTER — Ambulatory Visit: Admitting: Podiatry

## 2023-07-29 DIAGNOSIS — B351 Tinea unguium: Secondary | ICD-10-CM

## 2023-07-29 DIAGNOSIS — M79609 Pain in unspecified limb: Secondary | ICD-10-CM | POA: Diagnosis not present

## 2023-07-29 DIAGNOSIS — E119 Type 2 diabetes mellitus without complications: Secondary | ICD-10-CM | POA: Diagnosis not present

## 2023-08-01 NOTE — Progress Notes (Signed)
  Subjective:  Patient ID: Rebecca Rich, female    DOB: November 19, 1945,  MRN: [id number]  78 y.o. female presents preventative diabetic foot care and painful mycotic toenails of both feet that are difficult to trim. Pain interferes with daily activities and wearing enclosed shoe gear comfortably. Chief Complaint  Patient presents with   [facility]    DFC A1C 6.9. Diet control diabetes. LOV with PCP 03/2023.   New problem(s): None   PCP is Panosh, Apolinar POUR, MD. No Known Allergies  Review of Systems: Negative except as noted in the HPI.   Objective:  Rebecca Rich is a pleasant 78 y.o. female in NAD. AAO x 3.  Vascular Examination: Vascular status intact b/l with palpable pedal pulses. CFT immediate b/l. Pedal hair present. No edema. No pain with calf compression b/l. Skin temperature gradient WNL b/l. No varicosities noted. No cyanosis or clubbing noted.  Neurological Examination: Sensation grossly intact b/l with 10 gram monofilament. Vibratory sensation intact b/l.  Dermatological Examination: Pedal skin with normal turgor, texture and tone b/l. No open wounds nor interdigital macerations noted. Toenails 1-5 b/l thick, discolored, elongated with subungual debris and pain on dorsal palpation. No hyperkeratotic lesions noted b/l.   Musculoskeletal Examination: Muscle strength 5/5 to b/l LE.  No pain, crepitus noted b/l. No gross pedal deformities. Patient ambulates independently without assistive aids.   Radiographs: None  Last A1c:      Latest Ref Rng & Units 05/26/2023   10:15 AM 01/27/2023   12:48 PM 09/02/2022   10:51 AM  Hemoglobin A1C  Hemoglobin-A1c 4.0 - 5.6 % 6.9  7.1  7.4      Assessment:   1. Pain due to onychomycosis of nail   2. Diabetes mellitus without complication (HCC)    Plan:  Consent given for treatment. Patient examined. All patient's and/or POA's questions/concerns addressed on today's visit. Mycotic toenails 1-5 debrided in length and girth without incident.  Continue foot and shoe inspections daily. Monitor blood glucose per PCP/Endocrinologist's recommendations.Continue soft, supportive shoe gear daily. Report any pedal injuries to medical professional. Call office if there are any quesitons/concerns. -Patient/POA to call should there be question/concern in the interim.  Return in about 3 months (around 10/29/2023).  Delon LITTIE Merlin, DPM      [place] LOCATION: [address] [place] [address]                   Office [phone]   [facility] LOCATION: [address] [place], [place] [address] Office [phone]

## 2023-08-20 ENCOUNTER — Other Ambulatory Visit (INDEPENDENT_AMBULATORY_CARE_PROVIDER_SITE_OTHER)

## 2023-08-20 DIAGNOSIS — I10 Essential (primary) hypertension: Secondary | ICD-10-CM

## 2023-08-20 DIAGNOSIS — E785 Hyperlipidemia, unspecified: Secondary | ICD-10-CM

## 2023-08-20 DIAGNOSIS — Z79899 Other long term (current) drug therapy: Secondary | ICD-10-CM | POA: Diagnosis not present

## 2023-08-20 DIAGNOSIS — Z8739 Personal history of other diseases of the musculoskeletal system and connective tissue: Secondary | ICD-10-CM

## 2023-08-20 DIAGNOSIS — E669 Obesity, unspecified: Secondary | ICD-10-CM

## 2023-08-20 DIAGNOSIS — E1169 Type 2 diabetes mellitus with other specified complication: Secondary | ICD-10-CM | POA: Diagnosis not present

## 2023-08-20 DIAGNOSIS — G4733 Obstructive sleep apnea (adult) (pediatric): Secondary | ICD-10-CM

## 2023-08-20 DIAGNOSIS — E039 Hypothyroidism, unspecified: Secondary | ICD-10-CM

## 2023-08-20 LAB — HEMOGLOBIN A1C: Hgb A1c MFr Bld: 7.4 % — ABNORMAL HIGH (ref 4.6–6.5)

## 2023-08-20 LAB — HEPATIC FUNCTION PANEL
ALT: 12 U/L (ref 0–35)
AST: 14 U/L (ref 0–37)
Albumin: 4 g/dL (ref 3.5–5.2)
Alkaline Phosphatase: 68 U/L (ref 39–117)
Bilirubin, Direct: 0.1 mg/dL (ref 0.0–0.3)
Total Bilirubin: 0.4 mg/dL (ref 0.2–1.2)
Total Protein: 7.1 g/dL (ref 6.0–8.3)

## 2023-08-20 LAB — BASIC METABOLIC PANEL WITH GFR
BUN: 23 mg/dL (ref 6–23)
CO2: 23 meq/L (ref 19–32)
Calcium: 9.3 mg/dL (ref 8.4–10.5)
Chloride: 107 meq/L (ref 96–112)
Creatinine, Ser: 1.03 mg/dL (ref 0.40–1.20)
GFR: 51.41 mL/min — ABNORMAL LOW (ref >60.00)
Glucose, Bld: 125 mg/dL — ABNORMAL HIGH (ref 70–99)
Potassium: 3.8 meq/L (ref 3.5–5.1)
Sodium: 139 meq/L (ref 135–145)

## 2023-08-20 LAB — CBC WITH DIFFERENTIAL/PLATELET
Basophils Absolute: 0 K/uL (ref 0.0–0.1)
Basophils Relative: 0.5 % (ref 0.0–3.0)
Eosinophils Absolute: 0.2 K/uL (ref 0.0–0.7)
Eosinophils Relative: 3.6 % (ref 0.0–5.0)
HCT: 36.7 % (ref 36.0–46.0)
Hemoglobin: 12.2 g/dL (ref 12.0–15.0)
Lymphocytes Relative: 34.4 % (ref 12.0–46.0)
Lymphs Abs: 1.9 K/uL (ref 0.7–4.0)
MCHC: 33.2 g/dL (ref 30.0–36.0)
MCV: 86.8 fl (ref 78.0–100.0)
Monocytes Absolute: 0.4 K/uL (ref 0.1–1.0)
Monocytes Relative: 6.6 % (ref 3.0–12.0)
Neutro Abs: 3 K/uL (ref 1.4–7.7)
Neutrophils Relative %: 54.9 % (ref 43.0–77.0)
Platelets: 165 K/uL (ref 150.0–400.0)
RBC: 4.23 Mil/uL (ref 3.87–5.11)
RDW: 14.6 % (ref 11.5–15.5)
WBC: 5.5 K/uL (ref 4.0–10.5)

## 2023-08-20 LAB — LIPID PANEL
Cholesterol: 108 mg/dL (ref 0–200)
HDL: 53.8 mg/dL (ref >39.00)
LDL Cholesterol: 35 mg/dL (ref 0–99)
NonHDL: 54.06
Total CHOL/HDL Ratio: 2
Triglycerides: 93 mg/dL (ref 0.0–149.0)
VLDL: 18.6 mg/dL (ref 0.0–40.0)

## 2023-08-20 LAB — TSH: TSH: 3.95 u[IU]/mL (ref 0.35–5.50)

## 2023-08-20 LAB — MICROALBUMIN / CREATININE URINE RATIO
Creatinine,U: 78.6 mg/dL
Microalb Creat Ratio: 26.9 mg/g (ref 0.0–30.0)
Microalb, Ur: 2.1 mg/dL — ABNORMAL HIGH (ref 0.0–1.9)

## 2023-08-20 LAB — URIC ACID: Uric Acid, Serum: 7.1 mg/dL — ABNORMAL HIGH (ref 2.4–7.0)

## 2023-08-20 LAB — VITAMIN B12: Vitamin B-12: 373 pg/mL (ref 211–911)

## 2023-08-25 ENCOUNTER — Encounter: Payer: Self-pay | Admitting: Internal Medicine

## 2023-08-25 ENCOUNTER — Ambulatory Visit: Payer: Self-pay | Admitting: Internal Medicine

## 2023-08-25 ENCOUNTER — Other Ambulatory Visit: Payer: Self-pay | Admitting: Internal Medicine

## 2023-08-25 ENCOUNTER — Ambulatory Visit: Admitting: Internal Medicine

## 2023-08-25 VITALS — BP 126/50 | HR 89 | Temp 98.5°F | Ht 60.0 in | Wt 260.2 lb

## 2023-08-25 DIAGNOSIS — E785 Hyperlipidemia, unspecified: Secondary | ICD-10-CM

## 2023-08-25 DIAGNOSIS — I10 Essential (primary) hypertension: Secondary | ICD-10-CM

## 2023-08-25 DIAGNOSIS — Z8739 Personal history of other diseases of the musculoskeletal system and connective tissue: Secondary | ICD-10-CM

## 2023-08-25 DIAGNOSIS — E1169 Type 2 diabetes mellitus with other specified complication: Secondary | ICD-10-CM | POA: Diagnosis not present

## 2023-08-25 DIAGNOSIS — E669 Obesity, unspecified: Secondary | ICD-10-CM | POA: Diagnosis not present

## 2023-08-25 DIAGNOSIS — Z79899 Other long term (current) drug therapy: Secondary | ICD-10-CM | POA: Diagnosis not present

## 2023-08-25 MED ORDER — LINAGLIPTIN 5 MG PO TABS: 5.0000 mg | ORAL_TABLET | Freq: Every day | ORAL | 3 refills | Status: AC

## 2023-08-25 NOTE — Patient Instructions (Signed)
 Good to see you today   Adding new medication  mild for diabetes control . Let uys know if  any problem that you cannot take and I can have Jon Lindau our pharmacist reach out  for help.  If recurrent gout attacks we should consider  uric acid lowering meds as discussed allopurinol and colchicine  suppression as needed.

## 2023-08-25 NOTE — Progress Notes (Signed)
 Chief Complaint  Patient presents with   Medical Management of Chronic Issues    3 months f/u.     HPI: ZUNAIRA LAMY 78 y.o. come in for Chronic disease management   Less activity  recently  lognistics  with water aerobics but ok otheriwise  No change in health otherwise  had labs   Gout  about once a year .   Not taking colchicine  but has some at  home   ROS: See pertinent positives and negatives per HPI. No new cp sob  swelling  neuro sx   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   INFECTION, 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   LIVER FUNCTION TESTS, ABNORMAL 07/28/2008   Morbid obesity (HCC) 12/08/2006   Nuclear sclerotic cataract of both eyes 03/26/2020   OBESITY 09/24/2009   OBSTRUCTIVE SLEEP APNEA 12/08/2006   OSTEOARTHRITIS 12/08/2006   RUQ PAIN 06/16/2008   SHOULDER PAIN, RIGHT 02/07/2008   Sleep apnea    Swelling of limb 07/28/2008   THYROID  FUNCTION TEST, ABNORMAL 12/08/2006    Family History  Problem Relation Age of Onset   Other Mother        blood clots   Cervical cancer Mother    Cancer Mother    Heart disease Brother    Diabetes Brother    Heart disease Sister    Diabetes Sister    Heart disease Brother    Diabetes Brother    Heart disease Brother    Diabetes Brother    Stroke Sister    Diabetes Sister    Throat cancer Father    Cancer Father    Diabetes Other        all siblings, 4 brothers, 5 sisters    Social  History   Socioeconomic History   Marital status: Divorced    Spouse name: Not on file   Number of children: Not on file   Years of education: Not on file   Highest education level: Bachelor's degree (e.g., BA, AB, BS)  Occupational History   Occupation: retired    Associate Professor: RETIRED  Tobacco Use   Smoking status: Never   Smokeless tobacco: Never  Vaping Use   Vaping status: Never Used  Substance and Sexual Activity   Alcohol use: No    Alcohol/week: 0.0 standard drinks of alcohol   Drug use: No   Sexual activity: Not on file  Other Topics Concern   Not on file  Social History Narrative   Master level education in math   Pt is currently retired   Pt is divorced with children   Recently had to move had a break in and thus away from her pool exercise   Social Drivers of Health   Financial Resource Strain: Low Risk  (04/03/2023)   Overall Financial Resource Strain (CARDIA)    Difficulty of Paying Living Expenses: Not hard at all  Food Insecurity: No Food Insecurity (04/03/2023)   Hunger Vital Sign  Worried About Programme researcher, broadcasting/film/video in the Last Year: Never true    Ran Out of Food in the Last Year: Never true  Transportation Needs: No Transportation Needs (04/03/2023)   PRAPARE - Administrator, Civil Service (Medical): No    Lack of Transportation (Non-Medical): No  Physical Activity: Insufficiently Active (04/03/2023)   Exercise Vital Sign    Days of Exercise per Week: 2 days    Minutes of Exercise per Session: 30 min  Stress: No Stress Concern Present (04/03/2023)   [employer] of Occupational Health - Occupational Stress Questionnaire    Feeling of Stress : Not at all  Recent Concern: Stress - Stress Concern Present (01/26/2023)   [employer] of Occupational Health - Occupational Stress Questionnaire    Feeling of Stress : To some extent  Social Connections: Moderately Integrated (04/03/2023)   Social Connection and Isolation Panel    Frequency of  Communication with Friends and Family: More than three times a week    Frequency of Social Gatherings with Friends and Family: More than three times a week    Attends Religious Services: More than 4 times per year    Active Member of [employer] or Organizations: Yes    Attends Engineer, structural: More than 4 times per year    Marital Status: Divorced    Outpatient Medications Prior to Visit  Medication Sig Dispense Refill   amLODipine  (NORVASC ) 10 MG tablet TAKE 1 TABLET BY MOUTH EVERY DAY 90 tablet 3   chlorthalidone  (HYGROTON ) 25 MG tablet TAKE 1 TABLET (25 MG TOTAL) BY MOUTH DAILY. 90 tablet 1   cholecalciferol (VITAMIN D) 1000 UNITS tablet Take 1,000 Units by mouth daily.     colchicine  0.6 MG tablet Take initial dose of 2 tablets tonight. Days 2-13, take one tablet per day. 15 tablet 0   glucose blood (ACCU-CHEK AVIVA PLUS) test strip Use to check blood sugar daily E11.9 100 each 12   Lancets (ACCU-CHEK SOFT TOUCH) lancets Use to test blood glucose twice daily 50 each 12   levothyroxine  (SYNTHROID ) 75 MCG tablet TAKE 1 TABLET BY MOUTH EVERY DAY BEFORE BREAKFAST 90 tablet 1   losartan  (COZAAR ) 100 MG tablet Take 1 tablet (100 mg total) by mouth daily. 90 tablet 1   metoprolol  succinate (TOPROL -XL) 25 MG 24 hr tablet TAKE 1 TABLET BY MOUTH EVERY DAY 90 tablet 2   Misc Natural Products (OSTEO BI-FLEX ADV JOINT SHIELD PO) Take 1 tablet by mouth daily.     potassium chloride  (KLOR-CON ) 10 MEQ tablet TAKE 1 TABLET (10 MEQ TOTAL) BY MOUTH 3 (THREE) TIMES DAILY. 90 tablet 5   rosuvastatin  (CRESTOR ) 5 MG tablet Take 1 tablet (5 mg total) by mouth daily. 90 tablet 2   No facility-administered medications prior to visit.     EXAM:  BP (!) 126/50 (BP Location: Left Arm, Patient Position: Sitting, Cuff Size: Large)   Pulse 89   Temp 98.5 F (36.9 C) (Oral)   Ht 5' (1.524 m)   Wt 260 lb 3.2 oz (118 kg)   SpO2 97%   BMI 50.82 kg/m   Body mass index is 50.82 kg/m. Cane use  ambulatory  Wt Readings from Last 3 Encounters:  08/25/23 260 lb 3.2 oz (118 kg)  05/26/23 277 lb (125.6 kg)  04/03/23 263 lb (119.3 kg)    GENERAL: vitals reviewed and listed above, alert, oriented, appears well hydrated and in no acute distress HEENT: atraumatic, conjunctiva  clear,  no obvious abnormalities on inspection of external nose and ears  NECK: no obvious masses on inspection palpation  LUNGS: clear to auscultation bilaterally, no wheezes, rales or rhonchi, CV: HRRR, 2/6 systolic murmur no clubbing cyanosis or  peripheral edema nl cap refill  MS: moves all extremities without noticeable focal  abnormality PSYCH: pleasant and cooperative, no obvious depression or anxiety Lab Results  Component Value Date   WBC 5.5 08/20/2023   HGB 12.2 08/20/2023   HCT 36.7 08/20/2023   PLT 165.0 08/20/2023   GLUCOSE 125 (H) 08/20/2023   CHOL 108 08/20/2023   TRIG 93.0 08/20/2023   HDL 53.80 08/20/2023   LDLCALC 35 08/20/2023   ALT 12 08/20/2023   AST 14 08/20/2023   NA 139 08/20/2023   K 3.8 08/20/2023   CL 107 08/20/2023   CREATININE 1.03 08/20/2023   BUN 23 08/20/2023   CO2 23 08/20/2023   TSH 3.95 08/20/2023   HGBA1C 7.4 (H) 08/20/2023   MICROALBUR 2.1 (H) 08/20/2023   BP Readings from Last 3 Encounters:  08/25/23 (!) 126/50  05/26/23 (!) 142/52  03/10/23 124/70   Lab Results  Component Value Date   LABURIC 7.1 (H) 08/20/2023   Lab results reviewed  ASSESSMENT AND PLAN:  Discussed the following assessment and plan:  Type 2 diabetes mellitus with obesity (HCC)  Medication management  Essential hypertension  Morbid obesity (HCC)  Hx of gout  Hyperlipidemia, unspecified hyperlipidemia type Dsic  options of jump of A1c to 7.4  she is cautious of med and declines metformin .  Will try  trajenta as mild add on if affordable class.  ( Consider glp1) Disc gout and elevated uric acid  at this time declines allopurinol suppression but consider if recurrent  a problem   LDl at goal  -Patient advised to return or notify health care team  if  new concerns arise.  Patient Instructions  Good to see you today   Adding new medication  mild for diabetes control . Let uys know if  any problem that you cannot take and I can have Jon Lindau our pharmacist reach out  for help.  If recurrent gout attacks we should consider  uric acid lowering meds as discussed allopurinol and colchicine  suppression as needed.   Axie Hayne K. Aliyana Dlugosz M.D.

## 2023-08-25 NOTE — Progress Notes (Signed)
 See office visit

## 2023-09-08 ENCOUNTER — Other Ambulatory Visit: Payer: Self-pay | Admitting: Internal Medicine

## 2023-10-06 ENCOUNTER — Other Ambulatory Visit: Payer: Self-pay | Admitting: Internal Medicine

## 2023-10-26 LAB — HM DIABETES EYE EXAM

## 2023-11-10 ENCOUNTER — Other Ambulatory Visit: Payer: Self-pay | Admitting: Internal Medicine

## 2023-11-11 ENCOUNTER — Encounter: Payer: Self-pay | Admitting: Podiatry

## 2023-11-11 ENCOUNTER — Ambulatory Visit: Admitting: Podiatry

## 2023-11-11 DIAGNOSIS — M79609 Pain in unspecified limb: Secondary | ICD-10-CM | POA: Diagnosis not present

## 2023-11-11 DIAGNOSIS — B351 Tinea unguium: Secondary | ICD-10-CM

## 2023-11-11 DIAGNOSIS — E119 Type 2 diabetes mellitus without complications: Secondary | ICD-10-CM | POA: Diagnosis not present

## 2023-11-16 ENCOUNTER — Encounter: Payer: Self-pay | Admitting: Podiatry

## 2023-11-16 NOTE — Progress Notes (Signed)
  Subjective:  Patient ID: Rebecca Rich, female    DOB: [birth year],  MRN: [id number]  ERABELLA KUIPERS presents to clinic today for preventative diabetic foot care for painful thick toenails that are difficult to trim. Pain interferes with ambulation. Aggravating factors include wearing enclosed shoe gear. Pain is relieved with periodic professional debridement.  Chief Complaint  Patient presents with   Diabetes    [facility] Diet control diabetes. A1C 7.4. LOV with PCP 08/25/23   New problem(s): None.   PCP is Panosh, Apolinar POUR, MD.  No Known Allergies  Review of Systems: Negative except as noted in the HPI.  Objective: No changes noted in today's physical examination. There were no vitals filed for this visit. Rebecca Rich is a pleasant 78 y.o. female in NAD. AAO x 3.  Vascular Examination: Capillary refill time immediate b/l. Palpable pedal pulses. Pedal hair present b/l. Pedal edema absent. No pain with calf compression b/l. Skin temperature gradient WNL b/l. No cyanosis or clubbing b/l. No ischemia or gangrene noted b/l.   Neurological Examination: Sensation grossly intact b/l with 10 gram monofilament. Vibratory sensation intact b/l.   Dermatological Examination: Pedal skin with normal turgor, texture and tone b/l.  No open wounds. No interdigital macerations.   Toenails 1-5 b/l thick, discolored, elongated with subungual debris and pain on dorsal palpation.   No corns, calluses, nor porokeratotic lesions.  Musculoskeletal Examination: Muscle strength 5/5 to all lower extremity muscle groups bilaterally. No pain, crepitus or joint limitation noted with ROM b/l LE. No gross bony pedal deformities b/l. Patient ambulates with cane assistance.  Radiographs: None  Last A1c:      Latest Ref Rng & Units 08/20/2023    8:49 AM 05/26/2023   10:15 AM 01/27/2023   12:48 PM  Hemoglobin A1C  Hemoglobin-A1c 4.6 - 6.5 % 7.4  6.9  7.1    Assessment/Plan: 1. Pain due to onychomycosis of  nail   2. Diabetes mellitus without complication [facility])   Patient was evaluated and treated. All patient's and/or POA's questions/concerns addressed on today's visit. Toenails 1-5 b/l debrided in length and girth without incident. Treatment was provided by assistant Andrez Manchester under my supervision. Continue foot and shoe inspections daily. Monitor blood glucose per PCP/Endocrinologist's recommendations. Continue soft, supportive shoe gear daily. Report any pedal injuries to medical professional. Call office if there are any questions/concerns. -Patient/POA to call should there be question/concern in the interim.   Return in about 3 months (around 02/11/2024).  Delon LITTIE Merlin, DPM      [place] LOCATION: [address] [place] [address]                   Office [phone]   [facility] LOCATION: [address] [place], [place] [address] Office [phone]

## 2023-11-25 ENCOUNTER — Ambulatory Visit: Admitting: Internal Medicine

## 2023-11-25 ENCOUNTER — Encounter: Payer: Self-pay | Admitting: Internal Medicine

## 2023-11-25 VITALS — BP 138/60 | HR 77 | Temp 97.4°F | Ht 60.0 in | Wt 263.4 lb

## 2023-11-25 DIAGNOSIS — Z79899 Other long term (current) drug therapy: Secondary | ICD-10-CM | POA: Diagnosis not present

## 2023-11-25 DIAGNOSIS — I10 Essential (primary) hypertension: Secondary | ICD-10-CM | POA: Diagnosis not present

## 2023-11-25 DIAGNOSIS — E1165 Type 2 diabetes mellitus with hyperglycemia: Secondary | ICD-10-CM | POA: Diagnosis not present

## 2023-11-25 LAB — POCT GLYCOSYLATED HEMOGLOBIN (HGB A1C): Hemoglobin A1C: 6.8 % — AB (ref 4.0–5.6)

## 2023-11-25 MED ORDER — CHLORTHALIDONE 25 MG PO TABS: 25.0000 mg | ORAL_TABLET | Freq: Every day | ORAL | 1 refills | Status: AC

## 2023-11-25 MED ORDER — LEVOTHYROXINE SODIUM 75 MCG PO TABS: ORAL_TABLET | ORAL | 1 refills | Status: AC

## 2023-11-25 NOTE — Patient Instructions (Addendum)
 Good to see  you today . A1c is much better 6.8   Continue lifestyle intervention healthy eating and exercise .   Plan to schedule a cardiology appt in the new year .  Last echo test was in 2023.   Chair  yoga  tai chi  is a good idea  fall prevention  and keep up muscle mass.

## 2023-11-25 NOTE — Progress Notes (Unsigned)
 Chief Complaint  Patient presents with   Medical Management of Chronic Issues    HPI: Rebecca Rich 78 y.o. come in for Chronic disease management   DM: never got the new medication trajenta  never heard  from any one  ....  so not taking .  Fmaily member now living with her. No falls  No cp sob syncope . Body movements slower  but began chair uoga .  Sees podiatry regularly   Ht last cards check ? When followed for as ao r dilitation  on b blocker   ROS: See pertinent positives and negatives per HPI.  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   INFECTION, 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   LIVER FUNCTION TESTS, ABNORMAL 07/28/2008   Morbid obesity (HCC) 12/08/2006   Nuclear sclerotic cataract of both eyes 03/26/2020   OBESITY 09/24/2009   OBSTRUCTIVE SLEEP APNEA 12/08/2006   OSTEOARTHRITIS 12/08/2006   RUQ PAIN 06/16/2008   SHOULDER PAIN, RIGHT 02/07/2008   Sleep apnea    Swelling of limb 07/28/2008   THYROID  FUNCTION TEST, ABNORMAL 12/08/2006    Family History  Problem Relation Age of Onset   Other Mother        blood clots   Cervical cancer Mother    Cancer Mother    Heart disease Brother    Diabetes Brother    Heart disease Sister    Diabetes Sister    Heart disease Brother    Diabetes Brother    Heart disease Brother    Diabetes Brother    Stroke Sister    Diabetes Sister    Throat cancer Father    Cancer Father    Diabetes  Other        all siblings, 4 brothers, 5 sisters    Social History   Socioeconomic History   Marital status: Divorced    Spouse name: Not on file   Number of children: Not on file   Years of education: Not on file   Highest education level: Bachelor's degree (e.g., BA, AB, BS)  Occupational History   Occupation: retired    Associate Professor: RETIRED  Tobacco Use   Smoking status: Never   Smokeless tobacco: Never  Vaping Use   Vaping status: Never Used  Substance and Sexual Activity   Alcohol use: No    Alcohol/week: 0.0 standard drinks of alcohol   Drug use: No   Sexual activity: Not on file  Other Topics Concern   Not on file  Social History Narrative   Master level education in math   Pt is currently retired   Pt is divorced with children   Recently had to move had a break in and thus away from her pool exercise   Social Drivers of Health   Financial Resource Strain: Low Risk  (04/03/2023)   Overall Financial Resource Strain (CARDIA)    Difficulty of Paying Living Expenses: Not hard at all  Food Insecurity: No  Food Insecurity (04/03/2023)   Hunger Vital Sign    Worried About Running Out of Food in the Last Year: Never true    Ran Out of Food in the Last Year: Never true  Transportation Needs: No Transportation Needs (04/03/2023)   PRAPARE - Administrator, Civil Service (Medical): No    Lack of Transportation (Non-Medical): No  Physical Activity: Insufficiently Active (04/03/2023)   Exercise Vital Sign    Days of Exercise per Week: 2 days    Minutes of Exercise per Session: 30 min  Stress: No Stress Concern Present (04/03/2023)   [employer] of Occupational Health - Occupational Stress Questionnaire    Feeling of Stress : Not at all  Recent Concern: Stress - Stress Concern Present (01/26/2023)   [employer] of Occupational Health - Occupational Stress Questionnaire    Feeling of Stress : To some extent  Social Connections: Moderately Integrated (04/03/2023)    Social Connection and Isolation Panel    Frequency of Communication with Friends and Family: More than three times a week    Frequency of Social Gatherings with Friends and Family: More than three times a week    Attends Religious Services: More than 4 times per year    Active Member of [employer] or Organizations: Yes    Attends Engineer, Structural: More than 4 times per year    Marital Status: Divorced    Outpatient Medications Prior to Visit  Medication Sig Dispense Refill   amLODipine  (NORVASC ) 10 MG tablet TAKE 1 TABLET BY MOUTH EVERY DAY 90 tablet 0   cholecalciferol (VITAMIN D) 1000 UNITS tablet Take 1,000 Units by mouth daily.     glucose blood (ACCU-CHEK AVIVA PLUS) test strip Use to check blood sugar daily E11.9 100 each 12   Lancets (ACCU-CHEK SOFT TOUCH) lancets Use to test blood glucose twice daily 50 each 12   linagliptin  (TRADJENTA ) 5 MG TABS tablet Take 1 tablet (5 mg total) by mouth daily. For diabetes 30 tablet 3   losartan  (COZAAR ) 100 MG tablet TAKE 1 TABLET BY MOUTH EVERY DAY 90 tablet 1   metoprolol  succinate (TOPROL -XL) 25 MG 24 hr tablet TAKE 1 TABLET BY MOUTH EVERY DAY 90 tablet 0   Misc Natural Products (OSTEO BI-FLEX ADV JOINT SHIELD PO) Take 1 tablet by mouth daily.     potassium chloride  (KLOR-CON ) 10 MEQ tablet TAKE 1 TABLET (10 MEQ TOTAL) BY MOUTH 3 (THREE) TIMES DAILY. 90 tablet 5   rosuvastatin  (CRESTOR ) 5 MG tablet Take 1 tablet (5 mg total) by mouth daily. 90 tablet 2   chlorthalidone  (HYGROTON ) 25 MG tablet TAKE 1 TABLET (25 MG TOTAL) BY MOUTH DAILY. 90 tablet 1   levothyroxine  (SYNTHROID ) 75 MCG tablet TAKE 1 TABLET BY MOUTH EVERY DAY BEFORE BREAKFAST 90 tablet 1   colchicine  0.6 MG tablet Take initial dose of 2 tablets tonight. Days 2-13, take one tablet per day. (Patient not taking: Reported on 11/11/2023) 15 tablet 0   No facility-administered medications prior to visit.     EXAM:  BP 138/60 (BP Location: Right Arm, Patient Position:  Sitting, Cuff Size: Large)   Pulse 77   Temp (!) 97.4 F (36.3 C) (Oral)   Ht 5' (1.524 m)   Wt 263 lb 6.4 oz (119.5 kg)   SpO2 97%   BMI 51.44 kg/m   Body mass index is 51.44 kg/m.  GENERAL: vitals reviewed and listed above, alert, oriented, appears well hydrated and in no acute distress  HEENT: atraumatic, conjunctiva  clear, no obvious abnormalities on inspection of external nose and ears  NECK: no obvious masses on inspection palpation  LUNGS: clear to auscultation bilaterally, no wheezes, rales or rhonchi, good air movement CV: HRRR, no clubbing cyanosis or  peripheral edema nl cap refill  MS: moves all extremities without noticeable focal  abnormality uses cane feet  scaly at hellps no lesion  PSYCH: pleasant and cooperative, no obvious depression or anxiety Lab Results  Component Value Date   WBC 5.5 08/20/2023   HGB 12.2 08/20/2023   HCT 36.7 08/20/2023   PLT 165.0 08/20/2023   GLUCOSE 125 (H) 08/20/2023   CHOL 108 08/20/2023   TRIG 93.0 08/20/2023   HDL 53.80 08/20/2023   LDLCALC 35 08/20/2023   ALT 12 08/20/2023   AST 14 08/20/2023   NA 139 08/20/2023   K 3.8 08/20/2023   CL 107 08/20/2023   CREATININE 1.03 08/20/2023   BUN 23 08/20/2023   CO2 23 08/20/2023   TSH 3.95 08/20/2023   HGBA1C 6.8 (A) 11/25/2023   MICROALBUR 2.1 (H) 08/20/2023   BP Readings from Last 3 Encounters:  11/25/23 138/60  08/25/23 (!) 126/50  05/26/23 (!) 142/52    ASSESSMENT AND PLAN:  Discussed the following assessment and plan:  Type 2 diabetes mellitus with hyperglycemia, without long-term current use of insulin  (HCC) - Plan: POC HgB A1c  Morbid obesity (HCC)  Medication management  Essential hypertension Improved  A1c with lsi   Bp stable  Has as and no sx but  may be dur for fu cards echo etc  has been over 2 years   she will make appt outreach .  Ok to cont  for now and rov in 3 mos  or as indicated  Refill meds today  After patient left  Patient says she wasn't  taking the tradjenta ? But maybe she is? Still on the list  Will address at fu either way she is doing better   Record review med eval counsel about next steps  and fu   30 minutes  -Patient advised to return or notify health care team  if  new concerns arise.  Patient Instructions  Good to see  you today . A1c is much better 6.8   Continue lifestyle intervention healthy eating and exercise .   Plan to schedule a cardiology appt in the new year .  Last echo test was in 2023.   Chair  yoga  tai chi  is a good idea  fall prevention  and keep up muscle mass.      Kiela Shisler K. Xcaret Morad M.D.

## 2024-01-03 ENCOUNTER — Other Ambulatory Visit: Payer: Self-pay | Admitting: Internal Medicine

## 2024-02-01 ENCOUNTER — Other Ambulatory Visit: Payer: Self-pay | Admitting: Internal Medicine

## 2024-02-16 ENCOUNTER — Encounter (HOSPITAL_BASED_OUTPATIENT_CLINIC_OR_DEPARTMENT_OTHER): Payer: Self-pay | Admitting: Pulmonary Disease

## 2024-02-21 ENCOUNTER — Other Ambulatory Visit: Payer: Self-pay | Admitting: Internal Medicine

## 2024-02-24 ENCOUNTER — Ambulatory Visit: Admitting: Internal Medicine

## 2024-02-24 ENCOUNTER — Ambulatory Visit: Admitting: Podiatry

## 2024-03-01 ENCOUNTER — Encounter: Payer: Self-pay | Admitting: Podiatry

## 2024-03-01 ENCOUNTER — Ambulatory Visit: Payer: Self-pay | Admitting: Podiatry

## 2024-03-01 DIAGNOSIS — M2141 Flat foot [pes planus] (acquired), right foot: Secondary | ICD-10-CM

## 2024-03-01 DIAGNOSIS — E119 Type 2 diabetes mellitus without complications: Secondary | ICD-10-CM

## 2024-03-01 DIAGNOSIS — B351 Tinea unguium: Secondary | ICD-10-CM

## 2024-03-01 NOTE — Progress Notes (Signed)
"  °  Subjective:  Patient ID: Rebecca Rich, female    DOB: 12-14-1945,  MRN: [id number]  Rebecca Rich presents to clinic today for for annual diabetic foot examination and preventative diabetic foot care for painful mycotic toenails of both feet that are difficult to trim. Pain interferes with daily activities and wearing enclosed shoe gear comfortably.  Chief Complaint  Patient presents with   Diabetes    My regular toenail clipping. Saw Dr. Charlett - 11/25/2023; A1C 6.8   New problem(s): None.   PCP is Rebecca Rich, Rebecca POUR, Rebecca Rich.  Allergies[1]  Review of Systems: Negative except as noted in the HPI.  Objective: No changes noted in today's physical examination. There were no vitals filed for this visit. Rebecca Rich is a pleasant 79 y.o. female in NAD. AAO x 3.   Diabetic foot exam was performed with the following findings:   Vascular Examination: Capillary refill time immediate b/l. Vascular status intact b/l with palpable pedal pulses. Pedal hair present b/l. No pain with calf compression b/l. Skin temperature gradient WNL b/l. No cyanosis or clubbing b/l. No ischemia or gangrene noted b/l. Nonpitting edema noted b/l lower extremities.  Neurological Examination: Sensation grossly intact b/l with 10 gram monofilament. Vibratory sensation intact b/l.   Dermatological Examination: Pedal skin with normal turgor, texture and tone b/l.  No open wounds. No interdigital macerations.   Toenails 1-5 b/l thick, discolored, elongated with subungual debris and pain on dorsal palpation.   No hyperkeratotic nor porokeratotic lesions  Musculoskeletal Examination: Muscle strength 5/5 to all lower extremity muscle groups bilaterally. No pain, crepitus or joint limitation noted with ROM bilateral LE. Pes planus deformity noted bilateral LE. Utilizes cane for ambulation assistance.  Radiographs: None    Assessment/Plan: 1. Pain due to onychomycosis of nail   2. Pes planus of both feet   3.  Diabetes mellitus without complication (HCC)   4. Encounter for diabetic foot exam [facility])   Consent given for treatment. Diabetic foot examination performed.. All patient's and/or POA's questions/concerns addressed on today's visit. Mycotic toenails 1-5 b/l debrided in length and girth without incident. Continue foot and shoe inspections daily. Monitor blood glucose per PCP/Endocrinologist's recommendations.Continue soft, supportive shoe gear daily. Report any pedal injuries to medical professional. Call office if there are any quesitons/concerns. -Patient/POA to call should there be question/concern in the interim.   Return in about 3 months (around 05/29/2024).  Rebecca Rich, DPM      [place] LOCATION: [address], [place] [address]                   Office [phone]   [facility] LOCATION: [address] [place], [place] [address] Office [phone]      [1] No Known Allergies  "

## 2024-03-03 ENCOUNTER — Ambulatory Visit: Admitting: Internal Medicine

## 2024-03-03 ENCOUNTER — Ambulatory Visit

## 2024-03-09 ENCOUNTER — Ambulatory Visit: Admitting: Internal Medicine

## 2024-03-14 ENCOUNTER — Ambulatory Visit: Admitting: Primary Care

## 2024-03-23 ENCOUNTER — Ambulatory Visit: Admitting: Physician Assistant

## 2024-04-08 ENCOUNTER — Ambulatory Visit

## 2024-06-07 ENCOUNTER — Ambulatory Visit: Admitting: Podiatry
